# Patient Record
Sex: Female | Born: 1955 | Race: Black or African American | Hispanic: No | Marital: Single | Smoking: Former smoker
Health system: Southern US, Community
[De-identification: ages and names within clinical notes are randomized; demographics above are authoritative.]

## PROBLEM LIST (undated history)

## (undated) DIAGNOSIS — E119 Type 2 diabetes mellitus without complications: Secondary | ICD-10-CM

## (undated) DIAGNOSIS — I639 Cerebral infarction, unspecified: Secondary | ICD-10-CM

## (undated) DIAGNOSIS — D649 Anemia, unspecified: Secondary | ICD-10-CM

## (undated) DIAGNOSIS — I10 Essential (primary) hypertension: Secondary | ICD-10-CM

## (undated) HISTORY — PX: KNEE SURGERY: SHX244

## (undated) HISTORY — PX: OTHER SURGICAL HISTORY: SHX169

---

## 2001-09-30 ENCOUNTER — Emergency Department (HOSPITAL_COMMUNITY): Admission: EM | Admit: 2001-09-30 | Discharge: 2001-09-30 | Payer: Self-pay | Admitting: Emergency Medicine

## 2002-03-18 ENCOUNTER — Ambulatory Visit (HOSPITAL_COMMUNITY): Admission: RE | Admit: 2002-03-18 | Discharge: 2002-03-18 | Payer: Self-pay | Admitting: Family Medicine

## 2002-03-18 ENCOUNTER — Encounter: Payer: Self-pay | Admitting: Family Medicine

## 2004-01-31 ENCOUNTER — Emergency Department (HOSPITAL_COMMUNITY): Admission: EM | Admit: 2004-01-31 | Discharge: 2004-01-31 | Payer: Self-pay | Admitting: Emergency Medicine

## 2004-03-17 ENCOUNTER — Emergency Department (HOSPITAL_COMMUNITY): Admission: EM | Admit: 2004-03-17 | Discharge: 2004-03-17 | Payer: Self-pay | Admitting: Emergency Medicine

## 2004-08-01 ENCOUNTER — Emergency Department (HOSPITAL_COMMUNITY): Admission: EM | Admit: 2004-08-01 | Discharge: 2004-08-01 | Payer: Self-pay | Admitting: Emergency Medicine

## 2004-12-26 ENCOUNTER — Emergency Department (HOSPITAL_COMMUNITY): Admission: EM | Admit: 2004-12-26 | Discharge: 2004-12-26 | Payer: Self-pay | Admitting: *Deleted

## 2005-05-08 ENCOUNTER — Inpatient Hospital Stay (HOSPITAL_COMMUNITY): Admission: EM | Admit: 2005-05-08 | Discharge: 2005-05-10 | Payer: Self-pay | Admitting: Emergency Medicine

## 2006-12-09 ENCOUNTER — Emergency Department (HOSPITAL_COMMUNITY): Admission: EM | Admit: 2006-12-09 | Discharge: 2006-12-09 | Payer: Self-pay | Admitting: Emergency Medicine

## 2010-11-05 ENCOUNTER — Encounter: Payer: Self-pay | Admitting: Internal Medicine

## 2011-07-27 ENCOUNTER — Encounter: Payer: Self-pay | Admitting: *Deleted

## 2011-07-27 ENCOUNTER — Emergency Department (HOSPITAL_COMMUNITY): Payer: Self-pay

## 2011-07-27 ENCOUNTER — Emergency Department (HOSPITAL_COMMUNITY)
Admission: EM | Admit: 2011-07-27 | Discharge: 2011-07-27 | Disposition: A | Discharge status: Home / Self Care | Payer: Self-pay | Attending: Emergency Medicine | Admitting: Emergency Medicine

## 2011-07-27 DIAGNOSIS — I10 Essential (primary) hypertension: Secondary | ICD-10-CM | POA: Insufficient documentation

## 2011-07-27 DIAGNOSIS — F17200 Nicotine dependence, unspecified, uncomplicated: Secondary | ICD-10-CM | POA: Insufficient documentation

## 2011-07-27 DIAGNOSIS — M25569 Pain in unspecified knee: Secondary | ICD-10-CM | POA: Insufficient documentation

## 2011-07-27 DIAGNOSIS — M1990 Unspecified osteoarthritis, unspecified site: Secondary | ICD-10-CM

## 2011-07-27 DIAGNOSIS — M1710 Unilateral primary osteoarthritis, unspecified knee: Secondary | ICD-10-CM | POA: Insufficient documentation

## 2011-07-27 DIAGNOSIS — M25469 Effusion, unspecified knee: Secondary | ICD-10-CM | POA: Insufficient documentation

## 2011-07-27 HISTORY — DX: Anemia, unspecified: D64.9

## 2011-07-27 HISTORY — DX: Essential (primary) hypertension: I10

## 2011-07-27 MED ORDER — OXYCODONE-ACETAMINOPHEN 5-325 MG PO TABS: 1.0000 | ORAL_TABLET | ORAL | Status: AC | PRN

## 2011-07-27 MED ORDER — PREDNISONE (PAK) 10 MG PO TABS: ORAL_TABLET | ORAL | Status: AC

## 2011-07-27 NOTE — ED Provider Notes (Signed)
History   This chart was scribed for Flint Melter, MD by Clarita Crane. The patient was seen in room APA17/APA17 and the patient's care was started at 2:46PM.   CSN: [id number] Arrival date & time: 07/27/2011 12:57 PM  Chief Complaint  Patient presents with  . Knee Pain    right    (Consider location/radiation/quality/duration/timing/severity/associated sxs/prior treatment) HPI Brandi [name] is a 55 y.o. female who presents to the Emergency Department complaining of constant right knee pain to lateral aspect of knee onset 4 days ago and persistent since. Patient also notes she has episodes in which her "knee cap comes out of place" and her knee will suddenly "catch" with immediate onset of pain. Patient reports h/o right knee surgery performed by Dr. Hilda Lias several years ago. Patient also with h/o anemia and hypertension.    Past Medical History  Diagnosis Date  . Anemia   . Hypertension     Past Surgical History  Procedure Date  . Knee surgery right knee  . Cyst removed from right breast     History reviewed. No pertinent family history.  History  Substance Use Topics  . Smoking status: Current Everyday Smoker  . Smokeless tobacco: Not on file  . Alcohol Use: Yes    OB History    Grav Para Term Preterm Abortions TAB SAB Ect Mult Living                  Review of Systems 10 Systems reviewed and are negative for acute change except as noted in the HPI.  Allergies  Codeine  Home Medications   Current Outpatient Rx  Name Route Sig Dispense Refill  . HYDROGEN PEROXIDE 3 % EX SOLN Both Ears Place 1 application into both ears as needed. For itching     . OXYCODONE-ACETAMINOPHEN 5-325 MG PO TABS Oral Take 1 tablet by mouth every 4 (four) hours as needed for pain. 20 tablet 0  . PREDNISONE (PAK) 10 MG PO TABS  Take in decreasing daily dose 6, 5, 4, 3, 2, 1 21 tablet 0    BP 197/110  Pulse 87  Temp(Src) 98.6 F (37 C) (Oral)  Resp 20  Ht 5' 6.5" (1.689  m)  Wt 267 lb (121.11 kg)  BMI 42.45 kg/m2  SpO2 100%  Physical Exam  Nursing note and vitals reviewed. Constitutional: She is oriented to person, place, and time. She appears well-developed and well-nourished. No distress.  HENT:  Head: Normocephalic and atraumatic.  Eyes: EOM are normal. Pupils are equal, round, and reactive to light.  Neck: Neck supple. No tracheal deviation present.  Cardiovascular: Normal rate.   Pulmonary/Chest: Effort normal. No respiratory distress.  Abdominal: She exhibits no distension.  Musculoskeletal: Normal range of motion. She exhibits tenderness. She exhibits no edema.       Small effusion of right knee with mild diffuse swelling. Tenderness to palpation over lateral aspect of right knee. Right knee stable.   Neurological: She is alert and oriented to person, place, and time. No sensory deficit.  Skin: Skin is warm and dry.  Psychiatric: She has a normal mood and affect. Her behavior is normal.    ED Course  Procedures (including critical care time)  DIAGNOSTIC STUDIES: Oxygen Saturation is 100% on room air, normal by my interpretation.    COORDINATION OF CARE: 2:50PM- Patient informed of imaging results and probable cause of right knee pain. Intent to d/c home. Patient agrees with plan set forth at this time.  Labs Reviewed - No data to display Dg Knee Complete 4 Views Right  07/27/2011  *RADIOLOGY REPORT*  Clinical Data: Knee pain  RIGHT KNEE - COMPLETE 4+ VIEW  Comparison: None.  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 Laterally there is loss of joint space, sclerosis, and considerable spur formation.  There does appear to be a small knee joint effusion present.  No fracture is seen.  No definite loose body is noted.  IMPRESSION: Tricompartmental degenerative joint disease.  Probable small effusion.  No acute fracture.  Original Report Authenticated By: Juline Patch, M.D.        MDM  Nonspecific knee pain with arthritis. Stable for d/c.      I personally performed the services described in this documentation, which was scribed in my presence. The recorded information has been reviewed and considered.     Flint Melter, MD 07/27/11 1723

## 2011-07-27 NOTE — ED Notes (Signed)
Pt c/o right knee pain x 5 days; pt states her "knee keeps slipping"

## 2011-07-27 NOTE — ED Notes (Signed)
Knee Immobilizer placed on right knee. Pt a/ox4. Resp even and unlabored. NAD at this time. Pt escorted to lobby via w/c. Pt waiting on friend for transport home.

## 2011-10-16 DIAGNOSIS — I639 Cerebral infarction, unspecified: Secondary | ICD-10-CM

## 2011-10-16 HISTORY — DX: Cerebral infarction, unspecified: I63.9

## 2012-02-04 ENCOUNTER — Emergency Department (HOSPITAL_COMMUNITY)
Admission: EM | Admit: 2012-02-04 | Discharge: 2012-02-04 | Disposition: A | Discharge status: Home / Self Care | Payer: Medicaid Other | Attending: Emergency Medicine | Admitting: Emergency Medicine

## 2012-02-04 ENCOUNTER — Encounter (HOSPITAL_COMMUNITY): Payer: Self-pay | Admitting: *Deleted

## 2012-02-04 DIAGNOSIS — R062 Wheezing: Secondary | ICD-10-CM

## 2012-02-04 DIAGNOSIS — J45909 Unspecified asthma, uncomplicated: Secondary | ICD-10-CM | POA: Insufficient documentation

## 2012-02-04 DIAGNOSIS — F17200 Nicotine dependence, unspecified, uncomplicated: Secondary | ICD-10-CM | POA: Insufficient documentation

## 2012-02-04 DIAGNOSIS — I10 Essential (primary) hypertension: Secondary | ICD-10-CM

## 2012-02-04 DIAGNOSIS — M25569 Pain in unspecified knee: Secondary | ICD-10-CM

## 2012-02-04 DIAGNOSIS — R0602 Shortness of breath: Secondary | ICD-10-CM | POA: Insufficient documentation

## 2012-02-04 DIAGNOSIS — R059 Cough, unspecified: Secondary | ICD-10-CM | POA: Insufficient documentation

## 2012-02-04 DIAGNOSIS — R05 Cough: Secondary | ICD-10-CM | POA: Insufficient documentation

## 2012-02-04 MED ORDER — METOPROLOL TARTRATE 100 MG PO TABS: 100.0000 mg | ORAL_TABLET | Freq: Two times a day (BID) | ORAL | Status: DC

## 2012-02-04 MED ORDER — ALBUTEROL SULFATE HFA 108 (90 BASE) MCG/ACT IN AERS: 1.0000 | INHALATION_SPRAY | Freq: Four times a day (QID) | RESPIRATORY_TRACT | Status: DC | PRN

## 2012-02-04 MED ORDER — SODIUM CHLORIDE 0.9 % IN NEBU
INHALATION_SOLUTION | RESPIRATORY_TRACT | Status: AC
  Administered 2012-02-04: 3 mL
  Filled 2012-02-04: qty 3

## 2012-02-04 MED ORDER — ALBUTEROL SULFATE (5 MG/ML) 0.5% IN NEBU
5.0000 mg | INHALATION_SOLUTION | Freq: Once | RESPIRATORY_TRACT | Status: AC
  Administered 2012-02-04: 5 mg via RESPIRATORY_TRACT
  Filled 2012-02-04: qty 1

## 2012-02-04 MED ORDER — HYDROCHLOROTHIAZIDE 25 MG PO TABS: 25.0000 mg | ORAL_TABLET | Freq: Every day | ORAL | Status: DC

## 2012-02-04 MED ORDER — PREDNISONE 10 MG PO TABS: 20.0000 mg | ORAL_TABLET | Freq: Every day | ORAL | Status: DC

## 2012-02-04 MED ORDER — PREDNISONE 20 MG PO TABS
60.0000 mg | ORAL_TABLET | Freq: Once | ORAL | Status: AC
  Administered 2012-02-04: 60 mg via ORAL
  Filled 2012-02-04: qty 3

## 2012-02-04 MED ORDER — HYDROCODONE-ACETAMINOPHEN 5-325 MG PO TABS: 1.0000 | ORAL_TABLET | ORAL | Status: AC | PRN

## 2012-02-04 NOTE — ED Notes (Signed)
Pt. Discharged to home via w/c, NAD noted, resp. Even and regular

## 2012-02-04 NOTE — ED Provider Notes (Signed)
History   This chart was scribed for [employer]. Colon Branch, MD by Brooks Sailors. The patient was seen in room APA18/APA18. Patient's care was started at 0721.   CSN: [id number]  Arrival date & time 02/04/12  4696   First MD Initiated Contact with Patient 02/04/12 [phone]      Chief Complaint  Patient presents with  . Cough  . Joint Swelling    right knee  . Wheezing    (Consider location/radiation/quality/duration/timing/severity/associated sxs/prior treatment) HPI  Brandi [name] is a 56 y.o. female who presents to the Emergency Department complaining of constant moderate to severe SOB with onset one week ago with associated wheezing, congestion, and non-productive cough. Patient notes symptoms were resolving until severe episode of SOB and wheezing yesterday due to pollen. Patient with history of bronchitis and current everyday smoker. Patient also notes she has chronic high blood pressure and right knee pain. Patient says right knee is loose and cannot ambulate well.    Past Medical History  Diagnosis Date  . Anemia   . Hypertension     Past Surgical History  Procedure Date  . Knee surgery right knee  . Cyst removed from right breast     History reviewed. No pertinent family history.  History  Substance Use Topics  . Smoking status: Current Everyday Smoker  . Smokeless tobacco: Not on file  . Alcohol Use: Yes    OB History    Grav Para Term Preterm Abortions TAB SAB Ect Mult Living                  Review of Systems  All other systems reviewed and are negative.    Allergies  Codeine  Home Medications   Current Outpatient Rx  Name Route Sig Dispense Refill  . HYDROGEN PEROXIDE 3 % EX SOLN Both Ears Place 1 application into both ears as needed. For itching       BP 222/106  Pulse 88  Temp(Src) 98.3 F (36.8 C) (Oral)  Resp 20  Ht 5\' 6"  (1.676 m)  Wt 275 lb (124.739 kg)  BMI 44.39 kg/m2  SpO2 98%  Physical Exam  Nursing note and vitals  reviewed. Constitutional: She is oriented to person, place, and time. She appears well-developed and well-nourished.       obese  HENT:  Head: Normocephalic and atraumatic.  Mouth/Throat: Oropharynx is clear and moist.  Eyes: Conjunctivae and EOM are normal. Pupils are equal, round, and reactive to light.  Neck: Normal range of motion. Neck supple.  Cardiovascular: Normal rate, regular rhythm and normal heart sounds.   Pulmonary/Chest: Effort normal. No respiratory distress. She has wheezes (bilateral, occasional.). She has no rales.  Abdominal: Soft. Bowel sounds are normal.  Musculoskeletal: Normal range of motion.       History consistent right with patellar laxity.   Neurological: She is alert and oriented to person, place, and time.  Skin: Skin is warm and dry.  Psychiatric: She has a normal mood and affect.    ED Course  Procedures (including critical care time) DIAGNOSTIC STUDIES: Oxygen Saturation is 98% on room air, normal by my interpretation.     Date: 02/04/2012  0800  Rate: 80  Rhythm: normal sinus rhythm  QRS Axis: normal  Intervals: normal  ST/T Wave abnormalities: nonspecific ST/T changes  Conduction Disutrbances:none  Narrative Interpretation:   Old EKG Reviewed: none available  COORDINATION OF CARE: 8:10AM Patient being giving prednisone and breathing treatment, also inhaler. Patient given contact  for orthopedist for right knee.  9:00AM Patient notes feeling much better after breathing treatment.     MDM  Patient with history of asthma here with shortness of breath in the last 2 days that coincides with an increase in pollen. She denies fever or chills or productive cough. She also has c/o hypertension and right knee pain. She does not have a primary care doctor. Will provide beginning antihypertensives, inhaler, prednisone and analgesic. Pt feels improved after observation and/or treatment in ED.Pt stable in ED with no significant deterioration in  condition.The patient appears reasonably screened and/or stabilized for discharge and I doubt any other medical condition or other [facility] requiring further screening, evaluation, or treatment in the ED at this time prior to discharge.  I personally performed the services described in this documentation, which was scribed in my presence. The recorded information has been reviewed and considered.   MDM Reviewed: nursing note and vitals Interpretation: ECG           Nicoletta Dress. Colon Branch, MD 02/04/12 [phone]

## 2012-02-04 NOTE — ED Notes (Signed)
Received pt. From waiting room via w/c, pt. Alert and oriented, C/O SOB, Wheezing, Cough and right knee pain

## 2012-02-04 NOTE — Discharge Instructions (Signed)
Find a primary care doctor. Use the pain medicine for your knee. Follow up with an orthopedist if needed. One is listed on the paperwork for you. For the wheezing, use the inhaler as directed. Take all of the prednisone. Take the blood pressure medicine as directed.

## 2012-02-04 NOTE — ED Notes (Signed)
Resp. Therapist at the bedside, Neb Tx. given

## 2012-06-23 ENCOUNTER — Inpatient Hospital Stay (HOSPITAL_COMMUNITY)
Admission: EM | Admit: 2012-06-23 | Discharge: 2012-06-25 | DRG: 065 | Disposition: A | Discharge status: Home / Self Care | Payer: Self-pay | Attending: Internal Medicine | Admitting: Internal Medicine

## 2012-06-23 ENCOUNTER — Encounter (HOSPITAL_COMMUNITY): Payer: Self-pay | Admitting: *Deleted

## 2012-06-23 ENCOUNTER — Emergency Department (HOSPITAL_COMMUNITY): Payer: Self-pay

## 2012-06-23 ENCOUNTER — Inpatient Hospital Stay (HOSPITAL_COMMUNITY): Payer: Self-pay

## 2012-06-23 DIAGNOSIS — E669 Obesity, unspecified: Secondary | ICD-10-CM | POA: Diagnosis present

## 2012-06-23 DIAGNOSIS — Z23 Encounter for immunization: Secondary | ICD-10-CM

## 2012-06-23 DIAGNOSIS — I639 Cerebral infarction, unspecified: Secondary | ICD-10-CM | POA: Diagnosis present

## 2012-06-23 DIAGNOSIS — D649 Anemia, unspecified: Secondary | ICD-10-CM | POA: Diagnosis present

## 2012-06-23 DIAGNOSIS — Z6841 Body Mass Index (BMI) 40.0 and over, adult: Secondary | ICD-10-CM

## 2012-06-23 DIAGNOSIS — I6350 Cerebral infarction due to unspecified occlusion or stenosis of unspecified cerebral artery: Principal | ICD-10-CM

## 2012-06-23 DIAGNOSIS — R4701 Aphasia: Secondary | ICD-10-CM | POA: Diagnosis present

## 2012-06-23 DIAGNOSIS — F17200 Nicotine dependence, unspecified, uncomplicated: Secondary | ICD-10-CM | POA: Diagnosis present

## 2012-06-23 DIAGNOSIS — Z720 Tobacco use: Secondary | ICD-10-CM | POA: Diagnosis present

## 2012-06-23 DIAGNOSIS — I10 Essential (primary) hypertension: Secondary | ICD-10-CM | POA: Diagnosis present

## 2012-06-23 DIAGNOSIS — Z79899 Other long term (current) drug therapy: Secondary | ICD-10-CM

## 2012-06-23 LAB — CBC WITH DIFFERENTIAL/PLATELET
Basophils Absolute: 0 10*3/uL (ref 0.0–0.1)
Eosinophils Absolute: 0 10*3/uL (ref 0.0–0.7)
Eosinophils Relative: 1 % (ref 0–5)
MCH: 28.8 pg (ref 26.0–34.0)
MCV: 84.7 fL (ref 78.0–100.0)
Platelets: 301 10*3/uL (ref 150–400)
RDW: 15.7 % — ABNORMAL HIGH (ref 11.5–15.5)
WBC: 6.2 10*3/uL (ref 4.0–10.5)

## 2012-06-23 LAB — BASIC METABOLIC PANEL
Calcium: 9.9 mg/dL (ref 8.4–10.5)
GFR calc Af Amer: 70 mL/min — ABNORMAL LOW (ref >90)
GFR calc non Af Amer: 60 mL/min — ABNORMAL LOW (ref >90)
Sodium: 136 mEq/L (ref 135–145)

## 2012-06-23 MED ORDER — ASPIRIN 325 MG PO TABS
325.0000 mg | ORAL_TABLET | Freq: Every day | ORAL | Status: DC
  Administered 2012-06-24 – 2012-06-25 (×2): 325 mg via ORAL
  Filled 2012-06-23 (×2): qty 1

## 2012-06-23 MED ORDER — ASPIRIN 81 MG PO CHEW
324.0000 mg | CHEWABLE_TABLET | Freq: Once | ORAL | Status: AC
  Administered 2012-06-23: 324 mg via ORAL
  Filled 2012-06-23: qty 4

## 2012-06-23 MED ORDER — ACETAMINOPHEN 325 MG PO TABS: 650.0000 mg | ORAL_TABLET | ORAL | Status: DC | PRN

## 2012-06-23 MED ORDER — PNEUMOCOCCAL VAC POLYVALENT 25 MCG/0.5ML IJ INJ
0.5000 mL | INJECTION | INTRAMUSCULAR | Status: AC
  Administered 2012-06-24: 0.5 mL via INTRAMUSCULAR
  Filled 2012-06-23: qty 0.5

## 2012-06-23 MED ORDER — ONDANSETRON HCL 4 MG/2ML IJ SOLN: 4.0000 mg | Freq: Four times a day (QID) | INTRAMUSCULAR | Status: DC | PRN

## 2012-06-23 MED ORDER — ENOXAPARIN SODIUM 40 MG/0.4ML ~~LOC~~ SOLN: 40.0000 mg | SUBCUTANEOUS | Status: DC

## 2012-06-23 MED ORDER — SODIUM CHLORIDE 0.9 % IJ SOLN
INTRAMUSCULAR | Status: AC
  Administered 2012-06-23: 10 mL
  Filled 2012-06-23: qty 3

## 2012-06-23 MED ORDER — SODIUM CHLORIDE 0.9 % IV SOLN
INTRAVENOUS | Status: DC
  Administered 2012-06-23 – 2012-06-24 (×2): via INTRAVENOUS

## 2012-06-23 MED ORDER — METOPROLOL TARTRATE 50 MG PO TABS
100.0000 mg | ORAL_TABLET | Freq: Two times a day (BID) | ORAL | Status: DC
  Administered 2012-06-23 – 2012-06-25 (×4): 100 mg via ORAL
  Filled 2012-06-23 (×4): qty 2

## 2012-06-23 MED ORDER — HYDROCHLOROTHIAZIDE 25 MG PO TABS
25.0000 mg | ORAL_TABLET | Freq: Every day | ORAL | Status: DC
  Administered 2012-06-24 – 2012-06-25 (×2): 25 mg via ORAL
  Filled 2012-06-23 (×2): qty 1

## 2012-06-23 MED ORDER — ASPIRIN 300 MG RE SUPP
300.0000 mg | Freq: Every day | RECTAL | Status: DC
  Filled 2012-06-23 (×5): qty 1

## 2012-06-23 MED ORDER — SENNOSIDES-DOCUSATE SODIUM 8.6-50 MG PO TABS: 1.0000 | ORAL_TABLET | Freq: Every evening | ORAL | Status: DC | PRN

## 2012-06-23 MED ORDER — ALBUTEROL SULFATE HFA 108 (90 BASE) MCG/ACT IN AERS: 1.0000 | INHALATION_SPRAY | Freq: Four times a day (QID) | RESPIRATORY_TRACT | Status: DC | PRN

## 2012-06-23 MED ORDER — ACETAMINOPHEN 650 MG RE SUPP: 650.0000 mg | RECTAL | Status: DC | PRN

## 2012-06-23 MED ORDER — ENOXAPARIN SODIUM 40 MG/0.4ML ~~LOC~~ SOLN
40.0000 mg | SUBCUTANEOUS | Status: DC
  Administered 2012-06-23 – 2012-06-24 (×2): 40 mg via SUBCUTANEOUS
  Filled 2012-06-23 (×2): qty 0.4

## 2012-06-23 NOTE — H&P (Signed)
[facility] History and Physical  Brandi [name] WUJ:[id number] DOB: Apr 12, 1956 DOA: 06/23/2012  Referring physician: Dr. Bebe Shaggy PCP: No primary provider on file.   Chief Complaint: difficulty forming sentences  HPI: Brandi [name] is a 56 y.o. female  History of hypertension, obesity, tobacco abuse who was brought to the emergency room by her family with difficulty speech. According to her family they had noticed approximately 4 days ago the patient was having difficulty forming her sentences. He also had noticed some slurring of her speech. Over the past 4 days her symptoms had started to improve. Today a family member/friend had commented that patient may be having a mild facial droop. This prompted the patient's ER visit. She denies any unilateral weakness or numbness. No changes in vision. No chest pain or shortness of breath. No fever or cough. Her main complaint is expressive aphasic. She is an active smoker. She does not take any antiplatelet agents. She does not have a regular primary care physician. In the ER she was evaluated MRI of the brain showed acute CVA. She's been referred for admission  Review of Systems: The patient denies anorexia, fever, weight loss,, vision loss, decreased hearing, hoarseness, chest pain, syncope, dyspnea on exertion, peripheral edema, balance deficits, hemoptysis, abdominal pain, melena, hematochezia, severe indigestion/heartburn, hematuria, incontinence, genital sores, muscle weakness, suspicious skin lesions, transient blindness, difficulty walking, depression, unusual weight change, abnormal bleeding, enlarged lymph nodes, angioedema, and breast masses.    Past Medical History  Diagnosis Date  . Anemia   . Hypertension    Past Surgical History  Procedure Date  . Knee surgery right knee  . Cyst removed from right breast    Social History:  reports that she has been smoking.  She does not have any smokeless tobacco history on file. She  reports that she drinks alcohol. She reports that she uses illicit drugs (Marijuana). Patient lives at home with family and is independent  Allergies  Allergen Reactions  . Codeine     Unknown     Family history: Mother had congestive heart failure in her 81s. Father had MI in his 54s. No history of premature strokes   Prior to Admission medications   Medication Sig Start Date End Date Taking? Authorizing Provider  albuterol (PROVENTIL HFA;VENTOLIN HFA) 108 (90 BASE) MCG/ACT inhaler Inhale 1-2 puffs into the lungs every 6 (six) hours as needed for wheezing. 02/04/12 02/03/13 Yes Nicoletta Dress. Colon Branch, MD  hydrochlorothiazide (HYDRODIURIL) 25 MG tablet Take 1 tablet (25 mg total) by mouth daily. 02/04/12 02/03/13 Yes Terry S. Colon Branch, MD  metoprolol (LOPRESSOR) 100 MG tablet Take 1 tablet (100 mg total) by mouth 2 (two) times daily. 02/04/12 02/03/13 Yes Terry S. Colon Branch, MD   Physical Exam: Filed Vitals:   06/23/12 1522 06/23/12 1654 06/23/12 1738 06/23/12 1755  BP: 134/85 177/102  164/84  Pulse: 67 71    Temp:   98.4 F (36.9 C) 98.4 F (36.9 C)  TempSrc:    Oral  Resp: 22 18  20   Height:      Weight:      SpO2: 96% 100%  99%     General:  No acute distress  Eyes: Pupils are equal round react to light and accommodation  ENT: No pharyngeal erythema, mucous members are moist  Neck: Supple  Cardiovascular: S1, S2, regular rate and rhythm  Respiratory: Clear to auscultation bilaterally  Abdomen: Soft, nontender, nondistended, bowel sounds are active  Skin: No visible rashes  Musculoskeletal: Deferred  Psychiatric: Normal affect, cooperative with exam  Neurologic: Cranial nerves II through XII are grossly intact, strength is 5 out of 5 in the upper and lower extremities. No visible facial asymmetry. Speech appears to be improving  Labs on Admission:  Basic Metabolic Panel:  Lab 06/23/12 1610  NA 136  K 3.4*  CL 95*  CO2 28  GLUCOSE 148*  BUN 15  CREATININE 1.02    CALCIUM 9.9  MG --  PHOS --   Liver Function Tests: No results found for this basename: AST:5,ALT:5,ALKPHOS:5,BILITOT:5,PROT:5,ALBUMIN:5 in the last 168 hours No results found for this basename: LIPASE:5,AMYLASE:5 in the last 168 hours No results found for this basename: AMMONIA:5 in the last 168 hours CBC:  Lab 06/23/12 1325  WBC 6.2  NEUTROABS 3.5  HGB 17.3*  HCT 50.9*  MCV 84.7  PLT 301   Cardiac Enzymes: No results found for this basename: CKTOTAL:5,CKMB:5,CKMBINDEX:5,TROPONINI:5 in the last 168 hours  BNP (last 3 results) No results found for this basename: PROBNP:3 in the last 8760 hours CBG: No results found for this basename: GLUCAP:5 in the last 168 hours  Radiological Exams on Admission: Ct Head Wo Contrast  06/23/2012  *RADIOLOGY REPORT*  Clinical Data: Slurred speech.  Facial droop.  Left leg tingling.  CT HEAD WITHOUT CONTRAST  Technique:  Contiguous axial images were obtained from the base of the skull through the vertex without contrast.  Comparison: None.  Findings: The brain does not show generalized atrophy.  The brainstem and cerebellum are unremarkable.  The cerebral hemispheres are normal except for focal low density in the left frontoparietal cortical and subcortical brain most consistent with an old infarction.  No identifiably acute infarction, mass lesion, hemorrhage, hydrocephalus or extra-axial collection.  No calvarial abnormality.  Sinuses, middle ears and mastoids are clear.  IMPRESSION: No identifiably acute infarction.  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   Original Report Authenticated By: Thomasenia Sales, M.D.    Mr Brain Wo Contrast  06/23/2012  *RADIOLOGY REPORT*  Clinical Data: Slurred speech.  Confusion.  Left lower extremity numbness.  Hypertension.  MRI HEAD WITHOUT CONTRAST  Technique:  Multiplanar, multiecho pulse sequences of the brain and surrounding structures were obtained according to standard  protocol without intravenous contrast.  Comparison: 06/23/2012 head CT.  No comparison MR.  Findings: The patient is claustrophobic.  Fast technique imaging had to be utilized.  T1-weighted axial sequence was not obtained.  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  This involves portions of the left frontal lobe, left parietal lobe, left opercular and sub insular region and left temporal lobe.  Remote infarct at the left frontal - parietal junction.  Small vessel disease type changes.  No intracranial hemorrhage.  Major intracranial vascular structures are patent.  Limited evaluation of left middle cerebral artery branches on the present technique. The patient may eventually benefit from MR angiogram circle Willis.  Mild exophthalmos.  No intracranial mass lesion detected on this unenhanced exam.  No hydrocephalus.  Right frontal calvarial 1.2 cm lesion may represent a hemangioma. Stability can be confirmed on follow-up.  IMPRESSION:  The patient is claustrophobic.  Fast technique imaging had to be utilized.  T1-weighted axial sequence was not obtained.  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  This involves portions of the left frontal lobe, left parietal lobe, left opercular and sub insular region and left temporal lobe.  Remote infarct at the left frontal - parietal junction.  Small vessel disease type changes.  No intracranial hemorrhage.  Major intracranial vascular structures are patent.  Limited evaluation of left middle cerebral artery branches on the present technique. The patient may eventually benefit from MR angiogram circle Willis.  Right frontal calvarial 1.2 cm lesion may represent a hemangioma. Stability can be confirmed on follow-up.  Critical Value/emergent results were called by telephone at the time of interpretation on 06/23/2012 at 5:00 p.m. to Dr. Bebe Shaggy, who verbally acknowledged these results.   Original Report Authenticated  By: Fuller Canada, M.D.     EKG: Independently reviewed. No acute ST or T changes  Assessment/Plan Principal Problem:  *CVA (cerebral vascular accident) Active Problems:  HTN (hypertension)  Obesity  Expressive aphasia  Tobacco abuse   1. Acute CVA. Patient will be admitted to a telemetry bed to complete her workup. She'll undergo MRA of the brain, carotid Dopplers and 2-D echocardiogram. We'll also check hemoglobin A1c and lipid panel. We'll request a neurology consultation. We'll start her on aspirin since she was not taking any antiplatelet agent prior to the stroke. She'll have physical therapy evaluations. She has been counseled regarding lifestyle changes to modify risk factors. 2. Hypertension. Continue metoprolol. Can add second agent if she's remains uncontrolled 3. Tobacco abuse. Counseled on the importance of cessation. She appears to understand. 4. Obesity. Counseled on lifestyle changes  Code Status: Full code Family Communication: Discussed with patient and multiple family members at the bedside Disposition Plan: Discharge home once workup is complete  Time spent: 55 minutes  MEMON,JEHANZEB [facility] Pager [phone]  If 7PM-7AM, please contact night-coverage [email or web address] Password [facility] 06/23/2012, 6:40 PM

## 2012-06-23 NOTE — ED Notes (Signed)
Report called to

## 2012-06-23 NOTE — ED Notes (Signed)
Returned to cardiac monitor; placed on O2 2L/min per [place].

## 2012-06-23 NOTE — ED Notes (Signed)
Pt given mouth swab for dry mouth, pt advised about NPO status due to possible stroke.

## 2012-06-23 NOTE — ED Notes (Signed)
Rt facial droop, lt leg 'tingles".  Onset on Thursday.  Alert,   "fumbling my words"

## 2012-06-23 NOTE — ED Provider Notes (Signed)
History      CSN: [id number]  Arrival date & time 06/23/12  1131   First MD Initiated Contact with Patient 06/23/12 1241      No chief complaint on file.   (Consider location/radiation/quality/duration/timing/severity/associated sxs/prior treatment) The history is provided by the patient. No language interpreter was used.  chief complaint slurred speech........ History of present illness: Slurred speech and fumbling for words since Thursday. Also some numbness and tingling in the left lower extremity. Review of systems positive for diaphoresis. No chest pain, headache, neck pain. Nothing makes symptoms better or worse. Severity is moderate.past medical history positive for hypertension  Past Medical History  Diagnosis Date  . Anemia   . Hypertension     Past Surgical History  Procedure Date  . Knee surgery right knee  . Cyst removed from right breast     History reviewed. No pertinent family history.  History  Substance Use Topics  . Smoking status: Current Everyday Smoker  . Smokeless tobacco: Not on file  . Alcohol Use: Yes    OB History    Grav Para Term Preterm Abortions TAB SAB Ect Mult Living                  Review of Systems  All other systems reviewed and are negative.    Allergies  Codeine  Home Medications   Current Outpatient Rx  Name Route Sig Dispense Refill  . ALBUTEROL SULFATE HFA 108 (90 BASE) MCG/ACT IN AERS Inhalation Inhale 1-2 puffs into the lungs every 6 (six) hours as needed for wheezing. 1 Inhaler 0  . HYDROCHLOROTHIAZIDE 25 MG PO TABS Oral Take 1 tablet (25 mg total) by mouth daily. 30 tablet 6  . METOPROLOL TARTRATE 100 MG PO TABS Oral Take 1 tablet (100 mg total) by mouth 2 (two) times daily. 60 tablet 6    BP 175/73  Pulse 70  Temp 98.2 F (36.8 C) (Oral)  Resp 18  Ht 5\' 6"  (1.676 m)  Wt 280 lb (127.007 kg)  BMI 45.19 kg/m2  SpO2 98%  Physical Exam  Nursing note and vitals reviewed. Constitutional: She is oriented to  person, place, and time. She appears well-developed and well-nourished.       Obese,  Slight delay in answering questions. Does not appear confused  HENT:  Head: Normocephalic and atraumatic.  Eyes: Conjunctivae and EOM are normal. Pupils are equal, round, and reactive to light.  Neck: Normal range of motion. Neck supple.  Cardiovascular: Normal rate, regular rhythm and normal heart sounds.   Pulmonary/Chest: Effort normal and breath sounds normal.  Abdominal: Soft. Bowel sounds are normal.  Musculoskeletal: Normal range of motion.  Neurological: She is alert and oriented to person, place, and time.       Moving all 4 extremities  Skin: Skin is warm and dry.  Psychiatric: She has a normal mood and affect.    ED Course  Procedures (including critical care time) DIAGNOSTIC STUDIES:  COORDINATION OF CARE:    Labs Reviewed - No data to display No results found.   No diagnosis found. Results for orders placed during the hospital encounter of 06/23/12  CBC WITH DIFFERENTIAL      Component Value Range   WBC 6.2  4.0 - 10.5 K/uL   RBC 6.01 (*) 3.87 - 5.11 MIL/uL   Hemoglobin 17.3 (*) 12.0 - 15.0 g/dL   HCT 78.2 (*) 95.6 - 21.3 %   MCV 84.7  78.0 - 100.0 fL  MCH 28.8  26.0 - 34.0 pg   MCHC 34.0  30.0 - 36.0 g/dL   RDW 16.1 (*) 09.6 - 04.5 %   Platelets 301  150 - 400 K/uL   Neutrophils Relative 56  43 - 77 %   Neutro Abs 3.5  1.7 - 7.7 K/uL   Lymphocytes Relative 34  12 - 46 %   Lymphs Abs 2.1  0.7 - 4.0 K/uL   Monocytes Relative 10  3 - 12 %   Monocytes Absolute 0.6  0.1 - 1.0 K/uL   Eosinophils Relative 1  0 - 5 %   Eosinophils Absolute 0.0  0.0 - 0.7 K/uL   Basophils Relative 0  0 - 1 %   Basophils Absolute 0.0  0.0 - 0.1 K/uL  BASIC METABOLIC PANEL      Component Value Range   Sodium 136  135 - 145 mEq/L   Potassium 3.4 (*) 3.5 - 5.1 mEq/L   Chloride 95 (*) 96 - 112 mEq/L   CO2 28  19 - 32 mEq/L   Glucose, Bld 148 (*) 70 - 99 mg/dL   BUN 15  6 - 23 mg/dL    Creatinine, Ser 4.09  0.50 - 1.10 mg/dL   Calcium 9.9  8.4 - 81.1 mg/dL   GFR calc non Af Amer 60 (*) >90 mL/min   GFR calc Af Amer 70 (*) >90 mL/min  Ct Head Wo Contrast  06/23/2012  *RADIOLOGY REPORT*  Clinical Data: Slurred speech.  Facial droop.  Left leg tingling.  CT HEAD WITHOUT CONTRAST  Technique:  Contiguous axial images were obtained from the base of the skull through the vertex without contrast.  Comparison: None.  Findings: The brain does not show generalized atrophy.  The brainstem and cerebellum are unremarkable.  The cerebral hemispheres are normal except for focal low density in the left frontoparietal cortical and subcortical brain most consistent with an old infarction.  No identifiably acute infarction, mass lesion, hemorrhage, hydrocephalus or extra-axial collection.  No calvarial abnormality.  Sinuses, middle ears and mastoids are clear.  IMPRESSION: No identifiably acute infarction.  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   Original Report Authenticated By: Thomasenia Sales, M.D.      MDM  CT scan shows old infarction in the left frontal parietal cortical and subcortical brain.  Will obtain MRI for further differentiation.  Discussed with Dr. Army Fossa, MD 06/23/12 [phone]

## 2012-06-23 NOTE — ED Notes (Addendum)
Reports left-sided facial drooping and slurred speech x 4 days.  C/o "sweating a lot".  Reports diarrhea x 3 episodes yesterday.  Hx of hypertension, but states, "I don't take my medicine like I'm supposed to.". Pt is alert, answers questions appropriately.  No slurred speech or facial drooping noted; denies pain.

## 2012-06-23 NOTE — ED Provider Notes (Addendum)
4:37 PM  i received patient in signout to f/u mri Currently in MRI Labs/ekg reviewed BP 134/85  Pulse 67  Temp 98.2 F (36.8 C) (Oral)  Resp 22  Ht 5\' 6"  (1.676 m)  Wt 280 lb (127.007 kg)  BMI 45.19 kg/m2  SpO2 96%  5:19 PM Mri results noted Pt tells me she has had difficulty speaking for several days Swallow screen ordered She has mild expressive aphasia Will admit [facility] ordered (after swallow screen)  tPA in stroke considered but not given due to:  Onset over 3-4.5hours     Joya Gaskins, MD 06/23/12 1720   Date: 06/23/2012  Rate: 60  Rhythm: normal sinus rhythm  QRS Axis: left  Intervals: normal  ST/T Wave abnormalities: inverted t waves  Conduction Disutrbances:none  Narrative Interpretation:   Old EKG Reviewed: unchanged    Joya Gaskins, MD 06/23/12 1758

## 2012-06-23 NOTE — Plan of Care (Signed)
Problem: Phase I Progression Outcomes Goal: Strict NPO til swallow screen done Outcome: Completed/Met Date Met:  06/23/12 passed

## 2012-06-24 ENCOUNTER — Inpatient Hospital Stay (HOSPITAL_COMMUNITY): Payer: Self-pay

## 2012-06-24 LAB — CBC
HCT: 49.5 % — ABNORMAL HIGH (ref 36.0–46.0)
Hemoglobin: 16.9 g/dL — ABNORMAL HIGH (ref 12.0–15.0)
MCH: 28.8 pg (ref 26.0–34.0)
MCV: 84.3 fL (ref 78.0–100.0)
RBC: 5.87 MIL/uL — ABNORMAL HIGH (ref 3.87–5.11)

## 2012-06-24 LAB — HEMOGLOBIN A1C
Hgb A1c MFr Bld: 5.7 % — ABNORMAL HIGH (ref <5.7)
Mean Plasma Glucose: 117 mg/dL — ABNORMAL HIGH (ref <117)

## 2012-06-24 LAB — BASIC METABOLIC PANEL
CO2: 26 mEq/L (ref 19–32)
Glucose, Bld: 111 mg/dL — ABNORMAL HIGH (ref 70–99)
Potassium: 3.1 mEq/L — ABNORMAL LOW (ref 3.5–5.1)
Sodium: 136 mEq/L (ref 135–145)

## 2012-06-24 LAB — LIPID PANEL: Cholesterol: 195 mg/dL (ref 0–200)

## 2012-06-24 LAB — TSH: TSH: 0.664 u[IU]/mL (ref 0.350–4.500)

## 2012-06-24 MED ORDER — ASPIRIN 325 MG PO TABS: 325.0000 mg | ORAL_TABLET | Freq: Every day | ORAL | Status: AC

## 2012-06-24 MED ORDER — CLOPIDOGREL BISULFATE 75 MG PO TABS
75.0000 mg | ORAL_TABLET | Freq: Every day | ORAL | Status: DC
  Administered 2012-06-24 – 2012-06-25 (×2): 75 mg via ORAL
  Filled 2012-06-24 (×2): qty 1

## 2012-06-24 MED ORDER — SIMVASTATIN 20 MG PO TABS
20.0000 mg | ORAL_TABLET | Freq: Every day | ORAL | Status: DC
  Administered 2012-06-24: 20 mg via ORAL
  Filled 2012-06-24: qty 1

## 2012-06-24 MED ORDER — POTASSIUM CHLORIDE CRYS ER 20 MEQ PO TBCR
40.0000 meq | EXTENDED_RELEASE_TABLET | Freq: Once | ORAL | Status: AC
  Administered 2012-06-24: 40 meq via ORAL
  Filled 2012-06-24: qty 2

## 2012-06-24 MED ORDER — METOPROLOL TARTRATE 100 MG PO TABS: 100.0000 mg | ORAL_TABLET | Freq: Two times a day (BID) | ORAL | Status: DC

## 2012-06-24 MED ORDER — SIMVASTATIN 20 MG PO TABS: 20.0000 mg | ORAL_TABLET | Freq: Every day | ORAL | Status: DC

## 2012-06-24 MED ORDER — LORAZEPAM 2 MG/ML IJ SOLN: 1.0000 mg | Freq: Once | INTRAMUSCULAR | Status: DC

## 2012-06-24 NOTE — Plan of Care (Signed)
Problem: Phase II Progression Outcomes Goal: Neuro status stablized/improved Outcome: Completed/Met Date Met:  06/24/12 Pt's speech now clear.

## 2012-06-24 NOTE — Progress Notes (Addendum)
[facility] PROGRESS NOTE  Brandi [name] ZOX:[id number] DOB: 07-05-1956 DOA: 06/23/2012 PCP: No primary provider on file.  Assessment/Plan: Principal Problem:  *CVA (cerebral vascular accident) Active Problems:  HTN (hypertension)  Obesity  Expressive aphasia  Tobacco abuse  1. Acute CVA. Patient is on aspirin therapy. Neurology input appreciated. Workup is currently in progress Will add statin for hyperlipidemia. Blood pressure better controlled. Patient was counseled extensively lifestyle changes. 2. Hypertension. Improved with outpatient regimen. 3. Hyperlipidemia. Patient has an elevated LDL and will be started on statin. 4. Disposition. Once workup is complete, patient can be discharged home.  Code Status: Full code Family Communication: Discussed with patient at bedside Disposition Plan: Discharge home once workup is complete   Brief narrative: This lady presents to the emergency room with expressive aphasia. She was having symptoms for about 3-4 days prior to admission. MRI of the brain in the emergency room revealed a thrombotic infarct. Patient was admitted for further treatments   Consultants:  Neurology  Procedures:  None  Antibiotics:  None  HPI/Subjective: Feeling better today, still feel she has a mild expressive bases. No other complaints  Objective: Filed Vitals:   06/23/12 2331 06/24/12 0122 06/24/12 0352 06/24/12 0501  BP: 153/82 116/64 130/82 149/68  Pulse: 55 64 62 59  Temp: 98 F (36.7 C) 97.7 F (36.5 C) 98 F (36.7 C) 97.4 F (36.3 C)  TempSrc: Oral Oral Oral Oral  Resp: 20 20 20 20   Height:      Weight:      SpO2: 96% 97% 96% 100%    Intake/Output Summary (Last 24 hours) at 06/24/12 1025 Last data filed at 06/24/12 0844  Gross per 24 hour  Intake 1082.5 ml  Output      0 ml  Net 1082.5 ml   Filed Weights   06/23/12 1136 06/23/12 2016  Weight: 127.007 kg (280 lb) 147.97 kg (326 lb 3.4 oz)    Exam:   General:  No  acute distress  Cardiovascular: S1, S2, regular rate and rhythm  Respiratory: Clear to auscultation bilaterally  Abdomen: Soft, nontender, nondistended, bowel sounds are active  Data Reviewed: Basic Metabolic Panel:  Lab 06/24/12 8119 06/23/12 1325  NA 136 136  K 3.1* 3.4*  CL 97 95*  CO2 26 28  GLUCOSE 111* 148*  BUN 15 15  CREATININE 1.00 1.02  CALCIUM 9.5 9.9  MG -- --  PHOS -- --   Liver Function Tests: No results found for this basename: AST:5,ALT:5,ALKPHOS:5,BILITOT:5,PROT:5,ALBUMIN:5 in the last 168 hours No results found for this basename: LIPASE:5,AMYLASE:5 in the last 168 hours No results found for this basename: AMMONIA:5 in the last 168 hours CBC:  Lab 06/24/12 0446 06/23/12 1325  WBC 6.3 6.2  NEUTROABS -- 3.5  HGB 16.9* 17.3*  HCT 49.5* 50.9*  MCV 84.3 84.7  PLT 284 301   Cardiac Enzymes: No results found for this basename: CKTOTAL:5,CKMB:5,CKMBINDEX:5,TROPONINI:5 in the last 168 hours BNP (last 3 results) No results found for this basename: PROBNP:3 in the last 8760 hours CBG: No results found for this basename: GLUCAP:5 in the last 168 hours  No results found for this or any previous visit (from the past 240 hour(s)).   Studies: Dg Chest 2 View  06/23/2012  *RADIOLOGY REPORT*  Clinical Data: Slurred speech, stroke, history of smoking  CHEST - 2 VIEW  Comparison: None.  Findings: The lungs are clear but hyperaerated.  Mediastinal contours appear normal.  The heart is within upper limits normal. There  are degenerative changes throughout the thoracic spine.  IMPRESSION: No active lung disease.  Hyperaeration.   Original Report Authenticated By: Juline Patch, M.D.    Ct Head Wo Contrast  06/23/2012  *RADIOLOGY REPORT*  Clinical Data: Slurred speech.  Facial droop.  Left leg tingling.  CT HEAD WITHOUT CONTRAST  Technique:  Contiguous axial images were obtained from the base of the skull through the vertex without contrast.  Comparison: None.  Findings: The  brain does not show generalized atrophy.  The brainstem and cerebellum are unremarkable.  The cerebral hemispheres are normal except for focal low density in the left frontoparietal cortical and subcortical brain most consistent with an old infarction.  No identifiably acute infarction, mass lesion, hemorrhage, hydrocephalus or extra-axial collection.  No calvarial abnormality.  Sinuses, middle ears and mastoids are clear.  IMPRESSION: No identifiably acute infarction.  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   Original Report Authenticated By: Thomasenia Sales, M.D.    Mr Brain Wo Contrast  06/23/2012  *RADIOLOGY REPORT*  Clinical Data: Slurred speech.  Confusion.  Left lower extremity numbness.  Hypertension.  MRI HEAD WITHOUT CONTRAST  Technique:  Multiplanar, multiecho pulse sequences of the brain and surrounding structures were obtained according to standard protocol without intravenous contrast.  Comparison: 06/23/2012 head CT.  No comparison MR.  Findings: The patient is claustrophobic.  Fast technique imaging had to be utilized.  T1-weighted axial sequence was not obtained.  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  This involves portions of the left frontal lobe, left parietal lobe, left opercular and sub insular region and left temporal lobe.  Remote infarct at the left frontal - parietal junction.  Small vessel disease type changes.  No intracranial hemorrhage.  Major intracranial vascular structures are patent.  Limited evaluation of left middle cerebral artery branches on the present technique. The patient may eventually benefit from MR angiogram circle Willis.  Mild exophthalmos.  No intracranial mass lesion detected on this unenhanced exam.  No hydrocephalus.  Right frontal calvarial 1.2 cm lesion may represent a hemangioma. Stability can be confirmed on follow-up.  IMPRESSION:  The patient is claustrophobic.  Fast technique  imaging had to be utilized.  T1-weighted axial sequence was not obtained.  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  This involves portions of the left frontal lobe, left parietal lobe, left opercular and sub insular region and left temporal lobe.  Remote infarct at the left frontal - parietal junction.  Small vessel disease type changes.  No intracranial hemorrhage.  Major intracranial vascular structures are patent.  Limited evaluation of left middle cerebral artery branches on the present technique. The patient may eventually benefit from MR angiogram circle Willis.  Right frontal calvarial 1.2 cm lesion may represent a hemangioma. Stability can be confirmed on follow-up.  Critical Value/emergent results were called by telephone at the time of interpretation on 06/23/2012 at 5:00 p.m. to Dr. Bebe Shaggy, who verbally acknowledged these results.   Original Report Authenticated By: Fuller Canada, M.D.     Scheduled Meds:   . aspirin  324 mg Oral Once  . aspirin  300 mg Rectal Daily   Or  . aspirin  325 mg Oral Daily  . enoxaparin  40 mg Subcutaneous Q24H  . hydrochlorothiazide  25 mg Oral Daily  . metoprolol  100 mg Oral BID  . pneumococcal 23 valent vaccine  0.5 mL Intramuscular Tomorrow-1000  . sodium chloride      .  DISCONTD: enoxaparin  40 mg Subcutaneous Q24H   Continuous Infusions:   . sodium chloride 75 mL/hr at 06/23/12 2155    Principal Problem:  *CVA (cerebral vascular accident) Active Problems:  HTN (hypertension)  Obesity  Expressive aphasia  Tobacco abuse    Time spent: 25 minutes    MEMON,JEHANZEB  [facility] Pager [phone]. If 7PM-7AM, please contact night-coverage at [email or web address], password [facility] 06/24/2012, 10:25 AM  LOS: 1 day       Addendum:  Discussed MRA results with Dr. Gerilyn Pilgrim.  Recommendations were to continue with aggressive medical management with dual antiplatelet therapy for 3 months followed by single  agent, statin and blood pressure control.  Plans were made for discharge tonight, but patient will not have a ride home until morning.  Will plan on discharge home in the morning.

## 2012-06-24 NOTE — Evaluation (Signed)
Occupational Therapy Evaluation Patient Details Name: Brandi [name] MRN: [id number] DOB: 1956/05/01 Today's Date: 06/24/2012 Time: 4098-1191 OT Time Calculation (min): 10 min  OT Assessment / Plan / Recommendation Clinical Impression  Patient is a 56 y/o female s/p CVA presenting to acute OT with all education complete. Patient is functioning at baseline and does not express any difficulties functionally. patient only reports difficulty with her speech. OT will sign off.    OT Assessment  Patient does not need any further OT services    Follow Up Recommendations  No OT follow up       Equipment Recommendations  None recommended by OT          Precautions / Restrictions Precautions Precautions: None   Pertinent Vitals/Pain No reports of pain.    ADL  Lower Body Dressing: Supervision/safety Where Assessed - Lower Body Dressing: Unsupported sitting      Visit Information  Last OT Received On: 06/24/12 Assistance Needed: +1    Subjective Data  Subjective: "My speech is mainly affected." Patient Stated Goal: To go home.   Prior Functioning  Vision/Perception  Home Living Lives With: Spouse;Family Available Help at Discharge: Family;Available 24 hours/day Type of Home: House Home Access: Stairs to enter [employer] of Steps: 5 Entrance Stairs-Rails: Right Home Layout: One level Bathroom Shower/Tub: Teacher, adult education: None Prior Function Level of Independence: Independent Able to Take Stairs?: Yes Driving: Yes Vocation: On disability Communication Communication: Expressive difficulties Dominant Hand: Right   Vision - Assessment Eye Alignment: Within Functional Limits Vision Assessment: Vision tested Ocular Range of Motion: Within Functional Limits Saccades: Within functional limits Perception Perception: Within Functional Limits  Cognition  Overall Cognitive Status: Appears within functional limits for tasks  assessed/performed Arousal/Alertness: Awake/alert Orientation Level: Appears intact for tasks assessed Behavior During Session: [facility] for tasks performed    Extremity/Trunk Assessment Right Upper Extremity Assessment RUE ROM/Strength/Tone: Within functional levels (MMT: 4/5) Left Upper Extremity Assessment LUE ROM/Strength/Tone: Within functional levels (MMT: 4/5)   Mobility    Transfers Transfers: Sit to Stand;Stand to Sit Sit to Stand: 7: Independent;With upper extremity assist Stand to Sit: 7: Independent;With upper extremity assist             End of Session OT - End of Session Activity Tolerance: Patient tolerated treatment well Patient left: in bed;with call bell/phone within reach;with family/visitor present   Limmie Patricia, OTR/L 06/24/2012, 3:21 PM

## 2012-06-24 NOTE — Clinical Social Work Note (Signed)
CSW received consult for advanced directives. Pt alert and oriented and had family member present in room with permission. She had an advanced directive packet in her room. CSW explained what this document is used for and she states it is not something she is interested in doing right now. She had a misunderstanding regarding what it is. No other needs reported at this time. CSW will sign off.  Derenda Fennel, [place] [phone]

## 2012-06-24 NOTE — Evaluation (Signed)
Speech Language Pathology Evaluation Patient Details Name: Brandi [name] MRN: [id number] DOB: 05/10/1956 Today's Date: 06/24/2012 Time: 6295-2841 SLP Time Calculation (min): 19 min  Problem List:  Patient Active Problem List  Diagnosis  . CVA (cerebral vascular accident)  . HTN (hypertension)  . Obesity  . Expressive aphasia  . Tobacco abuse   Past Medical History:  Past Medical History  Diagnosis Date  . Anemia   . Hypertension    Past Surgical History:  Past Surgical History  Procedure Date  . Knee surgery right knee  . Cyst removed from right breast    HPI:  Ms. Brandi [name] is a 56 yo woman who was admitted with 3 day history of expressive aphasia. MRI showed acute left MCA non hemorrhagic stroke involving the left frontal, parietal, and temporal lobe. She passed the RN swallow screen and has been tolerating a heart healthy diet.    Assessment / Plan / Recommendation Clinical Impression  Mild/moderate expressive aphasia characterized by word finding difficulties and halting speech with circumlocution. Pt will need out patient speech therapy with more comprehensive evaluation of language.    SLP Assessment  All further Speech Lanaguage Pathology  needs can be addressed in the next venue of care (Out patient)    Follow Up Recommendations  Outpatient SLP    Frequency and Duration  N/A            SLP Evaluation Prior Functioning  Type of Home: House Lives With: Spouse;Family Available Help at Discharge: Family;Available 24 hours/day Vocation: On disability   Cognition  Overall Cognitive Status: Appears within functional limits for tasks assessed Arousal/Alertness: Awake/alert Orientation Level: Oriented X4    Comprehension  Auditory Comprehension Overall Auditory Comprehension: Appears within functional limits for tasks assessed Yes/No Questions: Within Functional Limits Commands: Within Functional Limits Conversation: Simple Visual  Recognition/Discrimination Discrimination: Not tested Reading Comprehension Reading Status: Not tested    Expression Expression Primary Mode of Expression: Verbal Verbal Expression Overall Verbal Expression: Impaired Initiation: No impairment Automatic Speech: Name;Social Response Level of Generative/Spontaneous Verbalization: Conversation (fairly broken conversation) Naming: Impairment Responsive: 76-100% accurate Divergent: 75-100% accurate Pragmatics: No impairment Effective Techniques: Open ended questions;Articulatory cues Non-Verbal Means of Communication: Not applicable Other Verbal Expression Comments: word finding deficits with circumlocution and incomplete sentences Written Expression Dominant Hand: Right Written Expression: Not tested   Oral / Motor Oral Motor/Sensory Function Overall Oral Motor/Sensory Function: Appears within functional limits for tasks assessed Motor Speech Overall Motor Speech: Appears within functional limits for tasks assessed Respiration: Within functional limits Phonation: Normal Resonance: Within functional limits Articulation: Within functional limitis Intelligibility: Intelligible Motor Planning: Impaired Level of Impairment: Sentence Motor Speech Errors: Groping for words Effective Techniques: Slow rate       Thank you,  Havery Moros, CCC-SLP [phone]  PORTER,DABNEY 06/24/2012, 6:40 PM

## 2012-06-24 NOTE — Consult Note (Signed)
[facility] Ladawna Walgren A. Gerilyn Pilgrim, MD     [email or web address]        SUBJECTIVE:  The patient is a 56 year old black female who reports the acute onset of word finding problems a few days ago. She has been quite frustrated with her difficulties speaking. She seems to understand what she wants to say but has difficulties finding the words. She was noted to hav facial asymmetry was resulted in the patient being asked him to the emergency room by family members. She denies headaches or focal weakness. The patient also denies numbness. She reports that her speaking impairment has improved somewhat. She has a long history of hypertension. She previously has not been on antiplatelet agents.   OBJECTIVE: GENERAL: This very pleasant obese lady who is in no acute distress.  HEENT: Neck is supple. Head is normocephalic atraumatic. Poor dentition.  ABDOMEN: soft  EXTREMITIES: No edema   BACK: Unremarkable.  SKIN: Normal by inspection.    MENTAL STATUS: Alert and lucid. She has slightly reduced fluency and the some word finding difficulties. She actually good on naming When tested directly. She got 5 out of 5 objects right. Repetition is good.   CRANIAL NERVES: Pupils are equal, round and reactive to light and accomodation; extra ocular movements are full, there is no significant nystagmus; visual fields are full; upper and lower facial muscles are normal in strength and symmetric, there is no flattening of the nasolabial folds; tongue is midline; uvula is midline; shoulder elevation is normal.  MOTOR: Normal tone, bulk and strength; no pronator drift.  COORDINATION: Left finger to nose is normal, right finger to nose is normal, No rest tremor; no intention tremor; no postural tremor; no bradykinesia.  REFLEXES: Deep tendon reflexes are symmetrical and normal. Babinski reflexes are flexor bilaterally.   SENSATION: Normal to light touch, temperature, and pinprick.  GAIT: Normal.   The  patient's brain MRI is reviewed in person. There are multiple small hyperintensities seen on diffusion imaging involving the left frontal temporal and parietal area. The distribution approximates the MCA. There is corresponding decrease intensity seen on the ADC imaging. She has mild chronic white matter changes.  Telemetry shows normal sinus rhythm.  ASSESSMENT/PLAN:  1. Acute trouble infarct likely due to a thromboembolic phenomenon. However, given the embolic nature cardio- embolic phenomena she will also be considered. Echo should be obtained. The patient is on aspirin therapy. Lipid profile also is obtained and shows elevated LDL. A statin is therefore recommended. Carotid duplex Doppler is pending. Homocystine and thyroid function test is also suggested.  Anahla Bevis A. Gerilyn Pilgrim, M.D. Diplomate, Education officer, community and Neurology ( Neurology).

## 2012-06-24 NOTE — Evaluation (Signed)
Physical Therapy Evaluation Patient Details Name: Brandi [name] MRN: [id number] DOB: 10-07-1956 Today's Date: 06/24/2012 Time: 4742-5956 PT Time Calculation (min): 12 min  PT Assessment / Plan / Recommendation Clinical Impression  Pt is seen for eval and found to have no functional problems post stroke.  She does have an expressive aphasia, but is able to communicate.  No PT needs.    PT Assessment  Patent does not need any further PT services    Follow Up Recommendations  No PT follow up    Barriers to Discharge        Equipment Recommendations  None recommended by PT    Recommendations for Other Services Speech consult;OT consult   Frequency      Precautions / Restrictions Precautions Precautions: None Restrictions Weight Bearing Restrictions: No   Pertinent Vitals/Pain       Mobility  Bed Mobility Bed Mobility: Supine to Sit;Sit to Supine Supine to Sit: 7: Independent Sit to Supine: 7: Independent Transfers Transfers: Sit to Stand;Stand to Sit Sit to Stand: 7: Independent Stand to Sit: 7: Independent Ambulation/Gait Ambulation/Gait Assistance: 7: Independent Ambulation Distance (Feet): 200 Feet Assistive device: None Gait Pattern: Within Functional Limits Stairs: No Wheelchair Mobility Wheelchair Mobility: No    Exercises     PT Diagnosis:    PT Problem List:   PT Treatment Interventions:     PT Goals    Visit Information  Last PT Received On: 06/24/12    Subjective Data  Subjective: has trouble with word finding Patient Stated Goal: none stated   Prior Functioning  Home Living Lives With: Spouse Available Help at Discharge: Family;Available 24 hours/day Type of Home: House Home Access: Stairs to enter [employer] of Steps: 5 Entrance Stairs-Rails: Right Home Layout: One level Home Adaptive Equipment: None Prior Function Level of Independence: Independent Able to Take Stairs?: Yes Driving: Yes Vocation: On  disability Communication Communication: Expressive difficulties    Cognition  Overall Cognitive Status: Appears within functional limits for tasks assessed/performed Arousal/Alertness: Awake/alert Orientation Level: Appears intact for tasks assessed Behavior During Session: [facility] for tasks performed    Extremity/Trunk Assessment Right Lower Extremity Assessment RLE ROM/Strength/Tone: Within functional levels (genu valgus deformity) RLE Sensation: WFL - Light Touch;WFL - Proprioception RLE Coordination: WFL - gross motor Left Lower Extremity Assessment LLE ROM/Strength/Tone: Within functional levels (genu valgus deformity) LLE Sensation: WFL - Light Touch LLE Coordination: WFL - gross motor   Balance Balance Balance Assessed: Yes High Level Balance High Level Balance Activites: Backward walking;Direction changes;Turns;Sudden stops;Head turns High Level Balance Comments: no LOB with above  End of Session PT - End of Session Equipment Utilized During Treatment: Gait belt Activity Tolerance: Patient tolerated treatment well Patient left: in bed  GP     Myrlene Broker L 06/24/2012, 9:17 AM

## 2012-06-24 NOTE — Progress Notes (Signed)
*   Echocardiogram 2D Echocardiogram has been performed.  Antony Salmon, RCS 06/24/2012, 12:59 PM

## 2012-06-24 NOTE — Care Management Note (Signed)
    Page 1 of 1   06/25/2012     3:16:18 PM   CARE MANAGEMENT NOTE 06/25/2012  Patient:  Brandi [name]   Account Number:  [id number]  Date Initiated:  06/24/2012  Documentation initiated by:  Rosemary Holms  Subjective/Objective Assessment:   pt admitted from home. Presented with speech issues and DX w/ CVA. Meet with pt and family.     Action/Plan:   No HH needs identified at this meeting.   Anticipated DC Date:  06/25/2012   Anticipated DC Plan:  HOME/SELF CARE      DC Planning Services  CM consult      Choice offered to / List presented to:             Status of service:  Completed, signed off Medicare Important Message given?   (If response is "NO", the following Medicare IM given date fields will be blank) Date Medicare IM given:   Date Additional Medicare IM given:    Discharge Disposition:  HOME/SELF CARE  Per UR Regulation:    If discussed at Long Length of Stay Meetings, dates discussed:    Comments:  06/25/12 1100 Segundo Makela RN BSN CM Pt assisted with Plavix with the [employer] approved by Marlyn Corporal, Industrial/product designer. Spoke to pt about her options for PCP. Encouraged her to get established with the [facility] and alto told her and her SO about [facility] who accepts pt's w/out ins on [name] sliding scale. 06/24/12 1430 Amiah Frohlich Leward Quan BSN CM

## 2012-06-24 NOTE — Consult Note (Signed)
Reason for Consult: Referring Physician:   [name] is an 56 y.o. female.  HPI:   Past Medical History  Diagnosis Date  . Anemia   . Hypertension     Past Surgical History  Procedure Date  . Knee surgery right knee  . Cyst removed from right breast     History reviewed. No pertinent family history.  Social History:  reports that she has been smoking Cigarettes.  She has been smoking about .25 packs per day. She does not have any smokeless tobacco history on file. She reports that she drinks about .6 ounces of alcohol per week. She reports that she uses illicit drugs (Marijuana).  Allergies:  Allergies  Allergen Reactions  . Codeine     Unknown     Medications:  Prior to Admission medications   Medication Sig Start Date End Date Taking? Authorizing Provider  albuterol (PROVENTIL HFA;VENTOLIN HFA) 108 (90 BASE) MCG/ACT inhaler Inhale 1-2 puffs into the lungs every 6 (six) hours as needed for wheezing. 02/04/12 02/03/13 Yes Brandi [name]  hydrochlorothiazide (HYDRODIURIL) 25 MG tablet Take 1 tablet (25 mg total) by mouth daily. 02/04/12 02/03/13 Yes Brandi [name]  metoprolol (LOPRESSOR) 100 MG tablet Take 1 tablet (100 mg total) by mouth 2 (two) times daily. 02/04/12 02/03/13 Yes Brandi [name]    Scheduled Meds:   . aspirin  324 mg Oral Once  . aspirin  300 mg Rectal Daily   Or  . aspirin  325 mg Oral Daily  . enoxaparin  40 mg Subcutaneous Q24H  . hydrochlorothiazide  25 mg Oral Daily  . metoprolol  100 mg Oral BID  . pneumococcal 23 valent vaccine  0.5 mL Intramuscular Tomorrow-1000  . sodium chloride      . DISCONTD: enoxaparin  40 mg Subcutaneous Q24H   Continuous Infusions:   . sodium chloride 75 mL/hr at 06/23/12 2155   PRN Meds:.acetaminophen, acetaminophen, albuterol, ondansetron (ZOFRAN) IV, senna-docusate   Results for orders placed during the hospital encounter of 06/23/12 (from the past 48 hour(s))  CBC WITH DIFFERENTIAL      Status: Abnormal   Collection Time   06/23/12  1:25 PM      Component Value Range Comment   WBC 6.2  4.0 - 10.5 K/uL    RBC 6.01 (*) 3.87 - 5.11 MIL/uL    Hemoglobin 17.3 (*) 12.0 - 15.0 g/dL    HCT 40.9 (*) 81.1 - 46.0 %    MCV 84.7  78.0 - 100.0 fL    MCH 28.8  26.0 - 34.0 pg    MCHC 34.0  30.0 - 36.0 g/dL    RDW 91.4 (*) 78.2 - 15.5 %    Platelets 301  150 - 400 K/uL    Neutrophils Relative 56  43 - 77 %    Neutro Abs 3.5  1.7 - 7.7 K/uL    Lymphocytes Relative 34  12 - 46 %    Lymphs Abs 2.1  0.7 - 4.0 K/uL    Monocytes Relative 10  3 - 12 %    Monocytes Absolute 0.6  0.1 - 1.0 K/uL    Eosinophils Relative 1  0 - 5 %    Eosinophils Absolute 0.0  0.0 - 0.7 K/uL    Basophils Relative 0  0 - 1 %    Basophils Absolute 0.0  0.0 - 0.1 K/uL   BASIC METABOLIC PANEL     Status: Abnormal   Collection Time  06/23/12  1:25 PM      Component Value Range Comment   Sodium 136  135 - 145 mEq/L    Potassium 3.4 (*) 3.5 - 5.1 mEq/L    Chloride 95 (*) 96 - 112 mEq/L    CO2 28  19 - 32 mEq/L    Glucose, Bld 148 (*) 70 - 99 mg/dL    BUN 15  6 - 23 mg/dL    Creatinine, Ser 3.87  0.50 - 1.10 mg/dL    Calcium 9.9  8.4 - 56.4 mg/dL    GFR calc non Af Amer 60 (*) >90 mL/min    GFR calc Af Amer 70 (*) >90 mL/min   LIPID PANEL     Status: Abnormal   Collection Time   06/24/12  4:46 AM      Component Value Range Comment   Cholesterol 195  0 - 200 mg/dL    Triglycerides 332 (*) <150 mg/dL    HDL 41  >95 mg/dL    Total CHOL/HDL Ratio 4.8      VLDL 36  0 - 40 mg/dL    LDL Cholesterol 188 (*) 0 - 99 mg/dL   BASIC METABOLIC PANEL     Status: Abnormal   Collection Time   06/24/12  4:46 AM      Component Value Range Comment   Sodium 136  135 - 145 mEq/L    Potassium 3.1 (*) 3.5 - 5.1 mEq/L    Chloride 97  96 - 112 mEq/L    CO2 26  19 - 32 mEq/L    Glucose, Bld 111 (*) 70 - 99 mg/dL    BUN 15  6 - 23 mg/dL    Creatinine, Ser 4.16  0.50 - 1.10 mg/dL    Calcium 9.5  8.4 - 60.6 mg/dL    GFR calc  non Af Amer 62 (*) >90 mL/min    GFR calc Af Amer 72 (*) >90 mL/min   CBC     Status: Abnormal   Collection Time   06/24/12  4:46 AM      Component Value Range Comment   WBC 6.3  4.0 - 10.5 K/uL    RBC 5.87 (*) 3.87 - 5.11 MIL/uL    Hemoglobin 16.9 (*) 12.0 - 15.0 g/dL    HCT 30.1 (*) 60.1 - 46.0 %    MCV 84.3  78.0 - 100.0 fL    MCH 28.8  26.0 - 34.0 pg    MCHC 34.1  30.0 - 36.0 g/dL    RDW 09.3  23.5 - 57.3 %    Platelets 284  150 - 400 K/uL     Dg Chest 2 View  06/23/2012  *RADIOLOGY REPORT*  Clinical Data: Slurred speech, stroke, history of smoking  CHEST - 2 VIEW  Comparison: None.  Findings: The lungs are clear but hyperaerated.  Mediastinal contours appear normal.  The heart is within upper limits normal. There are degenerative changes throughout the thoracic spine.  IMPRESSION: No active lung disease.  Hyperaeration.   Original Report Authenticated By: Brandi Patch, M.D.    Ct Head Wo Contrast  06/23/2012  *RADIOLOGY REPORT*  Clinical Data: Slurred speech.  Facial droop.  Left leg tingling.  CT HEAD WITHOUT CONTRAST  Technique:  Contiguous axial images were obtained from the base of the skull through the vertex without contrast.  Comparison: None.  Findings: The brain does not show generalized atrophy.  The brainstem and cerebellum are unremarkable.  The cerebral  hemispheres are normal except for focal low density in the left frontoparietal cortical and subcortical brain most consistent with an old infarction.  No identifiably acute infarction, mass lesion, hemorrhage, hydrocephalus or extra-axial collection.  No calvarial abnormality.  Sinuses, middle ears and mastoids are clear.  IMPRESSION: No identifiably acute infarction.  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   Original Report Authenticated By: Brandi Sales, M.D.    Mr Brain Wo Contrast  06/23/2012  *RADIOLOGY REPORT*  Clinical Data: Slurred speech.  Confusion.  Left lower extremity  numbness.  Hypertension.  MRI HEAD WITHOUT CONTRAST  Technique:  Multiplanar, multiecho pulse sequences of the brain and surrounding structures were obtained according to standard protocol without intravenous contrast.  Comparison: 06/23/2012 head CT.  No comparison MR.  Findings: The patient is claustrophobic.  Fast technique imaging had to be utilized.  T1-weighted axial sequence was not obtained.  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  This involves portions of the left frontal lobe, left parietal lobe, left opercular and sub insular region and left temporal lobe.  Remote infarct at the left frontal - parietal junction.  Small vessel disease type changes.  No intracranial hemorrhage.  Major intracranial vascular structures are patent.  Limited evaluation of left middle cerebral artery branches on the present technique. The patient may eventually benefit from MR angiogram circle Willis.  Mild exophthalmos.  No intracranial mass lesion detected on this unenhanced exam.  No hydrocephalus.  Right frontal calvarial 1.2 cm lesion may represent a hemangioma. Stability can be confirmed on follow-up.  IMPRESSION:  The patient is claustrophobic.  Fast technique imaging had to be utilized.  T1-weighted axial sequence was not obtained.  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  This involves portions of the left frontal lobe, left parietal lobe, left opercular and sub insular region and left temporal lobe.  Remote infarct at the left frontal - parietal junction.  Small vessel disease type changes.  No intracranial hemorrhage.  Major intracranial vascular structures are patent.  Limited evaluation of left middle cerebral artery branches on the present technique. The patient may eventually benefit from MR angiogram circle Willis.  Right frontal calvarial 1.2 cm lesion may represent a hemangioma. Stability can be confirmed on follow-up.  Critical Value/emergent  results were called by telephone at the time of interpretation on 06/23/2012 at 5:00 p.m. to Dr. Bebe Shaggy, who verbally acknowledged these results.   Original Report Authenticated By: Fuller Canada, M.D.     Review of Systems  Constitutional: Negative.   HENT: Negative.   Respiratory: Negative.   Cardiovascular: Negative.   Gastrointestinal: Negative.   Genitourinary: Negative.   Musculoskeletal: Negative.   Skin: Negative.   Endo/Heme/Allergies: Negative.   Psychiatric/Behavioral: Negative.    Blood pressure 149/68, pulse 59, temperature 97.4 F (36.3 C), temperature source Oral, resp. rate 20, height 5\' 6"  (1.676 m), weight 147.97 kg (326 lb 3.4 oz), SpO2 100.00%. Physical Exam  Assessment/Plan: Also see dictation and orders.    Bird Tailor 06/24/2012, 8:39 AM

## 2012-06-25 MED ORDER — ACETAMINOPHEN 325 MG PO TABS
ORAL_TABLET | ORAL | Status: AC
  Filled 2012-06-25: qty 2

## 2012-06-25 MED ORDER — CLOPIDOGREL BISULFATE 75 MG PO TABS: 75.0000 mg | ORAL_TABLET | Freq: Every day | ORAL | Status: AC

## 2012-06-25 NOTE — Progress Notes (Signed)
[facility] Jalil Lorusso A. Gerilyn Pilgrim, MD     [email or web address]        SUBJECTIVE: She reports that she has had some moderate improvement in her speech.  OBJECTIVE:  GENERAL: This very pleasant obese lady who is in no acute distress.  HEENT: Neck is supple. Head is normocephalic atraumatic. Poor dentition.  ABDOMEN: soft  EXTREMITIES: No edema   BACK: Unremarkable.  SKIN: Normal by inspection.    MENTAL STATUS: Alert and lucid. She has slightly reduced fluency and the some word finding difficulties. She actually good on naming When tested directly. She got 5 out of 5 objects right. Repetition is good.   CRANIAL NERVES: Pupils are equal, round and reactive to light and accomodation; extra ocular movements are full, there is no significant nystagmus; visual fields are full; upper and lower facial muscles are normal in strength and symmetric, there is no flattening of the nasolabial folds; tongue is midline; uvula is midline; shoulder elevation is normal.  MOTOR: Normal tone, bulk and strength; no pronator drift.  COORDINATION: Left finger to nose is normal, right finger to nose is normal, No rest tremor; no intention tremor; no postural tremor; no bradykinesia.   Brain MRA shows high-grade 6/critical stenosis of the left MCA at the bifurcation. There is also some disease involving the contralateral MCA.  Echocardiography shows 60% ejection fraction with grade 1 diastolic dysfunction.  Carotid duplex Doppler shows no hemodynamic significant stenosis.  ASSESSMENT/PLAN:  1. 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 The patient will be placed on a combination of aspirin and Plavix for 3 months. Subsequent to this, she was placed on a single antiplatelet agent likely aspirin. She also should be placed on a statin. The patient can follow up in the office in about 4 weeks.  Aariz Maish A. Gerilyn Pilgrim, M.D. Diplomate, Education officer, community and Neurology (  Neurology).

## 2012-06-25 NOTE — Progress Notes (Signed)
Subjective: Interval History:   Objective: Vital signs in last 24 hours: Temp:  [97.7 F (36.5 C)-98.2 F (36.8 C)] 98.2 F (36.8 C) (09/11 0605) Pulse Rate:  [49-62] 52  (09/11 0605) Resp:  [18-20] 20  (09/11 0605) BP: (136-175)/(70-96) 149/70 mmHg (09/11 0605) SpO2:  [98 %-100 %] 99 % (09/11 0605) Weight:  [125.4 kg (276 lb 7.3 oz)] 125.4 kg (276 lb 7.3 oz) (09/11 0500)  Intake/Output from previous day: 09/10 0701 - 09/11 0700 In: 2352.5 [P.O.:700; I.V.:1652.5] Out: -  Intake/Output this shift:   Nutritional status: Cardiac    Lab Results:  Basename 06/24/12 0446 06/23/12 1325  WBC 6.3 6.2  HGB 16.9* 17.3*  HCT 49.5* 50.9*  PLT 284 301  NA 136 136  K 3.1* 3.4*  CL 97 95*  CO2 26 28  GLUCOSE 111* 148*  BUN 15 15  CREATININE 1.00 1.02  CALCIUM 9.5 9.9  LABA1C -- --   Lipid Panel  Basename 06/24/12 0446  CHOL 195  TRIG 180*  HDL 41  CHOLHDL 4.8  VLDL 36  LDLCALC 161*    Studies/Results: Dg Chest 2 View  06/23/2012  *RADIOLOGY REPORT*  Clinical Data: Slurred speech, stroke, history of smoking  CHEST - 2 VIEW  Comparison: None.  Findings: The lungs are clear but hyperaerated.  Mediastinal contours appear normal.  The heart is within upper limits normal. There are degenerative changes throughout the thoracic spine.  IMPRESSION: No active lung disease.  Hyperaeration.   Original Report Authenticated By: Juline Patch, M.D.    Ct Head Wo Contrast  06/23/2012  *RADIOLOGY REPORT*  Clinical Data: Slurred speech.  Facial droop.  Left leg tingling.  CT HEAD WITHOUT CONTRAST  Technique:  Contiguous axial images were obtained from the base of the skull through the vertex without contrast.  Comparison: None.  Findings: The brain does not show generalized atrophy.  The brainstem and cerebellum are unremarkable.  The cerebral hemispheres are normal except for focal low density in the left frontoparietal cortical and subcortical brain most consistent with an old infarction.  No  identifiably acute infarction, mass lesion, hemorrhage, hydrocephalus or extra-axial collection.  No calvarial abnormality.  Sinuses, middle ears and mastoids are clear.  IMPRESSION: No identifiably acute infarction.  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   Original Report Authenticated By: Thomasenia Sales, M.D.    Mr Maxine Glenn Head Wo Contrast  06/24/2012  *RADIOLOGY REPORT*  Clinical Data: Left MCA territory infarct.  Abnormal MRI yesterday.  MRA HEAD WITHOUT CONTRAST  Technique: Angiographic images of the Circle of Willis were obtained using MRA technique without intravenous contrast.  Comparison: MRI brain 06/23/2012.  Findings: The internal carotid arteries demonstrate mild irregularity within the cavernous segments bilaterally.  No significant stenosis is present.  There is signal loss in the distal A1 segments bilaterally, worse on the right.  The anterior communicating artery is patent.  There is moderate narrowing of the distal right M1 segment with significant signal loss in the proximal MCA branch vessels.  The more distal branch vessels are visualized with more mild segmental narrowing.  There is complete signal loss at the left MCA bifurcation with signal evident only in the most anterior left MCA branch vessel.  The vertebral arteries are codominant.  There is signal loss at the origin of the PICA vessels bilaterally.  The basilar artery is within normal limits.  The right posterior cerebral artery originates from basilar tip.  The left posterior  cerebral artery is of fetal type.  Moderate attenuation is present in the distal PCA branch vessels bilaterally.  IMPRESSION:  1.  Signal loss at the left MCA bifurcation compatible with high- grade stenosis or occlusion and compatible with the patient's posterior left MCA territory infarcts. 2.  Significant signal loss in the distal right M1 segment and proximal MCA branch vessels.  There is likely a significant  stenosis although may be exaggerated by artifact. 3.  Signal loss suggesting at least moderate stenosis in the distal right A1 segment. 4.  Moderate small vessel disease. 5.  Signal loss at the proximal PICA vessels bilaterally compatible with moderate stenosis.   Original Report Authenticated By: Jamesetta Orleans. MATTERN, M.D.    Mr Brain Wo Contrast  06/23/2012  *RADIOLOGY REPORT*  Clinical Data: Slurred speech.  Confusion.  Left lower extremity numbness.  Hypertension.  MRI HEAD WITHOUT CONTRAST  Technique:  Multiplanar, multiecho pulse sequences of the brain and surrounding structures were obtained according to standard protocol without intravenous contrast.  Comparison: 06/23/2012 head CT.  No comparison MR.  Findings: The patient is claustrophobic.  Fast technique imaging had to be utilized.  T1-weighted axial sequence was not obtained.  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  This involves portions of the left frontal lobe, left parietal lobe, left opercular and sub insular region and left temporal lobe.  Remote infarct at the left frontal - parietal junction.  Small vessel disease type changes.  No intracranial hemorrhage.  Major intracranial vascular structures are patent.  Limited evaluation of left middle cerebral artery branches on the present technique. The patient may eventually benefit from MR angiogram circle Willis.  Mild exophthalmos.  No intracranial mass lesion detected on this unenhanced exam.  No hydrocephalus.  Right frontal calvarial 1.2 cm lesion may represent a hemangioma. Stability can be confirmed on follow-up.  IMPRESSION:  The patient is claustrophobic.  Fast technique imaging had to be utilized.  T1-weighted axial sequence was not obtained.  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  This involves portions of the left frontal lobe, left parietal lobe, left opercular and sub insular region and left temporal lobe.  Remote  infarct at the left frontal - parietal junction.  Small vessel disease type changes.  No intracranial hemorrhage.  Major intracranial vascular structures are patent.  Limited evaluation of left middle cerebral artery branches on the present technique. The patient may eventually benefit from MR angiogram circle Willis.  Right frontal calvarial 1.2 cm lesion may represent a hemangioma. Stability can be confirmed on follow-up.  Critical Value/emergent results were called by telephone at the time of interpretation on 06/23/2012 at 5:00 p.m. to Dr. Bebe Shaggy, who verbally acknowledged these results.   Original Report Authenticated By: Fuller Canada, M.D.    US Carotid Duplex Bilateral  06/24/2012  *RADIOLOGY REPORT*  Clinical Data: Slurred speech, facial droop, left leg tingling  BILATERAL CAROTID DUPLEX ULTRASOUND  Technique: Gray scale imaging, color Doppler and duplex ultrasound was performed of bilateral carotid and vertebral arteries in the neck.  Comparison:  Head CT and brain MRI 06/23/2012  Criteria:  Quantification of carotid stenosis is based on velocity parameters that correlate the residual internal carotid diameter with NASCET-based stenosis levels, using the diameter of the distal internal carotid lumen as the denominator for stenosis measurement.  The following velocity measurements were obtained:                   PEAK SYSTOLIC/END  DIASTOLIC RIGHT ICA:                        57/22cm/sec CCA:                        68/12cm/sec SYSTOLIC ICA/CCA RATIO:     0.8 DIASTOLIC ICA/CCA RATIO:    1.8 ECA:                        91cm/sec  LEFT ICA:                        54/19cm/sec CCA:                        87/15cm/sec SYSTOLIC ICA/CCA RATIO:     0.7 DIASTOLIC ICA/CCA RATIO:    1.2 ECA:                        57cm/sec  Findings:  RIGHT CAROTID ARTERY: Echogenic shadowing plaque at the carotid bulb extending into the proximal ICA produces approximately 20% stenosis by gray scale criteria.  RIGHT VERTEBRAL  ARTERY:  Antegrade  LEFT CAROTID ARTERY: Echogenic shadowing plaque at the carotid bulb and proximal ICA produces less than 10% stenosis by gray scale criteria.  LEFT VERTEBRAL ARTERY:  Antegrade  Waveform morphologies are within normal limits bilaterally.  IMPRESSION: No sonographic evidence for hemodynamically significant stenosis in either carotid arterial system.   Original Report Authenticated By: Harrel Lemon, M.D.     Medications:  Scheduled Meds:   . aspirin  300 mg Rectal Daily   Or  . aspirin  325 mg Oral Daily  . clopidogrel  75 mg Oral Daily  . enoxaparin  40 mg Subcutaneous Q24H  . hydrochlorothiazide  25 mg Oral Daily  . metoprolol  100 mg Oral BID  . pneumococcal 23 valent vaccine  0.5 mL Intramuscular Tomorrow-1000  . potassium chloride  40 mEq Oral Once  . simvastatin  20 mg Oral q1800  . DISCONTD: LORazepam  1 mg Intravenous Once   Continuous Infusions:   . sodium chloride 75 mL/hr at 06/24/12 1205   PRN Meds:.acetaminophen, acetaminophen, albuterol, ondansetron (ZOFRAN) IV, senna-docusate   Assessment/Plan: Also see dictation.   LOS: 2 days   Sharyn Brilliant

## 2012-06-25 NOTE — Discharge Summary (Signed)
Physician Discharge Summary  Brandi [name] ZOX:[id number] DOB: 1955-11-25 DOA: 06/23/2012  PCP: No primary provider on file.  Admit date: 06/23/2012 Discharge date: 06/25/2012  Recommendations for Outpatient Follow-up:  1. Patient will follow up with Neurology in 2-3 weeks  Discharge Diagnoses:  Principal Problem:  *CVA (cerebral vascular accident) Active Problems:  HTN (hypertension)  Obesity  Expressive aphasia  Tobacco abuse   Discharge Condition: improved  Diet recommendation: low salt  Filed Weights   06/23/12 1136 06/23/12 2016 06/25/12 0500  Weight: 127.007 kg (280 lb) 147.97 kg (326 lb 3.4 oz) 125.4 kg (276 lb 7.3 oz)    History of present illness:  Brandi [name] is a 56 y.o. female with a history of hypertension, obesity, tobacco abuse who was brought to the emergency room by her family with difficulty speech. According to her family they had noticed approximately 4 days ago the patient was having difficulty forming her sentences. He also had noticed some slurring of her speech. Over the past 4 days her symptoms had started to improve. Today a family member/friend had commented that patient may be having a mild facial droop. This prompted the patient's ER visit. She denies any unilateral weakness or numbness. No changes in vision. No chest pain or shortness of breath. No fever or cough. Her main complaint is expressive aphasic. She is an active smoker. She does not take any antiplatelet agents. She does not have a regular primary care physician. In the ER she was evaluated MRI of the brain showed acute CVA. She's been referred for admission   Hospital Course:  This lady was admitted for further work up of stroke. Echo and carotid dopplers were found to be unremarkable.  MRI confirmed a left MCA distribution infarct.  MRA indicated a high grade stenosis at MCA bifurcation. LDL was found to elevated at 118, so she was started on statin.  She was continued on her outpatient  antihypertensive regimen. She was seen by neurology who recommended 3 months of dual antiplatelet therapy with aspirin and plavix, followed by single agent therapy.  The patient does not have any residual neurologic deficits and is requesting to discharge home.  She will follow up with neurology in the outpatient setting.   Procedures:  none  Consultations:  Neurology, Dr. Gerilyn Pilgrim  Discharge Exam: Filed Vitals:   06/25/12 0201 06/25/12 0500 06/25/12 0605 06/25/12 0952  BP: 137/96  149/70 150/102  Pulse: 49  52 56  Temp: 97.8 F (36.6 C)  98.2 F (36.8 C)   TempSrc: Oral  Oral   Resp: 18  20   Height:      Weight:  125.4 kg (276 lb 7.3 oz)    SpO2: 100%  99%     General: NAD Cardiovascular: s1, s2, rrr Respiratory: cta b  Discharge Instructions  Discharge Orders    Future Orders Please Complete By Expires   Diet - low sodium heart healthy      Diet - low sodium heart healthy      Increase activity slowly      Increase activity slowly          Medication List     As of 06/25/2012 10:47 AM    TAKE these medications         albuterol 108 (90 BASE) MCG/ACT inhaler   Commonly known as: PROVENTIL HFA;VENTOLIN HFA   Inhale 1-2 puffs into the lungs every 6 (six) hours as needed for wheezing.      aspirin  325 MG tablet   Take 1 tablet (325 mg total) by mouth daily.      clopidogrel 75 MG tablet   Commonly known as: PLAVIX   Take 1 tablet (75 mg total) by mouth daily.      hydrochlorothiazide 25 MG tablet   Commonly known as: HYDRODIURIL   Take 1 tablet (25 mg total) by mouth daily.      metoprolol 100 MG tablet   Commonly known as: LOPRESSOR   Take 1 tablet (100 mg total) by mouth 2 (two) times daily.      simvastatin 20 MG tablet   Commonly known as: ZOCOR   Take 1 tablet (20 mg total) by mouth daily at 6 PM.           Follow-up Information    Follow up with [facility], KOFI, MD. Schedule an appointment as soon as possible for a visit in 2 weeks.    Contact information:   [address] [place] [address] [phone]           The results of significant diagnostics from this hospitalization (including imaging, microbiology, ancillary and laboratory) are listed below for reference.    Significant Diagnostic Studies: Dg Chest 2 View  06/23/2012  *RADIOLOGY REPORT*  Clinical Data: Slurred speech, stroke, history of smoking  CHEST - 2 VIEW  Comparison: None.  Findings: The lungs are clear but hyperaerated.  Mediastinal contours appear normal.  The heart is within upper limits normal. There are degenerative changes throughout the thoracic spine.  IMPRESSION: No active lung disease.  Hyperaeration.   Original Report Authenticated By: Juline Patch, M.D.    Ct Head Wo Contrast  06/23/2012  *RADIOLOGY REPORT*  Clinical Data: Slurred speech.  Facial droop.  Left leg tingling.  CT HEAD WITHOUT CONTRAST  Technique:  Contiguous axial images were obtained from the base of the skull through the vertex without contrast.  Comparison: None.  Findings: The brain does not show generalized atrophy.  The brainstem and cerebellum are unremarkable.  The cerebral hemispheres are normal except for focal low density in the left frontoparietal cortical and subcortical brain most consistent with an old infarction.  No identifiably acute infarction, mass lesion, hemorrhage, hydrocephalus or extra-axial collection.  No calvarial abnormality.  Sinuses, middle ears and mastoids are clear.  IMPRESSION: No identifiably acute infarction.  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   Original Report Authenticated By: Thomasenia Sales, M.D.    Mr Maxine Glenn Head Wo Contrast  06/24/2012  *RADIOLOGY REPORT*  Clinical Data: Left MCA territory infarct.  Abnormal MRI yesterday.  MRA HEAD WITHOUT CONTRAST  Technique: Angiographic images of the Circle of Willis were obtained using MRA technique without intravenous contrast.   Comparison: MRI brain 06/23/2012.  Findings: The internal carotid arteries demonstrate mild irregularity within the cavernous segments bilaterally.  No significant stenosis is present.  There is signal loss in the distal A1 segments bilaterally, worse on the right.  The anterior communicating artery is patent.  There is moderate narrowing of the distal right M1 segment with significant signal loss in the proximal MCA branch vessels.  The more distal branch vessels are visualized with more mild segmental narrowing.  There is complete signal loss at the left MCA bifurcation with signal evident only in the most anterior left MCA branch vessel.  The vertebral arteries are codominant.  There is signal loss at the origin of the PICA vessels bilaterally.  The basilar artery  is within normal limits.  The right posterior cerebral artery originates from basilar tip.  The left posterior cerebral artery is of fetal type.  Moderate attenuation is present in the distal PCA branch vessels bilaterally.  IMPRESSION:  1.  Signal loss at the left MCA bifurcation compatible with high- grade stenosis or occlusion and compatible with the patient's posterior left MCA territory infarcts. 2.  Significant signal loss in the distal right M1 segment and proximal MCA branch vessels.  There is likely a significant stenosis although may be exaggerated by artifact. 3.  Signal loss suggesting at least moderate stenosis in the distal right A1 segment. 4.  Moderate small vessel disease. 5.  Signal loss at the proximal PICA vessels bilaterally compatible with moderate stenosis.   Original Report Authenticated By: Jamesetta Orleans. MATTERN, M.D.    Mr Brain Wo Contrast  06/23/2012  *RADIOLOGY REPORT*  Clinical Data: Slurred speech.  Confusion.  Left lower extremity numbness.  Hypertension.  MRI HEAD WITHOUT CONTRAST  Technique:  Multiplanar, multiecho pulse sequences of the brain and surrounding structures were obtained according to standard protocol  without intravenous contrast.  Comparison: 06/23/2012 head CT.  No comparison MR.  Findings: The patient is claustrophobic.  Fast technique imaging had to be utilized.  T1-weighted axial sequence was not obtained.  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  This involves portions of the left frontal lobe, left parietal lobe, left opercular and sub insular region and left temporal lobe.  Remote infarct at the left frontal - parietal junction.  Small vessel disease type changes.  No intracranial hemorrhage.  Major intracranial vascular structures are patent.  Limited evaluation of left middle cerebral artery branches on the present technique. The patient may eventually benefit from MR angiogram circle Willis.  Mild exophthalmos.  No intracranial mass lesion detected on this unenhanced exam.  No hydrocephalus.  Right frontal calvarial 1.2 cm lesion may represent a hemangioma. Stability can be confirmed on follow-up.  IMPRESSION:  The patient is claustrophobic.  Fast technique imaging had to be utilized.  T1-weighted axial sequence was not obtained.  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  This involves portions of the left frontal lobe, left parietal lobe, left opercular and sub insular region and left temporal lobe.  Remote infarct at the left frontal - parietal junction.  Small vessel disease type changes.  No intracranial hemorrhage.  Major intracranial vascular structures are patent.  Limited evaluation of left middle cerebral artery branches on the present technique. The patient may eventually benefit from MR angiogram circle Willis.  Right frontal calvarial 1.2 cm lesion may represent a hemangioma. Stability can be confirmed on follow-up.  Critical Value/emergent results were called by telephone at the time of interpretation on 06/23/2012 at 5:00 p.m. to Dr. Bebe Shaggy, who verbally acknowledged these results.   Original Report Authenticated By: Fuller Canada, M.D.    US Carotid Duplex Bilateral  06/24/2012  *RADIOLOGY REPORT*  Clinical Data: Slurred speech, facial droop, left leg tingling  BILATERAL CAROTID DUPLEX ULTRASOUND  Technique: Gray scale imaging, color Doppler and duplex ultrasound was performed of bilateral carotid and vertebral arteries in the neck.  Comparison:  Head CT and brain MRI 06/23/2012  Criteria:  Quantification of carotid stenosis is based on velocity parameters that correlate the residual internal carotid diameter with NASCET-based stenosis levels, using the diameter of the distal internal carotid lumen as the denominator for stenosis measurement.  The following velocity measurements were obtained:  PEAK SYSTOLIC/END DIASTOLIC RIGHT ICA:                        57/22cm/sec CCA:                        68/12cm/sec SYSTOLIC ICA/CCA RATIO:     0.8 DIASTOLIC ICA/CCA RATIO:    1.8 ECA:                        91cm/sec  LEFT ICA:                        54/19cm/sec CCA:                        87/15cm/sec SYSTOLIC ICA/CCA RATIO:     0.7 DIASTOLIC ICA/CCA RATIO:    1.2 ECA:                        57cm/sec  Findings:  RIGHT CAROTID ARTERY: Echogenic shadowing plaque at the carotid bulb extending into the proximal ICA produces approximately 20% stenosis by gray scale criteria.  RIGHT VERTEBRAL ARTERY:  Antegrade  LEFT CAROTID ARTERY: Echogenic shadowing plaque at the carotid bulb and proximal ICA produces less than 10% stenosis by gray scale criteria.  LEFT VERTEBRAL ARTERY:  Antegrade  Waveform morphologies are within normal limits bilaterally.  IMPRESSION: No sonographic evidence for hemodynamically significant stenosis in either carotid arterial system.   Original Report Authenticated By: Harrel Lemon, M.D.     Microbiology: No results found for this or any previous visit (from the past 240 hour(s)).   Labs: Basic Metabolic Panel:  Lab 06/24/12 0981 06/23/12 1325  NA 136 136  K 3.1* 3.4*  CL 97 95*  CO2 26 28    GLUCOSE 111* 148*  BUN 15 15  CREATININE 1.00 1.02  CALCIUM 9.5 9.9  MG -- --  PHOS -- --   Liver Function Tests: No results found for this basename: AST:5,ALT:5,ALKPHOS:5,BILITOT:5,PROT:5,ALBUMIN:5 in the last 168 hours No results found for this basename: LIPASE:5,AMYLASE:5 in the last 168 hours No results found for this basename: AMMONIA:5 in the last 168 hours CBC:  Lab 06/24/12 0446 06/23/12 1325  WBC 6.3 6.2  NEUTROABS -- 3.5  HGB 16.9* 17.3*  HCT 49.5* 50.9*  MCV 84.3 84.7  PLT 284 301   Cardiac Enzymes: No results found for this basename: CKTOTAL:5,CKMB:5,CKMBINDEX:5,TROPONINI:5 in the last 168 hours BNP: BNP (last 3 results) No results found for this basename: PROBNP:3 in the last 8760 hours CBG: No results found for this basename: GLUCAP:5 in the last 168 hours  Time coordinating discharge: greater than 30 minutes  Signed:  MEMON,JEHANZEB  [facility] 06/25/2012, 10:47 AM

## 2012-06-25 NOTE — Progress Notes (Signed)
Alert and oriented. Vital signs stable. Saline lock removed. Telemetry D/C'ed. Discharge instructions given. Prescriptions given. Patient verbalized understanding of instructions. Left floor via wheelchair with nursing staff and family member.  Discharged home.  Myrth Dahan M 06/25/2012 2:55 PM 

## 2012-08-27 NOTE — Progress Notes (Signed)
UR chart review completed.  

## 2013-03-18 DIAGNOSIS — G8929 Other chronic pain: Secondary | ICD-10-CM | POA: Diagnosis not present

## 2013-03-18 DIAGNOSIS — M1990 Unspecified osteoarthritis, unspecified site: Secondary | ICD-10-CM | POA: Diagnosis not present

## 2013-03-18 DIAGNOSIS — R498 Other voice and resonance disorders: Secondary | ICD-10-CM | POA: Diagnosis not present

## 2013-03-18 DIAGNOSIS — I10 Essential (primary) hypertension: Secondary | ICD-10-CM | POA: Diagnosis not present

## 2013-05-15 DIAGNOSIS — I10 Essential (primary) hypertension: Secondary | ICD-10-CM | POA: Diagnosis not present

## 2013-05-15 DIAGNOSIS — G8929 Other chronic pain: Secondary | ICD-10-CM | POA: Diagnosis not present

## 2013-05-15 DIAGNOSIS — R498 Other voice and resonance disorders: Secondary | ICD-10-CM | POA: Diagnosis not present

## 2013-05-15 DIAGNOSIS — M1990 Unspecified osteoarthritis, unspecified site: Secondary | ICD-10-CM | POA: Diagnosis not present

## 2013-05-25 DIAGNOSIS — E782 Mixed hyperlipidemia: Secondary | ICD-10-CM | POA: Diagnosis not present

## 2013-05-25 DIAGNOSIS — M818 Other osteoporosis without current pathological fracture: Secondary | ICD-10-CM | POA: Diagnosis not present

## 2013-05-25 DIAGNOSIS — I10 Essential (primary) hypertension: Secondary | ICD-10-CM | POA: Diagnosis not present

## 2013-05-25 DIAGNOSIS — G609 Hereditary and idiopathic neuropathy, unspecified: Secondary | ICD-10-CM | POA: Diagnosis not present

## 2013-07-20 DIAGNOSIS — G8929 Other chronic pain: Secondary | ICD-10-CM | POA: Diagnosis not present

## 2013-07-20 DIAGNOSIS — M1990 Unspecified osteoarthritis, unspecified site: Secondary | ICD-10-CM | POA: Diagnosis not present

## 2013-07-20 DIAGNOSIS — I10 Essential (primary) hypertension: Secondary | ICD-10-CM | POA: Diagnosis not present

## 2013-07-20 DIAGNOSIS — Z79899 Other long term (current) drug therapy: Secondary | ICD-10-CM | POA: Diagnosis not present

## 2013-09-17 DIAGNOSIS — I10 Essential (primary) hypertension: Secondary | ICD-10-CM | POA: Diagnosis not present

## 2013-09-17 DIAGNOSIS — G8929 Other chronic pain: Secondary | ICD-10-CM | POA: Diagnosis not present

## 2013-09-17 DIAGNOSIS — Z79899 Other long term (current) drug therapy: Secondary | ICD-10-CM | POA: Diagnosis not present

## 2013-09-17 DIAGNOSIS — M1990 Unspecified osteoarthritis, unspecified site: Secondary | ICD-10-CM | POA: Diagnosis not present

## 2013-11-17 DIAGNOSIS — Z79899 Other long term (current) drug therapy: Secondary | ICD-10-CM | POA: Diagnosis not present

## 2013-11-17 DIAGNOSIS — I10 Essential (primary) hypertension: Secondary | ICD-10-CM | POA: Diagnosis not present

## 2013-11-17 DIAGNOSIS — M25569 Pain in unspecified knee: Secondary | ICD-10-CM | POA: Diagnosis not present

## 2013-11-17 DIAGNOSIS — G8929 Other chronic pain: Secondary | ICD-10-CM | POA: Diagnosis not present

## 2013-11-27 DIAGNOSIS — M175 Other unilateral secondary osteoarthritis of knee: Secondary | ICD-10-CM | POA: Diagnosis not present

## 2013-11-27 DIAGNOSIS — M25569 Pain in unspecified knee: Secondary | ICD-10-CM | POA: Diagnosis not present

## 2014-01-07 DIAGNOSIS — J309 Allergic rhinitis, unspecified: Secondary | ICD-10-CM | POA: Diagnosis not present

## 2014-01-07 DIAGNOSIS — G8929 Other chronic pain: Secondary | ICD-10-CM | POA: Diagnosis not present

## 2014-01-07 DIAGNOSIS — I10 Essential (primary) hypertension: Secondary | ICD-10-CM | POA: Diagnosis not present

## 2014-01-07 DIAGNOSIS — M25569 Pain in unspecified knee: Secondary | ICD-10-CM | POA: Diagnosis not present

## 2014-03-09 DIAGNOSIS — G8929 Other chronic pain: Secondary | ICD-10-CM | POA: Diagnosis not present

## 2014-03-09 DIAGNOSIS — Z79899 Other long term (current) drug therapy: Secondary | ICD-10-CM | POA: Diagnosis not present

## 2014-03-09 DIAGNOSIS — I10 Essential (primary) hypertension: Secondary | ICD-10-CM | POA: Diagnosis not present

## 2014-03-09 DIAGNOSIS — M25569 Pain in unspecified knee: Secondary | ICD-10-CM | POA: Diagnosis not present

## 2014-06-08 DIAGNOSIS — I10 Essential (primary) hypertension: Secondary | ICD-10-CM | POA: Diagnosis not present

## 2014-06-08 DIAGNOSIS — Z79899 Other long term (current) drug therapy: Secondary | ICD-10-CM | POA: Diagnosis not present

## 2014-06-08 DIAGNOSIS — M25569 Pain in unspecified knee: Secondary | ICD-10-CM | POA: Diagnosis not present

## 2014-06-08 DIAGNOSIS — G8929 Other chronic pain: Secondary | ICD-10-CM | POA: Diagnosis not present

## 2014-10-13 ENCOUNTER — Encounter (HOSPITAL_COMMUNITY): Payer: Self-pay

## 2014-10-13 ENCOUNTER — Emergency Department (HOSPITAL_COMMUNITY)
Admission: EM | Admit: 2014-10-13 | Discharge: 2014-10-13 | Disposition: A | Discharge status: Home / Self Care | Payer: Medicare PPO | Attending: Emergency Medicine | Admitting: Emergency Medicine

## 2014-10-13 DIAGNOSIS — R531 Weakness: Secondary | ICD-10-CM | POA: Insufficient documentation

## 2014-10-13 DIAGNOSIS — Z79899 Other long term (current) drug therapy: Secondary | ICD-10-CM | POA: Insufficient documentation

## 2014-10-13 DIAGNOSIS — Z720 Tobacco use: Secondary | ICD-10-CM | POA: Diagnosis not present

## 2014-10-13 DIAGNOSIS — R1110 Vomiting, unspecified: Secondary | ICD-10-CM | POA: Diagnosis present

## 2014-10-13 DIAGNOSIS — E669 Obesity, unspecified: Secondary | ICD-10-CM | POA: Insufficient documentation

## 2014-10-13 DIAGNOSIS — Z862 Personal history of diseases of the blood and blood-forming organs and certain disorders involving the immune mechanism: Secondary | ICD-10-CM | POA: Insufficient documentation

## 2014-10-13 DIAGNOSIS — I10 Essential (primary) hypertension: Secondary | ICD-10-CM | POA: Diagnosis not present

## 2014-10-13 DIAGNOSIS — K529 Noninfective gastroenteritis and colitis, unspecified: Secondary | ICD-10-CM | POA: Diagnosis not present

## 2014-10-13 LAB — COMPREHENSIVE METABOLIC PANEL
ALBUMIN: 4.4 g/dL (ref 3.5–5.2)
ALT: 25 U/L (ref 0–35)
AST: 50 U/L — AB (ref 0–37)
Alkaline Phosphatase: 80 U/L (ref 39–117)
Anion gap: 9 (ref 5–15)
BILIRUBIN TOTAL: 0.8 mg/dL (ref 0.3–1.2)
BUN: 14 mg/dL (ref 6–23)
CALCIUM: 9.5 mg/dL (ref 8.4–10.5)
CHLORIDE: 98 meq/L (ref 96–112)
CO2: 29 mmol/L (ref 19–32)
CREATININE: 1.24 mg/dL — AB (ref 0.50–1.10)
GFR calc Af Amer: 54 mL/min — ABNORMAL LOW (ref >90)
GFR, EST NON AFRICAN AMERICAN: 47 mL/min — AB (ref >90)
Glucose, Bld: 134 mg/dL — ABNORMAL HIGH (ref 70–99)
Potassium: 3.7 mmol/L (ref 3.5–5.1)
SODIUM: 136 mmol/L (ref 135–145)
Total Protein: 8.7 g/dL — ABNORMAL HIGH (ref 6.0–8.3)

## 2014-10-13 LAB — CBC WITH DIFFERENTIAL/PLATELET
Basophils Absolute: 0 10*3/uL (ref 0.0–0.1)
Basophils Relative: 0 % (ref 0–1)
EOS ABS: 0 10*3/uL (ref 0.0–0.7)
EOS PCT: 0 % (ref 0–5)
HEMATOCRIT: 45.8 % (ref 36.0–46.0)
Hemoglobin: 15.3 g/dL — ABNORMAL HIGH (ref 12.0–15.0)
LYMPHS ABS: 2.9 10*3/uL (ref 0.7–4.0)
LYMPHS PCT: 28 % (ref 12–46)
MCH: 28.3 pg (ref 26.0–34.0)
MCHC: 33.4 g/dL (ref 30.0–36.0)
MCV: 84.8 fL (ref 78.0–100.0)
MONO ABS: 0.7 10*3/uL (ref 0.1–1.0)
Monocytes Relative: 7 % (ref 3–12)
Neutro Abs: 6.7 10*3/uL (ref 1.7–7.7)
Neutrophils Relative %: 65 % (ref 43–77)
Platelets: 270 10*3/uL (ref 150–400)
RBC: 5.4 MIL/uL — AB (ref 3.87–5.11)
RDW: 13.6 % (ref 11.5–15.5)
WBC: 10.3 10*3/uL (ref 4.0–10.5)

## 2014-10-13 LAB — LIPASE, BLOOD: Lipase: 26 U/L (ref 11–59)

## 2014-10-13 MED ORDER — PROMETHAZINE HCL 25 MG PO TABS: 25.0000 mg | ORAL_TABLET | Freq: Four times a day (QID) | ORAL | Status: DC | PRN

## 2014-10-13 MED ORDER — MORPHINE SULFATE 4 MG/ML IJ SOLN
4.0000 mg | Freq: Once | INTRAMUSCULAR | Status: AC
  Administered 2014-10-13: 4 mg via INTRAVENOUS
  Filled 2014-10-13: qty 1

## 2014-10-13 MED ORDER — SODIUM CHLORIDE 0.9 % IV BOLUS (SEPSIS)
1000.0000 mL | Freq: Once | INTRAVENOUS | Status: AC
  Administered 2014-10-13: 1000 mL via INTRAVENOUS

## 2014-10-13 MED ORDER — FAMOTIDINE 20 MG PO TABS: 20.0000 mg | ORAL_TABLET | Freq: Two times a day (BID) | ORAL | Status: DC

## 2014-10-13 MED ORDER — ONDANSETRON HCL 4 MG/2ML IJ SOLN
4.0000 mg | Freq: Once | INTRAMUSCULAR | Status: AC
  Administered 2014-10-13: 4 mg via INTRAVENOUS
  Filled 2014-10-13: qty 2

## 2014-10-13 NOTE — Discharge Instructions (Signed)
Medication for nausea and stomach irritation. Try to drink clear liquids tonight. Avoid rich greasy foods for 24 hours.

## 2014-10-13 NOTE — ED Notes (Signed)
Nausea, vomiting, diarrhea, and abdominal pain.

## 2014-10-13 NOTE — ED Provider Notes (Signed)
CSN: [id number]     Arrival date & time 10/13/14  1930 History  This chart was scribed for Nat Christen, MD by [facility], ED Scribe. The patient was seen in APA07/APA07 and the patient's care was started at 8:00 PM.  Chief Complaint  Patient presents with  . Emesis   Patient is a 58 y.o. female presenting with vomiting. The history is provided by the patient. No language interpreter was used.  Emesis Associated symptoms: abdominal pain and diarrhea    HPI Comments: Brandi [name] is a 58 y.o. female who presents to the Emergency Department complaining of epigastric abdominal pain, onset 2 days ago. Pt has vomiting, diarrhea and generalized weakness as associated symptoms. Pt hast thrown up 5x and diarrhea 6x today. Pt has had sick contacts.  Son had similar symptoms. Her PCP is Dr. Merlene Laughter.  Severity is moderate. Nothing makes symptoms better or worse.  Past Medical History  Diagnosis Date  . Anemia   . Hypertension    Past Surgical History  Procedure Laterality Date  . Knee surgery  right knee  . Cyst removed from right breast     No family history on file. History  Substance Use Topics  . Smoking status: Current Every Day Smoker -- 0.25 packs/day    Types: Cigarettes  . Smokeless tobacco: Not on file  . Alcohol Use: 0.6 oz/week    1 Glasses of wine per week   OB History    No data available     Review of Systems  Gastrointestinal: Positive for nausea, vomiting, abdominal pain and diarrhea.  Neurological: Positive for weakness.  All other systems reviewed and are negative.  Allergies  Codeine  Home Medications   Prior to Admission medications   Medication Sig Start Date End Date Taking? Authorizing Provider  ALPRAZolam Duanne Moron) 0.5 MG tablet Take 0.5 mg by mouth daily as needed for anxiety.   Yes Historical Provider, MD  lisinopril-hydrochlorothiazide (PRINZIDE,ZESTORETIC) 20-12.5 MG per tablet Take 1 tablet by mouth daily.   Yes Historical Provider, MD   metoprolol (LOPRESSOR) 100 MG tablet Take 100 mg by mouth 2 (two) times daily.   Yes Historical Provider, MD  simvastatin (ZOCOR) 20 MG tablet Take 20 mg by mouth daily.   Yes Historical Provider, MD  traMADol (ULTRAM) 50 MG tablet Take 50-100 mg by mouth every 6 (six) hours as needed (pain).   Yes Historical Provider, MD  albuterol (PROVENTIL HFA;VENTOLIN HFA) 108 (90 BASE) MCG/ACT inhaler Inhale 1-2 puffs into the lungs every 6 (six) hours as needed for wheezing. Patient not taking: Reported on 10/13/2014 02/04/12 02/03/13  Prentiss Bells, MD  famotidine (PEPCID) 20 MG tablet Take 1 tablet (20 mg total) by mouth 2 (two) times daily. 10/13/14   Nat Christen, MD  hydrochlorothiazide (HYDRODIURIL) 25 MG tablet Take 1 tablet (25 mg total) by mouth daily. Patient not taking: Reported on 10/13/2014 02/04/12 02/03/13  Prentiss Bells, MD  metoprolol (LOPRESSOR) 100 MG tablet Take 1 tablet (100 mg total) by mouth 2 (two) times daily. Patient not taking: Reported on 10/13/2014 06/24/12 06/24/13  Kathie Dike, MD  promethazine (PHENERGAN) 25 MG tablet Take 1 tablet (25 mg total) by mouth every 6 (six) hours as needed. 10/13/14   Nat Christen, MD  simvastatin (ZOCOR) 20 MG tablet Take 1 tablet (20 mg total) by mouth daily at 6 PM. Patient not taking: Reported on 10/13/2014 06/24/12 06/24/13  Kathie Dike, MD   BP 130/66 mmHg  Pulse 55  Temp(Src) 97.5 F (36.4 C) (Oral)  Resp 20  Ht 5\' 6"  (1.676 m)  Wt 280 lb (127.007 kg)  BMI 45.21 kg/m2  SpO2 100% Physical Exam  Constitutional: She is oriented to person, place, and time. She appears well-developed and well-nourished.  Obese.  HENT:  Head: Normocephalic and atraumatic.  Eyes: Conjunctivae and EOM are normal. Pupils are equal, round, and reactive to light.  Neck: Normal range of motion. Neck supple.  Cardiovascular: Normal rate and regular rhythm.   Pulmonary/Chest: Effort normal and breath sounds normal.  Abdominal: Soft. Bowel sounds are normal.   Minimum epigastrium tenderness.  Musculoskeletal: Normal range of motion.  Neurological: She is alert and oriented to person, place, and time.  Skin: Skin is warm and dry.  Psychiatric: She has a normal mood and affect. Her behavior is normal.  Nursing note and vitals reviewed.   ED Course  Procedures  DIAGNOSTIC STUDIES: Oxygen Saturation is 98% on room air, normal by my interpretation.    COORDINATION OF CARE:  8:08 PM Will give pt IV, antiemetics and check labs. Pt agreed to plan.  Labs Review Labs Reviewed  COMPREHENSIVE METABOLIC PANEL - Abnormal; Notable for the following:    Glucose, Bld 134 (*)    Creatinine, Ser 1.24 (*)    Total Protein 8.7 (*)    AST 50 (*)    GFR calc non Af Amer 47 (*)    GFR calc Af Amer 54 (*)    All other components within normal limits  CBC WITH DIFFERENTIAL - Abnormal; Notable for the following:    RBC 5.40 (*)    Hemoglobin 15.3 (*)    All other components within normal limits  LIPASE, BLOOD    Imaging Review No results found.   EKG Interpretation None      MDM   Final diagnoses:  Gastroenteritis   Patient feels much better after IV fluids, antibiotics, pain management. White count normal. Lipase normal. Discharge medications Pepcid 20 mg and Phenergan 25 mg   I personally performed the services described in this documentation, which was scribed in my presence. The recorded information has been reviewed and is accurate.       Nat Christen, MD 10/13/14 [phone]

## 2014-10-22 ENCOUNTER — Ambulatory Visit (HOSPITAL_COMMUNITY)
Admission: RE | Admit: 2014-10-22 | Discharge: 2014-10-22 | Disposition: A | Discharge status: Home / Self Care | Payer: Medicare PPO | Source: Ambulatory Visit | Attending: Family Medicine | Admitting: Family Medicine

## 2014-10-22 ENCOUNTER — Other Ambulatory Visit (HOSPITAL_COMMUNITY): Payer: Self-pay | Admitting: Family Medicine

## 2014-10-22 DIAGNOSIS — M1711 Unilateral primary osteoarthritis, right knee: Secondary | ICD-10-CM | POA: Insufficient documentation

## 2014-10-22 DIAGNOSIS — M25561 Pain in right knee: Secondary | ICD-10-CM | POA: Diagnosis present

## 2016-03-29 ENCOUNTER — Ambulatory Visit: Payer: Medicare PPO | Admitting: Orthopaedic Surgery

## 2016-07-13 ENCOUNTER — Other Ambulatory Visit (HOSPITAL_COMMUNITY): Payer: Self-pay | Admitting: Family Medicine

## 2016-07-13 ENCOUNTER — Ambulatory Visit (HOSPITAL_COMMUNITY)
Admission: RE | Admit: 2016-07-13 | Discharge: 2016-07-13 | Disposition: A | Discharge status: Home / Self Care | Payer: Medicare PPO | Source: Ambulatory Visit | Attending: Family Medicine | Admitting: Family Medicine

## 2016-07-13 DIAGNOSIS — M150 Primary generalized (osteo)arthritis: Principal | ICD-10-CM

## 2016-07-13 DIAGNOSIS — M1711 Unilateral primary osteoarthritis, right knee: Secondary | ICD-10-CM | POA: Diagnosis not present

## 2016-07-13 DIAGNOSIS — M159 Polyosteoarthritis, unspecified: Secondary | ICD-10-CM

## 2016-07-13 DIAGNOSIS — M5135 Other intervertebral disc degeneration, thoracolumbar region: Secondary | ICD-10-CM | POA: Insufficient documentation

## 2016-07-13 DIAGNOSIS — M5136 Other intervertebral disc degeneration, lumbar region: Secondary | ICD-10-CM | POA: Insufficient documentation

## 2016-08-17 ENCOUNTER — Encounter: Payer: Self-pay | Admitting: Orthopaedic Surgery

## 2016-09-18 ENCOUNTER — Ambulatory Visit: Payer: Medicare PPO | Admitting: Orthopaedic Surgery

## 2016-09-20 ENCOUNTER — Encounter: Payer: Self-pay | Admitting: Orthopaedic Surgery

## 2017-01-25 ENCOUNTER — Inpatient Hospital Stay (HOSPITAL_COMMUNITY)
Admission: EM | Admit: 2017-01-25 | Discharge: 2017-02-04 | DRG: 330 | Disposition: A | Discharge status: Home / Self Care | Payer: Medicare HMO | Attending: Family Medicine | Admitting: Family Medicine

## 2017-01-25 ENCOUNTER — Encounter (HOSPITAL_COMMUNITY): Payer: Self-pay | Admitting: Emergency Medicine

## 2017-01-25 ENCOUNTER — Emergency Department (HOSPITAL_COMMUNITY): Payer: Medicare HMO

## 2017-01-25 DIAGNOSIS — E1100 Type 2 diabetes mellitus with hyperosmolarity without nonketotic hyperglycemic-hyperosmolar coma (NKHHC): Secondary | ICD-10-CM

## 2017-01-25 DIAGNOSIS — K6389 Other specified diseases of intestine: Secondary | ICD-10-CM

## 2017-01-25 DIAGNOSIS — D649 Anemia, unspecified: Secondary | ICD-10-CM | POA: Diagnosis not present

## 2017-01-25 DIAGNOSIS — Z8673 Personal history of transient ischemic attack (TIA), and cerebral infarction without residual deficits: Secondary | ICD-10-CM

## 2017-01-25 DIAGNOSIS — D500 Iron deficiency anemia secondary to blood loss (chronic): Secondary | ICD-10-CM | POA: Diagnosis present

## 2017-01-25 DIAGNOSIS — Z720 Tobacco use: Secondary | ICD-10-CM

## 2017-01-25 DIAGNOSIS — Z23 Encounter for immunization: Secondary | ICD-10-CM

## 2017-01-25 DIAGNOSIS — E669 Obesity, unspecified: Secondary | ICD-10-CM | POA: Diagnosis present

## 2017-01-25 DIAGNOSIS — R0602 Shortness of breath: Secondary | ICD-10-CM | POA: Diagnosis not present

## 2017-01-25 DIAGNOSIS — Z7982 Long term (current) use of aspirin: Secondary | ICD-10-CM

## 2017-01-25 DIAGNOSIS — Z7984 Long term (current) use of oral hypoglycemic drugs: Secondary | ICD-10-CM

## 2017-01-25 DIAGNOSIS — R4701 Aphasia: Secondary | ICD-10-CM

## 2017-01-25 DIAGNOSIS — D1330 Benign neoplasm of unspecified part of small intestine: Secondary | ICD-10-CM | POA: Diagnosis not present

## 2017-01-25 DIAGNOSIS — K640 First degree hemorrhoids: Secondary | ICD-10-CM | POA: Diagnosis present

## 2017-01-25 DIAGNOSIS — Z6836 Body mass index (BMI) 36.0-36.9, adult: Secondary | ICD-10-CM

## 2017-01-25 DIAGNOSIS — Z87891 Personal history of nicotine dependence: Secondary | ICD-10-CM

## 2017-01-25 DIAGNOSIS — I10 Essential (primary) hypertension: Secondary | ICD-10-CM

## 2017-01-25 DIAGNOSIS — D509 Iron deficiency anemia, unspecified: Secondary | ICD-10-CM

## 2017-01-25 DIAGNOSIS — E119 Type 2 diabetes mellitus without complications: Secondary | ICD-10-CM | POA: Diagnosis not present

## 2017-01-25 DIAGNOSIS — Z79899 Other long term (current) drug therapy: Secondary | ICD-10-CM

## 2017-01-25 DIAGNOSIS — I63011 Cerebral infarction due to thrombosis of right vertebral artery: Secondary | ICD-10-CM

## 2017-01-25 DIAGNOSIS — Z6841 Body Mass Index (BMI) 40.0 and over, adult: Secondary | ICD-10-CM

## 2017-01-25 DIAGNOSIS — K921 Melena: Secondary | ICD-10-CM | POA: Diagnosis present

## 2017-01-25 HISTORY — DX: Type 2 diabetes mellitus without complications: E11.9

## 2017-01-25 HISTORY — DX: Cerebral infarction, unspecified: I63.9

## 2017-01-25 LAB — CBC
HCT: 14.4 % — ABNORMAL LOW (ref 36.0–46.0)
HCT: 14.1 % — ABNORMAL LOW (ref 36.0–46.0)
HEMATOCRIT: 21.9 % — AB (ref 36.0–46.0)
Hemoglobin: 3.6 g/dL — CL (ref 12.0–15.0)
Hemoglobin: 3.5 g/dL — CL (ref 12.0–15.0)
Hemoglobin: 5.9 g/dL — CL (ref 12.0–15.0)
MCH: 14.8 pg — ABNORMAL LOW (ref 26.0–34.0)
MCH: 14.6 pg — AB (ref 26.0–34.0)
MCH: 17.9 pg — ABNORMAL LOW (ref 26.0–34.0)
MCHC: 25 g/dL — ABNORMAL LOW (ref 30.0–36.0)
MCHC: 24.8 g/dL — ABNORMAL LOW (ref 30.0–36.0)
MCHC: 26.9 g/dL — ABNORMAL LOW (ref 30.0–36.0)
MCV: 59.3 fL — AB (ref 78.0–100.0)
MCV: 59 fL — AB (ref 78.0–100.0)
MCV: 66.6 fL — ABNORMAL LOW (ref 78.0–100.0)
PLATELETS: 357 10*3/uL (ref 150–400)
PLATELETS: 351 10*3/uL (ref 150–400)
Platelets: 368 10*3/uL (ref 150–400)
RBC: 2.43 MIL/uL — ABNORMAL LOW (ref 3.87–5.11)
RBC: 2.39 MIL/uL — ABNORMAL LOW (ref 3.87–5.11)
RBC: 3.29 MIL/uL — ABNORMAL LOW (ref 3.87–5.11)
RDW: 22.8 % — AB (ref 11.5–15.5)
RDW: 22.7 % — AB (ref 11.5–15.5)
RDW: 27.4 % — ABNORMAL HIGH (ref 11.5–15.5)
WBC: 6.4 10*3/uL (ref 4.0–10.5)
WBC: 6.2 10*3/uL (ref 4.0–10.5)
WBC: 5.9 10*3/uL (ref 4.0–10.5)

## 2017-01-25 LAB — IRON AND TIBC
Iron: 8 ug/dL — ABNORMAL LOW (ref 28–170)
SATURATION RATIOS: 2 % — AB (ref 10.4–31.8)
TIBC: 454 ug/dL — ABNORMAL HIGH (ref 250–450)
UIBC: 446 ug/dL

## 2017-01-25 LAB — MRSA PCR SCREENING: MRSA BY PCR: NEGATIVE

## 2017-01-25 LAB — COMPREHENSIVE METABOLIC PANEL
ALK PHOS: 61 U/L (ref 38–126)
ALT: 16 U/L (ref 14–54)
AST: 27 U/L (ref 15–41)
Albumin: 3.2 g/dL — ABNORMAL LOW (ref 3.5–5.0)
Anion gap: 11 (ref 5–15)
BUN: 17 mg/dL (ref 6–20)
CALCIUM: 8.5 mg/dL — AB (ref 8.9–10.3)
CO2: 20 mmol/L — AB (ref 22–32)
CREATININE: 1.29 mg/dL — AB (ref 0.44–1.00)
Chloride: 107 mmol/L (ref 101–111)
GFR calc non Af Amer: 44 mL/min — ABNORMAL LOW (ref >60)
GFR, EST AFRICAN AMERICAN: 51 mL/min — AB (ref >60)
Glucose, Bld: 126 mg/dL — ABNORMAL HIGH (ref 65–99)
Potassium: 3.6 mmol/L (ref 3.5–5.1)
Sodium: 138 mmol/L (ref 135–145)
Total Bilirubin: <0.1 mg/dL — ABNORMAL LOW (ref 0.3–1.2)
Total Protein: 6.7 g/dL (ref 6.5–8.1)

## 2017-01-25 LAB — POC OCCULT BLOOD, ED: Fecal Occult Bld: POSITIVE — AB

## 2017-01-25 LAB — PREPARE RBC (CROSSMATCH)

## 2017-01-25 LAB — RETICULOCYTES
RBC.: 2.41 MIL/uL — AB (ref 3.87–5.11)
Retic Count, Absolute: 38.6 10*3/uL (ref 19.0–186.0)
Retic Ct Pct: 1.6 % (ref 0.4–3.1)

## 2017-01-25 LAB — BRAIN NATRIURETIC PEPTIDE: B Natriuretic Peptide: 958 pg/mL — ABNORMAL HIGH (ref 0.0–100.0)

## 2017-01-25 LAB — FERRITIN: FERRITIN: 2 ng/mL — AB (ref 11–307)

## 2017-01-25 LAB — GLUCOSE, CAPILLARY: GLUCOSE-CAPILLARY: 121 mg/dL — AB (ref 65–99)

## 2017-01-25 LAB — CBG MONITORING, ED: Glucose-Capillary: 102 mg/dL — ABNORMAL HIGH (ref 65–99)

## 2017-01-25 LAB — VITAMIN B12: VITAMIN B 12: 897 pg/mL (ref 180–914)

## 2017-01-25 LAB — MAGNESIUM: Magnesium: 1.7 mg/dL (ref 1.7–2.4)

## 2017-01-25 LAB — ABO/RH: ABO/RH(D): A POS

## 2017-01-25 LAB — TROPONIN I

## 2017-01-25 MED ORDER — ALBUTEROL SULFATE (2.5 MG/3ML) 0.083% IN NEBU
5.0000 mg | INHALATION_SOLUTION | Freq: Once | RESPIRATORY_TRACT | Status: AC
  Administered 2017-01-25: 5 mg via RESPIRATORY_TRACT
  Filled 2017-01-25: qty 6

## 2017-01-25 MED ORDER — ASPIRIN 81 MG PO CHEW
324.0000 mg | CHEWABLE_TABLET | Freq: Once | ORAL | Status: AC
  Administered 2017-01-25: 324 mg via ORAL
  Filled 2017-01-25: qty 4

## 2017-01-25 MED ORDER — OXYCODONE HCL 5 MG PO TABS
10.0000 mg | ORAL_TABLET | Freq: Two times a day (BID) | ORAL | Status: DC | PRN
  Administered 2017-01-25 – 2017-01-29 (×3): 10 mg via ORAL
  Filled 2017-01-25 (×3): qty 2

## 2017-01-25 MED ORDER — PEG 3350-KCL-NA BICARB-NACL 420 G PO SOLR
2000.0000 mL | Freq: Once | ORAL | Status: AC
  Administered 2017-01-26: 2000 mL via ORAL
  Filled 2017-01-25: qty 4000

## 2017-01-25 MED ORDER — SODIUM CHLORIDE 0.9 % IV SOLN
Freq: Once | INTRAVENOUS | Status: AC
Start: 2017-01-25 — End: 2017-01-25
  Administered 2017-01-25: 21:00:00 via INTRAVENOUS

## 2017-01-25 MED ORDER — SODIUM CHLORIDE 0.9% FLUSH
3.0000 mL | Freq: Two times a day (BID) | INTRAVENOUS | Status: DC
  Administered 2017-01-25 – 2017-02-01 (×14): 3 mL via INTRAVENOUS

## 2017-01-25 MED ORDER — PEG 3350-KCL-NA BICARB-NACL 420 G PO SOLR
2000.0000 mL | Freq: Once | ORAL | Status: AC
  Administered 2017-01-25: 2000 mL via ORAL
  Filled 2017-01-25: qty 4000

## 2017-01-25 MED ORDER — SODIUM CHLORIDE 0.9 % IV SOLN: Freq: Once | INTRAVENOUS | Status: DC

## 2017-01-25 MED ORDER — ALPRAZOLAM 0.5 MG PO TABS
0.5000 mg | ORAL_TABLET | Freq: Every day | ORAL | Status: DC | PRN
  Administered 2017-01-25 – 2017-01-29 (×2): 0.5 mg via ORAL
  Filled 2017-01-25 (×2): qty 1

## 2017-01-25 MED ORDER — ACETAMINOPHEN 325 MG PO TABS
650.0000 mg | ORAL_TABLET | Freq: Four times a day (QID) | ORAL | Status: DC | PRN
  Administered 2017-01-27: 650 mg via ORAL
  Filled 2017-01-25: qty 2

## 2017-01-25 MED ORDER — PNEUMOCOCCAL VAC POLYVALENT 25 MCG/0.5ML IJ INJ
0.5000 mL | INJECTION | INTRAMUSCULAR | Status: AC
  Administered 2017-01-26: 0.5 mL via INTRAMUSCULAR
  Filled 2017-01-25: qty 0.5

## 2017-01-25 MED ORDER — ACETAMINOPHEN 650 MG RE SUPP: 650.0000 mg | Freq: Four times a day (QID) | RECTAL | Status: DC | PRN

## 2017-01-25 MED ORDER — ASPIRIN 81 MG PO CHEW
CHEWABLE_TABLET | ORAL | Status: AC
  Administered 2017-01-25: 81 mg
  Filled 2017-01-25: qty 1

## 2017-01-25 MED ORDER — SODIUM CHLORIDE 0.9 % IV SOLN
Freq: Once | INTRAVENOUS | Status: AC
  Administered 2017-01-25: 13:00:00 via INTRAVENOUS

## 2017-01-25 NOTE — ED Triage Notes (Signed)
Pt reports with fluid retention in ankles and feet x 1 week, also reports wheezing, non-productive cough, and SOB since she had the flu last month, no acute distress noted at this time, pt was scheduled to see DonDiego today for check-up and RX refills-appt cancelled by dr office due to death in family

## 2017-01-25 NOTE — ED Notes (Signed)
CRITICAL VALUE ALERT  Critical value received:  hgb 3.6  Date of notification:  01/25/17  Time of notification:  8833  Critical value read back: yes   Nurse who received alert:  r Kaiya Boatman RN  MD notified (1st page):  Dr Vanita Panda  Time of first page:  1005

## 2017-01-25 NOTE — Consult Note (Signed)
Referring Provider: [facility] Primary Care Physician:  Maricela Curet, MD Primary Gastroenterologist:  Dr. Oneida Alar (previously unassigned)  Date of Admission: 01/25/17 Date of Consultation: 01/25/17  Reason for Consultation:  Symptomatic anemia  HPI:  Brandi [name] is a 61 y.o. female with a past medical history of diabetes, hypertension, strok. ER provider notes and hospitalist notes reviewed.  She presented to the emergency department with a multi-day history of lower extremity edema, shortness of breath, fatigue. States she's been feeling bad for about a month. 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 Has noted some blood in her stool but very low volume. Denies previous colonoscopy and endoscopy. Found to have profound anemia in the emergency department with a hemoglobin of 3.6 which was confirmed. BNP mildly elevated at 958. Anemia panel pending. She was admitted to the ICU for further care.  Today she states she started just feeling not well over a month ago. Thought she had the flu. Has ahd decreased energy. Has had 2 episodes of small amount of rectal bleeding. Has needed transfusions in the past. Last colonoscopy 5 years ago by Dr. Arnoldo Morale (per the patient) but no report found in the chart. Denies melena, abdominal pain, N/V. Denies complications of sedation in the past. No other upper or lower GI complaints at this time.  Past Medical History:  Diagnosis Date  . Anemia   . Diabetes mellitus without complication ([place])   . Hypertension   . Stroke [facility]) 2013    Past Surgical History:  Procedure Laterality Date  . cyst removed from right breast    . KNEE SURGERY  right knee    Prior to Admission medications   Medication Sig Start Date End Date Taking? Authorizing Provider  ALPRAZolam Duanne Moron) 0.5 MG tablet Take 0.5 mg by mouth daily as needed for anxiety.   Yes Historical Provider, MD  aspirin 325 MG tablet Take 325 mg by mouth daily.    Yes Historical Provider, MD  lisinopril-hydrochlorothiazide (PRINZIDE,ZESTORETIC) 20-12.5 MG per tablet Take 1 tablet by mouth daily.   Yes Historical Provider, MD  metFORMIN (GLUCOPHAGE) 500 MG tablet Take 1 tablet by mouth 2 (two) times daily. 11/16/16  Yes Historical Provider, MD  metoprolol (LOPRESSOR) 100 MG tablet Take 100 mg by mouth 2 (two) times daily.   Yes Historical Provider, MD  Oxycodone HCl 10 MG TABS Take 1 tablet by mouth 2 (two) times daily as needed for pain. 12/26/16  Yes Historical Provider, MD  simvastatin (ZOCOR) 20 MG tablet Take 20 mg by mouth daily.   Yes Historical Provider, MD    Current Facility-Administered Medications  Medication Dose Route Frequency Provider Last Rate Last Dose  . acetaminophen (TYLENOL) tablet 650 mg  650 mg Oral Q6H PRN Samuella Cota, MD       Or  . acetaminophen (TYLENOL) suppository 650 mg  650 mg Rectal Q6H PRN Samuella Cota, MD      . ALPRAZolam Duanne Moron) tablet 0.5 mg  0.5 mg Oral Daily PRN Samuella Cota, MD      . oxyCODONE (Oxy IR/ROXICODONE) immediate release tablet 10 mg  10 mg Oral BID PRN Samuella Cota, MD      . Derrill Memo ON 01/26/2017] pneumococcal 23 valent vaccine (PNU-IMMUNE) injection 0.5 mL  0.5 mL Intramuscular Tomorrow-1000 Samuella Cota, MD      . sodium chloride flush (NS) 0.9 % injection 3 mL  3 mL Intravenous Q12H Samuella Cota, MD  3 mL at 01/25/17 1245    Allergies as of 01/25/2017 - Review Complete 01/25/2017  Allergen Reaction Noted  . Codeine  07/27/2011    Family History  Problem Relation Age of Onset  . Heart attack Father   . Ulcers Mother     stomach    Social History   Social History  . Marital status: Single    Spouse name: N/A  . Number of children: N/A  . Years of education: N/A   Occupational History  . Not on file.   Social History Main Topics  . Smoking status: Former Smoker    Packs/day: 1.00    Years: 25.00    Types: Cigarettes    Quit date: 2013  . Smokeless  tobacco: Never Used  . Alcohol use No  . Drug use: No     Comment: last used 20 yr  . Sexual activity: Yes   Other Topics Concern  . Not on file   Social History Narrative  . No narrative on file    Review of Systems: General: Negative for anorexia, weight loss, fever, chills. ENT: Negative for hoarseness, difficulty swallowing. CV: Negative for chest pain, angina, palpitations, peripheral edema.  Respiratory: Negative for dyspnea at rest, cough, sputum, wheezing.  GI: See history of present illness. Endo: Negative for unusual weight change.  Heme: Negative for bruising or bleeding. Allergy: Negative for rash or hives.  Physical Exam: Vital signs in last 24 hours: Temp:  [97.7 F (36.5 C)-98.5 F (36.9 C)] 98.4 F (36.9 C) (04/13 1515) Pulse Rate:  [53-86] 54 (04/13 1515) Resp:  [12-37] 14 (04/13 1500) BP: (99-156)/(24-85) 135/60 (04/13 1515) SpO2:  [73 %-100 %] 97 % (04/13 1515) FiO2 (%):  [100 %] 100 % (04/13 1152) Weight:  [274 lb (124.3 kg)-274 lb 7.6 oz (124.5 kg)] 274 lb 7.6 oz (124.5 kg) (04/13 1152) Last BM Date: 01/24/17 General:   Morbidly obese female. Alert,  Well-developed, well-nourished, pleasant and cooperative in NAD Head:  Normocephalic and atraumatic. Eyes:  Sclera clear, no icterus. Conjunctiva pink. Ears:  Normal auditory acuity. Neck:  Supple; no masses or thyromegaly. Lungs:  Clear throughout to auscultation. No wheezes, crackles, or rhonchi. No acute distress. Heart:  Regular rate and rhythm; no murmurs, clicks, rubs,  or gallops. Abdomen:  Obese but soft, nontender and nondistended. No masses, hepatosplenomegaly or hernias noted. Normal bowel sounds, without guarding, and without rebound.   Rectal:  Deferred until time of colonoscopy.   Msk:  Symmetrical without gross deformities. Pulses:  Normal bilateral DP pulses noted. Extremities:  Without clubbing or edema. Neurologic:  Alert and  oriented x4;  grossly normal neurologically. Skin:   Intact without significant lesions or rashes. Psych:  Alert and cooperative. Normal mood and affect.  Intake/Output from previous day: No intake/output data recorded. Intake/Output this shift: Total I/O In: 760 [P.O.:240; I.V.:250; Blood:270] Out: -   Lab Results:  Recent Labs  01/25/17 0918 01/25/17 1057  WBC 6.4 6.2  HGB 3.6* 3.5*  HCT 14.4* 14.1*  PLT 368 357   BMET  Recent Labs  01/25/17 0918  NA 138  K 3.6  CL 107  CO2 20*  GLUCOSE 126*  BUN 17  CREATININE 1.29*  CALCIUM 8.5*   LFT  Recent Labs  01/25/17 0918  PROT 6.7  ALBUMIN 3.2*  AST 27  ALT 16  ALKPHOS 61  BILITOT <0.1*   PT/INR No results for input(s): LABPROT, INR in the last 72 hours. Hepatitis Panel No results  for input(s): HEPBSAG, HCVAB, HEPAIGM, HEPBIGM in the last 72 hours. C-Diff No results for input(s): CDIFFTOX in the last 72 hours.  Studies/Results: Dg Chest 2 View  Result Date: 01/25/2017 CLINICAL DATA:  Chest pain, ankle and feet swelling for 1 week, wheezing EXAM: CHEST  2 VIEW COMPARISON:  06/23/2012 FINDINGS: Cardiomegaly is noted. No infiltrate or pulmonary edema. Suboptimal exam due to patient's large body habitus. Degenerative changes mid and lower thoracic spine. IMPRESSION: Cardiomegaly. No infiltrate or pulmonary edema. Degenerative changes mid and lower thoracic spine. Electronically Signed   By: Lahoma Crocker M.D.   On: 01/25/2017 10:25    Impression: 61 year old female presents complaining of 1 month of progressively "not feeling well" including weakness and fatigue. States she has had a previous colonoscopy with Dr. Arnoldo Morale in surgery but no report was located in our system. No obvious large amount of bleeding. Did have 2 episodes of minimal rectal bleeding. No hematemesis or melena. Has had transfusions in the past. Her vitals are currently stable. Hgb on admission 3.6 confirmed with a repeat of 3.5, normal platelets, hct 14.8 and 14.6. BUN normal.  Given presentation  and labs likely lower GI bleed versus upper GI bleed. Differentials include diverticular bleed, colon ulcers, polyps, less likely CRC, IBD.  Plan: 1. Plan TCS and EGD tomorrow morning, likely around 9 am 2. Will prep for TCS, split dose prep tonight and tomorrow early morning. 3. Clear liquids today and NPO after midnight 4. Monitor hgb and transfuse as necessary 5. Supportive measures    Proceed with TCS and EGD with Dr. Gala Romney in near future: the risks, benefits, and alternatives have been discussed with the patient in detail. The patient states understanding and desires to proceed.  Patient is on oxycodone and xanax at home. Morbidly obese. Due to polypharmacy, procedure will be planned on propofol/MAC.   Thank you for allowing [place] to participate in the care of Brandi [name]  Walden Field, DNP, AGNP-C Adult & Gerontological Nurse Practitioner [facility]    LOS: 0 days     01/25/2017, 3:34 PM

## 2017-01-25 NOTE — ED Notes (Signed)
Assumed care of patient from [place], [place]. Pt reports progressive worsening of shortness of breath, and generalized weakness. Pt talking with family, no distress, denies pain, VSS. Call bell within reach. Cardiac monitor in place. Awaiting lab results.

## 2017-01-25 NOTE — ED Notes (Signed)
CRITICAL VALUE ALERT  Critical value received:  HGB 3.5  Date of notification:  01/25/17  Time of notification:  6834  Critical value read back:YES  Nurse who received alert:  Noa Galvao RN  MD notified (1st page):  LOCKWOOD  Time of first page:  1120  MD notified (2nd page):  Time of second page:  Responding MD:  Vanita Panda  Time MD responded:  1962

## 2017-01-25 NOTE — H&P (Signed)
History and Physical  Brandi [name] QVZ:[id number] DOB: [birth year] DOA: 01/25/2017  PCP: Maricela Curet, MD  Patient coming from: home  Chief Complaint: SOB  HPI:  60 year old woman presented with several day history of swelling, shortness of breath, decreased energy level. Initial evaluation revealed profound symptomatic anemia and the patient was referred for further evaluation and transfusion.  Patient reports a history of requiring transfusion in the past, distantly. No further details available. She reports increasing shortness of breath over the last several days, fatigue, severe in intensity. No specific aggravating or alleviating factors. Associated with craving for ice. She also notes some blood in her stool but described as being very low volume. She denies previous colonoscopy or EGD. No chest pain or other complaints.  ED Course: At febrile, vital signs stable  Pertinent labs: Hemoglobin 3.6, confirmed. Creatinine 1.29. Remainder BMP unremarkable. Hepatic function panel unremarkable. BNP modestly elevated 958. Troponin negative. Imaging: CXR independently reviewed: No acute disease. I concur with radiologist interpretation.  Review of Systems:  Negative for fever, visual changes, sore throat, rash, new muscle aches, chest pain, dysuria, n/v/abdominal pain.  Past Medical History:  Diagnosis Date  . Anemia   . Diabetes mellitus without complication ([place])   . Hypertension   . Stroke [facility]) 2013    Past Surgical History:  Procedure Laterality Date  . cyst removed from right breast    . KNEE SURGERY  right knee     reports that she quit smoking about 5 years ago. Her smoking use included Cigarettes. She has a 25.00 pack-year smoking history. She has never used smokeless tobacco. She reports that she does not drink alcohol or use drugs. Mobility: Cane  Allergies  Allergen Reactions  . Codeine     Unknown     Family History  Problem Relation Age of Onset  .  Heart attack Father   . Ulcers Mother     stomach     Prior to Admission medications   Medication Sig Start Date End Date Taking? Authorizing Provider  ALPRAZolam Duanne Moron) 0.5 MG tablet Take 0.5 mg by mouth daily as needed for anxiety.   Yes Historical Provider, MD  aspirin 325 MG tablet Take 325 mg by mouth daily.   Yes Historical Provider, MD  lisinopril-hydrochlorothiazide (PRINZIDE,ZESTORETIC) 20-12.5 MG per tablet Take 1 tablet by mouth daily.   Yes Historical Provider, MD  metFORMIN (GLUCOPHAGE) 500 MG tablet Take 1 tablet by mouth 2 (two) times daily. 11/16/16  Yes Historical Provider, MD  metoprolol (LOPRESSOR) 100 MG tablet Take 100 mg by mouth 2 (two) times daily.   Yes Historical Provider, MD  Oxycodone HCl 10 MG TABS Take 1 tablet by mouth 2 (two) times daily as needed for pain. 12/26/16  Yes Historical Provider, MD  simvastatin (ZOCOR) 20 MG tablet Take 20 mg by mouth daily.   Yes Historical Provider, MD  albuterol (PROVENTIL HFA;VENTOLIN HFA) 108 (90 BASE) MCG/ACT inhaler Inhale 1-2 puffs into the lungs every 6 (six) hours as needed for wheezing. Patient not taking: Reported on 10/13/2014 02/04/12 02/03/13  Prentiss Bells, MD  hydrochlorothiazide (HYDRODIURIL) 25 MG tablet Take 1 tablet (25 mg total) by mouth daily. Patient not taking: Reported on 10/13/2014 02/04/12 02/03/13  Prentiss Bells, MD  metoprolol (LOPRESSOR) 100 MG tablet Take 1 tablet (100 mg total) by mouth 2 (two) times daily. Patient not taking: Reported on 10/13/2014 06/24/12 06/24/13  Kathie Dike, MD  simvastatin (ZOCOR) 20 MG tablet Take 1 tablet (20 mg total) by  mouth daily at 6 PM. Patient not taking: Reported on 10/13/2014 06/24/12 06/24/13  Kathie Dike, MD    Physical Exam: Vitals:   01/25/17 1200 01/25/17 1215 01/25/17 1230 01/25/17 1245  BP: 122/84 129/66 (!) 128/57 (!) 124/55  Pulse: (!) 53 (!) 53 (!) 54 86  Resp:      Temp:   98.1 F (36.7 C) 98.1 F (36.7 C)  TempSrc:   Oral   SpO2: 100% 100% 100%  (!) 89%  Weight:      Height:        Constitutional:  . Appears calm and comfortable Eyes:  . Pupils, irises, lids appear unremarkable. Conjunctiva are pale.  . ENT: Grossly normal hearing. Lips and tongue appear unremarkable Neck:  . No lymphadenopathy or masses  . no thyromegaly Respiratory:  . CTA bilaterally, no w/r/r.  . Respiratory effort normal.  Cardiovascular:  . RRR, no m/r/g . No LE extremity edema   Abdomen:  . Obese, soft, nontender . No hernias . No hepatomegaly  Musculoskeletal:  . Digits/nails hands: no clubbing, cyanosis, petechiae, infection . RUE, LUE, RLE, LLE   o strength and tone normal, no atrophy, no abnormal movements o No tenderness, masses Skin:  . No rashes, lesions, ulcers . palpation of skin: no induration or nodules Psychiatric:  . judgement and insight appear normal . Mental status o Mood, affect appropriate  Wt Readings from Last 3 Encounters:  01/25/17 124.5 kg (274 lb 7.6 oz)  10/13/14 127 kg (280 lb)  06/25/12 125.4 kg (276 lb 7.3 oz)    I have personally reviewed following labs and imaging studies  Labs on Admission:  CBC:  Recent Labs Lab 01/25/17 0918 01/25/17 1057  WBC 6.4 6.2  HGB 3.6* 3.5*  HCT 14.4* 14.1*  MCV 59.3* 59.0*  PLT 368 332   Basic Metabolic Panel:  Recent Labs Lab 01/25/17 0918  NA 138  K 3.6  CL 107  CO2 20*  GLUCOSE 126*  BUN 17  CREATININE 1.29*  CALCIUM 8.5*  MG 1.7   Liver Function Tests:  Recent Labs Lab 01/25/17 0918  AST 27  ALT 16  ALKPHOS 61  BILITOT <0.1*  PROT 6.7  ALBUMIN 3.2*     Recent Labs Lab 01/25/17 0855  GLUCAP 102*    Radiological Exams on Admission: Dg Chest 2 View  Result Date: 01/25/2017 CLINICAL DATA:  Chest pain, ankle and feet swelling for 1 week, wheezing EXAM: CHEST  2 VIEW COMPARISON:  06/23/2012 FINDINGS: Cardiomegaly is noted. No infiltrate or pulmonary edema. Suboptimal exam due to patient's large body habitus. Degenerative changes mid  and lower thoracic spine. IMPRESSION: Cardiomegaly. No infiltrate or pulmonary edema. Degenerative changes mid and lower thoracic spine. Electronically Signed   By: Lahoma Crocker M.D.   On: 01/25/2017 10:25    EKG: Independently reviewed: sinus bradycardia, nonspecific ST changes  Principal Problem:   Symptomatic anemia Active Problems:   Obesity   Microcytic anemia   DM type 2 (diabetes mellitus, type 2) (HCC)   Assessment/Plan 1. Profound microcytic symptomatic anemia with reported low-volume bleeding. Mild associated shortness of breath. Hemodynamics are stable. No chest pain. Does report a history previously of transfusion and history of pica. No previous GI workup now.  Plan transfuse 2 units PRBC, recheck CBC. Likely will need an additional 2 units which will be prepared.  GI consultation  Follow-up anemia panel  2. Diabetes mellitus type 2  Sliding scale insulin. Hold metformin.  3. Suspected chronic kidney disease  stage III 4. Obesity    It is my clinical opinion that referral for OBSERVATION is reasonable and necessary in this patient  The aforementioned taken together are felt to place the patient at high risk for further clinical deterioration. However it is anticipated that the patient may be medically stable for discharge from the hospital within 24 to 48 hours.   DVT prophylaxis:SCDs Code Status: full Family Communication: none Consults called: GI    Time spent: 50 minutes  Murray Hodgkins, MD  [facility] Direct contact: [phone] --[place] [place]  --[email or web address]; password [email or web address]  7PM-7AM contact night coverage as above  01/25/2017, 1:23 PM

## 2017-01-25 NOTE — ED Provider Notes (Signed)
[facility] DEPT Provider Note   CSN: [id number] Arrival date & time: 01/25/17  0818  By signing my name below, I, Higinio Plan, attest that this documentation has been prepared under the direction and in the presence of Carmin Muskrat, MD . Electronically Signed: Higinio Plan, Scribe. 01/25/2017. 8:50 AM.  History   Chief Complaint Chief Complaint  Patient presents with  . Cough    non-productive cough x 1 week  . Shortness of Breath    x 3 weeks   The history is provided by the patient. No language interpreter was used.   HPI Comments: Brandi [name] is a 61 y.o. female with PMHx of DM, HTN, and stroke, who presents to the Emergency Department complaining of gradual onset, fluid retention in her bilateral ankles and feet that began this morning. Pt reports she has "not felt well" for ~1 month with associated symptoms of cough, shortness of breath, wheezing, and fatigue. She states she was scheduled for a follow-up appointment with her PCP, Dr. Cindie Laroche, this morning but this was rescheduled due to a death in his family. She denies any fever or other complaints.   Past Medical History:  Diagnosis Date  . Anemia   . Diabetes mellitus without complication ([place])   . Hypertension   . Stroke [facility]) 2013    Patient Active Problem List   Diagnosis Date Noted  . CVA (cerebral vascular accident) ([place]) 06/23/2012  . HTN (hypertension) 06/23/2012  . Obesity 06/23/2012  . Expressive aphasia 06/23/2012  . Tobacco abuse 06/23/2012    Past Surgical History:  Procedure Laterality Date  . cyst removed from right breast    . KNEE SURGERY  right knee    OB History    No data available     Home Medications    Prior to Admission medications   Medication Sig Start Date End Date Taking? Authorizing Provider  albuterol (PROVENTIL HFA;VENTOLIN HFA) 108 (90 BASE) MCG/ACT inhaler Inhale 1-2 puffs into the lungs every 6 (six) hours as needed for wheezing. Patient not taking: Reported on  10/13/2014 02/04/12 02/03/13  Prentiss Bells, MD  ALPRAZolam Duanne Moron) 0.5 MG tablet Take 0.5 mg by mouth daily as needed for anxiety.    Historical Provider, MD  famotidine (PEPCID) 20 MG tablet Take 1 tablet (20 mg total) by mouth 2 (two) times daily. 10/13/14   Nat Christen, MD  hydrochlorothiazide (HYDRODIURIL) 25 MG tablet Take 1 tablet (25 mg total) by mouth daily. Patient not taking: Reported on 10/13/2014 02/04/12 02/03/13  Prentiss Bells, MD  lisinopril-hydrochlorothiazide (PRINZIDE,ZESTORETIC) 20-12.5 MG per tablet Take 1 tablet by mouth daily.    Historical Provider, MD  metoprolol (LOPRESSOR) 100 MG tablet Take 1 tablet (100 mg total) by mouth 2 (two) times daily. Patient not taking: Reported on 10/13/2014 06/24/12 06/24/13  Kathie Dike, MD  metoprolol (LOPRESSOR) 100 MG tablet Take 100 mg by mouth 2 (two) times daily.    Historical Provider, MD  promethazine (PHENERGAN) 25 MG tablet Take 1 tablet (25 mg total) by mouth every 6 (six) hours as needed. 10/13/14   Nat Christen, MD  simvastatin (ZOCOR) 20 MG tablet Take 1 tablet (20 mg total) by mouth daily at 6 PM. Patient not taking: Reported on 10/13/2014 06/24/12 06/24/13  Kathie Dike, MD  simvastatin (ZOCOR) 20 MG tablet Take 20 mg by mouth daily.    Historical Provider, MD  traMADol (ULTRAM) 50 MG tablet Take 50-100 mg by mouth every 6 (six) hours as needed (pain).  Historical Provider, MD    Family History History reviewed. No pertinent family history.  Social History Social History  Substance Use Topics  . Smoking status: Former Smoker    Packs/day: 1.00    Years: 25.00    Types: Cigarettes    Quit date: 2013  . Smokeless tobacco: Never Used  . Alcohol use 0.6 oz/week    1 Glasses of wine per week   Allergies   Codeine  Review of Systems Review of Systems  Constitutional: Positive for fatigue. Negative for fever.  Respiratory: Positive for cough, shortness of breath and wheezing.   Cardiovascular: Positive for leg  swelling.  All other systems reviewed and are negative.  On secondary conversation patient acknowledges seeing blood in her stool. She also acknowledges prior blood transfusion.   Physical Exam Updated Vital Signs BP (!) 145/47 (BP Location: Left Arm)   Pulse (!) 56   Temp 97.7 F (36.5 C) (Oral)   Resp 20   Ht 5\' 4"  (1.626 m)   Wt 274 lb (124.3 kg)   SpO2 100%   BMI 47.03 kg/m   Physical Exam  Constitutional: She is oriented to person, place, and time. She appears well-developed and well-nourished. No distress.  HENT:  Head: Normocephalic and atraumatic.  Eyes: Conjunctivae and EOM are normal.  Cardiovascular: Normal rate and regular rhythm.   Pulmonary/Chest: Effort normal and breath sounds normal. No stridor. No respiratory distress.  Abdominal: She exhibits no distension.  Genitourinary: Rectal exam shows guaiac positive stool. Rectal exam shows no mass and anal tone normal.  Musculoskeletal: She exhibits no edema.  Neurological: She is alert and oriented to person, place, and time. No cranial nerve deficit.  Skin: Skin is warm and dry.  Psychiatric: She has a normal mood and affect.  Nursing note and vitals reviewed.   ED Treatments / Results  DIAGNOSTIC STUDIES:  Oxygen Saturation is 100% on RA, normal by my interpretation.    COORDINATION OF CARE:  8:50 AM Discussed treatment plan with pt at bedside and pt agreed to plan.  Labs (all labs ordered are listed, but only abnormal results are displayed) Labs Reviewed  COMPREHENSIVE METABOLIC PANEL - Abnormal; Notable for the following:       Result Value   CO2 20 (*)    Glucose, Bld 126 (*)    Creatinine, Ser 1.29 (*)    Calcium 8.5 (*)    Albumin 3.2 (*)    Total Bilirubin <0.1 (*)    GFR calc non Af Amer 44 (*)    GFR calc Af Amer 51 (*)    All other components within normal limits  CBC - Abnormal; Notable for the following:    RBC 2.43 (*)    Hemoglobin 3.6 (*)    HCT 14.4 (*)    MCV 59.3 (*)    MCH  14.8 (*)    MCHC 25.0 (*)    RDW 22.8 (*)    All other components within normal limits  BRAIN NATRIURETIC PEPTIDE - Abnormal; Notable for the following:    B Natriuretic Peptide 958.0 (*)    All other components within normal limits  CBG MONITORING, ED - Abnormal; Notable for the following:    Glucose-Capillary 102 (*)    All other components within normal limits  POC OCCULT BLOOD, ED - Abnormal; Notable for the following:    Fecal Occult Bld POSITIVE (*)    All other components within normal limits  MAGNESIUM  TROPONIN I  CBG MONITORING,  ED  TYPE AND SCREEN  PREPARE RBC (CROSSMATCH)   Initial labs notable for hemoglobin critically abnormal, 3.9. With Hemoccult-positive status, prior transfusion, patient has had type and screen, will begin to receive blood transfusion here in the emergency department.  EKG  EKG Interpretation  Date/Time:  Friday January 25 2017 08:39:05 EDT Ventricular Rate:  56 PR Interval:    QRS Duration: 103 QT Interval:  468 QTC Calculation: 452 R Axis:   5 Text Interpretation:  Sinus rhythm Low voltage, precordial leads T wave abnormality Artifact Abnormal ekg Confirmed by Carmin Muskrat  MD [phone]) on 01/25/2017 9:21:21 AM       Radiology Dg Chest 2 View  Result Date: 01/25/2017 CLINICAL DATA:  Chest pain, ankle and feet swelling for 1 week, wheezing EXAM: CHEST  2 VIEW COMPARISON:  06/23/2012 FINDINGS: Cardiomegaly is noted. No infiltrate or pulmonary edema. Suboptimal exam due to patient's large body habitus. Degenerative changes mid and lower thoracic spine. IMPRESSION: Cardiomegaly. No infiltrate or pulmonary edema. Degenerative changes mid and lower thoracic spine. Electronically Signed   By: Lahoma Crocker M.D.   On: 01/25/2017 10:25    Procedures Procedures (including critical care time)  Medications Ordered in ED Medications  0.9 %  sodium chloride infusion (not administered)  aspirin chewable tablet 324 mg (324 mg Oral Given 01/25/17 0945)    albuterol (PROVENTIL) (2.5 MG/3ML) 0.083% nebulizer solution 5 mg (5 mg Nebulization Given 01/25/17 0921)  aspirin 81 MG chewable tablet (81 mg  Given 01/25/17 0949)     Initial Impression / Assessment and Plan / ED Course  I have reviewed the triage vital signs and the nursing notes.  Pertinent labs & imaging results that were available during my care of the patient were reviewed by me and considered in my medical decision making (see chart for details).     I personally performed the services described in this documentation, which was scribed in my presence. The recorded information has been reviewed and is accurate.    This obese elderly female presents with multiple complaints, but seems to be mostly concern of new weakness, over the past month. Here the patient is awake and alert, but is found to have critically abnormal hemoglobin value, as well as spot Hemoccult-positive stool.  Given the patient's cardiomegaly, concern for CHF, patient will require close monitoring during blood transfusion. Patient was admitted for further evaluation and management.    Final Clinical Impressions(s) / ED Diagnoses  Weakness Symptomatic anemia GI bleed Elevated BNP   CRITICAL CARE Performed by: Carmin Muskrat Total critical care time: 35 minutes Critical care time was exclusive of separately billable procedures and treating other patients. Critical care was necessary to treat or prevent imminent or life-threatening deterioration. Critical care was time spent personally by me on the following activities: development of treatment plan with patient and/or surrogate as well as nursing, discussions with consultants, evaluation of patient's response to treatment, examination of patient, obtaining history from patient or surrogate, ordering and performing treatments and interventions, ordering and review of laboratory studies, ordering and review of radiographic studies, pulse oximetry and  re-evaluation of patient's condition.    Carmin Muskrat, MD 01/25/17 1045

## 2017-01-25 NOTE — ED Notes (Signed)
Dropped 1 aspirin on the floor. Overrided system to get 1 more 81mg  aspirin to give patient

## 2017-01-25 NOTE — ED Notes (Signed)
Pt. Being taken to xray.

## 2017-01-26 ENCOUNTER — Encounter (HOSPITAL_COMMUNITY): Payer: Self-pay | Admitting: *Deleted

## 2017-01-26 ENCOUNTER — Observation Stay (HOSPITAL_COMMUNITY): Payer: Medicare HMO | Admitting: Anesthesiology

## 2017-01-26 ENCOUNTER — Encounter (HOSPITAL_COMMUNITY)
Admission: EM | Disposition: A | Discharge status: Home / Self Care | Payer: Self-pay | Source: Home / Self Care | Attending: Family Medicine

## 2017-01-26 DIAGNOSIS — Z23 Encounter for immunization: Secondary | ICD-10-CM | POA: Diagnosis present

## 2017-01-26 DIAGNOSIS — I10 Essential (primary) hypertension: Secondary | ICD-10-CM | POA: Diagnosis present

## 2017-01-26 DIAGNOSIS — D128 Benign neoplasm of rectum: Secondary | ICD-10-CM

## 2017-01-26 DIAGNOSIS — Z7982 Long term (current) use of aspirin: Secondary | ICD-10-CM | POA: Diagnosis not present

## 2017-01-26 DIAGNOSIS — K6389 Other specified diseases of intestine: Secondary | ICD-10-CM | POA: Diagnosis present

## 2017-01-26 DIAGNOSIS — Z87891 Personal history of nicotine dependence: Secondary | ICD-10-CM | POA: Diagnosis not present

## 2017-01-26 DIAGNOSIS — D649 Anemia, unspecified: Secondary | ICD-10-CM | POA: Diagnosis not present

## 2017-01-26 DIAGNOSIS — K921 Melena: Secondary | ICD-10-CM | POA: Diagnosis present

## 2017-01-26 DIAGNOSIS — Z7984 Long term (current) use of oral hypoglycemic drugs: Secondary | ICD-10-CM | POA: Diagnosis not present

## 2017-01-26 DIAGNOSIS — R0602 Shortness of breath: Secondary | ICD-10-CM | POA: Diagnosis present

## 2017-01-26 DIAGNOSIS — D1330 Benign neoplasm of unspecified part of small intestine: Secondary | ICD-10-CM | POA: Diagnosis present

## 2017-01-26 DIAGNOSIS — Z6841 Body Mass Index (BMI) 40.0 and over, adult: Secondary | ICD-10-CM | POA: Diagnosis not present

## 2017-01-26 DIAGNOSIS — Z79899 Other long term (current) drug therapy: Secondary | ICD-10-CM | POA: Diagnosis not present

## 2017-01-26 DIAGNOSIS — K640 First degree hemorrhoids: Secondary | ICD-10-CM | POA: Diagnosis present

## 2017-01-26 DIAGNOSIS — Z8673 Personal history of transient ischemic attack (TIA), and cerebral infarction without residual deficits: Secondary | ICD-10-CM | POA: Diagnosis not present

## 2017-01-26 DIAGNOSIS — D500 Iron deficiency anemia secondary to blood loss (chronic): Secondary | ICD-10-CM | POA: Diagnosis present

## 2017-01-26 DIAGNOSIS — E119 Type 2 diabetes mellitus without complications: Secondary | ICD-10-CM | POA: Diagnosis present

## 2017-01-26 HISTORY — PX: ESOPHAGOGASTRODUODENOSCOPY (EGD) WITH PROPOFOL: SHX5813

## 2017-01-26 HISTORY — PX: POLYPECTOMY: SHX5525

## 2017-01-26 HISTORY — PX: COLONOSCOPY WITH PROPOFOL: SHX5780

## 2017-01-26 LAB — CBC
HCT: 27.8 % — ABNORMAL LOW (ref 36.0–46.0)
Hemoglobin: 8.2 g/dL — ABNORMAL LOW (ref 12.0–15.0)
MCH: 20.6 pg — AB (ref 26.0–34.0)
MCHC: 29.5 g/dL — AB (ref 30.0–36.0)
MCV: 69.8 fL — ABNORMAL LOW (ref 78.0–100.0)
PLATELETS: 350 10*3/uL (ref 150–400)
RBC: 3.98 MIL/uL (ref 3.87–5.11)
RDW: 27.3 % — AB (ref 11.5–15.5)
WBC: 7.5 10*3/uL (ref 4.0–10.5)

## 2017-01-26 LAB — GLUCOSE, CAPILLARY
GLUCOSE-CAPILLARY: 71 mg/dL (ref 65–99)
GLUCOSE-CAPILLARY: 89 mg/dL (ref 65–99)
GLUCOSE-CAPILLARY: 95 mg/dL (ref 65–99)
GLUCOSE-CAPILLARY: 72 mg/dL (ref 65–99)
GLUCOSE-CAPILLARY: 86 mg/dL (ref 65–99)
Glucose-Capillary: 123 mg/dL — ABNORMAL HIGH (ref 65–99)
Glucose-Capillary: 127 mg/dL — ABNORMAL HIGH (ref 65–99)
Glucose-Capillary: 73 mg/dL (ref 65–99)
Glucose-Capillary: 68 mg/dL (ref 65–99)

## 2017-01-26 LAB — FOLATE: FOLATE: 9.6 ng/mL (ref >5.9)

## 2017-01-26 SURGERY — COLONOSCOPY WITH PROPOFOL
Anesthesia: Monitor Anesthesia Care

## 2017-01-26 MED ORDER — PROPOFOL 500 MG/50ML IV EMUL
INTRAVENOUS | Status: DC | PRN
  Administered 2017-01-26: 20 ug/kg/min via INTRAVENOUS
  Administered 2017-01-26: 75 ug/kg/min via INTRAVENOUS

## 2017-01-26 MED ORDER — LACTATED RINGERS IV SOLN
INTRAVENOUS | Status: DC | PRN
  Administered 2017-01-26: 11:00:00 via INTRAVENOUS

## 2017-01-26 MED ORDER — MIDAZOLAM HCL 5 MG/5ML IJ SOLN
INTRAMUSCULAR | Status: DC | PRN
  Administered 2017-01-26: 1 mg via INTRAVENOUS

## 2017-01-26 MED ORDER — METOCLOPRAMIDE HCL 5 MG/ML IJ SOLN
5.0000 mg | Freq: Once | INTRAMUSCULAR | Status: AC
  Administered 2017-01-27: 5 mg via INTRAVENOUS
  Filled 2017-01-26: qty 2
  Filled 2017-01-26: qty 1

## 2017-01-26 MED ORDER — PROPOFOL 10 MG/ML IV BOLUS
INTRAVENOUS | Status: DC | PRN
  Administered 2017-01-26 (×2): 10 mg via INTRAVENOUS
  Administered 2017-01-26: 20 mg via INTRAVENOUS

## 2017-01-26 MED ORDER — DEXTROSE 50 % IV SOLN
INTRAVENOUS | Status: DC | PRN
  Administered 2017-01-26: 12.5 g via INTRAVENOUS

## 2017-01-26 MED ORDER — DEXTROSE 50 % IV SOLN
INTRAVENOUS | Status: AC
  Filled 2017-01-26: qty 50

## 2017-01-26 MED ORDER — MIDAZOLAM HCL 2 MG/2ML IJ SOLN
INTRAMUSCULAR | Status: AC
  Filled 2017-01-26: qty 2

## 2017-01-26 MED ORDER — LIDOCAINE VISCOUS 2 % MT SOLN
OROMUCOSAL | Status: AC
  Filled 2017-01-26: qty 15

## 2017-01-26 NOTE — H&P (Signed)
Patient seen and examined in short stay. Patient admitted with very impressive iron deficiency anemia. Some blood per rectum as described.  Agree with need for diagnostic EGD and colonoscopy. The patient denies dysphagia GERD, EPIGASTRIC pain.  I have offered the patient a diagnostic EGD and colonoscopy today under deep sedation with propofol. The risks, benefits, limitations, imponderables and alternatives regarding both EGD and colonoscopy have been reviewed with the patient. Questions have been answered. All parties agreeable.

## 2017-01-26 NOTE — Progress Notes (Signed)
Brandi [name] VXB:[id number] DOB: 1956/06/21 DOA: 01/25/2017 PCP: Barney Drain, MD   Subjective: This 61 year old lady was admitted with a approximately 3 month history of PICA and was found to have severe anemia with a hemoglobin of 3.5. This was microcytic and iron studies are consistent with iron deficiency. She has now had a total of 4 units blood transfusion and has been seen by gastroenterology and is awaiting endoscopy this morning. She feels somewhat better. She describes intermittent rectal bleeding but there may also be occult  bleeding that she is not aware of.           Physical Exam: Blood pressure 126/61, pulse (!) 50, temperature 97.6 F (36.4 C), temperature source Axillary, resp. rate 14, height 5\' 4"  (1.626 m), weight 129.2 kg (284 lb 13.4 oz), SpO2 100 %. She is hemodynamically stable. She looks pale still. Heart sounds are present without murmurs. Lung fields are clear. Abdomen is soft and nontender. I cannot appreciate any specific masses. There is no hepatosplenomegaly. She is alert and orientated and there are no obvious focal neurological signs.   Investigations:  Recent Results (from the past 240 hour(s))  MRSA PCR Screening     Status: None   Collection Time: 01/25/17 11:30 AM  Result Value Ref Range Status   MRSA by PCR NEGATIVE NEGATIVE Final    Comment:        The GeneXpert MRSA Assay (FDA approved for NASAL specimens only), is one component of a comprehensive MRSA colonization surveillance program. It is not intended to diagnose MRSA infection nor to guide or monitor treatment for MRSA infections.      Basic Metabolic Panel:  Recent Labs  01/25/17 0918  NA 138  K 3.6  CL 107  CO2 20*  GLUCOSE 126*  BUN 17  CREATININE 1.29*  CALCIUM 8.5*  MG 1.7   Liver Function Tests:  Recent Labs  01/25/17 0918  AST 27  ALT 16  ALKPHOS 61  BILITOT <0.1*  PROT 6.7  ALBUMIN 3.2*     CBC:  Recent Labs  01/25/17 1057  01/25/17 1913  WBC 6.2 5.9  HGB 3.5* 5.9*  HCT 14.1* 21.9*  MCV 59.0* 66.6*  PLT 357 351    Dg Chest 2 View  Result Date: 01/25/2017 CLINICAL DATA:  Chest pain, ankle and feet swelling for 1 week, wheezing EXAM: CHEST  2 VIEW COMPARISON:  06/23/2012 FINDINGS: Cardiomegaly is noted. No infiltrate or pulmonary edema. Suboptimal exam due to patient's large body habitus. Degenerative changes mid and lower thoracic spine. IMPRESSION: Cardiomegaly. No infiltrate or pulmonary edema. Degenerative changes mid and lower thoracic spine. Electronically Signed   By: Lahoma Crocker M.D.   On: 01/25/2017 10:25      Medications: I have reviewed the patient's current medications.  Impression: 1. Microcytic symptomatic anemia, status post 4 units blood transfusion. 2. Probable slow GI bleed.     Plan: 1. Continue to monitor closely with CBC and hemodynamics. There is no evidence of acute GI bleed at the present time. 2. Endoscopy today consisting of EGD and colonoscopy.  Consultants:  Gastroneurology.   Procedures:  None.   Antibiotics:  None.                   Code Status: Full code.  Family Communication: I discussed the plan with the patient at the bedside.   Disposition Plan: Home when medically stable.  Time spent: 15 minutes.   LOS: 0 days   [place] C  01/26/2017, 9:11 AM

## 2017-01-26 NOTE — Anesthesia Postprocedure Evaluation (Signed)
Anesthesia Post Note  Patient: Brandi [name]  Procedure(s) Performed: Procedure(s) (LRB): COLONOSCOPY WITH PROPOFOL (N/A) ESOPHAGOGASTRODUODENOSCOPY (EGD) WITH PROPOFOL (N/A) POLYPECTOMY  Patient location during evaluation: ICU Anesthesia Type: MAC Level of consciousness: awake and alert Pain management: satisfactory to patient Vital Signs Assessment: post-procedure vital signs reviewed and stable Respiratory status: spontaneous breathing Cardiovascular status: stable Anesthetic complications: no     Last Vitals:  Vitals:   01/26/17 1145 01/26/17 1200  BP: (!) 142/76 133/61  Pulse: (!) 50 (!) 49  Resp: 12 19  Temp: 36.6 C     Last Pain:  Vitals:   01/26/17 1050  TempSrc: Oral  PainSc:                  [name]

## 2017-01-26 NOTE — Transfer of Care (Signed)
Immediate Anesthesia Transfer of Care Note  Patient: Brandi [name]  Procedure(s) Performed: Procedure(s): COLONOSCOPY WITH PROPOFOL (N/A) ESOPHAGOGASTRODUODENOSCOPY (EGD) WITH PROPOFOL (N/A)  Patient Location: PACU  Anesthesia Type:MAC  Level of Consciousness: awake and alert   Airway & Oxygen Therapy: Patient Spontanous Breathing and Patient connected to nasal cannula oxygen  Post-op Assessment: Report given to RN and Post -op Vital signs reviewed and stable  Post vital signs: Reviewed and stable  Last Vitals:  Vitals:   01/26/17 1044 01/26/17 1050  BP:  92/61  Pulse:  (!) 54  Resp:  (!) 22  Temp: 36.9 C     Last Pain:  Vitals:   01/26/17 1050  TempSrc: Oral  PainSc:       Patients Stated Pain Goal: 7 (02/33/43 5686)  Complications: No apparent anesthesia complications

## 2017-01-26 NOTE — Op Note (Signed)
[facility] Patient Name: Brandi [name] Procedure Date: 01/26/2017 10:18 AM MRN: [id number] Date of Birth: 12-19-1955 Attending MD: Norvel Richards , MD CSN: [id number] Age: 61 Admit Type: Inpatient Procedure:                Colonoscopy with snare polypectomy Indications:              Hematochezia, Iron deficiency anemia Providers:                Norvel Richards, MD, Lurline Del, RN, Aram Candela Referring MD:              Medicines:                Propofol per Anesthesia Complications:            No immediate complications. Estimated Blood Loss:     Estimated blood loss was minimal. Procedure:                Pre-Anesthesia Assessment:                           - Prior to the procedure, a History and Physical                            was performed, and patient medications and                            allergies were reviewed. The patient's tolerance of                            previous anesthesia was also reviewed. The risks                            and benefits of the procedure and the sedation                            options and risks were discussed with the patient.                            All questions were answered, and informed consent                            was obtained. Prior Anticoagulants: The patient has                            taken no previous anticoagulant or antiplatelet                            agents. ASA Grade Assessment: III - A patient with                            severe systemic disease. After reviewing the risks  and benefits, the patient was deemed in                            satisfactory condition to undergo the procedure.                           After obtaining informed consent, the colonoscope                            was passed under direct vision. Throughout the                            procedure, the patient's blood pressure, pulse, and           oxygen saturations were monitored continuously. The                            EC-[id number] ([id number]) scope was introduced through                            the anus and advanced to the the cecum, identified                            by appendiceal orifice and ileocecal valve. The                            ileocecal valve, appendiceal orifice, and rectum                            were photographed. The entire colon was well                            visualized. The terminal ileum, ileocecal valve,                            appendiceal orifice, and rectum were photographed.                            The colonoscopy was performed without difficulty.                            The patient tolerated the procedure well. The                            quality of the bowel preparation was adequate. Scope In: 11:05:58 AM Scope Out: 11:09:52 AM Total Procedure Duration: 0 hours 3 minutes 54 seconds  Findings:      The perianal and digital rectal examinations were normal.      Two semi-pedunculated polyps were found in the rectum and sigmoid colon.       The polyps were 3 to 4 mm in size. These polyps were removed with a cold       snare. Resection and retrieval were complete. Estimated blood loss was       minimal.      Internal hemorrhoids were found during retroflexion. The hemorrhoids  were Grade I (internal hemorrhoids that do not prolapse). Mild melanosis       coli. NO TUMOR SEEN. Impression:               - Two 3 to 4 mm polyps in the rectum and in the                            sigmoid colon, removed with a cold snare. Resected                            and retrieved.                           - Internal hemorrhoids. Mild melanosis coli;                            Nothing found in upper or lower GI tract to explain                            iron deficiency anemia Moderate Sedation:      Moderate (conscious) sedation was personally administered by an       anesthesia  professional. The following parameters were monitored: oxygen       saturation, heart rate, blood pressure, respiratory rate, EKG, adequacy       of pulmonary ventilation, and response to care. Total physician       intraservice time was 39 minutes. Recommendation:           - Return patient to ICU for ongoing care.                           - Clear liquid diet. See EGD report. Plan for video                            study small intestine tomorrow.                           - Continue present medications.                           - Repeat colonoscopy date to be determined after                            pending pathology results are reviewed for                            surveillance based on pathology results. Procedure Code(s):        --- Professional ---                           [phone], Colonoscopy, flexible; with removal of                            tumor(s), polyp(s), or other lesion(s) by snare  technique Diagnosis Code(s):        --- Professional ---                           K62.1, Rectal polyp                           D12.5, Benign neoplasm of sigmoid colon                           K64.0, First degree hemorrhoids                           K92.1, Melena (includes Hematochezia)                           D50.9, Iron deficiency anemia, unspecified CPT copyright 2016 American Medical Association. All rights reserved. The codes documented in this report are preliminary and upon coder review may  be revised to meet current compliance requirements. [name]. [name], MD Norvel Richards, MD 01/26/2017 12:05:45 PM This report has been signed electronically. Number of Addenda: 0

## 2017-01-26 NOTE — Anesthesia Preprocedure Evaluation (Signed)
Anesthesia Evaluation  Patient identified by MRN, date of birth, ID band Patient awake    Reviewed: Allergy & Precautions, NPO status , Patient's Chart, lab work & pertinent test results  Airway Mallampati: II  TM Distance: >3 FB Neck ROM: Full    Dental  (+) Poor Dentition, Dental Advisory Given,    Pulmonary former smoker,           Cardiovascular Exercise Tolerance: Poor hypertension, Pt. on medications      Neuro/Psych Right side weakness CVA, Residual Symptoms    GI/Hepatic   Endo/Other  diabetes, Poorly Controlled, Type 2, Insulin Dependent  Renal/GU      Musculoskeletal   Abdominal   Peds  Hematology   Anesthesia Other Findings   Reproductive/Obstetrics                             Anesthesia Physical Anesthesia Plan  ASA: III and emergent  Anesthesia Plan: MAC   Post-op Pain Management:    Induction: Intravenous  Airway Management Planned: Simple Face Mask  Additional Equipment:   Intra-op Plan:   Post-operative Plan:   Informed Consent: I have reviewed the patients History and Physical, chart, labs and discussed the procedure including the risks, benefits and alternatives for the proposed anesthesia with the patient or authorized representative who has indicated his/her understanding and acceptance.   Dental advisory given  Plan Discussed with: Surgeon  Anesthesia Plan Comments:         Anesthesia Quick Evaluation

## 2017-01-26 NOTE — Op Note (Signed)
[place] Patient Name: Brandi [name] Procedure Date: 01/26/2017 11:20 AM MRN: [id number] Date of Birth: 04/27/1956 Attending MD: Norvel Richards , MD CSN: [id number] Age: 61 Admit Type: Inpatient Procedure:                Upper GI endoscopy - diagnostic Indications:              Profund Iron deficiency anemia, Hematochezia Providers:                Norvel Richards, MD, Lurline Del, RN, Aram Candela Referring MD:              Medicines:                Propofol per Anesthesia Complications:            No immediate complications. Estimated Blood Loss:     Estimated blood loss: none. Procedure:                Pre-Anesthesia Assessment:                           - Prior to the procedure, a History and Physical                            was performed, and patient medications and                            allergies were reviewed. The patient's tolerance of                            previous anesthesia was also reviewed. The risks                            and benefits of the procedure and the sedation                            options and risks were discussed with the patient.                            All questions were answered, and informed consent                            was obtained. Prior Anticoagulants: The patient has                            taken no previous anticoagulant or antiplatelet                            agents. ASA Grade Assessment: III - A patient with                            severe systemic disease. After reviewing the risks  and benefits, the patient was deemed in                            satisfactory condition to undergo the procedure.                           After obtaining informed consent, the endoscope was                            passed under direct vision. Throughout the                            procedure, the patient's blood pressure, pulse, and                             oxygen saturations were monitored continuously. The                            [id number] ([id number]) was introduced through the and                            advanced to the second part of duodenum. The upper                            GI endoscopy was accomplished without difficulty.                            The patient tolerated the procedure well. Scope In: 11:05:00 AM Scope Out: 11:09:09 AM Total Procedure Duration: 0 hours 4 minutes 9 seconds  Findings:      The examined esophagus was normal.      The entire examined stomach was normal.      The duodenal bulb and second portion of the duodenum were normal. Impression:               - Normal esophagus.                           - Normal stomach.                           - Normal duodenal bulb and second portion of the                            duodenum.                           - No specimens collected. Moderate Sedation:      Moderate (conscious) sedation was personally administered by an       anesthesia professional. The following parameters were monitored: oxygen       saturation, heart rate, blood pressure, respiratory rate, EKG, adequacy       of pulmonary ventilation, and response to care. Total physician       intraservice time was 9 minutes. Recommendation:           - Patient has a contact number available for  emergencies. The signs and symptoms of potential                            delayed complications were discussed with the                            patient. Return to normal activities tomorrow.                            Written discharge instructions were provided to the                            patient.                           - Clear liquid diet. See colonoscpy report.                           - Return patient to ICU for ongoing care. Video                            capsule study of ll bowel schedulr tomorrow.                           - Continue present  medications. Procedure Code(s):        --- Professional ---                           [phone], Esophagogastroduodenoscopy, flexible,                            transoral; diagnostic, including collection of                            specimen(s) by brushing or washing, when performed                            (separate procedure) Diagnosis Code(s):        --- Professional ---                           D50.9, Iron deficiency anemia, unspecified                           K92.1, Melena (includes Hematochezia) CPT copyright 2016 [employer]. All rights reserved. The codes documented in this report are preliminary and upon coder review may  be revised to meet current compliance requirements. [name]. Marvetta Vohs, MD Norvel Richards, MD 01/26/2017 11:55:43 AM This report has been signed electronically. Number of Addenda: 0

## 2017-01-26 NOTE — Progress Notes (Signed)
Pt received 2 units of blood on 1st shift 01/25/18. Hgb came up from 3.5 to 5.9 Paged the med-level. Orders to transfuse an additional 2 units of blood. Just finished last bag of RBCs at 0400, CBC to be drawn at 0500.  Pt has not any changes from baseline and vitals are stable. She has been sinus brady but she is not symptomatic. Will continue to monitor.  Ericka Pontiff, RN 4:11 AM 01/26/17

## 2017-01-26 NOTE — Progress Notes (Signed)
Dr. Anastasio Champion made aware pt's heart rate remains high 40's (current 49 BPM), sinus rhythm. No new orders received at this time.

## 2017-01-26 NOTE — Anesthesia Procedure Notes (Signed)
Procedure Name: MAC Date/Time: 01/26/2017 10:52 AM Performed by: Vista Deck Pre-anesthesia Checklist: Patient identified, Emergency Drugs available, Suction available, Timeout performed and Patient being monitored Patient Re-evaluated:Patient Re-evaluated prior to inductionOxygen Delivery Method: Non-rebreather mask

## 2017-01-27 ENCOUNTER — Encounter (HOSPITAL_COMMUNITY)
Admission: EM | Disposition: A | Discharge status: Home / Self Care | Payer: Self-pay | Source: Home / Self Care | Attending: Family Medicine

## 2017-01-27 HISTORY — PX: GIVENS CAPSULE STUDY: SHX5432

## 2017-01-27 LAB — CBC
HEMATOCRIT: 24.1 % — AB (ref 36.0–46.0)
HEMOGLOBIN: 7 g/dL — AB (ref 12.0–15.0)
MCH: 20.2 pg — ABNORMAL LOW (ref 26.0–34.0)
MCHC: 29 g/dL — ABNORMAL LOW (ref 30.0–36.0)
MCV: 69.5 fL — ABNORMAL LOW (ref 78.0–100.0)
Platelets: 289 10*3/uL (ref 150–400)
RBC: 3.47 MIL/uL — AB (ref 3.87–5.11)
RDW: 28 % — ABNORMAL HIGH (ref 11.5–15.5)
WBC: 6.1 10*3/uL (ref 4.0–10.5)

## 2017-01-27 LAB — COMPREHENSIVE METABOLIC PANEL
ALBUMIN: 3 g/dL — AB (ref 3.5–5.0)
ALT: 14 U/L (ref 14–54)
ANION GAP: 6 (ref 5–15)
AST: 22 U/L (ref 15–41)
Alkaline Phosphatase: 55 U/L (ref 38–126)
BILIRUBIN TOTAL: 2 mg/dL — AB (ref 0.3–1.2)
BUN: 8 mg/dL (ref 6–20)
CALCIUM: 8.6 mg/dL — AB (ref 8.9–10.3)
CO2: 24 mmol/L (ref 22–32)
Chloride: 109 mmol/L (ref 101–111)
Creatinine, Ser: 0.84 mg/dL (ref 0.44–1.00)
GFR calc Af Amer: >60 mL/min (ref >60)
GFR calc non Af Amer: >60 mL/min (ref >60)
GLUCOSE: 79 mg/dL (ref 65–99)
POTASSIUM: 3.2 mmol/L — AB (ref 3.5–5.1)
SODIUM: 139 mmol/L (ref 135–145)
TOTAL PROTEIN: 6.2 g/dL — AB (ref 6.5–8.1)

## 2017-01-27 LAB — PREPARE RBC (CROSSMATCH)

## 2017-01-27 LAB — GLUCOSE, CAPILLARY: Glucose-Capillary: 78 mg/dL (ref 65–99)

## 2017-01-27 LAB — HIV ANTIBODY (ROUTINE TESTING W REFLEX): HIV SCREEN 4TH GENERATION: NONREACTIVE

## 2017-01-27 SURGERY — IMAGING PROCEDURE, GI TRACT, INTRALUMINAL, VIA CAPSULE

## 2017-01-27 MED ORDER — SODIUM CHLORIDE 0.9 % IV SOLN
Freq: Once | INTRAVENOUS | Status: AC
  Administered 2017-01-27: 09:00:00 via INTRAVENOUS

## 2017-01-27 MED ORDER — POTASSIUM CHLORIDE CRYS ER 20 MEQ PO TBCR
40.0000 meq | EXTENDED_RELEASE_TABLET | Freq: Once | ORAL | Status: AC
  Administered 2017-01-27: 40 meq via ORAL
  Filled 2017-01-27: qty 2

## 2017-01-27 NOTE — Progress Notes (Signed)
Called [facility] to get estimated time of arrival and nurse is to arrive at 1900 today.

## 2017-01-27 NOTE — Progress Notes (Signed)
Brandi [name] HWE:[id number] DOB: [birth year] DOA: 01/25/2017 PCP: Barney Drain, MD   Subjective: This 61 year old lady was admitted with a approximately 3 month history of PICA and was found to have severe anemia with a hemoglobin of 3.5. This was microcytic and iron studies are consistent with iron deficiency. She has now had a total of 4 units blood transfusion and has been seen by gastroenterology and had EGD and colonoscopy yesterday. Unfortunately this source of the GI bleed is unclear still. EGD was unremarkable and colonoscopy showed a few polyps but no evidence of malignancy. She is now in the process of having a video capsule study. She says she feels much improved. I've been called by the nurse yesterday for a sinus bradycardia. We will continue to observe this as she is hemodynamic stable and asymptomatic from the bradycardia.           Physical Exam: Blood pressure 135/62, pulse (!) 37, temperature 98.5 F (36.9 C), temperature source Oral, resp. rate 16, height 5\' 4"  (1.626 m), weight 127.6 kg (281 lb 4.9 oz), SpO2 99 %. She is hemodynamically stable. She looks pale still. Heart sounds are present without murmurs. Lung fields are clear. Abdomen is soft and nontender. I cannot appreciate any specific masses. There is no hepatosplenomegaly. She is alert and orientated and there are no obvious focal neurological signs.   Investigations:  Recent Results (from the past 240 hour(s))  MRSA PCR Screening     Status: None   Collection Time: 01/25/17 11:30 AM  Result Value Ref Range Status   MRSA by PCR NEGATIVE NEGATIVE Final    Comment:        The GeneXpert MRSA Assay (FDA approved for NASAL specimens only), is one component of a comprehensive MRSA colonization surveillance program. It is not intended to diagnose MRSA infection nor to guide or monitor treatment for MRSA infections.      Basic Metabolic Panel:  Recent Labs  01/25/17 0918 01/27/17 0435  NA 138  139  K 3.6 3.2*  CL 107 109  CO2 20* 24  GLUCOSE 126* 79  BUN 17 8  CREATININE 1.29* 0.84  CALCIUM 8.5* 8.6*  MG 1.7  --    Liver Function Tests:  Recent Labs  01/25/17 0918 01/27/17 0435  AST 27 22  ALT 16 14  ALKPHOS 61 55  BILITOT <0.1* 2.0*  PROT 6.7 6.2*  ALBUMIN 3.2* 3.0*     CBC:  Recent Labs  01/26/17 0809 01/27/17 0435  WBC 7.5 6.1  HGB 8.2* 7.0*  HCT 27.8* 24.1*  MCV 69.8* 69.5*  PLT 350 289    Dg Chest 2 View  Result Date: 01/25/2017 CLINICAL DATA:  Chest pain, ankle and feet swelling for 1 week, wheezing EXAM: CHEST  2 VIEW COMPARISON:  06/23/2012 FINDINGS: Cardiomegaly is noted. No infiltrate or pulmonary edema. Suboptimal exam due to patient's large body habitus. Degenerative changes mid and lower thoracic spine. IMPRESSION: Cardiomegaly. No infiltrate or pulmonary edema. Degenerative changes mid and lower thoracic spine. Electronically Signed   By: Lahoma Crocker M.D.   On: 01/25/2017 10:25      Medications: I have reviewed the patient's current medications.  Impression: 1. Microcytic symptomatic anemia, status post 4 units blood transfusion. 2. Probable slow GI bleed.     Plan: 1. Continue to monitor closely with CBC and hemodynamics. There is no evidence of acute GI bleed at the present time. I will give her further 2 units of blood transfusion. 2. Await  further results.   Consultants:  Gastroenterology.   Procedures:  None.   Antibiotics:  None.                   Code Status: Full code.  Family Communication: I discussed the plan with the patient at the bedside.   Disposition Plan: Home when medically stable.  Time spent: 15 minutes.   LOS: 1 day   [place] C   01/27/2017, 8:51 AM

## 2017-01-27 NOTE — Progress Notes (Signed)
Video capsule swallow this morning without difficulty. Patient voices no complaints. Downward drift in hemoglobin is 7.0 this morning. No overt bleeding. Bradycardic.  Essentially negative EGD and colonoscopy as far as cause of profound iron deficiency anemia concern.  Vital signs in last 24 hours: Temp:  [97.8 F (36.6 C)-98.5 F (36.9 C)] 98.5 F (36.9 C) (04/15 0736) Pulse Rate:  [37-97] 37 (04/15 0800) Resp:  [12-22] 16 (04/15 0800) BP: (92-142)/(61-76) 135/62 (04/15 0800) SpO2:  [98 %-100 %] 99 % (04/15 0736) Weight:  [281 lb 4.9 oz (127.6 kg)] 281 lb 4.9 oz (127.6 kg) (04/15 0500) Last BM Date: 01/26/17 General:   Alert,  Well-developed, well-nourished, pleasant and cooperative in NAD Intake/Output from previous day: 04/14 0701 - 04/15 0700 In: 443 [P.O.:240; I.V.:203] Out: -  Intake/Output this shift: No intake/output data recorded.  Lab Results:  Recent Labs  01/25/17 1913 01/26/17 0809 01/27/17 0435  WBC 5.9 7.5 6.1  HGB 5.9* 8.2* 7.0*  HCT 21.9* 27.8* 24.1*  PLT 351 350 289   BMET  Recent Labs  01/25/17 0918 01/27/17 0435  NA 138 139  K 3.6 3.2*  CL 107 109  CO2 20* 24  GLUCOSE 126* 79  BUN 17 8  CREATININE 1.29* 0.84  CALCIUM 8.5* 8.6*   LFT  Recent Labs  01/27/17 0435  PROT 6.2*  ALBUMIN 3.0*  AST 22  ALT 14  ALKPHOS 55  BILITOT 2.0*    Impression:   Profound microcytic anemia. Low volume hematochezia. Negative EGD and colonoscopy.  Clinically, no overt bleeding since admission.  Imaging of small bowel pending.  Recommendations:  Continue close observation. Small bowel images to be interpreted tomorrow when they become available.Marland Kitchen

## 2017-01-27 NOTE — Progress Notes (Signed)
Patients IV site is leaking at completion of 1st unit of PRBC. Nursing staff attempted IV with no success. Patient has limited venous access and still needs another unit of blood to be administered.  Paged Dr. Anastasio Champion who then ordered PICC line.

## 2017-01-28 ENCOUNTER — Inpatient Hospital Stay (HOSPITAL_COMMUNITY): Payer: Medicare HMO

## 2017-01-28 DIAGNOSIS — D649 Anemia, unspecified: Secondary | ICD-10-CM

## 2017-01-28 DIAGNOSIS — K6389 Other specified diseases of intestine: Secondary | ICD-10-CM

## 2017-01-28 LAB — TYPE AND SCREEN
ABO/RH(D): A POS
ANTIBODY SCREEN: NEGATIVE
UNIT DIVISION: 0
UNIT DIVISION: 0
Unit division: 0
Unit division: 0
Unit division: 0
Unit division: 0

## 2017-01-28 LAB — BPAM RBC
BLOOD PRODUCT EXPIRATION DATE: 201804292359
Blood Product Expiration Date: 201804262359
Blood Product Expiration Date: 201805022359
Blood Product Expiration Date: 201805022359
Blood Product Expiration Date: 201805022359
Blood Product Expiration Date: 201805092359
ISSUE DATE / TIME: 201804131218
ISSUE DATE / TIME: 201804131452
ISSUE DATE / TIME: 201804132149
ISSUE DATE / TIME: 201804140128
ISSUE DATE / TIME: 201804150924
ISSUE DATE / TIME: 201804152057
UNIT TYPE AND RH: 600
UNIT TYPE AND RH: 6200
Unit Type and Rh: 6200
Unit Type and Rh: 6200
Unit Type and Rh: 6200
Unit Type and Rh: 6200

## 2017-01-28 LAB — BASIC METABOLIC PANEL
ANION GAP: 8 (ref 5–15)
BUN: 7 mg/dL (ref 6–20)
CO2: 25 mmol/L (ref 22–32)
Calcium: 8.6 mg/dL — ABNORMAL LOW (ref 8.9–10.3)
Chloride: 107 mmol/L (ref 101–111)
Creatinine, Ser: 0.95 mg/dL (ref 0.44–1.00)
GFR calc Af Amer: >60 mL/min (ref >60)
GFR calc non Af Amer: >60 mL/min (ref >60)
Glucose, Bld: 77 mg/dL (ref 65–99)
POTASSIUM: 3.3 mmol/L — AB (ref 3.5–5.1)
SODIUM: 140 mmol/L (ref 135–145)

## 2017-01-28 LAB — CBC
HEMATOCRIT: 30.1 % — AB (ref 36.0–46.0)
HEMOGLOBIN: 9.3 g/dL — AB (ref 12.0–15.0)
MCH: 22.6 pg — ABNORMAL LOW (ref 26.0–34.0)
MCHC: 30.9 g/dL (ref 30.0–36.0)
MCV: 73.2 fL — ABNORMAL LOW (ref 78.0–100.0)
Platelets: 266 10*3/uL (ref 150–400)
RBC: 4.11 MIL/uL (ref 3.87–5.11)
RDW: 27.7 % — AB (ref 11.5–15.5)
WBC: 5.5 10*3/uL (ref 4.0–10.5)

## 2017-01-28 LAB — RETICULOCYTES
RBC.: 4.13 MIL/uL (ref 3.87–5.11)
RETIC COUNT ABSOLUTE: 107.4 10*3/uL (ref 19.0–186.0)
Retic Ct Pct: 2.6 % (ref 0.4–3.1)

## 2017-01-28 MED ORDER — BARIUM SULFATE 0.1 % PO SUSP
ORAL | Status: AC
  Filled 2017-01-28: qty 3

## 2017-01-28 MED ORDER — IOPAMIDOL (ISOVUE-300) INJECTION 61%
100.0000 mL | Freq: Once | INTRAVENOUS | Status: AC | PRN
  Administered 2017-01-28: 100 mL via INTRAVENOUS

## 2017-01-28 MED ORDER — POTASSIUM CHLORIDE CRYS ER 20 MEQ PO TBCR
20.0000 meq | EXTENDED_RELEASE_TABLET | Freq: Every day | ORAL | Status: AC
  Administered 2017-01-28 – 2017-01-29 (×2): 20 meq via ORAL
  Filled 2017-01-28 (×2): qty 1

## 2017-01-28 NOTE — Progress Notes (Signed)
Briefly spoke with patient. No overt GI bleeding. No abdominal pain. Informed patient that capsule study would be reviewed this morning. She had no concerns. Hgb 9.3, up from 7 after 2 units PRBCs. She has received a total of 6 this admission.   Annitta Needs, ANP-BC [facility]

## 2017-01-28 NOTE — Progress Notes (Signed)
No overt source of bleeding found EGD and colonoscopy performed today with polypectomy status post 4 units packed red blood cells transfused will order B-12 and folate levels for completeness sake as well as reticulocytes patient profoundly iron deficient will wait capsule study to supplement with iron Brandi [name] PFY:[id number] DOB: [birth year] DOA: 01/25/2017 PCP: Brandi Drain, MD   Physical Exam: Blood pressure (!) 119/52, pulse 68, temperature 98.2 F (36.8 C), temperature source Oral, resp. rate 17, height 5\' 4"  (1.626 m), weight 127.6 kg (281 lb 4.9 oz), SpO2 99 %. Lungs clear to A&P no rales wheeze rhonchi heart regular rhythm no murmurs gallops heaves thrills rubs abdomen soft nontender bowel sounds normoactive   Investigations:  Recent Results (from the past 240 hour(s))  MRSA PCR Screening     Status: None   Collection Time: 01/25/17 11:30 AM  Result Value Ref Range Status   MRSA by PCR NEGATIVE NEGATIVE Final    Comment:        The GeneXpert MRSA Assay (FDA approved for NASAL specimens only), is one component of a comprehensive MRSA colonization surveillance program. It is not intended to diagnose MRSA infection nor to guide or monitor treatment for MRSA infections.      Basic Metabolic Panel:  Recent Labs  01/27/17 0435 01/28/17 0515  NA 139 140  Brandi 3.2* 3.3*  CL 109 107  CO2 24 25  GLUCOSE 79 77  BUN 8 7  CREATININE 0.84 0.95  CALCIUM 8.6* 8.6*   Liver Function Tests:  Recent Labs  01/27/17 0435  AST 22  ALT 14  ALKPHOS 55  BILITOT 2.0*  PROT 6.2*  ALBUMIN 3.0*     CBC:  Recent Labs  01/27/17 0435 01/28/17 0515  WBC 6.1 5.5  HGB 7.0* 9.3*  HCT 24.1* 30.1*  MCV 69.5* 73.2*  PLT 289 266    No results found.    Medications:   Impression: Principal Problem:   Symptomatic anemia Active Problems:   Obesity   Microcytic anemia   DM type 2 (diabetes mellitus, type 2) (HCC)     Plan:Brandi [name] 20 mEq by mouth daily 2 days.  B-12 and folate acid level ordered reticulocytes ordered. We will await iron supplementation until studies complete   Consultants: Gastroenterology   Procedures   Antibiotics:          Time spent: 30 minutes   LOS: 2 days   Brandi [name] M   01/28/2017, 12:26 PM

## 2017-01-28 NOTE — Care Management Note (Signed)
Case Management Note  Patient Details  Name: Brandi [name] MRN: [id number] Date of Birth: 06/11/1956  Subjective/Objective: Adm with symptomatic anemia. From home with son, who is present at bedside and very attentive. Patient walks with cane, no HH PTA. She has a PCP, (Dr. Cindie Laroche), has transportation to appointments (friends or [facility]) and reports no issues affording medications.                    Action/Plan: Anticipate DC home with self care. Will follow.    Expected Discharge Date:  01/27/17               Expected Discharge Plan:  Home/Self Care (vs home health)  In-House Referral:     Discharge planning Services  CM Consult  Post Acute Care Choice:    Choice offered to:     DME Arranged:    DME Agency:     HH Arranged:    HH Agency:     Status of Service:  In process, will continue to follow  If discussed at Long Length of Stay Meetings, dates discussed:    Additional Comments:  Javyn Havlin, Chauncey Reading, RN 01/28/2017, 2:25 PM

## 2017-01-28 NOTE — Anesthesia Postprocedure Evaluation (Signed)
Anesthesia Post Note  Patient: Brandi [name]  Procedure(s) Performed: Procedure(s) (LRB): COLONOSCOPY WITH PROPOFOL (N/A) ESOPHAGOGASTRODUODENOSCOPY (EGD) WITH PROPOFOL (N/A) POLYPECTOMY  Patient location during evaluation: ICU Anesthesia Type: MAC Level of consciousness: awake and alert Pain management: satisfactory to patient Vital Signs Assessment: post-procedure vital signs reviewed and stable Respiratory status: spontaneous breathing Cardiovascular status: stable Anesthetic complications: no     Last Vitals:  Vitals:   01/28/17 0900 01/28/17 1159  BP: (!) 119/52   Pulse: 68   Resp: 17   Temp:  36.8 C    Last Pain:  Vitals:   01/28/17 1159  TempSrc: Oral  PainSc:                  [name]

## 2017-01-28 NOTE — Procedures (Addendum)
Small Bowel [place] Capsule Study Procedure date:  01/27/17  Referring Provider: Dr. Gala Romney   Indication for procedure:  61 year old female with profound anemia on admission with colonoscopy and upper endoscopy unrevealing, now with capsule study to assess for occult GI bleeding.     Findings:   Capsule study was complete to the cecum. A polypoid mass was noted at 02:04:14, which is located approximately three-fourths of the way through the small bowel. Distal to this, wispy fresh blood noted likely emanating from lesion.   First Gastric image:  00:00:33 First Duodenal image: 00:23:12 First Cecal image: 04:09:21 Gastric Passage time: 0h 55m Small Bowel Passage time:  3h 51m  Summary & Recommendations: 61 year old female with impressive IDA and transfusion dependent anemia, with colonoscopy/EGD unrevealing and small bowel capsule study noting bleeding polypoid lesion. Reviewed with attending. CTE ordered for today for localization. Will need surgical consult. Further recommendations to follow.  Brandi [name], ANP-BC [facility]   Pertinent images reviewed. Polypoid lesion with blood trail distally well downstream of the pyloric channel (probably proximal ileum or distal jejunum). Agree with plans to proceed with a CTE

## 2017-01-28 NOTE — Addendum Note (Signed)
Addendum  created 01/28/17 1240 by Vista Deck, CRNA   Sign clinical note

## 2017-01-29 ENCOUNTER — Encounter (HOSPITAL_COMMUNITY): Payer: Self-pay | Admitting: Internal Medicine

## 2017-01-29 LAB — FOLATE RBC
FOLATE, HEMOLYSATE: 394.3 ng/mL
FOLATE, RBC: 1328 ng/mL (ref >498)
Hematocrit: 29.7 % — ABNORMAL LOW (ref 34.0–46.6)

## 2017-01-29 MED ORDER — FERROUS SULFATE 325 (65 FE) MG PO TABS: 325.0000 mg | ORAL_TABLET | Freq: Two times a day (BID) | ORAL | Status: DC

## 2017-01-29 NOTE — Progress Notes (Signed)
Subjective: No abdominal pain. No overt GI bleeding. Tolerating diet.   Objective: Vital signs in last 24 hours: Temp:  [98 F (36.7 C)-99.1 F (37.3 C)] 98.1 F (36.7 C) (04/17 0753) Pulse Rate:  [63-85] 65 (04/17 0600) Resp:  [14-25] 25 (04/17 0600) BP: (119-171)/(52-131) 123/55 (04/17 0600) SpO2:  [71 %-100 %] 96 % (04/17 0600) Weight:  [282 lb 6.6 oz (128.1 kg)] 282 lb 6.6 oz (128.1 kg) (04/17 0500)  BMI: 47.2 Last BM Date: 01/28/17  General:   Alert and oriented, pleasant Head:  Normocephalic and atraumatic. Eyes:  No icterus, sclera clear. Conjuctiva pink.  Mouth:  Without lesions, mucosa pink and moist.  Abdomen:  Bowel sounds present, soft, non-tender, non-distended. Obese Neurologic:  Alert and  oriented x4 Psych:  Alert and cooperative. Normal mood and affect.  Intake/Output from previous day: 04/16 0701 - 04/17 0700 In: 603 [P.O.:600; I.V.:3] Out: -  Intake/Output this shift: No intake/output data recorded.  Lab Results:  Recent Labs  01/26/17 0809 01/27/17 0435 01/28/17 0515  WBC 7.5 6.1 5.5  HGB 8.2* 7.0* 9.3*  HCT 27.8* 24.1* 30.1*  PLT 350 289 266   BMET  Recent Labs  01/27/17 0435 01/28/17 0515  NA 139 140  K 3.2* 3.3*  CL 109 107  CO2 24 25  GLUCOSE 79 77  BUN 8 7  CREATININE 0.84 0.95  CALCIUM 8.6* 8.6*   LFT  Recent Labs  01/27/17 0435  PROT 6.2*  ALBUMIN 3.0*  AST 22  ALT 14  ALKPHOS 55  BILITOT 2.0*     Studies/Results: Ct Entero Abd/pelvis W Contast  Result Date: 01/28/2017 CLINICAL DATA:  Abnormal capsule endoscopy. Polypoid lesion noted 3/4 of the way through small bowel. EXAM: CT ABDOMEN AND PELVIS WITH CONTRAST (ENTEROGRAPHY) TECHNIQUE: Multidetector CT of the abdomen and pelvis during bolus administration of intravenous contrast. Negative oral contrast was given. CONTRAST:  125mL ISOVUE-300 IOPAMIDOL (ISOVUE-300) INJECTION 61% COMPARISON:  None. FINDINGS: Diminished exam detail secondary to patient's body  habitus. Lower chest: There is a small right pleural effusion. Overlying atelectasis noted in the right base. Hepatobiliary: There is an indeterminate and incompletely characterized low-attenuation structure within the right lobe of liver measuring approximately 3.6 cm, image 17 of series 2. Pancreas: No biliary dilatation. Spleen: The spleen is unremarkable. Adrenals/Urinary Tract: Normal appearance of the adrenal glands. The kidneys are unremarkable. Urinary bladder is unremarkable. Stomach/Bowel: The stomach appears normal. The small bowel loops have at normal course and caliber. No obstruction. No abnormal small bowel wall thickening or inflammation. Metallic filling defect within the rectum is presumed to represent endoscopy capsule. Vascular/Lymphatic: Aortic atherosclerosis. No aneurysm. No abdominal or pelvic adenopathy. No inguinal adenopathy. Reproductive: Enlarged partially calcified fibroid uterus noted. Other: Along the right pericolic gutter there is an indeterminate low-attenuation structure measuring 3.3 x 2.1 cm, image 52 of series 2. Musculoskeletal: Degenerative disc disease noted within the thoracic or lumbar spine. No aggressive lytic or sclerotic bone lesion. IMPRESSION: 1. Diminished exam detail secondary to patient's body habitus. 2. No focal small bowel abnormality identified. 3. There is an indeterminate low-attenuation structure within right lobe of liver. Further investigation with liver protocol MRI or CT is advised. 4. Within the right pericolic gutter there is a low-attenuation structure this may reflect a chronic abnormality. Without previous imaging for comparison stability of this structure cannot be confirmed. Suggest followup imaging in 3-6 months to a assess for any temporal change in this abnormality. Electronically Signed   By: Lovena Le  Clovis Riley M.D.   On: 01/28/2017 17:12    Assessment: 61 year old female admitted with impressive IDA (Hgb in 3 range on admission), with  colonoscopy/EGD unrevealing to explain profound IDA (colonoscopy with two 3-4 mm polyps s/p polypectomy). Capsule study reviewed on 4/16 noting polypoid lesion well downstream and possible in proximal ileum or distal jejunum, with bloody trail distal to lesion. CTE yesterday completed with diminished exam secondary to body habitus, no focal small bowel abnormality noted, and an indeterminate low-attenuation structure within the right lobe of the liver. Recommending liver protocol MRI or CT. Also noted a low-attenuation structure in right pericolic gutter that could represent a chronic abnormality, with follow-up recommended.   Clinically, she is without overt bleeding and asymptomatic. Interesting situation, and will need to discuss further with Dr. Oneida Alar. May need liver protocol CT while inpatient. I am unsure if body habitus would allow an MRI here, due to central pattern obesity.   Plan: Will likely need additional imaging To discuss further with attending Continue carb modified diet Recheck CBC, HFP in am   Brandi [name], ANP-BC [facility]    LOS: 3 days    01/29/2017, 8:08 AM

## 2017-01-29 NOTE — Progress Notes (Signed)
Appreciate GI note and need for hepatic MRI protocol will begin oral iron supplementation twice a day today we will monitor be met in a.m. Brandi [name] [id number] DOB: [birth year] DOA: 01/25/2017 PCP: Barney Drain, MD   Physical Exam: Blood pressure (!) 135/54, pulse 64, temperature 98.8 F (37.1 C), temperature source Oral, resp. rate (!) 7, height '5\' 4"'$  (1.626 m), weight 128.1 kg (282 lb 6.6 oz), SpO2 98 %. Lungs clear to A&P no rales wheeze rhonchi heart regular rhythm no murmurs goes rubs abdomen soft nontender bowel sounds normoactive   Investigations:  Recent Results (from the past 240 hour(s))  MRSA PCR Screening     Status: None   Collection Time: 01/25/17 11:30 AM  Result Value Ref Range Status   MRSA by PCR NEGATIVE NEGATIVE Final    Comment:        The GeneXpert MRSA Assay (FDA approved for NASAL specimens only), is one component of a comprehensive MRSA colonization surveillance program. It is not intended to diagnose MRSA infection nor to guide or monitor treatment for MRSA infections.      Basic Metabolic Panel:  Recent Labs  01/27/17 0435 01/28/17 0515  NA 139 140  K 3.2* 3.3*  CL 109 107  CO2 24 25  GLUCOSE 79 77  BUN 8 7  CREATININE 0.84 0.95  CALCIUM 8.6* 8.6*   Liver Function Tests:  Recent Labs  01/27/17 0435  AST 22  ALT 14  ALKPHOS 55  BILITOT 2.0*  PROT 6.2*  ALBUMIN 3.0*     CBC:  Recent Labs  01/27/17 0435 01/28/17 0515  WBC 6.1 5.5  HGB 7.0* 9.3*  HCT 24.1* 30.1*  MCV 69.5* 73.2*  PLT 289 266    Ct Entero Abd/pelvis W Contast  Result Date: 01/28/2017 CLINICAL DATA:  Abnormal capsule endoscopy. Polypoid lesion noted 3/4 of the way through small bowel. EXAM: CT ABDOMEN AND PELVIS WITH CONTRAST (ENTEROGRAPHY) TECHNIQUE: Multidetector CT of the abdomen and pelvis during bolus administration of intravenous contrast. Negative oral contrast was given. CONTRAST:  148m ISOVUE-300 IOPAMIDOL (ISOVUE-300) INJECTION 61%  COMPARISON:  None. FINDINGS: Diminished exam detail secondary to patient's body habitus. Lower chest: There is a small right pleural effusion. Overlying atelectasis noted in the right base. Hepatobiliary: There is an indeterminate and incompletely characterized low-attenuation structure within the right lobe of liver measuring approximately 3.6 cm, image 17 of series 2. Pancreas: No biliary dilatation. Spleen: The spleen is unremarkable. Adrenals/Urinary Tract: Normal appearance of the adrenal glands. The kidneys are unremarkable. Urinary bladder is unremarkable. Stomach/Bowel: The stomach appears normal. The small bowel loops have at normal course and caliber. No obstruction. No abnormal small bowel wall thickening or inflammation. Metallic filling defect within the rectum is presumed to represent endoscopy capsule. Vascular/Lymphatic: Aortic atherosclerosis. No aneurysm. No abdominal or pelvic adenopathy. No inguinal adenopathy. Reproductive: Enlarged partially calcified fibroid uterus noted. Other: Along the right pericolic gutter there is an indeterminate low-attenuation structure measuring 3.3 x 2.1 cm, image 52 of series 2. Musculoskeletal: Degenerative disc disease noted within the thoracic or lumbar spine. No aggressive lytic or sclerotic bone lesion. IMPRESSION: 1. Diminished exam detail secondary to patient's body habitus. 2. No focal small bowel abnormality identified. 3. There is an indeterminate low-attenuation structure within right lobe of liver. Further investigation with liver protocol MRI or CT is advised. 4. Within the right pericolic gutter there is a low-attenuation structure this may reflect a chronic abnormality. Without previous imaging for comparison stability of this structure cannot  be confirmed. Suggest followup imaging in 3-6 months to a assess for any temporal change in this abnormality. Electronically Signed   By: Kerby Moors M.D.   On: 01/28/2017 17:12      Medications:    Impression:  Principal Problem:   Symptomatic anemia Active Problems:   Obesity   Microcytic anemia   DM type 2 (diabetes mellitus, type 2) (HCC)   Small bowel mass     Plan: Be met in a.m. hepatic protocol MRI to be performed. 4 begin oral iron supplementation twice a day today  Consultants: Gastroenterology   Procedures   Antibiotics:           Time spent: 30 minutes   LOS: 3 days   Kyaira Trantham M   01/29/2017, 2:00 PM

## 2017-01-30 LAB — HEPATIC FUNCTION PANEL
ALK PHOS: 52 U/L (ref 38–126)
ALT: 13 U/L — AB (ref 14–54)
AST: 18 U/L (ref 15–41)
Albumin: 2.7 g/dL — ABNORMAL LOW (ref 3.5–5.0)
BILIRUBIN DIRECT: 0.5 mg/dL (ref 0.1–0.5)
BILIRUBIN INDIRECT: 1.3 mg/dL — AB (ref 0.3–0.9)
BILIRUBIN TOTAL: 1.8 mg/dL — AB (ref 0.3–1.2)
Total Protein: 5.9 g/dL — ABNORMAL LOW (ref 6.5–8.1)

## 2017-01-30 LAB — CBC
HEMATOCRIT: 30.8 % — AB (ref 36.0–46.0)
HEMOGLOBIN: 9.3 g/dL — AB (ref 12.0–15.0)
MCH: 22.7 pg — AB (ref 26.0–34.0)
MCHC: 30.2 g/dL (ref 30.0–36.0)
MCV: 75.3 fL — AB (ref 78.0–100.0)
Platelets: 273 10*3/uL (ref 150–400)
RBC: 4.09 MIL/uL (ref 3.87–5.11)
RDW: 30.5 % — ABNORMAL HIGH (ref 11.5–15.5)
WBC: 6.5 10*3/uL (ref 4.0–10.5)

## 2017-01-30 LAB — BASIC METABOLIC PANEL
Anion gap: 6 (ref 5–15)
BUN: 7 mg/dL (ref 6–20)
CALCIUM: 8.4 mg/dL — AB (ref 8.9–10.3)
CO2: 26 mmol/L (ref 22–32)
CREATININE: 0.83 mg/dL (ref 0.44–1.00)
Chloride: 108 mmol/L (ref 101–111)
GFR calc Af Amer: >60 mL/min (ref >60)
GLUCOSE: 70 mg/dL (ref 65–99)
Potassium: 3.3 mmol/L — ABNORMAL LOW (ref 3.5–5.1)
SODIUM: 140 mmol/L (ref 135–145)

## 2017-01-30 MED ORDER — SODIUM CHLORIDE 0.9 % IV SOLN
Freq: Once | INTRAVENOUS | Status: AC
  Administered 2017-02-01: 11:00:00 via INTRAVENOUS

## 2017-01-30 MED ORDER — POTASSIUM CHLORIDE CRYS ER 20 MEQ PO TBCR
20.0000 meq | EXTENDED_RELEASE_TABLET | Freq: Every day | ORAL | Status: DC
  Administered 2017-01-30: 20 meq via ORAL
  Filled 2017-01-30: qty 1

## 2017-01-30 MED ORDER — POTASSIUM CHLORIDE CRYS ER 20 MEQ PO TBCR
20.0000 meq | EXTENDED_RELEASE_TABLET | Freq: Two times a day (BID) | ORAL | Status: DC
Start: 2017-01-30 — End: 2017-02-01
  Administered 2017-01-30 – 2017-01-31 (×3): 20 meq via ORAL
  Filled 2017-01-30 (×3): qty 1

## 2017-01-30 NOTE — Consult Note (Signed)
Reason for Consult: Small bowel neoplasm Referring Physician: Dr. Lorriane Shire  Brandi [name] is an 61 y.o. female.  HPI: Patient is a 61 year old black female who originally presented to the hospital with severe anemia secondary to a GI bleed. She has a history of diabetes, hypertension, and stroke. She was evaluated by the GI service and underwent both an EGD and colonoscopy. She subsequently underwent a small bowel capsule study which revealed a mass approximate 75% down the small bowel. It is suspected that the patient has had a chronic bleed from the small bowel mass. It could not be accessed via endoscopy. Surgical intervention has been requested. The patient has had received multiple blood transfusions and is currently stable. She denies any abdominal pain.  Past Medical History:  Diagnosis Date  . Anemia   . Diabetes mellitus without complication ([place])   . Hypertension   . Stroke [facility]) 2013    Past Surgical History:  Procedure Laterality Date  . COLONOSCOPY WITH PROPOFOL N/A 01/26/2017   Procedure: COLONOSCOPY WITH PROPOFOL;  Surgeon: Daneil Dolin, MD;  Location: [facility];  Service: Endoscopy;  Laterality: N/A;  . cyst removed from right breast    . ESOPHAGOGASTRODUODENOSCOPY (EGD) WITH PROPOFOL N/A 01/26/2017   Procedure: ESOPHAGOGASTRODUODENOSCOPY (EGD) WITH PROPOFOL;  Surgeon: Daneil Dolin, MD;  Location: [facility];  Service: Endoscopy;  Laterality: N/A;  . KNEE SURGERY  right knee  . POLYPECTOMY  01/26/2017   Procedure: POLYPECTOMY;  Surgeon: Daneil Dolin, MD;  Location: [facility];  Service: Endoscopy;;  sigmoid and rectal    Family History  Problem Relation Age of Onset  . Heart attack Father   . Ulcers Mother     stomach    Social History:  reports that she quit smoking about 5 years ago. Her smoking use included Cigarettes. She has a 25.00 pack-year smoking history. She has never used smokeless tobacco. She reports that she does not drink alcohol or  use drugs.  Allergies:  Allergies  Allergen Reactions  . Codeine     Unknown     Medications:  Prior to Admission:  Prescriptions Prior to Admission  Medication Sig Dispense Refill Last Dose  . ALPRAZolam (XANAX) 0.5 MG tablet Take 0.5 mg by mouth daily as needed for anxiety.   01/24/2017 at Unknown time  . aspirin 325 MG tablet Take 325 mg by mouth daily.   01/24/2017 at Unknown time  . lisinopril-hydrochlorothiazide (PRINZIDE,ZESTORETIC) 20-12.5 MG per tablet Take 1 tablet by mouth daily.   01/24/2017 at Unknown time  . metFORMIN (GLUCOPHAGE) 500 MG tablet Take 1 tablet by mouth 2 (two) times daily.   01/24/2017 at Unknown time  . metoprolol (LOPRESSOR) 100 MG tablet Take 100 mg by mouth 2 (two) times daily.   01/24/2017 at Unknown time  . Oxycodone HCl 10 MG TABS Take 1 tablet by mouth 2 (two) times daily as needed for pain.   Past Week at Unknown time  . simvastatin (ZOCOR) 20 MG tablet Take 20 mg by mouth daily.   01/24/2017 at Unknown time  . [DISCONTINUED] simvastatin (ZOCOR) 20 MG tablet Take 1 tablet (20 mg total) by mouth daily at 6 PM. 30 tablet 1    Scheduled: . sodium chloride flush  3 mL Intravenous Q12H    Results for orders placed or performed during the hospital encounter of 01/25/17 (from the past 48 hour(s))  Folate RBC     Status: Abnormal   Collection Time: 01/28/17  2:05 PM  Result Value Ref Range   Folate, Hemolysate 394.3 Not Estab. ng/mL   Hematocrit 29.7 (L) 34.0 - 46.6 %   Folate, RBC 1,328 >498 ng/mL    Comment: (NOTE) Performed At: [facility] [place], [place] [id number] Lindon Romp MD QP:[id number]   Reticulocytes     Status: None   Collection Time: 01/28/17  2:05 PM  Result Value Ref Range   Retic Ct Pct 2.6 0.4 - 3.1 %   RBC. 4.13 3.87 - 5.11 MIL/uL   Retic Count, Manual 107.4 19.0 - 186.0 K/uL  CBC     Status: Abnormal   Collection Time: 01/30/17  4:09 AM  Result Value Ref Range   WBC 6.5 4.0 - 10.5 K/uL   RBC  4.09 3.87 - 5.11 MIL/uL   Hemoglobin 9.3 (L) 12.0 - 15.0 g/dL   HCT 30.8 (L) 36.0 - 46.0 %   MCV 75.3 (L) 78.0 - 100.0 fL   MCH 22.7 (L) 26.0 - 34.0 pg   MCHC 30.2 30.0 - 36.0 g/dL   RDW 30.5 (H) 11.5 - 15.5 %   Platelets 273 150 - 400 K/uL    Comment: SPECIMEN CHECKED FOR CLOTS LARGE PLATELETS PRESENT PLATELET COUNT CONFIRMED BY SMEAR   Hepatic function panel     Status: Abnormal   Collection Time: 01/30/17  4:09 AM  Result Value Ref Range   Total Protein 5.9 (L) 6.5 - 8.1 g/dL   Albumin 2.7 (L) 3.5 - 5.0 g/dL   AST 18 15 - 41 U/L   ALT 13 (L) 14 - 54 U/L   Alkaline Phosphatase 52 38 - 126 U/L   Total Bilirubin 1.8 (H) 0.3 - 1.2 mg/dL   Bilirubin, Direct 0.5 0.1 - 0.5 mg/dL   Indirect Bilirubin 1.3 (H) 0.3 - 0.9 mg/dL  Basic metabolic panel     Status: Abnormal   Collection Time: 01/30/17  4:09 AM  Result Value Ref Range   Sodium 140 135 - 145 mmol/L   Potassium 3.3 (L) 3.5 - 5.1 mmol/L   Chloride 108 101 - 111 mmol/L   CO2 26 22 - 32 mmol/L   Glucose, Bld 70 65 - 99 mg/dL   BUN 7 6 - 20 mg/dL   Creatinine, Ser 0.83 0.44 - 1.00 mg/dL   Calcium 8.4 (L) 8.9 - 10.3 mg/dL   GFR calc non Af Amer >60 >60 mL/min   GFR calc Af Amer >60 >60 mL/min    Comment: (NOTE) The eGFR has been calculated using the CKD EPI equation. This calculation has not been validated in all clinical situations. eGFR's persistently <60 mL/min signify possible Chronic Kidney Disease.    Anion gap 6 5 - 15    Ct Entero Abd/pelvis W Contast  Result Date: 01/28/2017 CLINICAL DATA:  Abnormal capsule endoscopy. Polypoid lesion noted 3/4 of the way through small bowel. EXAM: CT ABDOMEN AND PELVIS WITH CONTRAST (ENTEROGRAPHY) TECHNIQUE: Multidetector CT of the abdomen and pelvis during bolus administration of intravenous contrast. Negative oral contrast was given. CONTRAST:  178m ISOVUE-300 IOPAMIDOL (ISOVUE-300) INJECTION 61% COMPARISON:  None. FINDINGS: Diminished exam detail secondary to patient's body  habitus. Lower chest: There is a small right pleural effusion. Overlying atelectasis noted in the right base. Hepatobiliary: There is an indeterminate and incompletely characterized low-attenuation structure within the right lobe of liver measuring approximately 3.6 cm, image 17 of series 2. Pancreas: No biliary dilatation. Spleen: The spleen is unremarkable. Adrenals/Urinary Tract: Normal appearance of the  adrenal glands. The kidneys are unremarkable. Urinary bladder is unremarkable. Stomach/Bowel: The stomach appears normal. The small bowel loops have at normal course and caliber. No obstruction. No abnormal small bowel wall thickening or inflammation. Metallic filling defect within the rectum is presumed to represent endoscopy capsule. Vascular/Lymphatic: Aortic atherosclerosis. No aneurysm. No abdominal or pelvic adenopathy. No inguinal adenopathy. Reproductive: Enlarged partially calcified fibroid uterus noted. Other: Along the right pericolic gutter there is an indeterminate low-attenuation structure measuring 3.3 x 2.1 cm, image 52 of series 2. Musculoskeletal: Degenerative disc disease noted within the thoracic or lumbar spine. No aggressive lytic or sclerotic bone lesion. IMPRESSION: 1. Diminished exam detail secondary to patient's body habitus. 2. No focal small bowel abnormality identified. 3. There is an indeterminate low-attenuation structure within right lobe of liver. Further investigation with liver protocol MRI or CT is advised. 4. Within the right pericolic gutter there is a low-attenuation structure this may reflect a chronic abnormality. Without previous imaging for comparison stability of this structure cannot be confirmed. Suggest followup imaging in 3-6 months to a assess for any temporal change in this abnormality. Electronically Signed   By: Kerby Moors M.D.   On: 01/28/2017 17:12    ROS:  Pertinent items noted in HPI and remainder of comprehensive ROS otherwise negative.  Blood  pressure (!) 134/100, pulse 86, temperature 97.7 F (36.5 C), temperature source Oral, resp. rate 20, height _0  (1.626 m), weight 282 lb 6.6 oz (128.1 kg), SpO2 98 %. Physical Exam: Pleasant black female in no acute distress. Head is normocephalic, atraumatic. Neck is supple without lymphadenopathy. Lungs clear auscultation with equal breath sounds bilaterally. No wheezing or rales noted. Heart examination reveals a regular rate and rhythm without S3, S4, murmurs. The abdomen is rotund, soft, nontender, nondistended. Normal bowel sounds appreciated. I could not appreciate hepatosplenomegaly due to body habitus.  CT scan report reviewed. GI notes reviewed.  Assessment/Plan: Impression: Small bowel neoplasm of undetermined etiology, acute on chronic anemia, morbid obesity, diabetes mellitus Plan: Agree with need for exploration. Patient has been scheduled for exploratory laparotomy, partial small bowel resection on 02/01/2017. I will also evaluate the abnormality in the liver at that time. The risks and benefits of the procedure including bleeding, infection, cardiopulmonary difficulties were fully explained to the patient, who gave informed consent.  Aviva Signs 01/30/2017, 1:29 PM

## 2017-01-30 NOTE — Progress Notes (Signed)
Capsule study revealed polypoid lesion with bloody Trail distal jejunum early ileum of small bowel. His elective surgery to see her to consider excision. Patient appears hemodynamically stable hemoglobin 9.3 potassium 3.3 will continue K Dur 20 mEq by mouth daily Brandi [name] OVF:[id number] DOB: 07-30-1956 DOA: 01/25/2017 PCP: Brandi Drain, MD   Physical Exam: Blood pressure (!) 134/100, pulse 86, temperature 97.7 F (36.5 C), temperature source Oral, resp. rate 20, height _0  (1.626 m), weight 128.1 kg (282 lb 6.6 oz), SpO2 98 %. Lungs clear to A&P no rales wheeze rhonchi heart regular rhythm no S3 or S4 no heaves thrills rubs abdomen obese soft no detectable organomegaly no guarding or rebound no masses no megaly   Investigations:  Recent Results (from the past 240 hour(s))  MRSA PCR Screening     Status: None   Collection Time: 01/25/17 11:30 AM  Result Value Ref Range Status   MRSA by PCR NEGATIVE NEGATIVE Final    Comment:        The GeneXpert MRSA Assay (FDA approved for NASAL specimens only), is one component of a comprehensive MRSA colonization surveillance program. It is not intended to diagnose MRSA infection nor to guide or monitor treatment for MRSA infections.      Basic Metabolic Panel:  Recent Labs  01/28/17 0515 01/30/17 0409  NA 140 140  K 3.3* 3.3*  CL 107 108  CO2 25 26  GLUCOSE 77 70  BUN 7 7  CREATININE 0.95 0.83  CALCIUM 8.6* 8.4*   Liver Function Tests:  Recent Labs  01/30/17 0409  AST 18  ALT 13*  ALKPHOS 52  BILITOT 1.8*  PROT 5.9*  ALBUMIN 2.7*     CBC:  Recent Labs  01/28/17 0515 01/28/17 1405 01/30/17 0409  WBC 5.5  --  6.5  HGB 9.3*  --  9.3*  HCT 30.1* 29.7* 30.8*  MCV 73.2*  --  75.3*  PLT 266  --  273    Ct Entero Abd/pelvis W Contast  Result Date: 01/28/2017 CLINICAL DATA:  Abnormal capsule endoscopy. Polypoid lesion noted 3/4 of the way through small bowel. EXAM: CT ABDOMEN AND PELVIS WITH CONTRAST  (ENTEROGRAPHY) TECHNIQUE: Multidetector CT of the abdomen and pelvis during bolus administration of intravenous contrast. Negative oral contrast was given. CONTRAST:  181m ISOVUE-300 IOPAMIDOL (ISOVUE-300) INJECTION 61% COMPARISON:  None. FINDINGS: Diminished exam detail secondary to patient's body habitus. Lower chest: There is a small right pleural effusion. Overlying atelectasis noted in the right base. Hepatobiliary: There is an indeterminate and incompletely characterized low-attenuation structure within the right lobe of liver measuring approximately 3.6 cm, image 17 of series 2. Pancreas: No biliary dilatation. Spleen: The spleen is unremarkable. Adrenals/Urinary Tract: Normal appearance of the adrenal glands. The kidneys are unremarkable. Urinary bladder is unremarkable. Stomach/Bowel: The stomach appears normal. The small bowel loops have at normal course and caliber. No obstruction. No abnormal small bowel wall thickening or inflammation. Metallic filling defect within the rectum is presumed to represent endoscopy capsule. Vascular/Lymphatic: Aortic atherosclerosis. No aneurysm. No abdominal or pelvic adenopathy. No inguinal adenopathy. Reproductive: Enlarged partially calcified fibroid uterus noted. Other: Along the right pericolic gutter there is an indeterminate low-attenuation structure measuring 3.3 x 2.1 cm, image 52 of series 2. Musculoskeletal: Degenerative disc disease noted within the thoracic or lumbar spine. No aggressive lytic or sclerotic bone lesion. IMPRESSION: 1. Diminished exam detail secondary to patient's body habitus. 2. No focal small bowel abnormality identified. 3. There is an indeterminate low-attenuation structure  within right lobe of liver. Further investigation with liver protocol MRI or CT is advised. 4. Within the right pericolic gutter there is a low-attenuation structure this may reflect a chronic abnormality. Without previous imaging for comparison stability of this  structure cannot be confirmed. Suggest followup imaging in 3-6 months to a assess for any temporal change in this abnormality. Electronically Signed   By: Kerby Moors M.D.   On: 01/28/2017 17:12      Medications:   Impression: Principal Problem:   Symptomatic anemia Active Problems:   Obesity   Microcytic anemia   DM type 2 (diabetes mellitus, type 2) (HCC)   Small bowel mass     Plan: K Dur 20 mg by mouth daily and 3 runs of IV KCl 10 mEq each today check be met and CBC in a.m. await surgical consultation  Consultants: Gastroenterology and surgery   Procedures capsule study   Antibiotics:            Time spent: 30 minutes   LOS: 4 days   [name]   01/30/2017, 1:29 PM

## 2017-01-30 NOTE — Progress Notes (Signed)
Subjective:  Patient without complaints. No overt GI bleeding.   Objective: Vital signs in last 24 hours: Temp:  [97.6 F (36.4 C)-99.4 F (37.4 C)] 98.9 F (37.2 C) (04/18 0400) Pulse Rate:  [64-77] 67 (04/18 0700) Resp:  [5-35] 22 (04/18 0700) BP: (96-173)/(40-147) 149/66 (04/18 0700) SpO2:  [91 %-100 %] 96 % (04/18 0700) Weight:  [282 lb 6.6 oz (128.1 kg)] 282 lb 6.6 oz (128.1 kg) (04/18 0500) Last BM Date: 01/28/17 General:   Alert,  Well-developed, well-nourished, pleasant and cooperative in NAD Head:  Normocephalic and atraumatic. Eyes:  Sclera clear, no icterus.  Abdomen:  Soft, nontender and nondistended.  Normal bowel sounds, without guarding, and without rebound.   Extremities:  Without clubbing, deformity or edema. Neurologic:  Alert and  oriented x4;  grossly normal neurologically. Skin:  Intact without significant lesions or rashes. Psych:  Alert and cooperative. Normal mood and affect.  Intake/Output from previous day: 04/17 0701 - 04/18 0700 In: 1440 [P.O.:1440] Out: 300 [Urine:300] Intake/Output this shift: No intake/output data recorded.  Lab Results: CBC  Recent Labs  01/28/17 0515 01/28/17 1405 01/30/17 0409  WBC 5.5  --  6.5  HGB 9.3*  --  9.3*  HCT 30.1* 29.7* 30.8*  MCV 73.2*  --  75.3*  PLT 266  --  273   BMET  Recent Labs  01/28/17 0515 01/30/17 0409  NA 140 140  K 3.3* 3.3*  CL 107 108  CO2 25 26  GLUCOSE 77 70  BUN 7 7  CREATININE 0.95 0.83  CALCIUM 8.6* 8.4*   LFTs  Recent Labs  01/30/17 0409  BILITOT 1.8*  BILIDIR 0.5  IBILI 1.3*  ALKPHOS 52  AST 18  ALT 13*  PROT 5.9*  ALBUMIN 2.7*   No results for input(s): LIPASE in the last 72 hours. PT/INR No results for input(s): LABPROT, INR in the last 72 hours.    Imaging Studies: Dg Chest 2 View  Result Date: 01/25/2017 CLINICAL DATA:  Chest pain, ankle and feet swelling for 1 week, wheezing EXAM: CHEST  2 VIEW COMPARISON:  06/23/2012 FINDINGS: Cardiomegaly is  noted. No infiltrate or pulmonary edema. Suboptimal exam due to patient's large body habitus. Degenerative changes mid and lower thoracic spine. IMPRESSION: Cardiomegaly. No infiltrate or pulmonary edema. Degenerative changes mid and lower thoracic spine. Electronically Signed   By: Lahoma Crocker M.D.   On: 01/25/2017 10:25   Ct Entero Abd/pelvis W Contast  Result Date: 01/28/2017 CLINICAL DATA:  Abnormal capsule endoscopy. Polypoid lesion noted 3/4 of the way through small bowel. EXAM: CT ABDOMEN AND PELVIS WITH CONTRAST (ENTEROGRAPHY) TECHNIQUE: Multidetector CT of the abdomen and pelvis during bolus administration of intravenous contrast. Negative oral contrast was given. CONTRAST:  138mL ISOVUE-300 IOPAMIDOL (ISOVUE-300) INJECTION 61% COMPARISON:  None. FINDINGS: Diminished exam detail secondary to patient's body habitus. Lower chest: There is a small right pleural effusion. Overlying atelectasis noted in the right base. Hepatobiliary: There is an indeterminate and incompletely characterized low-attenuation structure within the right lobe of liver measuring approximately 3.6 cm, image 17 of series 2. Pancreas: No biliary dilatation. Spleen: The spleen is unremarkable. Adrenals/Urinary Tract: Normal appearance of the adrenal glands. The kidneys are unremarkable. Urinary bladder is unremarkable. Stomach/Bowel: The stomach appears normal. The small bowel loops have at normal course and caliber. No obstruction. No abnormal small bowel wall thickening or inflammation. Metallic filling defect within the rectum is presumed to represent endoscopy capsule. Vascular/Lymphatic: Aortic atherosclerosis. No aneurysm. No abdominal or pelvic adenopathy.  No inguinal adenopathy. Reproductive: Enlarged partially calcified fibroid uterus noted. Other: Along the right pericolic gutter there is an indeterminate low-attenuation structure measuring 3.3 x 2.1 cm, image 52 of series 2. Musculoskeletal: Degenerative disc disease noted  within the thoracic or lumbar spine. No aggressive lytic or sclerotic bone lesion. IMPRESSION: 1. Diminished exam detail secondary to patient's body habitus. 2. No focal small bowel abnormality identified. 3. There is an indeterminate low-attenuation structure within right lobe of liver. Further investigation with liver protocol MRI or CT is advised. 4. Within the right pericolic gutter there is a low-attenuation structure this may reflect a chronic abnormality. Without previous imaging for comparison stability of this structure cannot be confirmed. Suggest followup imaging in 3-6 months to a assess for any temporal change in this abnormality. Electronically Signed   By: Kerby Moors M.D.   On: 01/28/2017 17:12  [2 weeks]   Assessment:  61 year old female admitted with impressive IDA (Hgb in 3 range on admission), with colonoscopy/EGD unrevealing to explain profound IDA (colonoscopy with two 3-4 mm polyps s/p polypectomy). Capsule study reviewed on 4/16 noting polypoid lesion well downstream and possible in proximal ileum or distal jejunum, with bloody trail distal to lesion. CTE yesterday completed with diminished exam secondary to body habitus, no focal small bowel abnormality noted, and an indeterminate low-attenuation structure within the right lobe of the liver. Recommending liver protocol MRI or CT. Also noted a low-attenuation structure in right pericolic gutter that could represent a chronic abnormality, with follow-up recommended.   Clinically, she is without overt bleeding and asymptomatic.  H/H stable for 48 hours. May need liver protocol CT while inpatient. I am unsure if body habitus would allow an MRI here, due to central pattern obesity.   Awaiting surgical consultation regarding small bowel mass.  Plan: 1. Await surgery recommendations.   Laureen Ochs. Bernarda Caffey [facility] [phone] 4/18/20189:29 AM     LOS: 4 days

## 2017-01-31 ENCOUNTER — Encounter (HOSPITAL_COMMUNITY): Payer: Self-pay | Admitting: Internal Medicine

## 2017-01-31 LAB — CBC
HCT: 31.5 % — ABNORMAL LOW (ref 36.0–46.0)
Hemoglobin: 9.3 g/dL — ABNORMAL LOW (ref 12.0–15.0)
MCH: 22.4 pg — AB (ref 26.0–34.0)
MCHC: 29.5 g/dL — AB (ref 30.0–36.0)
MCV: 75.7 fL — AB (ref 78.0–100.0)
PLATELETS: 253 10*3/uL (ref 150–400)
RBC: 4.16 MIL/uL (ref 3.87–5.11)
WBC: 6.1 10*3/uL (ref 4.0–10.5)

## 2017-01-31 LAB — MAGNESIUM: MAGNESIUM: 1.7 mg/dL (ref 1.7–2.4)

## 2017-01-31 LAB — BASIC METABOLIC PANEL
Anion gap: 7 (ref 5–15)
BUN: 9 mg/dL (ref 6–20)
CALCIUM: 8.5 mg/dL — AB (ref 8.9–10.3)
CO2: 27 mmol/L (ref 22–32)
CREATININE: 0.86 mg/dL (ref 0.44–1.00)
Chloride: 106 mmol/L (ref 101–111)
GFR calc non Af Amer: >60 mL/min (ref >60)
GLUCOSE: 83 mg/dL (ref 65–99)
Potassium: 3.5 mmol/L (ref 3.5–5.1)
Sodium: 140 mmol/L (ref 135–145)

## 2017-01-31 LAB — PHOSPHORUS: Phosphorus: 3.3 mg/dL (ref 2.5–4.6)

## 2017-01-31 LAB — PREPARE RBC (CROSSMATCH)

## 2017-01-31 MED ORDER — CHLORHEXIDINE GLUCONATE CLOTH 2 % EX PADS
6.0000 | MEDICATED_PAD | Freq: Once | CUTANEOUS | Status: AC
  Administered 2017-01-31: 6 via TOPICAL

## 2017-01-31 MED ORDER — CHLORHEXIDINE GLUCONATE CLOTH 2 % EX PADS
6.0000 | MEDICATED_PAD | Freq: Once | CUTANEOUS | Status: AC
  Administered 2017-02-01: 6 via TOPICAL

## 2017-01-31 MED ORDER — ENOXAPARIN SODIUM 40 MG/0.4ML ~~LOC~~ SOLN
40.0000 mg | Freq: Once | SUBCUTANEOUS | Status: AC
  Administered 2017-02-01: 40 mg via SUBCUTANEOUS
  Filled 2017-01-31: qty 0.4

## 2017-01-31 MED ORDER — POTASSIUM CHLORIDE CRYS ER 20 MEQ PO TBCR: 20.0000 meq | EXTENDED_RELEASE_TABLET | Freq: Two times a day (BID) | ORAL | Status: DC

## 2017-01-31 NOTE — Progress Notes (Signed)
4 Days Post-Op  Subjective: Patient comfortable. No abdominal pain noted.  Objective: Vital signs in last 24 hours: Temp:  [97.7 F (36.5 C)-98.5 F (36.9 C)] 98.3 F (36.8 C) (04/19 0532) Pulse Rate:  [79-86] 79 (04/19 0532) Resp:  [11-27] 18 (04/19 0532) BP: (117-174)/(39-101) 145/71 (04/19 0532) SpO2:  [96 %-99 %] 98 % (04/19 0532) Last BM Date: 01/28/17  Intake/Output from previous day: 04/18 0701 - 04/19 0700 In: 1040 [P.O.:1040] Out: 1200 [Urine:1200] Intake/Output this shift: No intake/output data recorded.  General appearance: alert, cooperative and no distress GI: soft, non-tender; bowel sounds normal; no masses,  no organomegaly  Lab Results:   Recent Labs  01/30/17 0409 01/31/17 0604  WBC 6.5 6.1  HGB 9.3* 9.3*  HCT 30.8* 31.5*  PLT 273 253   BMET  Recent Labs  01/30/17 0409 01/31/17 0604  NA 140 140  K 3.3* 3.5  CL 108 106  CO2 26 27  GLUCOSE 70 83  BUN 7 9  CREATININE 0.83 0.86  CALCIUM 8.4* 8.5*   PT/INR No results for input(s): LABPROT, INR in the last 72 hours.  Studies/Results: No results found.  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 Plan: Patient is scheduled for exploratory laparotomy, possible partial small bowel resection tomorrow. The risks and benefits of the procedure including bleeding, infection, cardiopulmonary difficulties, anastomotic leak, and the possibility of a blood transfusion were fully explained to the patient, who gave informed consent.  LOS: 5 days    Aviva Signs 01/31/2017

## 2017-01-31 NOTE — Care Management Note (Signed)
Case Management Note  Patient Details  Name: Brandi [name] MRN: [id number] Date of Birth: 1956-02-15    If discussed at Long Length of Stay Meetings, dates discussed:  01/31/2017  Additional Comments:   Alyah Boehning, Chauncey Reading, RN 01/31/2017, 10:27 AM

## 2017-01-31 NOTE — Progress Notes (Signed)
Bowel resection planned for tomorrow morning potassium 3.5 hemoglobin 9.3 hemodynamically stable clear liquid diet Brandi [name] WYB:[id number] DOB: 1955/11/03 DOA: 01/25/2017 PCP: Brandi Drain, MD   Physical Exam: Blood pressure (!) 145/71, pulse 79, temperature 98.3 F (36.8 C), temperature source Oral, resp. rate 18, height '5\' 4"'$  (1.626 m), weight 128.1 kg (282 lb 6.6 oz), SpO2 98 %. Lungs clear to A&P no rales wheeze rhonchi heart regular rhythm no S3-S4 no heaves or rubs abdomen no detectable organomegaly guarding or rebound   Investigations:  Recent Results (from the past 240 hour(s))  MRSA PCR Screening     Status: None   Collection Time: 01/25/17 11:30 AM  Result Value Ref Range Status   MRSA by PCR NEGATIVE NEGATIVE Final    Comment:        The GeneXpert MRSA Assay (FDA approved for NASAL specimens only), is one component of a comprehensive MRSA colonization surveillance program. It is not intended to diagnose MRSA infection nor to guide or monitor treatment for MRSA infections.      Basic Metabolic Panel:  Recent Labs  01/30/17 0409 01/31/17 0604  NA 140 140  K 3.3* 3.5  CL 108 106  CO2 26 27  GLUCOSE 70 83  BUN 7 9  CREATININE 0.83 0.86  CALCIUM 8.4* 8.5*  MG  --  1.7  PHOS  --  3.3   Liver Function Tests:  Recent Labs  01/30/17 0409  AST 18  ALT 13*  ALKPHOS 52  BILITOT 1.8*  PROT 5.9*  ALBUMIN 2.7*     CBC:  Recent Labs  01/30/17 0409 01/31/17 0604  WBC 6.5 6.1  HGB 9.3* 9.3*  HCT 30.8* 31.5*  MCV 75.3* 75.7*  PLT 273 253    No results found.    Medications:   Impression:  Principal Problem:   Symptomatic anemia Active Problems:   Obesity   Microcytic anemia   DM type 2 (diabetes mellitus, type 2) (HCC)   Small bowel mass     Plan: Clear liquid diet be met and CBC in a.m.  Consultants: Gastroenterology and surgery   Procedures   Antibiotics:            Time spent: 30 minutes   LOS: 5 days    Brandi [name] M   01/31/2017, 11:37 AM

## 2017-02-01 ENCOUNTER — Encounter (HOSPITAL_COMMUNITY)
Admission: EM | Disposition: A | Discharge status: Home / Self Care | Payer: Self-pay | Source: Home / Self Care | Attending: Family Medicine

## 2017-02-01 ENCOUNTER — Inpatient Hospital Stay (HOSPITAL_COMMUNITY): Payer: Medicare HMO | Admitting: Anesthesiology

## 2017-02-01 ENCOUNTER — Encounter (HOSPITAL_COMMUNITY): Payer: Self-pay | Admitting: *Deleted

## 2017-02-01 HISTORY — PX: LAPAROTOMY: SHX154

## 2017-02-01 HISTORY — PX: BOWEL RESECTION: SHX1257

## 2017-02-01 LAB — BASIC METABOLIC PANEL
ANION GAP: 7 (ref 5–15)
BUN: 6 mg/dL (ref 6–20)
CALCIUM: 8.6 mg/dL — AB (ref 8.9–10.3)
CHLORIDE: 107 mmol/L (ref 101–111)
CO2: 27 mmol/L (ref 22–32)
CREATININE: 0.79 mg/dL (ref 0.44–1.00)
GFR calc non Af Amer: >60 mL/min (ref >60)
Glucose, Bld: 75 mg/dL (ref 65–99)
Potassium: 3.8 mmol/L (ref 3.5–5.1)
SODIUM: 141 mmol/L (ref 135–145)

## 2017-02-01 LAB — CBC WITH DIFFERENTIAL/PLATELET
BASOS ABS: 0 10*3/uL (ref 0.0–0.1)
BASOS PCT: 0 %
Eosinophils Absolute: 0.2 10*3/uL (ref 0.0–0.7)
Eosinophils Relative: 3 %
HEMATOCRIT: 31.8 % — AB (ref 36.0–46.0)
HEMOGLOBIN: 9.3 g/dL — AB (ref 12.0–15.0)
LYMPHS PCT: 24 %
Lymphs Abs: 1.3 10*3/uL (ref 0.7–4.0)
MCH: 22.2 pg — ABNORMAL LOW (ref 26.0–34.0)
MCHC: 29.2 g/dL — AB (ref 30.0–36.0)
MCV: 75.9 fL — ABNORMAL LOW (ref 78.0–100.0)
MONOS PCT: 13 %
Monocytes Absolute: 0.7 10*3/uL (ref 0.1–1.0)
Neutro Abs: 3.4 10*3/uL (ref 1.7–7.7)
Neutrophils Relative %: 60 %
Platelets: 242 10*3/uL (ref 150–400)
RBC: 4.19 MIL/uL (ref 3.87–5.11)
WBC: 5.6 10*3/uL (ref 4.0–10.5)

## 2017-02-01 LAB — GLUCOSE, CAPILLARY
GLUCOSE-CAPILLARY: 86 mg/dL (ref 65–99)
GLUCOSE-CAPILLARY: 103 mg/dL — AB (ref 65–99)
Glucose-Capillary: 74 mg/dL (ref 65–99)

## 2017-02-01 LAB — SURGICAL PCR SCREEN
MRSA, PCR: NEGATIVE
STAPHYLOCOCCUS AUREUS: NEGATIVE

## 2017-02-01 SURGERY — LAPAROTOMY, EXPLORATORY
Anesthesia: General | Site: Abdomen

## 2017-02-01 MED ORDER — ONDANSETRON HCL 4 MG/2ML IJ SOLN
4.0000 mg | Freq: Once | INTRAMUSCULAR | Status: AC
  Administered 2017-02-01: 4 mg via INTRAVENOUS

## 2017-02-01 MED ORDER — MIDAZOLAM HCL 2 MG/2ML IJ SOLN
INTRAMUSCULAR | Status: AC
  Filled 2017-02-01: qty 2

## 2017-02-01 MED ORDER — DEXTROSE 50 % IV SOLN
INTRAVENOUS | Status: DC | PRN
  Administered 2017-02-01: 25 g via INTRAVENOUS

## 2017-02-01 MED ORDER — HYDROMORPHONE HCL 1 MG/ML IJ SOLN
1.0000 mg | INTRAMUSCULAR | Status: DC | PRN
  Administered 2017-02-01 – 2017-02-03 (×6): 1 mg via INTRAVENOUS
  Filled 2017-02-01 (×6): qty 1

## 2017-02-01 MED ORDER — FENTANYL CITRATE (PF) 100 MCG/2ML IJ SOLN
INTRAMUSCULAR | Status: DC | PRN
  Administered 2017-02-01: 50 ug via INTRAVENOUS
  Administered 2017-02-01: 25 ug via INTRAVENOUS
  Administered 2017-02-01 (×2): 50 ug via INTRAVENOUS

## 2017-02-01 MED ORDER — PROPOFOL 10 MG/ML IV BOLUS
INTRAVENOUS | Status: AC
  Filled 2017-02-01: qty 40

## 2017-02-01 MED ORDER — ONDANSETRON HCL 4 MG/2ML IJ SOLN
4.0000 mg | Freq: Four times a day (QID) | INTRAMUSCULAR | Status: DC | PRN
  Administered 2017-02-03: 4 mg via INTRAVENOUS
  Filled 2017-02-01: qty 2

## 2017-02-01 MED ORDER — LORAZEPAM 2 MG/ML IJ SOLN
1.0000 mg | INTRAMUSCULAR | Status: DC | PRN
Start: 2017-02-01 — End: 2017-02-04

## 2017-02-01 MED ORDER — ACETAMINOPHEN 325 MG PO TABS: 650.0000 mg | ORAL_TABLET | Freq: Four times a day (QID) | ORAL | Status: DC | PRN

## 2017-02-01 MED ORDER — FENTANYL CITRATE (PF) 250 MCG/5ML IJ SOLN
INTRAMUSCULAR | Status: AC
  Filled 2017-02-01: qty 5

## 2017-02-01 MED ORDER — LIDOCAINE HCL (PF) 1 % IJ SOLN
INTRAMUSCULAR | Status: AC
  Filled 2017-02-01: qty 5

## 2017-02-01 MED ORDER — SIMETHICONE 80 MG PO CHEW: 40.0000 mg | CHEWABLE_TABLET | Freq: Four times a day (QID) | ORAL | Status: DC | PRN

## 2017-02-01 MED ORDER — ROCURONIUM BROMIDE 100 MG/10ML IV SOLN
INTRAVENOUS | Status: DC | PRN
  Administered 2017-02-01: 25 mg via INTRAVENOUS

## 2017-02-01 MED ORDER — DEXTROSE 50 % IV SOLN
25.0000 mL | Freq: Once | INTRAVENOUS | Status: AC
  Administered 2017-02-01: 25 mL via INTRAVENOUS

## 2017-02-01 MED ORDER — LACTATED RINGERS IV SOLN: INTRAVENOUS | Status: DC

## 2017-02-01 MED ORDER — GLYCOPYRROLATE 0.2 MG/ML IJ SOLN
INTRAMUSCULAR | Status: DC | PRN
  Administered 2017-02-01: 0.6 mg via INTRAVENOUS

## 2017-02-01 MED ORDER — ONDANSETRON HCL 4 MG/2ML IJ SOLN
INTRAMUSCULAR | Status: AC
  Filled 2017-02-01: qty 2

## 2017-02-01 MED ORDER — NEOSTIGMINE METHYLSULFATE 10 MG/10ML IV SOLN
INTRAVENOUS | Status: DC | PRN
  Administered 2017-02-01: 4 mg via INTRAVENOUS

## 2017-02-01 MED ORDER — OXYCODONE HCL 5 MG PO TABS
5.0000 mg | ORAL_TABLET | ORAL | Status: DC | PRN
  Administered 2017-02-02 – 2017-02-04 (×5): 10 mg via ORAL
  Filled 2017-02-01 (×5): qty 2

## 2017-02-01 MED ORDER — NEOSTIGMINE METHYLSULFATE 10 MG/10ML IV SOLN
INTRAVENOUS | Status: AC
  Filled 2017-02-01: qty 1

## 2017-02-01 MED ORDER — POVIDONE-IODINE 10 % EX OINT
TOPICAL_OINTMENT | CUTANEOUS | Status: AC
  Filled 2017-02-01: qty 1

## 2017-02-01 MED ORDER — HYDROMORPHONE HCL 1 MG/ML IJ SOLN: 0.2500 mg | INTRAMUSCULAR | Status: DC | PRN

## 2017-02-01 MED ORDER — LACTATED RINGERS IV SOLN
INTRAVENOUS | Status: DC
  Administered 2017-02-01 – 2017-02-03 (×4): via INTRAVENOUS

## 2017-02-01 MED ORDER — PROPOFOL 10 MG/ML IV BOLUS
INTRAVENOUS | Status: AC
  Filled 2017-02-01: qty 20

## 2017-02-01 MED ORDER — DEXTROSE 50 % IV SOLN
INTRAVENOUS | Status: AC
  Filled 2017-02-01: qty 50

## 2017-02-01 MED ORDER — ONDANSETRON 4 MG PO TBDP: 4.0000 mg | ORAL_TABLET | Freq: Four times a day (QID) | ORAL | Status: DC | PRN

## 2017-02-01 MED ORDER — KETOROLAC TROMETHAMINE 30 MG/ML IJ SOLN
30.0000 mg | Freq: Once | INTRAMUSCULAR | Status: AC
  Administered 2017-02-01: 30 mg via INTRAVENOUS
  Filled 2017-02-01: qty 1

## 2017-02-01 MED ORDER — BUPIVACAINE LIPOSOME 1.3 % IJ SUSP
INTRAMUSCULAR | Status: AC
  Filled 2017-02-01: qty 20

## 2017-02-01 MED ORDER — LIDOCAINE HCL (CARDIAC) 20 MG/ML IV SOLN
INTRAVENOUS | Status: DC | PRN
  Administered 2017-02-01: 30 mg via INTRAVENOUS

## 2017-02-01 MED ORDER — MIDAZOLAM HCL 2 MG/2ML IJ SOLN
1.0000 mg | INTRAMUSCULAR | Status: DC
  Administered 2017-02-01: 2 mg via INTRAVENOUS

## 2017-02-01 MED ORDER — ACETAMINOPHEN 650 MG RE SUPP: 650.0000 mg | Freq: Four times a day (QID) | RECTAL | Status: DC | PRN

## 2017-02-01 MED ORDER — DEXTROSE 5 % IV SOLN
INTRAVENOUS | Status: DC | PRN
  Administered 2017-02-01: 11:00:00 via INTRAVENOUS

## 2017-02-01 MED ORDER — BUPIVACAINE LIPOSOME 1.3 % IJ SUSP
INTRAMUSCULAR | Status: DC | PRN
  Administered 2017-02-01: 20 mL

## 2017-02-01 MED ORDER — SODIUM CHLORIDE 0.9 % IR SOLN
Status: DC | PRN
  Administered 2017-02-01: 1000 mL

## 2017-02-01 MED ORDER — METOPROLOL TARTRATE 50 MG PO TABS
50.0000 mg | ORAL_TABLET | Freq: Two times a day (BID) | ORAL | Status: DC
  Administered 2017-02-01 – 2017-02-04 (×7): 50 mg via ORAL
  Filled 2017-02-01 (×7): qty 1

## 2017-02-01 MED ORDER — ENOXAPARIN SODIUM 60 MG/0.6ML ~~LOC~~ SOLN
60.0000 mg | SUBCUTANEOUS | Status: DC
  Administered 2017-02-02 – 2017-02-04 (×3): 60 mg via SUBCUTANEOUS
  Filled 2017-02-01 (×3): qty 0.6

## 2017-02-01 MED ORDER — SUCCINYLCHOLINE CHLORIDE 20 MG/ML IJ SOLN
INTRAMUSCULAR | Status: DC | PRN
  Administered 2017-02-01: 120 mg via INTRAVENOUS

## 2017-02-01 MED ORDER — PROPOFOL 10 MG/ML IV BOLUS
INTRAVENOUS | Status: DC | PRN
  Administered 2017-02-01: 150 mg via INTRAVENOUS

## 2017-02-01 MED ORDER — ROCURONIUM BROMIDE 50 MG/5ML IV SOLN
INTRAVENOUS | Status: AC
  Filled 2017-02-01: qty 1

## 2017-02-01 MED ORDER — CIPROFLOXACIN IN D5W 400 MG/200ML IV SOLN
400.0000 mg | INTRAVENOUS | Status: AC
  Administered 2017-02-01: 400 mg via INTRAVENOUS
  Filled 2017-02-01: qty 200

## 2017-02-01 MED ORDER — POVIDONE-IODINE 10 % EX OINT
TOPICAL_OINTMENT | CUTANEOUS | Status: DC | PRN
  Administered 2017-02-01: 1 via TOPICAL

## 2017-02-01 MED ORDER — GLYCOPYRROLATE 0.2 MG/ML IJ SOLN
INTRAMUSCULAR | Status: AC
  Filled 2017-02-01: qty 4

## 2017-02-01 MED ORDER — SODIUM CHLORIDE 0.9 % IV SOLN
INTRAVENOUS | Status: DC
  Administered 2017-02-01: 11:00:00 via INTRAVENOUS

## 2017-02-01 SURGICAL SUPPLY — 67 items
APPLIER CLIP 11 MED OPEN (CLIP)
APPLIER CLIP 13 LRG OPEN (CLIP)
APR CLP LRG 13 20 CLIP (CLIP)
APR CLP MED 11 20 MLT OPN (CLIP)
BAG HAMPER (MISCELLANEOUS) ×3 IMPLANT
BARRIER SKIN 2 3/4 (OSTOMY) IMPLANT
BARRIER SKIN 2 3/4 INCH (OSTOMY)
BARRIER SKIN OD2.25 2 3/4 FLNG (OSTOMY) IMPLANT
BRR SKN FLT 2.75X2.25 2 PC (OSTOMY)
CELLS DAT CNTRL 66122 CELL SVR (MISCELLANEOUS) ×1 IMPLANT
CHLORAPREP W/TINT 26ML (MISCELLANEOUS) ×3 IMPLANT
CLAMP POUCH DRAINAGE QUIET (OSTOMY) IMPLANT
CLIP APPLIE 11 MED OPEN (CLIP) IMPLANT
CLIP APPLIE 13 LRG OPEN (CLIP) IMPLANT
CLOTH BEACON ORANGE TIMEOUT ST (SAFETY) ×3 IMPLANT
COVER LIGHT HANDLE STERIS (MISCELLANEOUS) ×6 IMPLANT
DRAPE WARM FLUID 44X44 (DRAPE) ×3 IMPLANT
DRSG OPSITE POSTOP 4X10 (GAUZE/BANDAGES/DRESSINGS) ×3 IMPLANT
DRSG OPSITE POSTOP 4X8 (GAUZE/BANDAGES/DRESSINGS) ×2 IMPLANT
ELECT BLADE 6 FLAT ULTRCLN (ELECTRODE) IMPLANT
ELECT REM PT RETURN 9FT ADLT (ELECTROSURGICAL) ×3
ELECTRODE REM PT RTRN 9FT ADLT (ELECTROSURGICAL) ×1 IMPLANT
GAUZE SPONGE 4X4 12PLY STRL (GAUZE/BANDAGES/DRESSINGS) ×3 IMPLANT
GLOVE BIOGEL PI IND STRL 7.0 (GLOVE) ×2 IMPLANT
GLOVE BIOGEL PI INDICATOR 7.0 (GLOVE) ×4
GLOVE SURG SS PI 7.5 STRL IVOR (GLOVE) ×3 IMPLANT
GOWN STRL REUS W/TWL LRG LVL3 (GOWN DISPOSABLE) ×9 IMPLANT
HANDLE SUCTION POOLE (INSTRUMENTS) ×1 IMPLANT
INST SET MAJOR GENERAL (KITS) ×3 IMPLANT
KIT REMOVER STAPLE SKIN (MISCELLANEOUS) IMPLANT
KIT ROOM TURNOVER APOR (KITS) ×3 IMPLANT
LIGASURE IMPACT 36 18CM CVD LR (INSTRUMENTS) ×2 IMPLANT
MANIFOLD NEPTUNE II (INSTRUMENTS) ×3 IMPLANT
NDL HYPO 18GX1.5 BLUNT FILL (NEEDLE) ×1 IMPLANT
NEEDLE HYPO 18GX1.5 BLUNT FILL (NEEDLE) ×3 IMPLANT
NS IRRIG 1000ML POUR BTL (IV SOLUTION) ×6 IMPLANT
PACK ABDOMINAL MAJOR (CUSTOM PROCEDURE TRAY) ×3 IMPLANT
PAD ARMBOARD 7.5X6 YLW CONV (MISCELLANEOUS) ×3 IMPLANT
POUCH OSTOMY 2 3/4  H 3804 (WOUND CARE)
POUCH OSTOMY 2 3/4 H 3804 (WOUND CARE)
POUCH OSTOMY 2 PC DRNBL 2.75 (WOUND CARE) IMPLANT
RELOAD LINEAR CUT PROX 55 BLUE (ENDOMECHANICALS) ×6 IMPLANT
RELOAD PROXIMATE 75MM BLUE (ENDOMECHANICALS) IMPLANT
RELOAD STAPLE 55 3.8 BLU REG (ENDOMECHANICALS) IMPLANT
RELOAD STAPLE 75 3.8 BLU REG (ENDOMECHANICALS) IMPLANT
RETRACTOR WND ALEXIS 18 MED (MISCELLANEOUS) ×1 IMPLANT
RETRACTOR WND ALEXIS 25 LRG (MISCELLANEOUS) IMPLANT
RTRCTR WOUND ALEXIS 18CM MED (MISCELLANEOUS) ×3
RTRCTR WOUND ALEXIS 25CM LRG (MISCELLANEOUS)
SET BASIN LINEN APH (SET/KITS/TRAYS/PACK) ×3 IMPLANT
SPONGE LAP 18X18 X RAY DECT (DISPOSABLE) ×3 IMPLANT
STAPLER GUN LINEAR PROX 60 (STAPLE) ×2 IMPLANT
STAPLER PROXIMATE 55 BLUE (STAPLE) ×2 IMPLANT
STAPLER PROXIMATE 75MM BLUE (STAPLE) IMPLANT
STAPLER VISISTAT (STAPLE) ×3 IMPLANT
SUCTION POOLE HANDLE (INSTRUMENTS)
SUT CHROMIC 0 SH (SUTURE) IMPLANT
SUT CHROMIC 2 0 SH (SUTURE) ×2 IMPLANT
SUT CHROMIC 3 0 SH 27 (SUTURE) ×1 IMPLANT
SUT NOVA NAB GS-26 0 60 (SUTURE) ×6 IMPLANT
SUT PDS AB 0 CTX 60 (SUTURE) IMPLANT
SUT PROLENE 2 0 SH 30 (SUTURE) ×1 IMPLANT
SUT SILK 2 0 (SUTURE)
SUT SILK 2-0 18XBRD TIE 12 (SUTURE) ×1 IMPLANT
SUT SILK 3 0 SH CR/8 (SUTURE) ×2 IMPLANT
SYR 20CC LL (SYRINGE) ×3 IMPLANT
TRAY FOLEY CATH SILVER 16FR (SET/KITS/TRAYS/PACK) ×1 IMPLANT

## 2017-02-01 NOTE — Interval H&P Note (Signed)
History and Physical Interval Note:  02/01/2017 10:56 AM  Brandi [name]  has presented today for surgery, with the diagnosis of small bowel neoplasm  The various methods of treatment have been discussed with the patient and family. After consideration of risks, benefits and other options for treatment, the patient has consented to  Procedure(s): EXPLORATORY LAPAROTOMY (N/A) as a surgical intervention .  The patient's history has been reviewed, patient examined, no change in status, stable for surgery.  I have reviewed the patient's chart and labs.  Questions were answered to the patient's satisfaction.     Aviva Signs

## 2017-02-01 NOTE — Progress Notes (Signed)
Pt returned from OR. Vitals WNL, BP slightly elevated. LR started through PICC line and pt rates pain a 2/10. Will continue to monitor.

## 2017-02-01 NOTE — Progress Notes (Signed)
Pt being transported down to OR by staff.

## 2017-02-01 NOTE — H&P (View-Only) (Signed)
4 Days Post-Op  Subjective: Patient comfortable. No abdominal pain noted.  Objective: Vital signs in last 24 hours: Temp:  [97.7 F (36.5 C)-98.5 F (36.9 C)] 98.3 F (36.8 C) (04/19 0532) Pulse Rate:  [79-86] 79 (04/19 0532) Resp:  [11-27] 18 (04/19 0532) BP: (117-174)/(39-101) 145/71 (04/19 0532) SpO2:  [96 %-99 %] 98 % (04/19 0532) Last BM Date: 01/28/17  Intake/Output from previous day: 04/18 0701 - 04/19 0700 In: 1040 [P.O.:1040] Out: 1200 [Urine:1200] Intake/Output this shift: No intake/output data recorded.  General appearance: alert, cooperative and no distress GI: soft, non-tender; bowel sounds normal; no masses,  no organomegaly  Lab Results:   Recent Labs  01/30/17 0409 01/31/17 0604  WBC 6.5 6.1  HGB 9.3* 9.3*  HCT 30.8* 31.5*  PLT 273 253   BMET  Recent Labs  01/30/17 0409 01/31/17 0604  NA 140 140  K 3.3* 3.5  CL 108 106  CO2 26 27  GLUCOSE 70 83  BUN 7 9  CREATININE 0.83 0.86  CALCIUM 8.4* 8.5*   PT/INR No results for input(s): LABPROT, INR in the last 72 hours.  Studies/Results: No results found.  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 Plan: Patient is scheduled for exploratory laparotomy, possible partial small bowel resection tomorrow. The risks and benefits of the procedure including bleeding, infection, cardiopulmonary difficulties, anastomotic leak, and the possibility of a blood transfusion were fully explained to the patient, who gave informed consent.  LOS: 5 days    Aviva Signs 01/31/2017

## 2017-02-01 NOTE — Anesthesia Postprocedure Evaluation (Signed)
Anesthesia Post Note  Patient: Brandi [name]  Procedure(s) Performed: Procedure(s) (LRB): EXPLORATORY LAPAROTOMY (N/A) PARTIAL SMALL BOWEL RESECTION (N/A)  Patient location during evaluation: PACU Anesthesia Type: General Level of consciousness: awake and alert and oriented Pain management: pain level controlled Respiratory status: spontaneous breathing, nonlabored ventilation and patient connected to nasal cannula oxygen Cardiovascular status: blood pressure returned to baseline and stable Postop Assessment: no signs of nausea or vomiting Anesthetic complications: no     Last Vitals:  Vitals:   02/01/17 1125 02/01/17 1249  BP: 114/60   Pulse:    Resp: (!) 33   Temp:  36.9 C    Last Pain:  Vitals:   02/01/17 1300  TempSrc:   PainSc: 6                  Gill Delrossi

## 2017-02-01 NOTE — Op Note (Signed)
Patient:  Brandi [name]  DOB:  April 14, 1956  MRN:  [id number]   Preop Diagnosis:  Small bowel neoplasm  Postop Diagnosis:  Same  Procedure:  Exploratory laparotomy, partial small bowel resection  Surgeon:  Aviva Signs, M.D.  Anes:  Gen. endotracheal  Indications:  Patient is a 61 year old black female who initially presented [facility] with a hemoglobin of 3.9. She had a GI workup including a Givens capsule study which revealed a small bowel neoplasm in the midportion of the small bowel. No other source of her bleeding could be found. The patient now comes to the operating room for an exploratory laparotomy, possible small bowel resection. The risks and benefits of the procedure including bleeding, infection, cardiopulmonary difficulties, and the possibility of a blood transfusion were fully explained to the patient, who gave informed consent.  Procedure note:  The patient was placed in the supine position. After induction of general endotracheal anesthesia, the abdomen was prepped and draped using the usual sterile technique with DuraPrep. Surgical site confirmation was performed.  An upper midline incision was made down to the fascia. The peritoneal cavity was entered into without difficulty. The small bowel was then exteriorized. The small bowel was then inspected from the ligament of Treitz to the terminal ileum. In the midportion of the small bowel, a polypoid lesion was palpable. The small bowel was then inspected a second time and this was the only lesion that could be palpable. It was elected to proceed with resection. A GIA 55 stapler was placed proximally and distally to the lesion. The mesentery was divided using the LigaSure. A side-to-side enteroenterostomy was then performed using a GIA 55 stapler. The enterotomy was closed using a TA 60 stapler. The staple line was bolstered using 3-0 silk sutures. The specimen was opened in the operating room and a hemorrhagic polypoid  lesion was noted. It was sent to pathology for further examination. The mesenteric defect was closed using a 2-0 chromic gut running suture. The bowel was then returned into the abdominal cavity and orderly fashion. The fascia was reapproximated using an looped 0 Novafil running suture. Exparel was instilled into the surrounding wound. The skin was closed using staples. Betadine ointment and dressed with dressings were applied.  All tape and needle counts were correct at the end of the procedure. Patient was extubated in the operating room and transferred to PACU in stable condition.  Complications:  None  EBL:  50 mL  Specimen:  Small bowel

## 2017-02-01 NOTE — Anesthesia Procedure Notes (Signed)
Procedure Name: Intubation Date/Time: 02/01/2017 11:40 AM Performed by: Andree Elk, AMY A Pre-anesthesia Checklist: Patient identified, Timeout performed, Emergency Drugs available, Suction available and Patient being monitored Patient Re-evaluated:Patient Re-evaluated prior to inductionOxygen Delivery Method: Circle System Utilized Preoxygenation: Pre-oxygenation with 100% oxygen Intubation Type: IV induction, Rapid sequence and Cricoid Pressure applied Laryngoscope Size: Miller and 3 Grade View: Grade I Tube type: Oral Tube size: 7.0 mm Number of attempts: 1 Airway Equipment and Method: Stylet Placement Confirmation: ETT inserted through vocal cords under direct vision,  positive ETCO2 and breath sounds checked- equal and bilateral Secured at: 21 cm Tube secured with: Tape Dental Injury: Teeth and Oropharynx as per pre-operative assessment

## 2017-02-01 NOTE — Anesthesia Preprocedure Evaluation (Signed)
Anesthesia Evaluation  Patient identified by MRN, date of birth, ID band Patient awake    Reviewed: Allergy & Precautions, NPO status , Patient's Chart, lab work & pertinent test results  Airway Mallampati: II  TM Distance: >3 FB Neck ROM: Full    Dental  (+) Poor Dentition, Dental Advisory Given,    Pulmonary former smoker,    breath sounds clear to auscultation       Cardiovascular Exercise Tolerance: Poor hypertension, Pt. on medications  Rhythm:Regular Rate:Normal     Neuro/Psych Right side weakness CVA, Residual Symptoms    GI/Hepatic   Endo/Other  diabetes, Poorly Controlled, Type 2, Insulin DependentMorbid obesity  Renal/GU      Musculoskeletal   Abdominal   Peds  Hematology  (+) anemia ,   Anesthesia Other Findings   Reproductive/Obstetrics                             Anesthesia Physical Anesthesia Plan  ASA: III  Anesthesia Plan: General   Post-op Pain Management:    Induction: Intravenous, Rapid sequence and Cricoid pressure planned  Airway Management Planned: Oral ETT  Additional Equipment:   Intra-op Plan:   Post-operative Plan: Extubation in OR  Informed Consent: I have reviewed the patients History and Physical, chart, labs and discussed the procedure including the risks, benefits and alternatives for the proposed anesthesia with the patient or authorized representative who has indicated his/her understanding and acceptance.     Plan Discussed with:   Anesthesia Plan Comments:         Anesthesia Quick Evaluation

## 2017-02-01 NOTE — Transfer of Care (Signed)
Immediate Anesthesia Transfer of Care Note  Patient: Brandi [name]  Procedure(s) Performed: Procedure(s): EXPLORATORY LAPAROTOMY (N/A) PARTIAL SMALL BOWEL RESECTION (N/A)  Patient Location: PACU  Anesthesia Type:General  Level of Consciousness: awake, oriented and patient cooperative  Airway & Oxygen Therapy: Patient Spontanous Breathing and Patient connected to face mask oxygen  Post-op Assessment: Report given to RN and Post -op Vital signs reviewed and stable  Post vital signs: Reviewed and stable  Last Vitals:  Vitals:   02/01/17 1120 02/01/17 1125  BP:  114/60  Pulse:    Resp: (!) 53 (!) 33  Temp:      Last Pain:  Vitals:   02/01/17 1004  TempSrc: Oral  PainSc:       Patients Stated Pain Goal: 0 (01/41/03 0131)  Complications: No apparent anesthesia complications

## 2017-02-02 ENCOUNTER — Encounter: Payer: Self-pay | Admitting: Internal Medicine

## 2017-02-02 LAB — BASIC METABOLIC PANEL
Anion gap: 5 (ref 5–15)
BUN: 8 mg/dL (ref 6–20)
CALCIUM: 8.5 mg/dL — AB (ref 8.9–10.3)
CHLORIDE: 107 mmol/L (ref 101–111)
CO2: 28 mmol/L (ref 22–32)
CREATININE: 0.9 mg/dL (ref 0.44–1.00)
GFR calc Af Amer: >60 mL/min (ref >60)
GFR calc non Af Amer: >60 mL/min (ref >60)
GLUCOSE: 82 mg/dL (ref 65–99)
Potassium: 3.9 mmol/L (ref 3.5–5.1)
Sodium: 140 mmol/L (ref 135–145)

## 2017-02-02 LAB — PHOSPHORUS: Phosphorus: 3.9 mg/dL (ref 2.5–4.6)

## 2017-02-02 LAB — CBC
HEMATOCRIT: 31.8 % — AB (ref 36.0–46.0)
HEMOGLOBIN: 9.3 g/dL — AB (ref 12.0–15.0)
MCH: 22.4 pg — ABNORMAL LOW (ref 26.0–34.0)
MCHC: 29.2 g/dL — AB (ref 30.0–36.0)
MCV: 76.6 fL — AB (ref 78.0–100.0)
PLATELETS: 242 10*3/uL (ref 150–400)
RBC: 4.15 MIL/uL (ref 3.87–5.11)
WBC: 9.8 10*3/uL (ref 4.0–10.5)

## 2017-02-02 LAB — MAGNESIUM: Magnesium: 1.5 mg/dL — ABNORMAL LOW (ref 1.7–2.4)

## 2017-02-02 NOTE — Progress Notes (Signed)
PICC line dressing and caps changed.

## 2017-02-02 NOTE — Anesthesia Postprocedure Evaluation (Signed)
Anesthesia Post Note  Patient: Brandi [name]  Procedure(s) Performed: Procedure(s) (LRB): EXPLORATORY LAPAROTOMY (N/A) PARTIAL SMALL BOWEL RESECTION (N/A)  Patient location during evaluation: Nursing Unit Anesthesia Type: General Level of consciousness: awake and alert and oriented Pain management: pain level controlled Vital Signs Assessment: post-procedure vital signs reviewed and stable Respiratory status: spontaneous breathing Cardiovascular status: blood pressure returned to baseline and stable Postop Assessment: adequate PO intake Anesthetic complications: no     Last Vitals:  Vitals:   02/02/17 0529 02/02/17 1337  BP: 127/70 (!) 120/52  Pulse: 69 70  Resp: 20 20  Temp: 37.1 C 37.8 C    Last Pain:  Vitals:   02/02/17 1958  TempSrc:   PainSc: 0-No pain                 Robynne Roat

## 2017-02-02 NOTE — Progress Notes (Signed)
1 Day Post-Op  Subjective: Mild incisional pain. Alert and oriented.  Objective: Vital signs in last 24 hours: Temp:  [97.7 F (36.5 C)-98.7 F (37.1 C)] 98.7 F (37.1 C) (04/21 0529) Pulse Rate:  [64-77] 69 (04/21 0529) Resp:  [11-53] 20 (04/21 0529) BP: (112-169)/(55-87) 127/70 (04/21 0529) SpO2:  [92 %-100 %] 92 % (04/21 0529) Weight:  [282 lb (127.9 kg)] 282 lb (127.9 kg) (04/20 1004) Last BM Date: 01/30/17  Intake/Output from previous day: 04/20 0701 - 04/21 0700 In: 3475 [P.O.:1200; I.V.:2275] Out: 50 [Blood:50] Intake/Output this shift: No intake/output data recorded.  General appearance: alert, cooperative and no distress Resp: clear to auscultation bilaterally Cardio: regular rate and rhythm, S1, S2 normal, no murmur, click, rub or gallop GI: Soft, dressings dry and intact. Occasional bowel sounds appreciated.  Lab Results:   Recent Labs  01/31/17 0604 02/01/17 0437  WBC 6.1 5.6  HGB 9.3* 9.3*  HCT 31.5* 31.8*  PLT 253 242   BMET  Recent Labs  01/31/17 0604 02/01/17 0437  NA 140 141  K 3.5 3.8  CL 106 107  CO2 27 27  GLUCOSE 83 75  BUN 9 6  CREATININE 0.86 0.79  CALCIUM 8.5* 8.6*   PT/INR No results for input(s): LABPROT, INR in the last 72 hours.  Studies/Results: No results found.  Anti-infectives: Anti-infectives    Start     Dose/Rate Route Frequency Ordered Stop   02/01/17 1030  ciprofloxacin (CIPRO) IVPB 400 mg     400 mg 200 mL/hr over 60 Minutes Intravenous On call to O.R. 02/01/17 1004 02/01/17 1228      Assessment/Plan: s/p Procedure(s): EXPLORATORY LAPAROTOMY PARTIAL SMALL BOWEL RESECTION Impression: Stable on postoperative day 1. Labs pending. Will advance to full liquid diet. We'll get patient up to chair.  LOS: 7 days    Aviva Signs 02/02/2017

## 2017-02-02 NOTE — Addendum Note (Signed)
Addendum  created 02/02/17 2101 by Ollen Bowl, CRNA   Sign clinical note

## 2017-02-02 NOTE — Progress Notes (Signed)
Status post small bowel resection yesterday hemodynamically stable for dyspnea hemoglobin 9.3 would check electrolytes and hemoglobin in a.m. [name] [id number]:[id number] DOB: Aug 31, 1956 DOA: 01/25/2017 PCP: Barney Drain, MD   Physical Exam: Blood pressure 127/70, pulse 69, temperature 98.7 F (37.1 C), temperature source Oral, resp. rate 20, height 5\' 4"  (1.626 m), weight 127.9 kg (282 lb), SpO2 92 %. Lungs clear. Diminished breath sounds in bases heart regular no murmurs gallops or rubs. Obese some incisional discomfort no rebound tenderness   Investigations:  Recent Results (from the past 240 hour(s))  MRSA PCR Screening     Status: None   Collection Time: 01/25/17 11:30 AM  Result Value Ref Range Status   MRSA by PCR NEGATIVE NEGATIVE Final    Comment:        The GeneXpert MRSA Assay (FDA approved for NASAL specimens only), is one component of a comprehensive MRSA colonization surveillance program. It is not intended to diagnose MRSA infection nor to guide or monitor treatment for MRSA infections.   Surgical pcr screen     Status: None   Collection Time: 02/01/17  1:01 AM  Result Value Ref Range Status   MRSA, PCR NEGATIVE NEGATIVE Final   Staphylococcus aureus NEGATIVE NEGATIVE Final    Comment:        The Xpert SA Assay (FDA approved for NASAL specimens in patients over 71 years of age), is one component of a comprehensive surveillance program.  Test performance has been validated by [facility] for patients greater than or equal to 68 year old. It is not intended to diagnose infection nor to guide or monitor treatment.      Basic Metabolic Panel:  Recent Labs  01/31/17 0604 02/01/17 0437 02/02/17 0620  NA 140 141 140  K 3.5 3.8 3.9  CL 106 107 107  CO2 27 27 28   GLUCOSE 83 75 82  BUN 9 6 8   CREATININE 0.86 0.79 0.90  CALCIUM 8.5* 8.6* 8.5*  MG 1.7  --  1.5*  PHOS 3.3  --  3.9   Liver Function Tests: No results for input(s): AST, ALT, ALKPHOS,  BILITOT, PROT, ALBUMIN in the last 72 hours.   CBC:  Recent Labs  02/01/17 0437 02/02/17 0620  WBC 5.6 9.8  NEUTROABS 3.4  --   HGB 9.3* 9.3*  HCT 31.8* 31.8*  MCV 75.9* 76.6*  PLT 242 242    No results found.    Medications:   Impression: Principal Problem:   Symptomatic anemia Active Problems:   Obesity   Microcytic anemia   DM type 2 (diabetes mellitus, type 2) (HCC)   Small bowel mass     Plan: CBC and BMP in a.m.  Consultants: Chest radiology and surgery   Procedures small bowel resection yesterday   Antibiotics:          Time spent: 30 minutes   LOS: 7 days   Brandi [name] M   02/02/2017, 11:14 AM

## 2017-02-03 LAB — CBC
HCT: 30.8 % — ABNORMAL LOW (ref 36.0–46.0)
Hemoglobin: 8.8 g/dL — ABNORMAL LOW (ref 12.0–15.0)
MCH: 21.9 pg — ABNORMAL LOW (ref 26.0–34.0)
MCHC: 28.6 g/dL — ABNORMAL LOW (ref 30.0–36.0)
MCV: 76.6 fL — ABNORMAL LOW (ref 78.0–100.0)
Platelets: 233 10*3/uL (ref 150–400)
RBC: 4.02 MIL/uL (ref 3.87–5.11)
WBC: 11 10*3/uL — ABNORMAL HIGH (ref 4.0–10.5)

## 2017-02-03 LAB — BASIC METABOLIC PANEL
Anion gap: 5 (ref 5–15)
BUN: 8 mg/dL (ref 6–20)
CHLORIDE: 104 mmol/L (ref 101–111)
CO2: 29 mmol/L (ref 22–32)
CREATININE: 0.84 mg/dL (ref 0.44–1.00)
Calcium: 8.5 mg/dL — ABNORMAL LOW (ref 8.9–10.3)
GFR calc Af Amer: >60 mL/min (ref >60)
GFR calc non Af Amer: >60 mL/min (ref >60)
GLUCOSE: 71 mg/dL (ref 65–99)
POTASSIUM: 3.7 mmol/L (ref 3.5–5.1)
Sodium: 138 mmol/L (ref 135–145)

## 2017-02-03 MED ORDER — MAGNESIUM HYDROXIDE 400 MG/5ML PO SUSP
30.0000 mL | Freq: Two times a day (BID) | ORAL | Status: DC
  Administered 2017-02-03 (×2): 30 mL via ORAL
  Filled 2017-02-03 (×2): qty 30

## 2017-02-03 MED ORDER — MAGNESIUM SULFATE 2 GM/50ML IV SOLN
2.0000 g | Freq: Once | INTRAVENOUS | Status: AC
Start: 2017-02-03 — End: 2017-02-03
  Administered 2017-02-03: 2 g via INTRAVENOUS
  Filled 2017-02-03: qty 50

## 2017-02-03 MED ORDER — KCL IN DEXTROSE-NACL 20-5-0.45 MEQ/L-%-% IV SOLN
INTRAVENOUS | Status: DC
  Administered 2017-02-03 – 2017-02-04 (×2): via INTRAVENOUS

## 2017-02-03 NOTE — Progress Notes (Signed)
2 Days Post-Op  Subjective: Patient denies any pain. No bowel movement or flatus yet.  Objective: Vital signs in last 24 hours: Temp:  [99.3 F (37.4 C)-101.4 F (38.6 C)] 100.6 F (38.1 C) (04/22 0609) Pulse Rate:  [66-70] 68 (04/22 0609) Resp:  [20] 20 (04/22 0609) BP: (120-147)/(51-59) 147/51 (04/22 0609) SpO2:  [91 %-96 %] 96 % (04/22 0609) Last BM Date: 01/30/17  Intake/Output from previous day: 04/21 0701 - 04/22 0700 In: 2540 [P.O.:480; I.V.:2060] Out: 500 [Urine:500] Intake/Output this shift: No intake/output data recorded.  General appearance: alert, cooperative and no distress Resp: clear to auscultation bilaterally Cardio: regular rate and rhythm, S1, S2 normal, no murmur, click, rub or gallop GI: Soft, incision healing well. Minimal bowel sounds appreciated.  Lab Results:   Recent Labs  02/02/17 0620 02/03/17 0622  WBC 9.8 11.0*  HGB 9.3* 8.8*  HCT 31.8* 30.8*  PLT 242 233   BMET  Recent Labs  02/02/17 0620 02/03/17 0622  NA 140 138  K 3.9 3.7  CL 107 104  CO2 28 29  GLUCOSE 82 71  BUN 8 8  CREATININE 0.90 0.84  CALCIUM 8.5* 8.5*   PT/INR No results for input(s): LABPROT, INR in the last 72 hours.  Studies/Results: No results found.  Anti-infectives: Anti-infectives    Start     Dose/Rate Route Frequency Ordered Stop   02/01/17 1030  ciprofloxacin (CIPRO) IVPB 400 mg     400 mg 200 mL/hr over 60 Minutes Intravenous On call to O.R. 02/01/17 1004 02/01/17 1228      Assessment/Plan: s/p Procedure(s): EXPLORATORY LAPAROTOMY PARTIAL SMALL BOWEL RESECTION Impression: Stable on postoperative day 2. We will ambulate patient. Adjust IV fluids. Will supplement magnesium. Awaiting return of bowel function.  LOS: 8 days    Aviva Signs 02/03/2017

## 2017-02-03 NOTE — Progress Notes (Signed)
Patient postop day 1 with small bowel resection hemoglobin stable at 8.8 no further evidence of bleeding some incisional discomfort as anticipated patient alert and oriented tolerating orals well RAEVIN [name] [id number]:[id number] DOB: 1956/05/13 DOA: 01/25/2017 PCP: Brandi Drain, MD   Physical Exam: Blood pressure (!) 147/51, pulse 68, temperature (!) 100.6 F (38.1 C), temperature source Oral, resp. rate 20, height 5\' 4"  (1.626 m), weight 127.9 kg (282 lb), SpO2 96 %. Lungs diminished breath sounds to bases no rales no wheezes no rhonchi appreciable heart regular rhythm no S3-S4 no heaves thrills rubs abdomen soft nontender bowel sounds normoactive some incisional discomfort noted   Investigations:  Recent Results (from the past 240 hour(s))  MRSA PCR Screening     Status: None   Collection Time: 01/25/17 11:30 AM  Result Value Ref Range Status   MRSA by PCR NEGATIVE NEGATIVE Final    Comment:        The GeneXpert MRSA Assay (FDA approved for NASAL specimens only), is one component of a comprehensive MRSA colonization surveillance program. It is not intended to diagnose MRSA infection nor to guide or monitor treatment for MRSA infections.   Surgical pcr screen     Status: None   Collection Time: 02/01/17  1:01 AM  Result Value Ref Range Status   MRSA, PCR NEGATIVE NEGATIVE Final   Staphylococcus aureus NEGATIVE NEGATIVE Final    Comment:        The Xpert SA Assay (FDA approved for NASAL specimens in patients over 87 years of age), is one component of a comprehensive surveillance program.  Test performance has been validated by [facility] for patients greater than or equal to 101 year old. It is not intended to diagnose infection nor to guide or monitor treatment.      Basic Metabolic Panel:  Recent Labs  02/02/17 0620 02/03/17 0622  NA 140 138  K 3.9 3.7  CL 107 104  CO2 28 29  GLUCOSE 82 71  BUN 8 8  CREATININE 0.90 0.84  CALCIUM 8.5* 8.5*  MG 1.5*  --    PHOS 3.9  --    Liver Function Tests: No results for input(s): AST, ALT, ALKPHOS, BILITOT, PROT, ALBUMIN in the last 72 hours.   CBC:  Recent Labs  02/01/17 0437 02/02/17 0620 02/03/17 0622  WBC 5.6 9.8 11.0*  NEUTROABS 3.4  --   --   HGB 9.3* 9.3* 8.8*  HCT 31.8* 31.8* 30.8*  MCV 75.9* 76.6* 76.6*  PLT 242 242 233    No results found.    Medications:   Impression:  Principal Problem:   Symptomatic anemia Active Problems:   Obesity   Microcytic anemia   DM type 2 (diabetes mellitus, type 2) (HCC)   Small bowel mass     Plan: Continue advancing diet and analgesia as per surgery  Consultants: Gastroenterology and surgery   Procedures   Antibiotics:          Time spent: 30 minutes   LOS: 8 days   Brandi [name] M   02/03/2017, 11:09 AM

## 2017-02-04 ENCOUNTER — Encounter (HOSPITAL_COMMUNITY): Payer: Self-pay | Admitting: General Surgery

## 2017-02-04 LAB — TYPE AND SCREEN
ABO/RH(D): A POS
ANTIBODY SCREEN: NEGATIVE
Unit division: 0
Unit division: 0

## 2017-02-04 LAB — CBC
HCT: 34 % — ABNORMAL LOW (ref 36.0–46.0)
HEMOGLOBIN: 10.1 g/dL — AB (ref 12.0–15.0)
MCH: 22.4 pg — ABNORMAL LOW (ref 26.0–34.0)
MCHC: 29.7 g/dL — AB (ref 30.0–36.0)
MCV: 75.6 fL — ABNORMAL LOW (ref 78.0–100.0)
Platelets: 297 10*3/uL (ref 150–400)
RBC: 4.5 MIL/uL (ref 3.87–5.11)
WBC: 12.5 10*3/uL — ABNORMAL HIGH (ref 4.0–10.5)

## 2017-02-04 LAB — BPAM RBC
BLOOD PRODUCT EXPIRATION DATE: 201805112359
Blood Product Expiration Date: 201805112359
Unit Type and Rh: 6200
Unit Type and Rh: 6200

## 2017-02-04 MED ORDER — HYDROCODONE-ACETAMINOPHEN 5-325 MG PO TABS
1.0000 | ORAL_TABLET | Freq: Four times a day (QID) | ORAL | Status: DC | PRN
  Administered 2017-02-04: 1 via ORAL
  Filled 2017-02-04: qty 1

## 2017-02-04 MED ORDER — METOPROLOL TARTRATE 100 MG PO TABS: 50.0000 mg | ORAL_TABLET | Freq: Two times a day (BID) | ORAL | 0 refills | Status: DC

## 2017-02-04 MED ORDER — HYDROCODONE-ACETAMINOPHEN 5-325 MG PO TABS: 1.0000 | ORAL_TABLET | Freq: Four times a day (QID) | ORAL | 0 refills | Status: DC | PRN

## 2017-02-04 NOTE — Care Management Important Message (Signed)
Important Message  Patient Details  Name: Brandi [name] MRN: [id number] Date of Birth: [birth year]   Medicare Important Message Given:  Yes    Ruari Duggan, Chauncey Reading, RN 02/04/2017, 2:05 PM

## 2017-02-04 NOTE — Care Management Note (Signed)
Case Management Note  Patient Details  Name: [name] MRN: [id number] Date of Birth: [birth year]    Expected Discharge Date:  02/04/17               Expected Discharge Plan:  Home/Self Care (vs home health)  In-House Referral:     Discharge planning Services  CM Consult  Post Acute Care Choice:    Choice offered to:     DME Arranged:    DME Agency:     HH Arranged:    [place] Agency:     Status of Service:  Completed, signed off  If discussed at [employer] of Stay Meetings, dates discussed:    Additional Comments: Discharging home today .  No CM needs.  Adoria Kawamoto, Chauncey Reading, RN 02/04/2017, 1:43 PM

## 2017-02-04 NOTE — Progress Notes (Signed)
Paient discharged home. With PICC line removed and site intact. Patient discharged with personal belongings and prescriptions.

## 2017-02-04 NOTE — Progress Notes (Signed)
3 Days Post-Op  Subjective: Patient has no complaints. Tolerating regular diet well. Has had bowel movements.  Objective: Vital signs in last 24 hours: Temp:  [99.7 F (37.6 C)-100 F (37.8 C)] 99.8 F (37.7 C) (04/23 0554) Pulse Rate:  [64-70] 64 (04/23 0554) Resp:  [20] 20 (04/23 0554) BP: (143-178)/(51-87) 154/62 (04/23 0554) SpO2:  [92 %-96 %] 92 % (04/23 0554) Last BM Date: 02/04/17  Intake/Output from previous day: 04/22 0701 - 04/23 0700 In: 1261.7 [P.O.:420; I.V.:841.7] Out: 600 [Urine:600] Intake/Output this shift: No intake/output data recorded.  General appearance: alert, cooperative and no distress Resp: clear to auscultation bilaterally Cardio: regular rate and rhythm, S1, S2 normal, no murmur, click, rub or gallop GI: Soft, incision healing well. Bowel sounds active.  Lab Results:   Recent Labs  02/03/17 0622 02/04/17 0528  WBC 11.0* 12.5*  HGB 8.8* 10.1*  HCT 30.8* 34.0*  PLT 233 297   BMET  Recent Labs  02/02/17 0620 02/03/17 0622  NA 140 138  K 3.9 3.7  CL 107 104  CO2 28 29  GLUCOSE 82 71  BUN 8 8  CREATININE 0.90 0.84  CALCIUM 8.5* 8.5*   PT/INR No results for input(s): LABPROT, INR in the last 72 hours.  Studies/Results: No results found.  Anti-infectives: Anti-infectives    Start     Dose/Rate Route Frequency Ordered Stop   02/01/17 1030  ciprofloxacin (CIPRO) IVPB 400 mg     400 mg 200 mL/hr over 60 Minutes Intravenous On call to O.R. 02/01/17 1004 02/01/17 1228      Assessment/Plan: s/p Procedure(s): EXPLORATORY LAPAROTOMY PARTIAL SMALL BOWEL RESECTION Impression: Stable postoperatively. Bowel function has returned. Final pathology is pending. Okay for discharge from my standpoint. Will see patient in 2 weeks in my office.  LOS: 9 days    Aviva Signs 02/04/2017

## 2017-02-04 NOTE — Discharge Instructions (Signed)
Open Small Bowel Resection, Care After This sheet gives you information about how to care for yourself after your procedure. Your health care provider may also give you more specific instructions. If you have problems or questions, contact your health care provider. What can I expect after the procedure? After the procedure, it is common to have:  Pain in your abdomen, especially along your incision. You will be given pain medicines to control this.  Tiredness. This is a normal part of the recovery process. Your energy level will return to normal over the next several weeks.  Constipation. You may be given a stool softener to help prevent this. Follow these instructions at home: Medicines   Take over-the-counter and prescription medicines only as told by your health care provider.  Do not drive or use heavy machinery while taking prescription pain medicine.  If you were prescribed an antibiotic medicine, use it as told by your health care provider. Do not stop using the antibiotic even if you start to feel better. Activity   Return to your normal activities as told by your health care provider. Ask your health care provider what activities are safe for you.  Do not lift anything that is heavier than 10 lb (4.5 kg) until your health care provider says that it is safe.  Take frequent rest breaks during the day as needed.  Try to take short walks every day for the amount of time that your health care provider suggests.  Avoid activities that require a lot of effort (are strenuous) for as long as told by your health care provider. Incision care   Follow instructions from your health care provider about how to take care of your incision. Make sure you:  Wash your hands with soap and water before you change your bandage (dressing). If soap and water are not available, use hand sanitizer.  Change your dressing as told by your health care provider.  Leave stitches (sutures), skin glue,  or adhesive strips in place. These skin closures may need to stay in place for 2 weeks or longer. If adhesive strip edges start to loosen and curl up, you may trim the loose edges. Do not remove adhesive strips completely unless your health care provider tells you to do that.  Check your incision area every day for signs of infection. Check for:  More redness, swelling, or pain.  More fluid or blood.  Warmth.  Pus or a bad smell.  Do not take baths, swim, or use a hot tub until your health care provider approves. Ask your health care provider if you can take showers. You may only be allowed to take sponge baths for bathing. General instructions   Continue to practice deep breathing and coughing. If it hurts to cough, try holding a pillow against your abdomen as you cough.  To prevent or treat constipation while you are taking prescription pain medicine, your health care provider may recommend that you:  Drink enough fluid to keep your urine clear or pale yellow.  Take over-the-counter or prescription medicines.  Eat foods that are high in fiber, such as fresh fruits and vegetables, whole grains, and beans.  Limit foods that are high in fat and processed sugars, such as fried and sweet foods.  Do not use any products that contain nicotine or tobacco as told by your health care provider. These include cigarettes and e-cigarettes. If you need help quitting, ask your health care provider.  Keep all follow-up visits as told by  your health care provider. This is important. Contact a health care provider if:  You have pain that is not relieved with medicine.  You do not feel like eating.  You feel nauseous or you vomit.  You have constipation that is not relieved with prescribed stool softeners.  You have more redness, swelling, or pain around your incision.  You have more fluid or blood coming from your incision.  Your incision feels warm to the touch.  You have pus or a bad  smell coming from your incision.  You have a fever. Get help right away if:  Your pain gets worse, even after you take pain medicine.  Your legs or arms hurt or become red or swollen.  You have chest pain.  You have trouble breathing. This information is not intended to replace advice given to you by your health care provider. Make sure you discuss any questions you have with your health care provider. Document Released: 03/05/2011 Document Revised: 07/10/2016 Document Reviewed: 07/02/2016 Elsevier Interactive Patient Education  2017 [employer].

## 2017-02-04 NOTE — ED Triage Notes (Signed)
Physician Discharge Summary  Brandi [name] ZDG:[id number] DOB: 03/23/1956 DOA: 01/25/2017  PCP: Maricela Curet, MD  Admit date: 01/25/2017 Discharge date: 02/04/2017   Recommendations for Outpatient Follow-up:  Patient is advised to take Lopressor only 50 mg twice a day orally with all her other previous hospital medicines as well as to take Norco 5-3 25 mg one tablet by mouth 4 times a day and follow-up in the office within 2 weeks time to assess bowel function and hemodynamics Discharge Diagnoses:  Principal Problem:   Symptomatic anemia Active Problems:   Obesity   Microcytic anemia   DM type 2 (diabetes mellitus, type 2) ([place])   Small bowel mass   Discharge Condition: Good  Filed Weights   01/29/17 0500 01/30/17 0500 02/01/17 1004  Weight: 128.1 kg (282 lb 6.6 oz) 128.1 kg (282 lb 6.6 oz) 127.9 kg (282 lb)    History of present illness:  Brandi [name] 60 year old black female who presented with a hemoglobin 3.6 who is significantly symptomatic with dizziness and malaise and fatigue stools were heme positive upper GI endoscopy as well as colonoscopy revealed nothing significant in the terms of bleeding and no active bleeding she hasn't had a capsule study small bowel revealed a significant polyp in the terminal ileum no terminal jejunum which had a trial of blood distally this was resected by general surgery and an anastomosis accomplished her bowel function was fine postoperatively day 12 and 3 she subsequently discharged the only change in her medicine is reduction in Lopressor to 50 twice a day by mouth as well as reduction in Norco to 5-3 25 by mouth 4 times a day when necessary. She was also transfused 4 units of packed cells prior to surgery  Hospital Course:    Procedures:    Consultations:  Gastroenterology and general surgery  Discharge Instructions  Discharge Instructions    Discharge instructions    Complete by:  As directed    Discharge patient     Complete by:  As directed    Discharge disposition:  01-Home or Self Care   Discharge patient date:  02/04/2017     Allergies as of 02/04/2017      Reactions   Codeine    Unknown       Medication List    STOP taking these medications   ALPRAZolam 0.5 MG tablet Commonly known as:  XANAX   Oxycodone HCl 10 MG Tabs     TAKE these medications   aspirin 325 MG tablet Take 325 mg by mouth daily.   HYDROcodone-acetaminophen 5-325 MG tablet Commonly known as:  NORCO/VICODIN Take 1 tablet by mouth every 6 (six) hours as needed for moderate pain.   lisinopril-hydrochlorothiazide 20-12.5 MG tablet Commonly known as:  PRINZIDE,ZESTORETIC Take 1 tablet by mouth daily.   metFORMIN 500 MG tablet Commonly known as:  GLUCOPHAGE Take 1 tablet by mouth 2 (two) times daily.   metoprolol 100 MG tablet Commonly known as:  LOPRESSOR Take 0.5 tablets (50 mg total) by mouth 2 (two) times daily. What changed:  how much to take   simvastatin 20 MG tablet Commonly known as:  ZOCOR Take 20 mg by mouth daily.      Allergies  Allergen Reactions  . Codeine     Unknown    Follow-up Information    Barney Drain, MD .   Specialty:  Gastroenterology Contact information: [address] [place] [address] [phone]        Aviva Signs, MD.  Schedule an appointment as soon as possible for a visit on 02/14/2017.   Specialty:  General Surgery Contact information: [address] [place] [address] [phone]            The results of significant diagnostics from this hospitalization (including imaging, microbiology, ancillary and laboratory) are listed below for reference.    Significant Diagnostic Studies: Dg Chest 2 View  Result Date: 01/25/2017 CLINICAL DATA:  Chest pain, ankle and feet swelling for 1 week, wheezing EXAM: CHEST  2 VIEW COMPARISON:  06/23/2012 FINDINGS: Cardiomegaly is noted. No infiltrate or pulmonary edema. Suboptimal exam due to patient's  large body habitus. Degenerative changes mid and lower thoracic spine. IMPRESSION: Cardiomegaly. No infiltrate or pulmonary edema. Degenerative changes mid and lower thoracic spine. Electronically Signed   By: Lahoma Crocker M.D.   On: 01/25/2017 10:25   Ct Entero Abd/pelvis W Contast  Result Date: 01/28/2017 CLINICAL DATA:  Abnormal capsule endoscopy. Polypoid lesion noted 3/4 of the way through small bowel. EXAM: CT ABDOMEN AND PELVIS WITH CONTRAST (ENTEROGRAPHY) TECHNIQUE: Multidetector CT of the abdomen and pelvis during bolus administration of intravenous contrast. Negative oral contrast was given. CONTRAST:  187mL ISOVUE-300 IOPAMIDOL (ISOVUE-300) INJECTION 61% COMPARISON:  None. FINDINGS: Diminished exam detail secondary to patient's body habitus. Lower chest: There is a small right pleural effusion. Overlying atelectasis noted in the right base. Hepatobiliary: There is an indeterminate and incompletely characterized low-attenuation structure within the right lobe of liver measuring approximately 3.6 cm, image 17 of series 2. Pancreas: No biliary dilatation. Spleen: The spleen is unremarkable. Adrenals/Urinary Tract: Normal appearance of the adrenal glands. The kidneys are unremarkable. Urinary bladder is unremarkable. Stomach/Bowel: The stomach appears normal. The small bowel loops have at normal course and caliber. No obstruction. No abnormal small bowel wall thickening or inflammation. Metallic filling defect within the rectum is presumed to represent endoscopy capsule. Vascular/Lymphatic: Aortic atherosclerosis. No aneurysm. No abdominal or pelvic adenopathy. No inguinal adenopathy. Reproductive: Enlarged partially calcified fibroid uterus noted. Other: Along the right pericolic gutter there is an indeterminate low-attenuation structure measuring 3.3 x 2.1 cm, image 52 of series 2. Musculoskeletal: Degenerative disc disease noted within the thoracic or lumbar spine. No aggressive lytic or sclerotic bone  lesion. IMPRESSION: 1. Diminished exam detail secondary to patient's body habitus. 2. No focal small bowel abnormality identified. 3. There is an indeterminate low-attenuation structure within right lobe of liver. Further investigation with liver protocol MRI or CT is advised. 4. Within the right pericolic gutter there is a low-attenuation structure this may reflect a chronic abnormality. Without previous imaging for comparison stability of this structure cannot be confirmed. Suggest followup imaging in 3-6 months to a assess for any temporal change in this abnormality. Electronically Signed   By: Kerby Moors M.D.   On: 01/28/2017 17:12    Microbiology: Recent Results (from the past 240 hour(s))  Surgical pcr screen     Status: None   Collection Time: 02/01/17  1:01 AM  Result Value Ref Range Status   MRSA, PCR NEGATIVE NEGATIVE Final   Staphylococcus aureus NEGATIVE NEGATIVE Final    Comment:        The Xpert SA Assay (FDA approved for NASAL specimens in patients over 49 years of age), is one component of a comprehensive surveillance program.  Test performance has been validated by [facility] for patients greater than or equal to 14 year old. It is not intended to diagnose infection nor to guide or monitor treatment.  Labs: Basic Metabolic Panel:  Recent Labs Lab 01/30/17 0409 01/31/17 0604 02/01/17 0437 02/02/17 0620 02/03/17 0622  NA 140 140 141 140 138  K 3.3* 3.5 3.8 3.9 3.7  CL 108 106 107 107 104  CO2 26 27 27 28 29   GLUCOSE 70 83 75 82 71  BUN 7 9 6 8 8   CREATININE 0.83 0.86 0.79 0.90 0.84  CALCIUM 8.4* 8.5* 8.6* 8.5* 8.5*  MG  --  1.7  --  1.5*  --   PHOS  --  3.3  --  3.9  --    Liver Function Tests:  Recent Labs Lab 01/30/17 0409  AST 18  ALT 13*  ALKPHOS 52  BILITOT 1.8*  PROT 5.9*  ALBUMIN 2.7*   No results for input(s): LIPASE, AMYLASE in the last 168 hours. No results for input(s): AMMONIA in the last 168 hours. CBC:  Recent  Labs Lab 01/31/17 0604 02/01/17 0437 02/02/17 0620 02/03/17 0622 02/04/17 0528  WBC 6.1 5.6 9.8 11.0* 12.5*  NEUTROABS  --  3.4  --   --   --   HGB 9.3* 9.3* 9.3* 8.8* 10.1*  HCT 31.5* 31.8* 31.8* 30.8* 34.0*  MCV 75.7* 75.9* 76.6* 76.6* 75.6*  PLT 253 242 242 233 297   Cardiac Enzymes: No results for input(s): CKTOTAL, CKMB, CKMBINDEX, TROPONINI in the last 168 hours. BNP: BNP (last 3 results)  Recent Labs  01/25/17 0918  BNP 958.0*    ProBNP (last 3 results) No results for input(s): PROBNP in the last 8760 hours.  CBG:  Recent Labs Lab 02/01/17 1002 02/01/17 1121 02/01/17 1253  GLUCAP 74 86 103*       Signed:  [facility] Pager: [phone] 02/04/2017, 1:49 PM

## 2017-02-04 NOTE — Progress Notes (Signed)
Patient encouraged to ambulate in hall with assistance. Patient has been nauseous with small amount of vomiting/spit. Zofran given IV per PRN order. Patient has visitor now. Patient encouraged to notify staff when willing to ambulate. Patient has ambulated from bed to bathroom couple times this shift. Patient states that she is passing gas and feeling more movement for BM soon. Will continue to encourage patient to ambulate.

## 2017-02-14 ENCOUNTER — Encounter: Payer: Self-pay | Admitting: General Surgery

## 2017-02-14 ENCOUNTER — Ambulatory Visit (INDEPENDENT_AMBULATORY_CARE_PROVIDER_SITE_OTHER): Payer: Self-pay | Admitting: General Surgery

## 2017-02-14 VITALS — BP 205/98 | HR 61 | Temp 97.3°F | Resp 18 | Ht 66.0 in | Wt 255.0 lb

## 2017-02-14 DIAGNOSIS — Z09 Encounter for follow-up examination after completed treatment for conditions other than malignant neoplasm: Secondary | ICD-10-CM

## 2017-02-14 NOTE — Progress Notes (Signed)
Subjective:     Brandi [name]  Status post small bowel resection for bleeding polypoid lesion. Patient doing well. She denies abdominal pain, blood per rectum. Her appetite is good. Objective:    BP (!) 205/98   Pulse 61   Temp 97.3 F (36.3 C)   Resp 18   Ht 5\' 6"  (1.676 m)   Wt 255 lb (115.7 kg)   BMI 41.16 kg/m   General:  alert, cooperative and no distress  Abdomen soft, incision healing well. Staples removed, Steri-Strips applied. Final pathology negative for malignancy.     Assessment:    Doing well postoperatively.    Plan:  Patient states her blood pressure was up as she just took her antihypertensives. She is to follow-up with Dr. Lorriane Shire in the near future. Follow up here as needed.

## 2017-02-22 ENCOUNTER — Encounter (HOSPITAL_COMMUNITY): Payer: Self-pay | Admitting: Emergency Medicine

## 2017-02-22 ENCOUNTER — Emergency Department (HOSPITAL_COMMUNITY)
Admission: EM | Admit: 2017-02-22 | Discharge: 2017-02-22 | Disposition: A | Discharge status: Home / Self Care | Payer: Medicare HMO | Attending: Emergency Medicine | Admitting: Emergency Medicine

## 2017-02-22 DIAGNOSIS — Y828 Other medical devices associated with adverse incidents: Secondary | ICD-10-CM | POA: Insufficient documentation

## 2017-02-22 DIAGNOSIS — E119 Type 2 diabetes mellitus without complications: Secondary | ICD-10-CM | POA: Insufficient documentation

## 2017-02-22 DIAGNOSIS — I10 Essential (primary) hypertension: Secondary | ICD-10-CM | POA: Diagnosis not present

## 2017-02-22 DIAGNOSIS — T8130XA Disruption of wound, unspecified, initial encounter: Secondary | ICD-10-CM

## 2017-02-22 DIAGNOSIS — Z79899 Other long term (current) drug therapy: Secondary | ICD-10-CM | POA: Diagnosis not present

## 2017-02-22 DIAGNOSIS — T8131XA Disruption of external operation (surgical) wound, not elsewhere classified, initial encounter: Secondary | ICD-10-CM | POA: Diagnosis not present

## 2017-02-22 DIAGNOSIS — Z7984 Long term (current) use of oral hypoglycemic drugs: Secondary | ICD-10-CM | POA: Diagnosis not present

## 2017-02-22 DIAGNOSIS — Z87891 Personal history of nicotine dependence: Secondary | ICD-10-CM | POA: Diagnosis not present

## 2017-02-22 NOTE — ED Triage Notes (Addendum)
Surgery 2 weeks ago, pt now complains of drainage from site - was supposed to call or see yesterday by person with pt states she does not know what happened  Dr Arnoldo Morale was surgeon

## 2017-02-22 NOTE — ED Provider Notes (Signed)
[facility] DEPT Provider Note   CSN: [id number] Arrival date & time: 02/22/17  1155     History   Chief Complaint Chief Complaint  Patient presents with  . Drainage from Incision    HPI Brandi [name] is a 61 y.o. female.  HPI 61 year old female who presents with wound dehiscence. She is a history of obesity, diabetes, and hypertension. She underwent a partial small bowel resection for bleeding polypoid lesion by Dr. Arnoldo Morale on 02/01/2017. She has been doing well since the surgery. About one week ago she noticed that part of her wound spontaneously dehisced and opened. Her son did call Dr. Arnoldo Morale office and it was recommended that she keep this clean and dry. She has not had increased redness or swelling around her wound. She denies any fevers, nausea or vomiting, confusion, abdominal pain, diarrhea. Denies any purulent drainage from her wound. She came to ED today as she is unclear of her follow-up time with Dr. Arnoldo Morale.   Past Medical History:  Diagnosis Date  . Anemia   . Diabetes mellitus without complication ([place])   . Hypertension   . Stroke [facility]) 2013    Patient Active Problem List   Diagnosis Date Noted  . Small bowel mass   . Symptomatic anemia 01/25/2017  . Microcytic anemia 01/25/2017  . DM type 2 (diabetes mellitus, type 2) ([place]) 01/25/2017  . CVA (cerebral vascular accident) ([place]) 06/23/2012  . HTN (hypertension) 06/23/2012  . Obesity 06/23/2012  . Expressive aphasia 06/23/2012  . Tobacco abuse 06/23/2012    Past Surgical History:  Procedure Laterality Date  . BOWEL RESECTION N/A 02/01/2017   Procedure: PARTIAL SMALL BOWEL RESECTION;  Surgeon: Aviva Signs, MD;  Location: [facility] ORS;  Service: General;  Laterality: N/A;  . COLONOSCOPY WITH PROPOFOL N/A 01/26/2017   Procedure: COLONOSCOPY WITH PROPOFOL;  Surgeon: Daneil Dolin, MD;  Location: [facility] ENDO SUITE;  Service: Endoscopy;  Laterality: N/A;  . cyst removed from right breast    .  ESOPHAGOGASTRODUODENOSCOPY (EGD) WITH PROPOFOL N/A 01/26/2017   Procedure: ESOPHAGOGASTRODUODENOSCOPY (EGD) WITH PROPOFOL;  Surgeon: Daneil Dolin, MD;  Location: [facility] ENDO SUITE;  Service: Endoscopy;  Laterality: N/A;  . GIVENS CAPSULE STUDY N/A 01/27/2017   Procedure: GIVENS CAPSULE STUDY;  Surgeon: Daneil Dolin, MD;  Location: [facility] END[facility] SUITE;  Service: Endoscopy;  Laterality: N/A;  . KNEE SURGERY  right knee  . LAPAROTOMY N/A 02/01/2017   Procedure: EXPLORATORY LAPAROTOMY;  Surgeon: Aviva Signs, MD;  Location: [facility] ORS;  Service: General;  Laterality: N/A;  . POLYPECTOMY  01/26/2017   Procedure: POLYPECTOMY;  Surgeon: Daneil Dolin, MD;  Location: [facility] END[facility] SUITE;  Service: Endoscopy;;  sigmoid and rectal    OB History    No data available       Home Medications    Prior to Admission medications   Medication Sig Start Date End Date Taking? Authorizing Provider  ALPRAZolam Duanne Moron) 0.5 MG tablet Take 1 tablet by mouth at bedtime. 02/20/17  Yes [provider]  aspirin 325 MG tablet Take 325 mg by mouth daily.   Yes [provider]  lisinopril-hydrochlorothiazide (PRINZIDE,ZESTORETIC) 20-12.5 MG per tablet Take 1 tablet by mouth daily.   Yes [provider]  metFORMIN (GLUCOPHAGE) 500 MG tablet Take 1 tablet by mouth 2 (two) times daily. 11/16/16  Yes [provider]  metoprolol (LOPRESSOR) 100 MG tablet Take 0.5 tablets (50 mg total) by mouth 2 (two) times daily. 02/04/17  Yes Lucia Gaskins, MD  Oxycodone  HCl 10 MG TABS Take 1 tablet by mouth 4 (four) times daily. 02/20/17  Yes [provider]  simvastatin (ZOCOR) 20 MG tablet Take 20 mg by mouth daily.   Yes [provider]  HYDROcodone-acetaminophen (NORCO/VICODIN) 5-325 MG tablet Take 1 tablet by mouth every 6 (six) hours as needed for moderate pain. Patient not taking: Reported on 02/22/2017 02/04/17   Lucia Gaskins, MD    Family History Family History  Problem Relation Age of  Onset  . Heart attack Father   . Ulcers Mother        stomach    Social History Social History  Substance Use Topics  . Smoking status: Former Smoker    Packs/day: 1.00    Years: 25.00    Types: Cigarettes    Quit date: 2013  . Smokeless tobacco: Never Used  . Alcohol use No     Allergies   Codeine   Review of Systems Review of Systems  Constitutional: Negative for fever.  Respiratory: Negative for shortness of breath.   Cardiovascular: Negative for chest pain.  Gastrointestinal: Negative for abdominal pain.  Genitourinary: Negative for difficulty urinating.  Skin: Positive for wound.  Hematological: Does not bruise/bleed easily.  Psychiatric/Behavioral: Negative for confusion.  All other systems reviewed and are negative.    Physical Exam Updated Vital Signs BP (!) 164/80 (BP Location: Right Arm)   Pulse 62   Temp 98 F (36.7 C) (Oral)   Resp 15   Ht 5\' 6"  (1.676 m)   Wt 255 lb (115.7 kg)   SpO2 94%   BMI 41.16 kg/m   Physical Exam   Physical Exam  Nursing note and vitals reviewed. Constitutional: Well developed, well nourished, non-toxic, and in no acute distress Head: Normocephalic and atraumatic.  Mouth/Throat: Oropharynx is clear and moist.  Neck: Normal range of motion. Neck supple.  Cardiovascular: Normal rate and regular rhythm.   Pulmonary/Chest: Effort normal and breath sounds normal.  Abdominal: Soft. There is no tenderness. There is no rebound and no guarding. see picture above for wound. No purulent drainage.  Musculoskeletal: Normal range of motion.  Neurological: Alert, no facial droop, fluent speech, moves all extremities symmetrically Skin: Skin is warm and dry.  Psychiatric: Cooperative  ED Treatments / Results  Labs (all labs ordered are listed, but only abnormal results are displayed) Labs Reviewed - No data to display  EKG  EKG Interpretation None       Radiology No results found.  Procedures Procedures (including  critical care time)  Medications Ordered in ED Medications - No data to display   Initial Impression / Assessment and Plan / ED Course  I have reviewed the triage vital signs and the nursing notes.  Pertinent labs & imaging results that were available during my care of the patient were reviewed by me and considered in my medical decision making (see chart for details).     Wound dehiscence one week ago after partial SBR by Dr. Arnoldo Morale on 4/20. No signs of infection overlying wound. No purulent drainage. No fevers or other systemic signs or symptoms of illness. She has benign abdomen. Spoke with Dr. Arnoldo Morale. He requests that patient call his office on Monday so that he will be able to see her in clinic to fix wound. Did not recommend antibiotics. recommended keeping wound clean and dry. Recommended cleaning BID with Qtip dipped in peroxide. Discussed strict return and follow-up instructions with patient. She expressed understanding of all discharge instructions and felt comfortable  with plan of care.  Final Clinical Impressions(s) / ED Diagnoses   Final diagnoses:  Wound dehiscence    New Prescriptions New Prescriptions   No medications on file     Forde Dandy, MD 02/22/17 1447

## 2017-02-22 NOTE — Discharge Instructions (Signed)
Please call Dr. Adline Mango office on Monday. He will then set up time for you to be seen on Tuesday to fix your wound.  Please keep wound CLEAN and DRY. You can dip a Q-tip in hydrogen peroxide and clean inside the wound twice daily. Keep wound covered with gauze.  Return without fail for worsening symptoms, including fever, severe abdominal pain, increased redness/swelling around wound, or any other symptoms concerning to you.

## 2017-02-22 NOTE — ED Triage Notes (Signed)
Pt. Is coming in with a open wound from an incision and drainage. Had stomach surgery 3 weeks ago and noticed the drainage on Monday.

## 2017-02-26 ENCOUNTER — Ambulatory Visit (INDEPENDENT_AMBULATORY_CARE_PROVIDER_SITE_OTHER): Payer: Self-pay | Admitting: General Surgery

## 2017-02-26 ENCOUNTER — Encounter: Payer: Self-pay | Admitting: General Surgery

## 2017-02-26 VITALS — BP 205/94 | HR 58 | Temp 98.2°F | Resp 18 | Ht 66.0 in | Wt 250.0 lb

## 2017-02-26 DIAGNOSIS — Z09 Encounter for follow-up examination after completed treatment for conditions other than malignant neoplasm: Secondary | ICD-10-CM

## 2017-02-26 NOTE — Patient Instructions (Signed)
Clean wound with q-tip and peroxide twice a day

## 2017-02-26 NOTE — Progress Notes (Signed)
Subjective:     Brandi [name]  Patient recently seen in the emergency room for a small opening that developed along the upper aspect of her incision. She denies any fevers. She was not started on an antibiotic. She has had clear yellow drainage was some blood noted since her ER visit. Objective:    BP (!) 205/94   Pulse (!) 58   Temp 98.2 F (36.8 C)   Resp 18   Ht 5\' 6"  (1.676 m)   Wt 250 lb (113.4 kg)   BMI 40.35 kg/m   General:  alert, cooperative and no distress  Abdomen is soft. A 2 x 3 cm superficial opening is present with granulation tissue present. This was cleaned with peroxide and a Q-tip. No purulent drainage was noted.     Assessment:    Superficial wound dehiscence, not infected.    Plan:   Clean wound with Q-tip and peroxide twice a day. Follow-up in 2 weeks.

## 2017-03-11 NOTE — Discharge Summary (Signed)
Physician Discharge Summary  Brandi [name] STM:[id number] DOB: Mar 30, 1956 DOA: 01/25/2017  PCP: Lucia Gaskins, MD  Admit date: 01/25/2017 Discharge date: 03/11/2017   Recommendations for Outpatient Follow-up:  Discharge with recommendations to take iron supplement Feosol 325 mg by mouth twice a day follow-up with surgery within 3-7 days time as well as follow-up with her primary care physician within the same period of time. Patient was advised to eat a soft diet until that time Discharge Diagnoses:  Principal Problem:   Symptomatic anemia Active Problems:   Obesity   Microcytic anemia   DM type 2 (diabetes mellitus, type 2) (HCC)   Small bowel mass   Discharge Condition: Good  Filed Weights   01/29/17 0500 01/30/17 0500 02/01/17 1004  Weight: 128.1 kg (282 lb 6.6 oz) 128.1 kg (282 lb 6.6 oz) 127.9 kg (282 lb)    History of present illness:  Patient is a 61 year old female who was admitted with a significant iron deficiency anemia hemoglobin 3.6 transfuse 4 units of packed cells continue to have abdominal discomfort and bleeding. Rectal was heme positive had upper GI and lower GI colonoscopy evaluation which was essentially unrevealing CT scan revealed a possible small bowel lesion this was also showing atrial of blood distally she was admitted for small bowel resection which he underwent had significant postoperative pain however was ambulated and her hemoglobin as well as glycemic control was monitored and she was very stable she was discharged on minimal opioid analgesia and told to follow-up with Dr. Arnoldo Morale and Dr. Lorriane Shire within 3-7 days time  Hospital Course:  See history of present illness above  Procedures:  Small bowel resection  Consultations:  Gastroenterology and surgery  Discharge Instructions  Discharge Instructions    Discharge instructions    Complete by:  As directed    Discharge patient    Complete by:  As directed    Discharge  disposition:  01-Home or Self Care   Discharge patient date:  02/04/2017     Allergies as of 02/04/2017      Reactions   Codeine    Unknown       Medication List    STOP taking these medications   ALPRAZolam 0.5 MG tablet Commonly known as:  XANAX   Oxycodone HCl 10 MG Tabs     TAKE these medications   aspirin 325 MG tablet Take 325 mg by mouth daily.   HYDROcodone-acetaminophen 5-325 MG tablet Commonly known as:  NORCO/VICODIN Take 1 tablet by mouth every 6 (six) hours as needed for moderate pain.   lisinopril-hydrochlorothiazide 20-12.5 MG tablet Commonly known as:  PRINZIDE,ZESTORETIC Take 1 tablet by mouth daily.   metFORMIN 500 MG tablet Commonly known as:  GLUCOPHAGE Take 1 tablet by mouth 2 (two) times daily.   metoprolol tartrate 100 MG tablet Commonly known as:  LOPRESSOR Take 0.5 tablets (50 mg total) by mouth 2 (two) times daily. What changed:  how much to take   simvastatin 20 MG tablet Commonly known as:  ZOCOR Take 20 mg by mouth daily.      Allergies  Allergen Reactions  . Codeine     Unknown    Follow-up Information    Danie Binder, MD.   Specialty:  Gastroenterology Contact information: [address] [place] [address] [phone]        Aviva Signs, MD. Schedule an appointment as soon as possible for a visit on 02/14/2017.   Specialty:  General Surgery Why:  10:15  Contact information: [address] [place] [address] [phone]            The results of significant diagnostics from this hospitalization (including imaging, microbiology, ancillary and laboratory) are listed below for reference.    Significant Diagnostic Studies: No results found.  Microbiology: No results found for this or any previous visit (from the past 240 hour(s)).   Labs: Basic Metabolic Panel: No results for input(s): NA, K, CL, CO2, GLUCOSE, BUN, CREATININE, CALCIUM, MG, PHOS in the last 168 hours. Liver Function  Tests: No results for input(s): AST, ALT, ALKPHOS, BILITOT, PROT, ALBUMIN in the last 168 hours. No results for input(s): LIPASE, AMYLASE in the last 168 hours. No results for input(s): AMMONIA in the last 168 hours. CBC: No results for input(s): WBC, NEUTROABS, HGB, HCT, MCV, PLT in the last 168 hours. Cardiac Enzymes: No results for input(s): CKTOTAL, CKMB, CKMBINDEX, TROPONINI in the last 168 hours. BNP: BNP (last 3 results)  Recent Labs  01/25/17 0918  BNP 958.0*    ProBNP (last 3 results) No results for input(s): PROBNP in the last 8760 hours.  CBG: No results for input(s): GLUCAP in the last 168 hours.     Signed:  Kare Dado Jerilynn Mages  [facility] Pager: [phone] 03/11/2017, 12:42 PM

## 2017-03-12 ENCOUNTER — Ambulatory Visit: Payer: Medicare HMO | Admitting: General Surgery

## 2018-06-30 ENCOUNTER — Other Ambulatory Visit: Payer: Self-pay

## 2018-06-30 ENCOUNTER — Encounter (HOSPITAL_COMMUNITY): Payer: Self-pay

## 2018-06-30 ENCOUNTER — Emergency Department (HOSPITAL_COMMUNITY)
Admission: EM | Admit: 2018-06-30 | Discharge: 2018-06-30 | Disposition: A | Discharge status: Home / Self Care | Payer: Medicare HMO | Attending: Emergency Medicine | Admitting: Emergency Medicine

## 2018-06-30 ENCOUNTER — Emergency Department (HOSPITAL_COMMUNITY): Payer: Medicare HMO

## 2018-06-30 DIAGNOSIS — Z79899 Other long term (current) drug therapy: Secondary | ICD-10-CM | POA: Diagnosis not present

## 2018-06-30 DIAGNOSIS — I10 Essential (primary) hypertension: Secondary | ICD-10-CM | POA: Diagnosis not present

## 2018-06-30 DIAGNOSIS — G8929 Other chronic pain: Secondary | ICD-10-CM | POA: Insufficient documentation

## 2018-06-30 DIAGNOSIS — Z8673 Personal history of transient ischemic attack (TIA), and cerebral infarction without residual deficits: Secondary | ICD-10-CM | POA: Insufficient documentation

## 2018-06-30 DIAGNOSIS — M25562 Pain in left knee: Secondary | ICD-10-CM | POA: Diagnosis not present

## 2018-06-30 DIAGNOSIS — E119 Type 2 diabetes mellitus without complications: Secondary | ICD-10-CM | POA: Diagnosis not present

## 2018-06-30 DIAGNOSIS — Z7984 Long term (current) use of oral hypoglycemic drugs: Secondary | ICD-10-CM | POA: Diagnosis not present

## 2018-06-30 DIAGNOSIS — M25561 Pain in right knee: Secondary | ICD-10-CM | POA: Insufficient documentation

## 2018-06-30 MED ORDER — HYDROCODONE-ACETAMINOPHEN 5-325 MG PO TABS: 1.0000 | ORAL_TABLET | Freq: Once | ORAL | Status: DC

## 2018-06-30 MED ORDER — TRAMADOL HCL 50 MG PO TABS
50.0000 mg | ORAL_TABLET | Freq: Once | ORAL | Status: AC
  Administered 2018-06-30: 50 mg via ORAL
  Filled 2018-06-30: qty 1

## 2018-06-30 MED ORDER — TRAMADOL HCL 50 MG PO TABS: 50.0000 mg | ORAL_TABLET | Freq: Four times a day (QID) | ORAL | 0 refills | Status: DC | PRN

## 2018-06-30 NOTE — Discharge Instructions (Addendum)
Call Dr. Ruthe Mannan office to arrange a follow-up appointment regarding your knee pain.  Wear the knee brace as needed for support, but do not wear continuously or at bedtime.

## 2018-06-30 NOTE — ED Triage Notes (Signed)
Pt is having bilateral knee pain. Started 1 month. Has arthritis in knees. Has taken Tylenol 1 hour ago for the pain without any relief. Pt always walks with a walker

## 2018-07-02 NOTE — ED Provider Notes (Signed)
[facility] EMERGENCY DEPARTMENT Provider Note   CSN: [id number] Arrival date & time: 06/30/18  1818     History   Chief Complaint Chief Complaint  Patient presents with  . Leg Pain    HPI Brandi [name] is a 62 y.o. female.  HPI   Brandi [name] is a 62 y.o. female who presents to the Emergency Department complaining of recurrent bilateral knee pain.  She complains of worsening pain for several days.  She has hx of chronic knee pain secondary to arthritis.  She has been using ACE wraps and applying Ben-Gay with minimal relief.  She denies swelling, redness, fever, chills, and recent injury.    Past Medical History:  Diagnosis Date  . Anemia   . Diabetes mellitus without complication ([place])   . Hypertension   . Stroke [facility]) 2013    Patient Active Problem List   Diagnosis Date Noted  . Small bowel mass   . Symptomatic anemia 01/25/2017  . Microcytic anemia 01/25/2017  . DM type 2 (diabetes mellitus, type 2) ([place]) 01/25/2017  . CVA (cerebral vascular accident) ([place]) 06/23/2012  . HTN (hypertension) 06/23/2012  . Obesity 06/23/2012  . Expressive aphasia 06/23/2012  . Tobacco abuse 06/23/2012    Past Surgical History:  Procedure Laterality Date  . BOWEL RESECTION N/A 02/01/2017   Procedure: PARTIAL SMALL BOWEL RESECTION;  Surgeon: Aviva Signs, MD;  Location: [facility] ORS;  Service: General;  Laterality: N/A;  . COLONOSCOPY WITH PROPOFOL N/A 01/26/2017   Procedure: COLONOSCOPY WITH PROPOFOL;  Surgeon: Daneil Dolin, MD;  Location: [facility] ENDO SUITE;  Service: Endoscopy;  Laterality: N/A;  . cyst removed from right breast    . ESOPHAGOGASTRODUODENOSCOPY (EGD) WITH PROPOFOL N/A 01/26/2017   Procedure: ESOPHAGOGASTRODUODENOSCOPY (EGD) WITH PROPOFOL;  Surgeon: Daneil Dolin, MD;  Location: [facility] ENDO SUITE;  Service: Endoscopy;  Laterality: N/A;  . GIVENS CAPSULE STUDY N/A 01/27/2017   Procedure: GIVENS CAPSULE STUDY;  Surgeon: Daneil Dolin, MD;  Location: [facility] ENDO SUITE;   Service: Endoscopy;  Laterality: N/A;  . KNEE SURGERY  right knee  . LAPAROTOMY N/A 02/01/2017   Procedure: EXPLORATORY LAPAROTOMY;  Surgeon: Aviva Signs, MD;  Location: [facility] ORS;  Service: General;  Laterality: N/A;  . POLYPECTOMY  01/26/2017   Procedure: POLYPECTOMY;  Surgeon: Daneil Dolin, MD;  Location: [facility] ENDO SUITE;  Service: Endoscopy;;  sigmoid and rectal     OB History   None      Home Medications    Prior to Admission medications   Medication Sig Start Date End Date Taking? Authorizing Provider  ALPRAZolam Duanne Moron) 0.5 MG tablet Take 1 tablet by mouth at bedtime. 02/20/17   [provider]  aspirin 325 MG tablet Take 325 mg by mouth daily.    [provider]  HYDROcodone-acetaminophen (NORCO/VICODIN) 5-325 MG tablet Take 1 tablet by mouth every 6 (six) hours as needed for moderate pain. Patient not taking: Reported on 02/22/2017 02/04/17   Lucia Gaskins, MD  lisinopril-hydrochlorothiazide (PRINZIDE,ZESTORETIC) 20-12.5 MG per tablet Take 1 tablet by mouth daily.    [provider]  metFORMIN (GLUCOPHAGE) 500 MG tablet Take 1 tablet by mouth 2 (two) times daily. 11/16/16   [provider]  metoprolol (LOPRESSOR) 100 MG tablet Take 0.5 tablets (50 mg total) by mouth 2 (two) times daily. 02/04/17   Lucia Gaskins, MD  Oxycodone HCl 10 MG TABS Take 1 tablet by mouth 4 (four) times daily. 02/20/17   [provider]  simvastatin (ZOCOR)  20 MG tablet Take 20 mg by mouth daily.    [provider]  traMADol (ULTRAM) 50 MG tablet Take 1 tablet (50 mg total) by mouth every 6 (six) hours as needed. 06/30/18   Kem Parkinson, PA-C    Family History Family History  Problem Relation Age of Onset  . Heart attack Father   . Ulcers Mother        stomach    Social History Social History   Tobacco Use  . Smoking status: Former Smoker    Packs/day: 1.00    Years: 25.00    Pack years: 25.00    Types: Cigarettes    Last attempt to  quit: 2013    Years since quitting: 6.7  . Smokeless tobacco: Never Used  Substance Use Topics  . Alcohol use: No  . Drug use: No    Comment: last used 20 yr     Allergies   Codeine   Review of Systems Review of Systems  Constitutional: Negative for chills and fever.  Respiratory: Negative for chest tightness and shortness of breath.   Cardiovascular: Negative for chest pain.  Musculoskeletal: Positive for arthralgias (bilateral knee pain). Negative for back pain and joint swelling.  Skin: Negative for color change and wound.  Neurological: Negative for weakness and numbness.     Physical Exam Updated Vital Signs BP (!) 193/93 (BP Location: Left Arm)   Pulse 60   Temp 98 F (36.7 C) (Oral)   Resp 16   Ht 5\' 6"  (1.676 m)   Wt 110.7 kg   SpO2 98%   BMI 39.38 kg/m   Physical Exam  Constitutional: She appears well-developed and well-nourished. No distress.  Cardiovascular: Normal rate, regular rhythm and intact distal pulses.  Pulmonary/Chest: Effort normal and breath sounds normal.  Musculoskeletal: She exhibits tenderness. She exhibits no edema.  Diffuse ttp of the bilateral knee.  Knees appear arthritic. No erythema, effusion, or step-off deformity.  DP pulse brisk, distal sensation intact. Calf is soft and NT.  Neurological: She is alert. No sensory deficit. She exhibits normal muscle tone.  Skin: Skin is warm and dry. Capillary refill takes less than 2 seconds. No erythema.  Nursing note and vitals reviewed.    ED Treatments / Results  Labs (all labs ordered are listed, but only abnormal results are displayed) Labs Reviewed - No data to display  EKG None  Radiology Dg Knee Complete 4 Views Left  Result Date: 06/30/2018 CLINICAL DATA:  Left knee pain since 1 month ago. EXAM: LEFT KNEE - COMPLETE 4+ VIEW COMPARISON:  None. FINDINGS: Moderate joint space narrowing of the patellofemoral and femorotibial compartments with spurring consistent with  osteoarthritis. No joint effusion, fracture nor intra-articular loose bodies. No soft tissue mass or mineralization. 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 Electronically Signed   By: Ashley Royalty M.D.   On: 06/30/2018 20:39   Dg Knee Complete 4 Views Right  Result Date: 06/30/2018 CLINICAL DATA:  Bilateral knee pain starting 1 month ago. Patient had a stroke. EXAM: RIGHT KNEE - COMPLETE 4+ VIEW COMPARISON:  07/13/2016 FINDINGS: Marked tricompartmental osteoarthritis of the right knee, greatest along the lateral femorotibial compartment with there is near bone-on-bone apposition is identified. This appears to have progressed since prior. No joint effusion or fracture. No suspicious osseous lesions. Small phlebolith is seen along the medial aspect of the knee. Soft tissues are otherwise unremarkable. IMPRESSION: Progression of tricompartmental osteoarthritis of the right knee, more so involving the lateral  femorotibial compartment where there is near bone-on-bone apposition seen. Electronically Signed   By: Ashley Royalty M.D.   On: 06/30/2018 20:36    Procedures Procedures (including critical care time)  Medications Ordered in ED Medications  traMADol (ULTRAM) tablet 50 mg (50 mg Oral Given 06/30/18 2138)     Initial Impression / Assessment and Plan / ED Course  I have reviewed the triage vital signs and the nursing notes.  Pertinent labs & imaging results that were available during my care of the patient were reviewed by me and considered in my medical decision making (see chart for details).     Likely acute on chronic pain.  No concerning sx's for septic joint.  NV intact.   Knee sleeves applied for comfort with wear instructions.  Referral given for local orthopedics.   Final Clinical Impressions(s) / ED Diagnoses   Final diagnoses:  Chronic pain of both knees    ED Discharge Orders         Ordered    traMADol (ULTRAM) 50 MG tablet  Every 6 hours PRN      06/30/18 2128           Kem Parkinson, PA-C 07/02/18 1551    Long, [facility], MD 07/03/18 [phone]

## 2019-05-26 ENCOUNTER — Other Ambulatory Visit: Payer: Self-pay

## 2019-05-26 DIAGNOSIS — Z20822 Contact with and (suspected) exposure to covid-19: Secondary | ICD-10-CM

## 2019-05-27 LAB — NOVEL CORONAVIRUS, NAA: SARS-CoV-2, NAA: NOT DETECTED

## 2021-05-25 DIAGNOSIS — I639 Cerebral infarction, unspecified: Secondary | ICD-10-CM | POA: Insufficient documentation

## 2021-05-25 DIAGNOSIS — I10 Essential (primary) hypertension: Secondary | ICD-10-CM | POA: Insufficient documentation

## 2021-05-25 DIAGNOSIS — M25561 Pain in right knee: Secondary | ICD-10-CM | POA: Insufficient documentation

## 2021-05-25 DIAGNOSIS — E782 Mixed hyperlipidemia: Secondary | ICD-10-CM | POA: Diagnosis present

## 2021-05-25 DIAGNOSIS — J0100 Acute maxillary sinusitis, unspecified: Secondary | ICD-10-CM | POA: Insufficient documentation

## 2021-05-25 DIAGNOSIS — F5104 Psychophysiologic insomnia: Secondary | ICD-10-CM | POA: Insufficient documentation

## 2021-05-25 DIAGNOSIS — J302 Other seasonal allergic rhinitis: Secondary | ICD-10-CM | POA: Insufficient documentation

## 2021-06-20 ENCOUNTER — Ambulatory Visit: Payer: Medicare HMO | Admitting: Orthopedic Surgery

## 2021-07-23 DIAGNOSIS — E878 Other disorders of electrolyte and fluid balance, not elsewhere classified: Secondary | ICD-10-CM | POA: Insufficient documentation

## 2021-07-23 DIAGNOSIS — D509 Iron deficiency anemia, unspecified: Secondary | ICD-10-CM | POA: Insufficient documentation

## 2021-07-23 DIAGNOSIS — N1831 Chronic kidney disease, stage 3a: Secondary | ICD-10-CM | POA: Insufficient documentation

## 2021-07-24 DIAGNOSIS — F419 Anxiety disorder, unspecified: Secondary | ICD-10-CM | POA: Insufficient documentation

## 2021-08-23 ENCOUNTER — Encounter: Payer: Self-pay | Admitting: Internal Medicine

## 2021-10-24 DIAGNOSIS — R062 Wheezing: Secondary | ICD-10-CM | POA: Insufficient documentation

## 2021-11-20 ENCOUNTER — Emergency Department (HOSPITAL_COMMUNITY): Payer: Medicare Other

## 2021-11-20 ENCOUNTER — Other Ambulatory Visit: Payer: Self-pay

## 2021-11-20 ENCOUNTER — Inpatient Hospital Stay (HOSPITAL_COMMUNITY)
Admission: EM | Admit: 2021-11-20 | Discharge: 2021-11-30 | DRG: 552 | Disposition: A | Discharge status: Rehabilitation Facility | Payer: Medicare Other | Attending: Internal Medicine | Admitting: Internal Medicine

## 2021-11-20 ENCOUNTER — Encounter (HOSPITAL_COMMUNITY): Payer: Self-pay

## 2021-11-20 DIAGNOSIS — M4802 Spinal stenosis, cervical region: Secondary | ICD-10-CM | POA: Diagnosis present

## 2021-11-20 DIAGNOSIS — R3915 Urgency of urination: Secondary | ICD-10-CM | POA: Diagnosis present

## 2021-11-20 DIAGNOSIS — F419 Anxiety disorder, unspecified: Secondary | ICD-10-CM | POA: Diagnosis present

## 2021-11-20 DIAGNOSIS — Z9049 Acquired absence of other specified parts of digestive tract: Secondary | ICD-10-CM

## 2021-11-20 DIAGNOSIS — I10 Essential (primary) hypertension: Secondary | ICD-10-CM | POA: Diagnosis not present

## 2021-11-20 DIAGNOSIS — W19XXXA Unspecified fall, initial encounter: Secondary | ICD-10-CM | POA: Diagnosis not present

## 2021-11-20 DIAGNOSIS — Z885 Allergy status to narcotic agent status: Secondary | ICD-10-CM

## 2021-11-20 DIAGNOSIS — D72829 Elevated white blood cell count, unspecified: Secondary | ICD-10-CM

## 2021-11-20 DIAGNOSIS — R339 Retention of urine, unspecified: Secondary | ICD-10-CM | POA: Diagnosis not present

## 2021-11-20 DIAGNOSIS — Z6839 Body mass index (BMI) 39.0-39.9, adult: Secondary | ICD-10-CM

## 2021-11-20 DIAGNOSIS — E441 Mild protein-calorie malnutrition: Secondary | ICD-10-CM | POA: Diagnosis not present

## 2021-11-20 DIAGNOSIS — G8929 Other chronic pain: Secondary | ICD-10-CM | POA: Diagnosis present

## 2021-11-20 DIAGNOSIS — M47812 Spondylosis without myelopathy or radiculopathy, cervical region: Secondary | ICD-10-CM | POA: Diagnosis not present

## 2021-11-20 DIAGNOSIS — E119 Type 2 diabetes mellitus without complications: Secondary | ICD-10-CM

## 2021-11-20 DIAGNOSIS — Z87891 Personal history of nicotine dependence: Secondary | ICD-10-CM

## 2021-11-20 DIAGNOSIS — Y92239 Unspecified place in hospital as the place of occurrence of the external cause: Secondary | ICD-10-CM | POA: Diagnosis not present

## 2021-11-20 DIAGNOSIS — R29898 Other symptoms and signs involving the musculoskeletal system: Secondary | ICD-10-CM | POA: Diagnosis not present

## 2021-11-20 DIAGNOSIS — M2578 Osteophyte, vertebrae: Secondary | ICD-10-CM | POA: Diagnosis not present

## 2021-11-20 DIAGNOSIS — M48061 Spinal stenosis, lumbar region without neurogenic claudication: Secondary | ICD-10-CM | POA: Diagnosis present

## 2021-11-20 DIAGNOSIS — M5104 Intervertebral disc disorders with myelopathy, thoracic region: Secondary | ICD-10-CM | POA: Diagnosis not present

## 2021-11-20 DIAGNOSIS — E1122 Type 2 diabetes mellitus with diabetic chronic kidney disease: Secondary | ICD-10-CM | POA: Diagnosis not present

## 2021-11-20 DIAGNOSIS — I69331 Monoplegia of upper limb following cerebral infarction affecting right dominant side: Secondary | ICD-10-CM | POA: Diagnosis not present

## 2021-11-20 DIAGNOSIS — G4700 Insomnia, unspecified: Secondary | ICD-10-CM | POA: Diagnosis present

## 2021-11-20 DIAGNOSIS — I129 Hypertensive chronic kidney disease with stage 1 through stage 4 chronic kidney disease, or unspecified chronic kidney disease: Secondary | ICD-10-CM | POA: Diagnosis not present

## 2021-11-20 DIAGNOSIS — I69320 Aphasia following cerebral infarction: Secondary | ICD-10-CM | POA: Diagnosis not present

## 2021-11-20 DIAGNOSIS — D509 Iron deficiency anemia, unspecified: Secondary | ICD-10-CM | POA: Diagnosis present

## 2021-11-20 DIAGNOSIS — N179 Acute kidney failure, unspecified: Secondary | ICD-10-CM | POA: Diagnosis not present

## 2021-11-20 DIAGNOSIS — M546 Pain in thoracic spine: Secondary | ICD-10-CM | POA: Diagnosis not present

## 2021-11-20 DIAGNOSIS — E669 Obesity, unspecified: Secondary | ICD-10-CM | POA: Diagnosis present

## 2021-11-20 DIAGNOSIS — Z7982 Long term (current) use of aspirin: Secondary | ICD-10-CM

## 2021-11-20 DIAGNOSIS — M47816 Spondylosis without myelopathy or radiculopathy, lumbar region: Secondary | ICD-10-CM | POA: Diagnosis not present

## 2021-11-20 DIAGNOSIS — E876 Hypokalemia: Secondary | ICD-10-CM | POA: Diagnosis not present

## 2021-11-20 DIAGNOSIS — M4714 Other spondylosis with myelopathy, thoracic region: Principal | ICD-10-CM | POA: Diagnosis present

## 2021-11-20 DIAGNOSIS — Z8249 Family history of ischemic heart disease and other diseases of the circulatory system: Secondary | ICD-10-CM

## 2021-11-20 DIAGNOSIS — R531 Weakness: Secondary | ICD-10-CM | POA: Diagnosis present

## 2021-11-20 DIAGNOSIS — R5381 Other malaise: Secondary | ICD-10-CM | POA: Diagnosis not present

## 2021-11-20 DIAGNOSIS — K5900 Constipation, unspecified: Secondary | ICD-10-CM | POA: Diagnosis present

## 2021-11-20 DIAGNOSIS — M25561 Pain in right knee: Secondary | ICD-10-CM | POA: Diagnosis present

## 2021-11-20 DIAGNOSIS — M25562 Pain in left knee: Secondary | ICD-10-CM | POA: Diagnosis present

## 2021-11-20 DIAGNOSIS — E782 Mixed hyperlipidemia: Secondary | ICD-10-CM | POA: Diagnosis not present

## 2021-11-20 DIAGNOSIS — M4803 Spinal stenosis, cervicothoracic region: Secondary | ICD-10-CM | POA: Diagnosis present

## 2021-11-20 DIAGNOSIS — Z8673 Personal history of transient ischemic attack (TIA), and cerebral infarction without residual deficits: Secondary | ICD-10-CM

## 2021-11-20 DIAGNOSIS — T380X5A Adverse effect of glucocorticoids and synthetic analogues, initial encounter: Secondary | ICD-10-CM | POA: Diagnosis not present

## 2021-11-20 DIAGNOSIS — Z043 Encounter for examination and observation following other accident: Secondary | ICD-10-CM | POA: Diagnosis not present

## 2021-11-20 DIAGNOSIS — E871 Hypo-osmolality and hyponatremia: Secondary | ICD-10-CM | POA: Diagnosis not present

## 2021-11-20 DIAGNOSIS — M47814 Spondylosis without myelopathy or radiculopathy, thoracic region: Secondary | ICD-10-CM | POA: Diagnosis not present

## 2021-11-20 DIAGNOSIS — N1831 Chronic kidney disease, stage 3a: Secondary | ICD-10-CM | POA: Diagnosis not present

## 2021-11-20 DIAGNOSIS — Z79899 Other long term (current) drug therapy: Secondary | ICD-10-CM

## 2021-11-20 DIAGNOSIS — Z7401 Bed confinement status: Secondary | ICD-10-CM

## 2021-11-20 DIAGNOSIS — Z20822 Contact with and (suspected) exposure to covid-19: Secondary | ICD-10-CM | POA: Diagnosis not present

## 2021-11-20 DIAGNOSIS — G8220 Paraplegia, unspecified: Secondary | ICD-10-CM | POA: Diagnosis not present

## 2021-11-20 DIAGNOSIS — M4804 Spinal stenosis, thoracic region: Secondary | ICD-10-CM | POA: Diagnosis present

## 2021-11-20 DIAGNOSIS — K592 Neurogenic bowel, not elsewhere classified: Secondary | ICD-10-CM | POA: Diagnosis not present

## 2021-11-20 DIAGNOSIS — R200 Anesthesia of skin: Secondary | ICD-10-CM | POA: Diagnosis not present

## 2021-11-20 DIAGNOSIS — M479 Spondylosis, unspecified: Secondary | ICD-10-CM | POA: Diagnosis present

## 2021-11-20 DIAGNOSIS — R2689 Other abnormalities of gait and mobility: Secondary | ICD-10-CM | POA: Diagnosis present

## 2021-11-20 DIAGNOSIS — R338 Other retention of urine: Secondary | ICD-10-CM

## 2021-11-20 DIAGNOSIS — E1165 Type 2 diabetes mellitus with hyperglycemia: Secondary | ICD-10-CM | POA: Diagnosis not present

## 2021-11-20 LAB — COMPREHENSIVE METABOLIC PANEL
ALT: 27 U/L (ref 0–44)
AST: 28 U/L (ref 15–41)
Albumin: 3.3 g/dL — ABNORMAL LOW (ref 3.5–5.0)
Alkaline Phosphatase: 71 U/L (ref 38–126)
Anion gap: 10 (ref 5–15)
BUN: 14 mg/dL (ref 8–23)
CO2: 26 mmol/L (ref 22–32)
Calcium: 8.9 mg/dL (ref 8.9–10.3)
Chloride: 103 mmol/L (ref 98–111)
Creatinine, Ser: 1.02 mg/dL — ABNORMAL HIGH (ref 0.44–1.00)
GFR, Estimated: >60 mL/min (ref >60)
Glucose, Bld: 115 mg/dL — ABNORMAL HIGH (ref 70–99)
Potassium: 3.2 mmol/L — ABNORMAL LOW (ref 3.5–5.1)
Sodium: 139 mmol/L (ref 135–145)
Total Bilirubin: 0.6 mg/dL (ref 0.3–1.2)
Total Protein: 7.7 g/dL (ref 6.5–8.1)

## 2021-11-20 LAB — CBC WITH DIFFERENTIAL/PLATELET
Abs Immature Granulocytes: 0.03 10*3/uL (ref 0.00–0.07)
Basophils Absolute: 0 10*3/uL (ref 0.0–0.1)
Basophils Relative: 0 %
Eosinophils Absolute: 0 10*3/uL (ref 0.0–0.5)
Eosinophils Relative: 0 %
HCT: 33 % — ABNORMAL LOW (ref 36.0–46.0)
Hemoglobin: 9.5 g/dL — ABNORMAL LOW (ref 12.0–15.0)
Immature Granulocytes: 0 %
Lymphocytes Relative: 11 %
Lymphs Abs: 1.1 10*3/uL (ref 0.7–4.0)
MCH: 19.4 pg — ABNORMAL LOW (ref 26.0–34.0)
MCHC: 28.8 g/dL — ABNORMAL LOW (ref 30.0–36.0)
MCV: 67.5 fL — ABNORMAL LOW (ref 80.0–100.0)
Monocytes Absolute: 0.6 10*3/uL (ref 0.1–1.0)
Monocytes Relative: 6 %
Neutro Abs: 8 10*3/uL — ABNORMAL HIGH (ref 1.7–7.7)
Neutrophils Relative %: 83 %
Platelets: 427 10*3/uL — ABNORMAL HIGH (ref 150–400)
RBC: 4.89 MIL/uL (ref 3.87–5.11)
RDW: 22.1 % — ABNORMAL HIGH (ref 11.5–15.5)
WBC Morphology: REACTIVE
WBC: 9.7 10*3/uL (ref 4.0–10.5)
nRBC: 0 % (ref 0.0–0.2)

## 2021-11-20 LAB — URINALYSIS, ROUTINE W REFLEX MICROSCOPIC
Bilirubin Urine: NEGATIVE
Glucose, UA: NEGATIVE mg/dL
Hgb urine dipstick: NEGATIVE
Ketones, ur: NEGATIVE mg/dL
Leukocytes,Ua: NEGATIVE
Nitrite: NEGATIVE
Protein, ur: NEGATIVE mg/dL
Specific Gravity, Urine: 1.025 (ref 1.005–1.030)
pH: 6 (ref 5.0–8.0)

## 2021-11-20 LAB — LACTIC ACID, PLASMA: Lactic Acid, Venous: 1.1 mmol/L (ref 0.5–1.9)

## 2021-11-20 LAB — RESP PANEL BY RT-PCR (FLU A&B, COVID) ARPGX2
Influenza A by PCR: NEGATIVE
Influenza B by PCR: NEGATIVE
SARS Coronavirus 2 by RT PCR: NEGATIVE

## 2021-11-20 MED ORDER — HEPARIN SODIUM (PORCINE) 5000 UNIT/ML IJ SOLN
5000.0000 [IU] | Freq: Three times a day (TID) | INTRAMUSCULAR | Status: DC
  Administered 2021-11-20 – 2021-11-30 (×30): 5000 [IU] via SUBCUTANEOUS
  Filled 2021-11-20 (×30): qty 1

## 2021-11-20 MED ORDER — ACETAMINOPHEN 325 MG PO TABS
650.0000 mg | ORAL_TABLET | Freq: Four times a day (QID) | ORAL | Status: DC | PRN
  Administered 2021-11-22 – 2021-11-30 (×6): 650 mg via ORAL
  Filled 2021-11-20 (×7): qty 2

## 2021-11-20 MED ORDER — POTASSIUM CHLORIDE 20 MEQ PO PACK
40.0000 meq | PACK | Freq: Once | ORAL | Status: AC
  Administered 2021-11-20: 40 meq via ORAL
  Filled 2021-11-20: qty 2

## 2021-11-20 MED ORDER — LORAZEPAM 2 MG/ML IJ SOLN
1.0000 mg | Freq: Once | INTRAMUSCULAR | Status: AC
Start: 2021-11-20 — End: 2021-11-20
  Administered 2021-11-20: 1 mg via INTRAVENOUS
  Filled 2021-11-20: qty 1

## 2021-11-20 MED ORDER — ALPRAZOLAM 0.25 MG PO TABS
0.2500 mg | ORAL_TABLET | Freq: Two times a day (BID) | ORAL | Status: DC | PRN
  Administered 2021-11-23: 0.25 mg via ORAL
  Filled 2021-11-20: qty 1

## 2021-11-20 MED ORDER — DEXAMETHASONE SODIUM PHOSPHATE 10 MG/ML IJ SOLN
10.0000 mg | Freq: Once | INTRAMUSCULAR | Status: AC
Start: 2021-11-20 — End: 2021-11-20
  Administered 2021-11-20: 10 mg via INTRAVENOUS
  Filled 2021-11-20: qty 1

## 2021-11-20 MED ORDER — OXYCODONE HCL 5 MG PO TABS
5.0000 mg | ORAL_TABLET | Freq: Three times a day (TID) | ORAL | Status: DC | PRN
  Administered 2021-11-22 – 2021-11-30 (×18): 5 mg via ORAL
  Filled 2021-11-20 (×20): qty 1

## 2021-11-20 MED ORDER — ONDANSETRON HCL 4 MG PO TABS: 4.0000 mg | ORAL_TABLET | Freq: Four times a day (QID) | ORAL | Status: DC | PRN

## 2021-11-20 MED ORDER — DEXAMETHASONE SODIUM PHOSPHATE 4 MG/ML IJ SOLN
4.0000 mg | Freq: Four times a day (QID) | INTRAMUSCULAR | Status: DC
  Administered 2021-11-21 – 2021-11-23 (×11): 4 mg via INTRAVENOUS
  Filled 2021-11-20 (×11): qty 1

## 2021-11-20 MED ORDER — ASPIRIN 325 MG PO TABS
325.0000 mg | ORAL_TABLET | Freq: Every day | ORAL | Status: DC
  Administered 2021-11-21 – 2021-11-30 (×10): 325 mg via ORAL
  Filled 2021-11-20 (×10): qty 1

## 2021-11-20 MED ORDER — TRAZODONE HCL 50 MG PO TABS
150.0000 mg | ORAL_TABLET | Freq: Every day | ORAL | Status: DC
  Administered 2021-11-20 – 2021-11-29 (×10): 150 mg via ORAL
  Filled 2021-11-20 (×6): qty 1
  Filled 2021-11-20: qty 3
  Filled 2021-11-20 (×3): qty 1

## 2021-11-20 MED ORDER — LISINOPRIL 10 MG PO TABS: 20.0000 mg | ORAL_TABLET | Freq: Every day | ORAL | Status: DC

## 2021-11-20 MED ORDER — ACETAMINOPHEN 650 MG RE SUPP: 650.0000 mg | Freq: Four times a day (QID) | RECTAL | Status: DC | PRN

## 2021-11-20 MED ORDER — SIMVASTATIN 20 MG PO TABS
20.0000 mg | ORAL_TABLET | Freq: Every day | ORAL | Status: DC
  Administered 2021-11-20 – 2021-11-30 (×11): 20 mg via ORAL
  Filled 2021-11-20: qty 2
  Filled 2021-11-20 (×2): qty 1
  Filled 2021-11-20: qty 2
  Filled 2021-11-20 (×7): qty 1

## 2021-11-20 MED ORDER — LISINOPRIL 20 MG PO TABS
20.0000 mg | ORAL_TABLET | Freq: Every day | ORAL | Status: DC
  Administered 2021-11-21 – 2021-11-30 (×10): 20 mg via ORAL
  Filled 2021-11-20 (×3): qty 1
  Filled 2021-11-20: qty 2
  Filled 2021-11-20 (×6): qty 1

## 2021-11-20 MED ORDER — LORAZEPAM 2 MG/ML IJ SOLN
1.0000 mg | Freq: Once | INTRAMUSCULAR | Status: AC
  Administered 2021-11-20: 1 mg via INTRAVENOUS
  Filled 2021-11-20: qty 1

## 2021-11-20 MED ORDER — METOPROLOL TARTRATE 50 MG PO TABS
50.0000 mg | ORAL_TABLET | Freq: Two times a day (BID) | ORAL | Status: DC
  Administered 2021-11-20 – 2021-11-29 (×9): 50 mg via ORAL
  Filled 2021-11-20 (×17): qty 1

## 2021-11-20 MED ORDER — HYDRALAZINE HCL 25 MG PO TABS
25.0000 mg | ORAL_TABLET | Freq: Every day | ORAL | Status: DC
  Administered 2021-11-21 – 2021-11-23 (×3): 25 mg via ORAL
  Filled 2021-11-20 (×3): qty 1

## 2021-11-20 MED ORDER — ONDANSETRON HCL 4 MG/2ML IJ SOLN: 4.0000 mg | Freq: Four times a day (QID) | INTRAMUSCULAR | Status: DC | PRN

## 2021-11-20 NOTE — Assessment & Plan Note (Addendum)
-  CT head, MRI and MRA of brain -although limited due to motion showed no acute intracranial changes. 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  -Neurosurgery consulted and  following, no plan for surgical intervention for now. -Neurosurgery suspecting possible other neurological cause for paraplegia, so neurology consulted.  No further recommendation from neurology, low suspicion for transverse myelitis or any other neurological problems -PT/OT recommending CIR on  Discharge -Patient has mild improvement in the strength on bilateral lower extremities.  Weakness is more on the right lower extremity

## 2021-11-20 NOTE — Assessment & Plan Note (Addendum)
Continue statin 

## 2021-11-20 NOTE — Assessment & Plan Note (Addendum)
-  Diet controlled -Hemoglobin A1c found to be 6 -Continue sliding scale insulin

## 2021-11-20 NOTE — ED Notes (Signed)
Pt reports she has not urinated since yesterday and there is a possibility that she has a uit.

## 2021-11-20 NOTE — Assessment & Plan Note (Addendum)
-   Supplemented and corrected

## 2021-11-20 NOTE — Assessment & Plan Note (Addendum)
-  BP soft on current medications: Taking lisinopril, hydrochlorothiazide, metoprolol at home.  We will discontinue lisinopril -Heart healthy diet discussed with patient. -Monitor blood pressure.  Currently normotensive

## 2021-11-20 NOTE — Assessment & Plan Note (Addendum)
-   Being the main problem for patient's bilateral lower extremity weakness. -Continue treatment with steroids,start tapering -Patient  transferred to [facility] for further evaluation by neurosurgery. -MRI of lumbar, thoracic and cervical spine showed widespread degenerative spondylosis, canal and foraminal stenosis. At T3-T4,T4-T5. there is a disc bulge with osteophytic ridging with moderate to severe canal stenosis

## 2021-11-20 NOTE — ED Triage Notes (Signed)
Pt slid out of the bed today from sitting position and was unable to get up. Pt reports bilateral leg weakness x 1 weak.

## 2021-11-20 NOTE — ED Notes (Signed)
X-ray at bedside

## 2021-11-20 NOTE — ED Notes (Signed)
Attempted to call report to ICU, no answer, will try again later.

## 2021-11-20 NOTE — Assessment & Plan Note (Addendum)
-  Continue risk factor modifications -Continue the use of aspirin and statins.   -No new intracranial abnormalities appreciated on head images.   -Uses cane for ambulation

## 2021-11-20 NOTE — ED Provider Notes (Signed)
[facility] EMERGENCY DEPARTMENT Provider Note   CSN: [id number] Arrival date & time: 11/20/21  1215     History  Chief Complaint  Patient presents with   Fall   Extremity Weakness    Pt states she has bad knees.    Brandi [name] is a 66 y.o. female.  HPI  Patient with medical history including anemia, diabetes, hypertension, CVA, chronic bilateral knee pain presents to the emergency department complaints of leg weakness.  Patient is a difficult historian, but states that she is unable to feel her legs from the waist down, states that she cannot move them, states that they just feel numb, she denies any trauma to the area, states that she has been having this issue for last couple of days but is unclear of when it started, she denies any headaches, change in vision, paresthesia or weakness in the upper extremities, she does not endorse any fevers chills chest pain shortness of breath stomach pains nausea vomiting diarrhea, she does note that she has been unable to urinate over the last couple days she states she does not know why.   Patient's son was at bedside he informs that typically patient can move around with a walker, states though starting on Wednesday patient was unable to ambulate and has been bedbound ever since, he states that she is unable to get up and use the bathroom and brings a bucket for her to urinate.  He states that she will have episodes of severe knee inflammation where she is bedbound but he is never seen it to the point where she is unable to move her lower extremities, he is unaware of any recent falls, she has no history of back issues, states that she is eating and drinking like she normally does she has had no fevers no chills no chest pain or shortness of breath states that she is at her normal baseline, he does note that she will become very anxious and will become forgetful at times, this started after she had a stroke back in 2013.  Home  Medications Prior to Admission medications   Medication Sig Start Date End Date Taking? Authorizing Provider  albuterol (VENTOLIN HFA) 108 (90 Base) MCG/ACT inhaler Inhale into the lungs. 11/20/21  Yes [provider]  ALPRAZolam (XANAX) 0.25 MG tablet Take 0.25 mg by mouth 2 (two) times daily as needed. 10/18/21  Yes [provider]  aspirin 325 MG tablet Take 325 mg by mouth daily.   Yes [provider]  hydrALAZINE (APRESOLINE) 25 MG tablet Take 25 mg by mouth daily. 11/18/21  Yes [provider]  lisinopril (ZESTRIL) 20 MG tablet Take 20 mg by mouth daily.   Yes [provider]  metoprolol (LOPRESSOR) 100 MG tablet Take 0.5 tablets (50 mg total) by mouth 2 (two) times daily. 02/04/17  Yes Lucia Gaskins, MD  Oxycodone HCl 10 MG TABS Take 5 mg by mouth 4 (four) times daily. 02/20/17  Yes [provider]  simvastatin (ZOCOR) 20 MG tablet Take 20 mg by mouth daily.   Yes [provider]  traZODone (DESYREL) 150 MG tablet Take 150 mg by mouth at bedtime.   Yes [provider]  HYDROcodone-acetaminophen (NORCO/VICODIN) 5-325 MG tablet Take 1 tablet by mouth every 6 (six) hours as needed for moderate pain. Patient not taking: Reported on 02/22/2017 02/04/17   Lucia Gaskins, MD  traMADol (ULTRAM) 50 MG tablet Take 1 tablet (50 mg total) by mouth every 6 (six) hours  as needed. Patient not taking: Reported on 11/20/2021 06/30/18   Kem Parkinson, PA-C      Allergies    Codeine    Review of Systems   Review of Systems  Constitutional:  Negative for chills and fever.  Respiratory:  Negative for shortness of breath.   Cardiovascular:  Negative for chest pain.  Gastrointestinal:  Negative for abdominal pain, diarrhea, nausea and vomiting.  Musculoskeletal:  Negative for back pain and neck pain.       Bilateral leg numbness  Neurological:  Positive for numbness. Negative for weakness and headaches.   Physical Exam Updated Vital  Signs BP (!) 153/83    Pulse 76    Temp 98 F (36.7 C) (Oral)    Resp 15    Ht 5\' 6"  (1.676 m)    Wt 111 kg    SpO2 100%    BMI 39.50 kg/m  Physical Exam Vitals and nursing note reviewed. Exam conducted with a chaperone present.  Constitutional:      General: She is not in acute distress.    Appearance: She is obese. She is not ill-appearing.     Comments: Deconditioned state.  HENT:     Head: Normocephalic and atraumatic.     Comments: No d deformity of the head no raccoon eyes or battle sign noted.    Nose: No congestion.     Mouth/Throat:     Mouth: Mucous membranes are moist.     Pharynx: Oropharynx is clear. No oropharyngeal exudate or posterior oropharyngeal erythema.     Comments: No trismus no torticollis Eyes:     Extraocular Movements: Extraocular movements intact.     Conjunctiva/sclera: Conjunctivae normal.     Pupils: Pupils are equal, round, and reactive to light.  Cardiovascular:     Rate and Rhythm: Normal rate and regular rhythm.     Pulses: Normal pulses.     Heart sounds: No murmur heard.   No friction rub. No gallop.  Pulmonary:     Effort: No respiratory distress.     Breath sounds: No wheezing, rhonchi or rales.  Abdominal:     Palpations: Abdomen is soft.     Tenderness: There is no abdominal tenderness. There is no right CVA tenderness or left CVA tenderness.  Genitourinary:    Comments: Patient had good rectal tone, chaperone was present for exam. Musculoskeletal:     Comments: Patient has 4 out of 5 strength neurovascular intact full range of motion in the upper extremities.  Patient's lower extremity had good muscle tone will occasionally move her left leg at the hip flexor knee ankle and toes, she is unable to move her right leg at all it is stiff, she can move her toes but unable to move her ankle knee or hip flexor.  Back was palpated nontender to palpation no step-off or deformities noted no overlying skin changes.  Skin:    General: Skin is warm  and dry.  Neurological:     Mental Status: She is alert.     Comments: Patient is alert and oriented x4 but appears to have brief episodes of confusion, cranial nerves II through XII grossly intact occasionally have difficulty with word finding but no slurring of her words, unclear if there is communication or hearing difficulty but patient had a hard time completing finger-to-nose as she would touch her nose and then not touch my finger or touch her own finger or switch hands.  She eventually was able to complete  the tasks after a number of attempts.  Patient has 1+ reflexes in the patellas bilaterally    Psychiatric:        Mood and Affect: Mood normal.    ED Results / Procedures / Treatments   Labs (all labs ordered are listed, but only abnormal results are displayed) Labs Reviewed  COMPREHENSIVE METABOLIC PANEL - Abnormal; Notable for the following components:      Result Value   Potassium 3.2 (*)    Glucose, Bld 115 (*)    Creatinine, Ser 1.02 (*)    Albumin 3.3 (*)    All other components within normal limits  CBC WITH DIFFERENTIAL/PLATELET - Abnormal; Notable for the following components:   Hemoglobin 9.5 (*)    HCT 33.0 (*)    MCV 67.5 (*)    MCH 19.4 (*)    MCHC 28.8 (*)    RDW 22.1 (*)    Platelets 427 (*)    Neutro Abs 8.0 (*)    All other components within normal limits  RESP PANEL BY RT-PCR (FLU A&B, COVID) ARPGX2  URINALYSIS, ROUTINE W REFLEX MICROSCOPIC  LACTIC ACID, PLASMA  HIV ANTIBODY (ROUTINE TESTING W REFLEX)  COMPREHENSIVE METABOLIC PANEL  MAGNESIUM  CBC WITH DIFFERENTIAL/PLATELET  TSH  HEMOGLOBIN A1C    EKG None  Radiology CT Head Wo Contrast  Result Date: 11/20/2021 CLINICAL DATA:  Mental status change, unknown cause EXAM: CT HEAD WITHOUT CONTRAST TECHNIQUE: Contiguous axial images were obtained from the base of the skull through the vertex without intravenous contrast. RADIATION DOSE REDUCTION: This exam was performed according to the  departmental dose-optimization program which includes automated exposure control, adjustment of the mA and/or kV according to patient size and/or use of iterative reconstruction technique. COMPARISON:  None. FINDINGS: Brain: There is no acute intracranial hemorrhage, mass effect, or edema. Chronic infarct of the left parietotemporal lobes with associated volume loss. No hydrocephalus or extra-axial collection. Vascular: There is atherosclerotic calcification at the skull base. Skull: Calvarium is unremarkable. Sinuses/Orbits: No acute finding. Other: None. IMPRESSION: No acute intracranial abnormality. Chronic left parietotemporal infarct. Electronically Signed   By: Macy Mis M.D.   On: 11/20/2021 14:47   MR ANGIO HEAD WO CONTRAST  Result Date: 11/20/2021 CLINICAL DATA:  Mental status change, unknown cause EXAM: MRI HEAD WITHOUT CONTRAST TECHNIQUE: Multiplanar, multiecho pulse sequences of the brain and surrounding structures were obtained without intravenous contrast. COMPARISON:  Same day CT head.  MRI/MRA 06/24/2012. FINDINGS: Incomplete and limited exam due to patient intolerance/back pain. The following sequences were obtained: DWI/ADC, sagittal T1 axial T2 and limited gradient echo. The MRA was attempted; however, nondiagnostic. Within this limitation: MRI: Brain: Left temporoparietal encephalomalacia, new since 2013. 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 No evidence of acute infarct, acute hemorrhage mass lesion, midline shift, or hydrocephalus. Atrophy and ex vacuo ventricular dilation. Vascular: Major arterial flow voids are maintained at the skull base. Skull and upper cervical spine: Normal marrow signal. Degenerative changes in the visualized upper cervical spine. Sinuses/Orbits: Clear sinuses.  Unremarkable orbits. Other: No mastoid effusions. MRA: Nondiagnostic due to artifact. IMPRESSION: MRI: 1. Incomplete and limited study, as detailed above.  Within this limitation, no evidence of acute abnormality. 2. Chronic left temporoparietal encephalomalacia. MRA: Nondiagnostic study.  Consider CTA to further evaluate. Electronically Signed   By: Margaretha Sheffield M.D.   On: 11/20/2021 18:07   MR BRAIN WO CONTRAST  Result Date: 11/20/2021 CLINICAL DATA:  Mental status change, unknown cause EXAM:  MRI HEAD WITHOUT CONTRAST TECHNIQUE: Multiplanar, multiecho pulse sequences of the brain and surrounding structures were obtained without intravenous contrast. COMPARISON:  Same day CT head.  MRI/MRA 06/24/2012. FINDINGS: Incomplete and limited exam due to patient intolerance/back pain. The following sequences were obtained: DWI/ADC, sagittal T1 axial T2 and limited gradient echo. The MRA was attempted; however, nondiagnostic. Within this limitation: MRI: Brain: Left temporoparietal encephalomalacia, new since 2013. 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 No evidence of acute infarct, acute hemorrhage mass lesion, midline shift, or hydrocephalus. Atrophy and ex vacuo ventricular dilation. Vascular: Major arterial flow voids are maintained at the skull base. Skull and upper cervical spine: Normal marrow signal. Degenerative changes in the visualized upper cervical spine. Sinuses/Orbits: Clear sinuses.  Unremarkable orbits. Other: No mastoid effusions. MRA: Nondiagnostic due to artifact. IMPRESSION: MRI: 1. Incomplete and limited study, as detailed above. Within this limitation, no evidence of acute abnormality. 2. Chronic left temporoparietal encephalomalacia. MRA: Nondiagnostic study.  Consider CTA to further evaluate. Electronically Signed   By: Margaretha Sheffield M.D.   On: 11/20/2021 18:07   MR LUMBAR SPINE WO CONTRAST  Result Date: 11/20/2021 CLINICAL DATA:  Low back pain. EXAM: MRI LUMBAR SPINE WITHOUT CONTRAST TECHNIQUE: Multiplanar, multisequence MR imaging of the lumbar spine was performed. No intravenous contrast  was administered. COMPARISON:  None. FINDINGS: Segmentation:  Standard. Alignment: No static listhesis. Dextroscoliosis of the thoracolumbar spine. Vertebrae: No acute fracture, evidence of discitis, or aggressive bone lesion. Conus medullaris and cauda equina: Conus extends to the L1 level. Conus and cauda equina appear normal. Paraspinal and other soft tissues: No acute paraspinal abnormality. Disc levels: Disc spaces: Degenerative disease with disc height loss throughout the thoracolumbar spine. T10-11: Broad-based disc bulge. Moderate bilateral facet arthropathy. Mild spinal stenosis. Moderate bilateral foraminal stenosis. T11-12: Moderate bilateral facet arthropathy. Mild left foraminal stenosis. No central canal stenosis. T12-L1: Broad-based disc osteophyte complex eccentric towards the right. Severe bilateral facet arthropathy. No left foraminal stenosis. Moderate right foraminal stenosis. L1-L2: Broad-based disc bulge. Mild bilateral facet arthropathy. No foraminal or central canal stenosis. L2-L3: Mild broad-based disc bulge. Moderate bilateral facet arthropathy. Moderate spinal stenosis. Mild left foraminal stenosis. No right foraminal stenosis. L3-L4: Broad-based disc osteophyte complex. Severe bilateral facet arthropathy with ligamentum flavum infolding. Severe spinal stenosis. Severe left foraminal stenosis. Mild right foraminal stenosis. L4-L5: Broad-based disc osteophyte complex. Severe bilateral facet arthropathy with ligamentum flavum infolding. Severe right subarticular recess stenosis. Severe spinal stenosis. Mild left foraminal stenosis. Moderate-severe right foraminal stenosis. L5-S1: Mild broad-based disc bulge with a small left paracentral disc protrusion contacting the left intraspinal S1 nerve root. Severe right and moderate left facet arthropathy. Mild left foraminal stenosis. Severe right foraminal stenosis. IMPRESSION: 1. Diffuse lumbar spine spondylosis as described above. 2. Severe  spinal stenosis at L3-4 and L4-5. 3. No acute osseous injury of the lumbar spine. Electronically Signed   By: Kathreen Devoid M.D.   On: 11/20/2021 17:57   DG Chest Port 1 View  Result Date: 11/20/2021 CLINICAL DATA:  Fall. EXAM: PORTABLE CHEST 1 VIEW COMPARISON:  January 25, 2017. FINDINGS: The heart size and mediastinal contours are within normal limits. Both lungs are clear. The visualized skeletal structures are unremarkable. IMPRESSION: No active disease. Electronically Signed   By: Marijo Conception M.D.   On: 11/20/2021 14:39    Procedures Procedures    Medications Ordered in ED Medications  dexamethasone (DECADRON) injection 4 mg (has no administration in time range)  aspirin tablet 325  mg (325 mg Oral Not Given 11/20/21 2149)  oxyCODONE (Oxy IR/ROXICODONE) immediate release tablet 5 mg (has no administration in time range)  hydrALAZINE (APRESOLINE) tablet 25 mg (25 mg Oral Not Given 11/20/21 2150)  metoprolol tartrate (LOPRESSOR) tablet 50 mg (50 mg Oral Given 11/20/21 2205)  simvastatin (ZOCOR) tablet 20 mg (20 mg Oral Given 11/20/21 2205)  ALPRAZolam Duanne Moron) tablet 0.25 mg (has no administration in time range)  traZODone (DESYREL) tablet 150 mg (150 mg Oral Given 11/20/21 2205)  heparin injection 5,000 Units (5,000 Units Subcutaneous Given 11/20/21 2205)  acetaminophen (TYLENOL) tablet 650 mg (has no administration in time range)    Or  acetaminophen (TYLENOL) suppository 650 mg (has no administration in time range)  ondansetron (ZOFRAN) tablet 4 mg (has no administration in time range)    Or  ondansetron (ZOFRAN) injection 4 mg (has no administration in time range)  lisinopril (ZESTRIL) tablet 20 mg (has no administration in time range)  LORazepam (ATIVAN) injection 1 mg (1 mg Intravenous Given 11/20/21 1530)  dexamethasone (DECADRON) injection 10 mg (10 mg Intravenous Given 11/20/21 2051)  LORazepam (ATIVAN) injection 1 mg (1 mg Intravenous Given 11/20/21 2051)  potassium chloride (KLOR-CON) packet  40 mEq (40 mEq Oral Given 11/20/21 2051)    ED Course/ Medical Decision Making/ A&P                           Medical Decision Making Amount and/or Complexity of Data Reviewed Labs: ordered. Radiology: ordered.  Risk Prescription drug management. Decision regarding hospitalization.   This patient presents to the ED for concern of bilateral leg weakness, this involves an extensive number of treatment options, and is a complaint that carries with it a high risk of complications and morbidity.  The differential diagnosis includes limb ischemia, spinal equina, CVA, dissection metabolic abnormality    Additional history obtained:  Additional history obtained from son who is at bedside External records from outside source obtained and reviewed including please see HPI for further detail   Co morbidities that complicate the patient evaluation  CVA  Social Determinants of Health:  N/A    Lab Tests:  I Ordered, and personally interpreted labs.  The pertinent results include: CBC shows normocytic anemia hemoglobin 9.5 at baseline, CMP shows potassium of 3.2 glucose 115 creatinine 1.02 albumin 3.3, UA unremarkable respiratory panel unremarkable negative lactic   Imaging Studies ordered:  I ordered imaging studies including chest x-ray, CT head, MRI brain, MR lumbar spine, MR angio I independently visualized and interpreted imaging which showed chest x-ray as well as CT head both negative for acute findings, MR brain and MR angio of head and both were negative for acute findings were limited due to motion, MRI of lumbar spine shows stenosis of L L3-L5. I agree with the radiologist interpretation   Cardiac Monitoring:  The patient was maintained on a cardiac monitor.  I personally viewed and interpreted the cardiac monitored which showed an underlying rhythm of: EKG sinus rhythm not signs of ischemia  Reevaluation:  Patient was updated on initial imaging she has no complaints,  she is resting comfortably, vital signs remained stable, since CT head was unremarkable and there is no metabolic causes for her decrease leg movements will obtain MRI brain and lumbar spine for further evaluation  MRI does not show any significant findings, but patient still unable to move her right leg unclear etiology  will consult with neurosurgery for further recommendations as she  does have significant stenosis at L3-L5  Patient as well as son were updated on recommendations from neurosurgery they are agreement this plan will consult with hospitalist team for admission.  Consultations Obtained:  I requested consultation with Dr. Shelba Flake neurosurgery,  and discussed lab and imaging findings as well as pertinent plan - they recommend: She recommends hospital admission, transfer to [facility], started him on Decadron 10 mg and then 4 mg prednisone every 6hours.  she also recommends obtaining MRI thoracic spine for further evaluation. Spoke with Dr. Nori Riis with the admission team she will admit the patient.   Rule out low suspicion for internal head bleed and mass, or CVA as imaging exam for any acute findings.  Of note the MRI of the brain was distorted due to motion during the exam, patient's physical exam was inconsistent with CVA as weakness was only in her right lower leg and was just in her knee and hip, she had good equal strength in the upper extremities, no facial asymmetry, speech, mental status is at baseline per son who is at bedside.  Low suspicion for dissection of the vertebral or carotid artery as presentation atypical of etiology.  Low suspicion for meningitis as she has no meningeal sign present.  Low session for spinal equina as MRI is negative for these findings.  Low suspicion for spinal cord abscess as she has no overlying skin changes, she is afebrile nontachycardic no tenderness along her spine.  Low decision for metabolic abnormality as her lab work is  unremarkable.     Dispostion and problem list  After consideration of the diagnostic results and the patients response to treatment, I feel that the patent would benefit from transfer to [facility].  Leg weakness-unclear etiology possibly acute on chronic lower back pain will need further evaluation by the neurosurgery team.            Final Clinical Impression(s) / ED Diagnoses Final diagnoses:  Bilateral leg weakness    Rx / DC Orders ED Discharge Orders     None         Marcello Fennel, PA-C 11/20/21 2222    Milton Ferguson, MD 11/21/21 [phone]

## 2021-11-20 NOTE — H&P (Signed)
History and Physical    Patient: Brandi [name] UXL:[id number] DOB: 1956/04/06 DOA: 11/20/2021 DOS: the patient was seen and examined on 11/20/2021 PCP: [name], MD  Patient coming from: Home  Chief Complaint:  Chief Complaint  Patient presents with   Fall   Extremity Weakness    Pt states she has bad knees.    HPI: Brandi [name] is a 66 y.o. female with medical history significant of with history of diabetes mellitus type 2, hypertension, hyperlipidemia, stroke, presents the ED with a chief complaint of bilateral leg weakness.  Patient reports that started 3 days ago.  Patient is an excessively poor historian.  She reports the weakness was gradual in onset.  She has bilateral chronic knee pain but reports no new pain.  At one point she reports she has no sensation in her legs, and another report she says she can feel hot and cold, when asked which 1 is accurate she just says yes.  She does think the right leg is weaker than the left.  She has never had this happen to her before.  During these 3 days she reports no headache, blurry vision, dysphagia, weakness in her arms.  She does have chronic expressive speech problems from her previous stroke.  Patient reports lower back pain.  She cannot describe it because is not hurting now.  She reports its been present for a long time because of the job that she had when she was younger.  She does not think this pain is changed.  She reports a normal appetite, no nausea vomiting or diarrhea.  No chest pain, no palpitations, no shortness of breath.  Patient has no other complaints at this time.  Patient reports that she is a former smoker, does not drink alcohol, does not use illicit drugs, is vaccinated for COVID.  Patient is full code.  Review of Systems: As mentioned in the history of present illness. All other systems reviewed and are negative. Past Medical History:  Diagnosis Date   Anemia    Diabetes mellitus without complication ([place])     Hypertension    Stroke ([place]) 2013   Past Surgical History:  Procedure Laterality Date   BOWEL RESECTION N/A 02/01/2017   Procedure: PARTIAL SMALL BOWEL RESECTION;  Surgeon: Aviva Signs, MD;  Location: [facility];  Service: General;  Laterality: N/A;   COLONOSCOPY WITH PROPOFOL N/A 01/26/2017   Procedure: COLONOSCOPY WITH PROPOFOL;  Surgeon: Daneil Dolin, MD;  Location: [facility];  Service: Endoscopy;  Laterality: N/A;   cyst removed from right breast     ESOPHAGOGASTRODUODENOSCOPY (EGD) WITH PROPOFOL N/A 01/26/2017   Procedure: ESOPHAGOGASTRODUODENOSCOPY (EGD) WITH PROPOFOL;  Surgeon: Daneil Dolin, MD;  Location: [facility];  Service: Endoscopy;  Laterality: N/A;   GIVENS CAPSULE STUDY N/A 01/27/2017   Procedure: GIVENS CAPSULE STUDY;  Surgeon: Daneil Dolin, MD;  Location: [facility];  Service: Endoscopy;  Laterality: N/A;   KNEE SURGERY  right knee   LAPAROTOMY N/A 02/01/2017   Procedure: EXPLORATORY LAPAROTOMY;  Surgeon: Aviva Signs, MD;  Location: [facility];  Service: General;  Laterality: N/A;   POLYPECTOMY  01/26/2017   Procedure: POLYPECTOMY;  Surgeon: Daneil Dolin, MD;  Location: [facility];  Service: Endoscopy;;  sigmoid and rectal   Social History:  reports that she quit smoking about 10 years ago. Her smoking use included cigarettes. She has a 25.00 pack-year smoking history. She has never used smokeless tobacco. She reports that  she does not drink alcohol and does not use drugs.  Allergies  Allergen Reactions   Codeine     Unknown     Family History  Problem Relation Age of Onset   Heart attack Father    Ulcers Mother        stomach    Prior to Admission medications   Medication Sig Start Date End Date Taking? Authorizing Provider  albuterol (VENTOLIN HFA) 108 (90 Base) MCG/ACT inhaler Inhale into the lungs. 11/20/21  Yes [provider]  ALPRAZolam (XANAX) 0.25 MG tablet Take 0.25 mg by mouth 2 (two) times daily as needed. 10/18/21  Yes [provider]  aspirin 325 MG tablet Take 325 mg by mouth daily.   Yes [provider]  hydrALAZINE (APRESOLINE) 25 MG tablet Take 25 mg by mouth daily. 11/18/21  Yes [provider]  lisinopril (ZESTRIL) 20 MG tablet Take 20 mg by mouth daily.   Yes [provider]  metoprolol (LOPRESSOR) 100 MG tablet Take 0.5 tablets (50 mg total) by mouth 2 (two) times daily. 02/04/17  Yes Lucia Gaskins, MD  Oxycodone HCl 10 MG TABS Take 5 mg by mouth 4 (four) times daily. 02/20/17  Yes [provider]  simvastatin (ZOCOR) 20 MG tablet Take 20 mg by mouth daily.   Yes [provider]  traZODone (DESYREL) 150 MG tablet Take 150 mg by mouth at bedtime.   Yes [provider]  HYDROcodone-acetaminophen (NORCO/VICODIN) 5-325 MG tablet Take 1 tablet by mouth every 6 (six) hours as needed for moderate pain. Patient not taking: Reported on 02/22/2017 02/04/17   Lucia Gaskins, MD  traMADol (ULTRAM) 50 MG tablet Take 1 tablet (50 mg total) by mouth every 6 (six) hours as needed. Patient not taking: Reported on 11/20/2021 06/30/18   Kem Parkinson, PA-C    Physical Exam: Vitals:   11/20/21 1515 11/20/21 1609 11/20/21 1842 11/20/21 1930  BP:   (!) 151/79 (!) 148/96  Pulse: 96 95 82 88  Resp: 18 (!) 24 16 15   Temp:      TempSrc:   Oral   SpO2: 98% 99% 100% 96%  Weight:      Height:       1.  General: Patient lying supine in bed,  no acute distress   2. Psychiatric: Alert and oriented x 3, mood and behavior normal for situation, pleasant and cooperative with exam   3. Neurologic: Patient has trouble with word finding, and occasionally stutters, face is symmetric, right arm is subtly weak compared to the left.  During her previous providers exam patient could move her legs.  During my exam there is either poor effort or she is unable to move any part of her leg.  She does have sensation in her entire left leg and cannot discern any hot or cold touch, has no  sensation in her lower right leg, but does feel touch above the knee.  Patient is unable to comprehend finger-nose test.    4. HEENMT:  Head is atraumatic, normocephalic, pupils reactive to light, neck is supple, trachea is midline, mucous membranes are moist hirsutism present   5. Respiratory : Lungs are clear to auscultation bilaterally without wheezing, rhonchi, rales, no cyanosis, no increase in work of breathing or accessory muscle use   6. Cardiovascular : Heart rate normal, rhythm is regular, no murmurs, rubs or gallops, no peripheral edema, peripheral pulses palpated   7. Gastrointestinal:  Abdomen is soft, nondistended, nontender to palpation bowel  sounds active, no masses or organomegaly palpated   8. Skin:  Skin is warm, dry and intact without rashes, acute lesions, or ulcers on limited exam   9.Musculoskeletal:  No acute deformities or trauma, no asymmetry in tone, no peripheral edema, peripheral pulses palpated, no tenderness to palpation in the extremities   Data Reviewed: In the ED Patient is afebrile, heart rate ranges 54-1 06, respiratory rate ranges 15-26, blood pressure ranges 148/96-173/93, satting 98% No leukocytosis, hemoglobin stable Hypokalemia 3.2 Mild protein calorie malnutrition with an albumin of 3.3 Negative respiratory panel EKG shows sinus rhythm, heart rate 96, QTc 471 MRI lumbar spine shows diffuse lumbar spine spondylosis, severe spinal stenosis at L3/4 and L4/5 MRI/A brain shows an incomplete study, but chronic left temporoparietal encephalomalacia CT -no acute intracranial abnormality, chronic left parietal temporal infarct ER provider spoke with neurosurgery who recommended starting Decadron and continuing 4 mg IV every 6 hours, admit to [facility] for neurosurgery evaluation  Assessment and Plan: * Leg weakness, bilateral- (present on admission) CT head shows no acute changes MRI and MRA of brain -although limited due to motion -showed no  acute changes MRI lumbar spine shows spondylosis and severe stenosis Neurosurgery recommends admission to [facility], evaluation by neurosurgery, Decadron 4 mg every 6 hours Continue to monitor  Lumbar stenosis- (present on admission) Top differential for the etiology of the leg weakness See plan above  Hypokalemia- (present on admission) Replace Recheck in the a.m. Check magnesium in the a.m. as well  History of CVA (cerebrovascular accident) Continue blood pressure control, aspirin, statin Residual effects are subtle right upper extremity weakness and subtle expressive speech deficits  Mixed hyperlipidemia- (present on admission) Continue statin  DM type 2 (diabetes mellitus, type 2) (HCC) Diet controlled at this time Glucose in the ED was 115 Last hemoglobin A1c was 5.7 -but was years ago Update hemoglobin A1c Continue to monitor glucose with daily BMPs  HTN (hypertension)- (present on admission) Continue hydralazine, lisinopril, metoprolol Continue to monitor       Advance Care Planning:   Code Status: Prior full  Consults: Neurosurgery  Family Communication: No family at bedside  Severity of Illness: The appropriate patient status for this patient is OBSERVATION. Observation status is judged to be reasonable and necessary in order to provide the required intensity of service to ensure the patient's safety. The patient's presenting symptoms, physical exam findings, and initial radiographic and laboratory data in the context of their medical condition is felt to place them at decreased risk for further clinical deterioration. Furthermore, it is anticipated that the patient will be medically stable for discharge from the hospital within 2 midnights of admission.   Author: Rolla Plate, DO 11/20/2021 9:25 PM  For on call review [email or web address].

## 2021-11-21 ENCOUNTER — Inpatient Hospital Stay (HOSPITAL_COMMUNITY): Payer: Medicare Other

## 2021-11-21 DIAGNOSIS — I10 Essential (primary) hypertension: Secondary | ICD-10-CM | POA: Diagnosis not present

## 2021-11-21 DIAGNOSIS — E1165 Type 2 diabetes mellitus with hyperglycemia: Secondary | ICD-10-CM | POA: Diagnosis not present

## 2021-11-21 DIAGNOSIS — M48061 Spinal stenosis, lumbar region without neurogenic claudication: Secondary | ICD-10-CM

## 2021-11-21 DIAGNOSIS — E782 Mixed hyperlipidemia: Secondary | ICD-10-CM

## 2021-11-21 DIAGNOSIS — Z8673 Personal history of transient ischemic attack (TIA), and cerebral infarction without residual deficits: Secondary | ICD-10-CM | POA: Diagnosis not present

## 2021-11-21 DIAGNOSIS — E876 Hypokalemia: Secondary | ICD-10-CM

## 2021-11-21 DIAGNOSIS — R29898 Other symptoms and signs involving the musculoskeletal system: Secondary | ICD-10-CM | POA: Diagnosis not present

## 2021-11-21 LAB — CBC WITH DIFFERENTIAL/PLATELET
Abs Immature Granulocytes: 0.03 10*3/uL (ref 0.00–0.07)
Basophils Absolute: 0 10*3/uL (ref 0.0–0.1)
Basophils Relative: 0 %
Eosinophils Absolute: 0 10*3/uL (ref 0.0–0.5)
Eosinophils Relative: 0 %
HCT: 31.7 % — ABNORMAL LOW (ref 36.0–46.0)
Hemoglobin: 8.7 g/dL — ABNORMAL LOW (ref 12.0–15.0)
Immature Granulocytes: 1 %
Lymphocytes Relative: 11 %
Lymphs Abs: 0.6 10*3/uL — ABNORMAL LOW (ref 0.7–4.0)
MCH: 18.6 pg — ABNORMAL LOW (ref 26.0–34.0)
MCHC: 27.4 g/dL — ABNORMAL LOW (ref 30.0–36.0)
MCV: 67.6 fL — ABNORMAL LOW (ref 80.0–100.0)
Monocytes Absolute: 0.1 10*3/uL (ref 0.1–1.0)
Monocytes Relative: 1 %
Neutro Abs: 5 10*3/uL (ref 1.7–7.7)
Neutrophils Relative %: 87 %
Platelets: 475 10*3/uL — ABNORMAL HIGH (ref 150–400)
RBC: 4.69 MIL/uL (ref 3.87–5.11)
RDW: 21.2 % — ABNORMAL HIGH (ref 11.5–15.5)
WBC: 5.7 10*3/uL (ref 4.0–10.5)
nRBC: 0 % (ref 0.0–0.2)

## 2021-11-21 LAB — COMPREHENSIVE METABOLIC PANEL
ALT: 23 U/L (ref 0–44)
AST: 19 U/L (ref 15–41)
Albumin: 3 g/dL — ABNORMAL LOW (ref 3.5–5.0)
Alkaline Phosphatase: 64 U/L (ref 38–126)
Anion gap: 8 (ref 5–15)
BUN: 13 mg/dL (ref 8–23)
CO2: 24 mmol/L (ref 22–32)
Calcium: 8.8 mg/dL — ABNORMAL LOW (ref 8.9–10.3)
Chloride: 108 mmol/L (ref 98–111)
Creatinine, Ser: 0.85 mg/dL (ref 0.44–1.00)
GFR, Estimated: >60 mL/min (ref >60)
Glucose, Bld: 199 mg/dL — ABNORMAL HIGH (ref 70–99)
Potassium: 3.8 mmol/L (ref 3.5–5.1)
Sodium: 140 mmol/L (ref 135–145)
Total Bilirubin: 0.6 mg/dL (ref 0.3–1.2)
Total Protein: 7.1 g/dL (ref 6.5–8.1)

## 2021-11-21 LAB — URINALYSIS, ROUTINE W REFLEX MICROSCOPIC
Bilirubin Urine: NEGATIVE
Glucose, UA: NEGATIVE mg/dL
Hgb urine dipstick: NEGATIVE
Ketones, ur: NEGATIVE mg/dL
Leukocytes,Ua: NEGATIVE
Nitrite: NEGATIVE
Protein, ur: 30 mg/dL — AB
Specific Gravity, Urine: 1.023 (ref 1.005–1.030)
pH: 5 (ref 5.0–8.0)

## 2021-11-21 LAB — MAGNESIUM: Magnesium: 2 mg/dL (ref 1.7–2.4)

## 2021-11-21 LAB — HIV ANTIBODY (ROUTINE TESTING W REFLEX): HIV Screen 4th Generation wRfx: NONREACTIVE

## 2021-11-21 LAB — CBG MONITORING, ED: Glucose-Capillary: 147 mg/dL — ABNORMAL HIGH (ref 70–99)

## 2021-11-21 LAB — TSH: TSH: 0.225 u[IU]/mL — ABNORMAL LOW (ref 0.350–4.500)

## 2021-11-21 MED ORDER — CHLORHEXIDINE GLUCONATE CLOTH 2 % EX PADS: 6.0000 | MEDICATED_PAD | Freq: Every day | CUTANEOUS | Status: DC

## 2021-11-21 NOTE — ED Notes (Signed)
Pt resting. Nad.denies any needs at this time. Aware awaiting bed assignment for admission.

## 2021-11-21 NOTE — Progress Notes (Signed)
Progress Note   Patient: Brandi [name] LFY:[id number] DOB: 1956-02-02 DOA: 11/20/2021     1 DOS: the patient was seen and examined on 11/21/2021   Brief hospital admission narrative: HETAL PROANO is a 66 y.o. female with medical history significant of with history of diabetes mellitus type 2, hypertension, hyperlipidemia, stroke, presents the ED with a chief complaint of bilateral leg weakness.  Patient reports that started 3 days ago.  Patient is an excessively poor historian.  She reports the weakness was gradual in onset.  She has bilateral chronic knee pain but reports no new pain.  At one point she reports she has no sensation in her legs, and another report she says she can feel hot and cold, when asked which 1 is accurate she just says yes.  She does think the right leg is weaker than the left.  She has never had this happen to her before.  During these 3 days she reports no headache, blurry vision, dysphagia, weakness in her arms.  She does have chronic expressive speech problems from her previous stroke.  Patient reports lower back pain.  She cannot describe it because is not hurting now.  She reports its been present for a long time because of the job that she had when she was younger.  She does not think this pain is changed.  She reports a normal appetite, no nausea vomiting or diarrhea.  No chest pain, no palpitations, no shortness of breath.  Patient has no other complaints at this time.   Patient reports that she is a former smoker, does not drink alcohol, does not use illicit drugs, is vaccinated for COVID.  Patient is full code.   Assessment and Plan: * Leg weakness, bilateral- (present on admission) -CT head, MRI and MRA of brain -although limited due to motion showed no acute intracranial changes. -MRI lumbar spine shows spondylosis and severe stenosis. -Neurosurgery consulted has recommended transfer to [facility] for further evaluation by neurosurgery  service. -Patient has been started on Decadron 4 mg every 6 hours. -Continue supportive care.  -Physical therapy evaluation requested.   History of CVA (cerebrovascular accident) -Continue risk factor modifications -Continue the use of aspirin and statins.   -No new intracranial abnormalities appreciated on head images.    Lumbar stenosis- (present on admission) - Being the main problem for patient's bilateral lower extremity weakness. -Continue treatment with steroids -Patient to be transferred to [facility] for further evaluation by neurosurgery. -Will follow recommendations.   -Continue supportive care.  Hypokalemia- (present on admission) -Continue to follow electrolytes and further replete as needed. -Magnesium within normal limits -Advised to maintain adequate hydration.   -Potassium at time of admission 3.2; currently K 3.8  Mixed hyperlipidemia- (present on admission) -Continue statin -Heart healthy diet discussed with patient.  DM type 2 (diabetes mellitus, type 2) ([place]) -Patient currently not taking medications for diabetes; diet controlled. -Anticipating some elevations in her CBGs with the use of his steroids. -Follow CBGs and adjust hypoglycemic regimen as needed. -Last hemoglobin A1c over 23-month ago 5.7; follow repeat A1c level for update. -Continue modified carbohydrate diet.  HTN (hypertension)- (present on admission) -Continue hydralazine, lisinopril and metoprolol -Heart healthy diet discussed with patient.   Subjective:  Afebrile, no chest pain, no nausea, no vomiting.  Reports feeling weak in her legs and being tired.  Hemodynamically stable.  Physical Exam: Vitals:   11/21/21 0700 11/21/21 0730 11/21/21 0800 11/21/21 0830  BP: (!) 169/87 (!) 146/132 Marland Kitchen)  163/84 (!) 165/92  Pulse:  70 66 72  Resp:  19 20 20   Temp:      TempSrc:      SpO2:  99% 98% 99%  Weight:      Height:       General exam: In no major distress; denies chest pain, no  nausea, no vomiting. Respiratory system: Clear to auscultation. Respiratory effort normal. Cardiovascular system: Rate controlled, no rubs, no gallops, no JVD on exam. Gastrointestinal system: Abdomen is nondistended, soft and nontender. No organomegaly or masses felt. Normal bowel sounds heard. Central nervous system: No new focal deficits.  Patient with some residual right-sided weakness from previous stroke.  Unable to move her legs bilaterally. Extremities: No cyanosis or clubbing.  No edema appreciated. Skin: No petechiae. Psychiatry: Judgement and insight appear normal.  Stable mood.  Flat affect.   Data Reviewed: The metabolic panel demonstrating potassium of 3.8; creatinine 0.85 and blood sugar 199. Lactic acid 1.1 Hemoglobin 9.5, normal platelet counts and normal WBCs. Urinalysis without nitrites or leukocyte esterase.  No suggesting infection.  Family Communication: No family at bedside.  Disposition: Status is: Inpatient Remains inpatient appropriate because: Continue IV use of Decadron and follow recommendations from neurosurgery service.  Planned Discharge Destination: With plans for hopefully discharge home after hospitalization; will see clinical response and progression to further determine.   Author: Barton Dubois, MD 11/21/2021 9:06 AM  For on call review [email or web address].

## 2021-11-21 NOTE — ED Notes (Signed)
Pt son at bedside, pt given clean towels and basin to bathe. Pt states she has her own soap and can bathe herself and her son can assist her if needed.

## 2021-11-21 NOTE — ED Notes (Signed)
[facility] called and requested her name be put onto pt account for information. Pt verbalized she does not want her to get information. Only her son is allowed on chart.

## 2021-11-21 NOTE — ED Notes (Signed)
Per NT, bladder scanner showed 871ml. Dr Dyann Kief aware and indwelling foley cath ordered.

## 2021-11-21 NOTE — Plan of Care (Signed)
Patient received from ED, A&Ox4, no acute distress noticed, denise SOB or CP. Will continue to monitor

## 2021-11-22 LAB — CBC WITH DIFFERENTIAL/PLATELET
Abs Immature Granulocytes: 0.09 10*3/uL — ABNORMAL HIGH (ref 0.00–0.07)
Basophils Absolute: 0 10*3/uL (ref 0.0–0.1)
Basophils Relative: 0 %
Eosinophils Absolute: 0 10*3/uL (ref 0.0–0.5)
Eosinophils Relative: 0 %
HCT: 30.3 % — ABNORMAL LOW (ref 36.0–46.0)
Hemoglobin: 8.8 g/dL — ABNORMAL LOW (ref 12.0–15.0)
Immature Granulocytes: 1 %
Lymphocytes Relative: 11 %
Lymphs Abs: 1.1 10*3/uL (ref 0.7–4.0)
MCH: 19 pg — ABNORMAL LOW (ref 26.0–34.0)
MCHC: 29 g/dL — ABNORMAL LOW (ref 30.0–36.0)
MCV: 65.4 fL — ABNORMAL LOW (ref 80.0–100.0)
Monocytes Absolute: 0.3 10*3/uL (ref 0.1–1.0)
Monocytes Relative: 3 %
Neutro Abs: 8.7 10*3/uL — ABNORMAL HIGH (ref 1.7–7.7)
Neutrophils Relative %: 85 %
Platelets: 463 10*3/uL — ABNORMAL HIGH (ref 150–400)
RBC: 4.63 MIL/uL (ref 3.87–5.11)
RDW: 21.4 % — ABNORMAL HIGH (ref 11.5–15.5)
Smear Review: INCREASED
WBC: 10.3 10*3/uL (ref 4.0–10.5)
nRBC: 0 % (ref 0.0–0.2)

## 2021-11-22 LAB — HEMOGLOBIN A1C
Hgb A1c MFr Bld: 6 % — ABNORMAL HIGH (ref 4.8–5.6)
Mean Plasma Glucose: 126 mg/dL

## 2021-11-22 LAB — FERRITIN: Ferritin: 18 ng/mL (ref 11–307)

## 2021-11-22 LAB — IRON AND TIBC
Iron: 15 ug/dL — ABNORMAL LOW (ref 28–170)
Saturation Ratios: 5 % — ABNORMAL LOW (ref 10.4–31.8)
TIBC: 305 ug/dL (ref 250–450)
UIBC: 290 ug/dL

## 2021-11-22 LAB — GLUCOSE, CAPILLARY
Glucose-Capillary: 159 mg/dL — ABNORMAL HIGH (ref 70–99)
Glucose-Capillary: 156 mg/dL — ABNORMAL HIGH (ref 70–99)
Glucose-Capillary: 213 mg/dL — ABNORMAL HIGH (ref 70–99)
Glucose-Capillary: 216 mg/dL — ABNORMAL HIGH (ref 70–99)

## 2021-11-22 MED ORDER — POLYETHYLENE GLYCOL 3350 17 G PO PACK
17.0000 g | PACK | Freq: Every day | ORAL | Status: DC
  Administered 2021-11-22 – 2021-11-30 (×7): 17 g via ORAL
  Filled 2021-11-22 (×8): qty 1

## 2021-11-22 MED ORDER — PANTOPRAZOLE SODIUM 40 MG PO TBEC
40.0000 mg | DELAYED_RELEASE_TABLET | Freq: Every day | ORAL | Status: DC
  Administered 2021-11-22 – 2021-11-30 (×9): 40 mg via ORAL
  Filled 2021-11-22 (×9): qty 1

## 2021-11-22 MED ORDER — SENNA 8.6 MG PO TABS
1.0000 | ORAL_TABLET | Freq: Two times a day (BID) | ORAL | Status: DC
  Administered 2021-11-22 – 2021-11-30 (×15): 8.6 mg via ORAL
  Filled 2021-11-22 (×16): qty 1

## 2021-11-22 MED ORDER — SODIUM CHLORIDE 0.9 % IV SOLN: INTRAVENOUS | Status: DC | PRN

## 2021-11-22 MED ORDER — SODIUM CHLORIDE 0.9 % IV SOLN
510.0000 mg | Freq: Once | INTRAVENOUS | Status: AC
  Administered 2021-11-22: 510 mg via INTRAVENOUS
  Filled 2021-11-22: qty 17

## 2021-11-22 MED ORDER — FERROUS SULFATE 325 (65 FE) MG PO TABS
325.0000 mg | ORAL_TABLET | Freq: Every day | ORAL | Status: DC
  Administered 2021-11-23 – 2021-11-30 (×8): 325 mg via ORAL
  Filled 2021-11-22 (×8): qty 1

## 2021-11-22 NOTE — Progress Notes (Signed)
Progress Note   Patient: Brandi [name] DOB: 1955-10-29 DOA: 11/20/2021     2 DOS: the patient was seen and examined on 11/22/2021   Brief hospital course: Brandi [name] is a 66 y.o. female with medical history significant of with history of diabetes mellitus type 2, hypertension, hyperlipidemia, stroke, presents the ED with a chief complaint of bilateral leg weakness.  Work-up MRI lumbar spine showed spondylosis and severe stenosis.  Neurology was consulted and was recommended to transfer to [facility] for further evaluation by neurosurgery service.  Started on Decadron.  Neurosurgery consulted today.  Assessment and Plan: * Leg weakness, bilateral- (present on admission) -CT head, MRI and MRA of brain -although limited due to motion showed no acute intracranial changes. -MRI lumbar spine shows spondylosis and severe stenosis. -Neurosurgery consulted has recommended transfer to [facility] for further evaluation by neurosurgery service. -Patient has been started on Decadron 4 mg every 6 hours. -Continue supportive care.  -Neurosurgery consulted today.  Discussed with Dr. Christella Noa on 2/8 -Physical therapy evaluation requested.   Lumbar stenosis- (present on admission) - Being the main problem for patient's bilateral lower extremity weakness. -Continue treatment with steroids -Patient  transferred to [facility] for further evaluation by neurosurgery. -Will follow recommendations.   -Continue supportive care.  Obesity- (present on admission) BMI of 39.5  History of CVA (cerebrovascular accident) -Continue risk factor modifications -Continue the use of aspirin and statins.   -No new intracranial abnormalities appreciated on head images.   -Uses cane for ambulation  Hypokalemia- (present on admission) - Supplemented and corrected  Mixed hyperlipidemia- (present on admission) -Continue statin -Heart healthy diet discussed with patient.  DM type 2  (diabetes mellitus, type 2) ([place]) -Patient currently not taking medications for diabetes -Hemoglobin A1c found to be 8.7. -Patient needs to be started on antidiabetic medications on discharge. -Continue current insulin regimen for now  Microcytic anemia- (present on admission) - Hemoglobin in the range of 8.  Iron studies showed severe iron deficiency. -We will supplement him with IV iron.  Patient also needs to be continued on oral iron therapy -No report of hematochezia or melena. 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  At that time colonoscopy and EGD were normal -She has chronic iron-deficiency anemia and her baseline hemoglobin is in the range of 8  HTN (hypertension)- (present on admission) -Continue hydralazine, lisinopril and metoprolol -Heart healthy diet discussed with patient. -Monitor blood pressure.  Currently normotensive        Subjective:  Patient seen and examined at the bedside this morning.  Hemodynamically stable.  Lying in bed.  Family at bedside.  Continues to have weakness in bilateral lower extremities though she says she has slightly improved.  She is unable to lift her legs.  Denies any back pain  Physical Exam: Vitals:   11/21/21 2012 11/22/21 0008 11/22/21 0349 11/22/21 0901  BP: (!) 157/82 (!) 143/67 133/65 (!) 136/92  Pulse: 72 62 (!) 58 63  Resp: 18 17 17 18   Temp: 98.5 F (36.9 C) 97.9 F (36.6 C) 97.8 F (36.6 C)   TempSrc: Oral Oral Oral   SpO2: 100% 100% 95% 97%  Weight:      Height:         Data Reviewed:    Family Communication: Family present at the bedside  Disposition: Home versus skilled facility Status is: Inpatient    Planned Discharge Destination:  Home versus skilled nursing facility       Author: Shelly Coss, MD 11/22/2021 10:09 AM  For on call review [email or web address].

## 2021-11-22 NOTE — Evaluation (Signed)
Occupational Therapy Evaluation Patient Details Name: Brandi [name] MRN: [id number] DOB: Apr 17, 1956 Today's Date: 11/22/2021   History of Present Illness 66 y/o female presented to ED on 11/20/21 of bilateral LE weakness x 1 week and fall out of bed. CT head and MRI negative. 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 PMH: CVA, HTN, Diabetes   Clinical Impression   Brandi [name] was evaluated s/p the above admission list. PTA she was generally mod I with use of SPC. She lives in a 1 level home, 10 STE with her son who can assist 24/7 at d/c. Upon evaluation pt is limited by generalized weakness, BLE weakness and impaired cognition (baseline?). Overall she required max A for bed mobility with cues for sequencing. Once sitting EOB she has good balance. Pt will benefit from OT acutely to address impairments listed below. Pt is highly motivated to participate therefore recommending AIR at d/c to progress towards mod I baseline.       Recommendations for follow up therapy are one component of a multi-disciplinary discharge planning process, led by the attending physician.  Recommendations may be updated based on patient status, additional functional criteria and insurance authorization.   Follow Up Recommendations  Acute inpatient rehab (3hours/day)    Assistance Recommended at Discharge Frequent or constant Supervision/Assistance  Patient can return home with the following A lot of help with walking and/or transfers;A lot of help with bathing/dressing/bathroom;Assistance with cooking/housework;Assist for transportation;Help with stairs or ramp for entrance    Functional Status Assessment  Patient has had a recent decline in their functional status and demonstrates the ability to make significant improvements in function in a reasonable and predictable amount of time.  Equipment Recommendations  Wheelchair (measurements OT);Wheelchair cushion (measurements OT) (RW)     Recommendations for Other Services Rehab consult     Precautions / Restrictions Precautions Precautions: Fall Precaution Comments: log roll for comfort, pt denying back pain Restrictions Weight Bearing Restrictions: No      Mobility Bed Mobility Overal bed mobility: Needs Assistance Bed Mobility: Rolling, Sidelying to Sit, Sit to Sidelying Rolling: Mod assist Sidelying to sit: Max assist     Sit to sidelying: Max assist General bed mobility comments: for managment of BLE and cues    Transfers                   General transfer comment: defer      Balance Overall balance assessment: Needs assistance Sitting-balance support: Feet supported Sitting balance-Leahy Scale: Good                                     ADL either performed or assessed with clinical judgement   ADL Overall ADL's : Needs assistance/impaired Eating/Feeding: Independent;Bed level   Grooming: Set up;Sitting   Upper Body Bathing: Minimal assistance;Sitting   Lower Body Bathing: Maximal assistance;Bed level;+2 for physical assistance   Upper Body Dressing : Minimal assistance;Sitting   Lower Body Dressing: Maximal assistance;+2 for physical assistance;Bed level   Toilet Transfer: Maximal assistance   Toileting- Clothing Manipulation and Hygiene: Maximal assistance;Bed level       Functional mobility during ADLs: Maximal assistance General ADL Comments: pt limited by BLE weakness, she has good sitting balance but requires max A to transfer from sup>sit. BUE strength is good, benefits from cues fro sequencing     Vision Baseline Vision/History: 0 No visual deficits Ability to See  in Adequate Light: 0 Adequate Patient Visual Report: No change from baseline Vision Assessment?: No apparent visual deficits     Perception     Praxis      Pertinent Vitals/Pain Pain Assessment Pain Assessment: No/denies pain     Hand Dominance Right   Extremity/Trunk  Assessment Upper Extremity Assessment Upper Extremity Assessment: RUE deficits/detail;LUE deficits/detail RUE Deficits / Details: R weaker than L. residual from prior CVA. ROM and sensation is WFL RUE Sensation: WNL RUE Coordination: decreased fine motor LUE Deficits / Details: WNL   Lower Extremity Assessment Lower Extremity Assessment: Defer to PT evaluation   Cervical / Trunk Assessment Cervical / Trunk Assessment: Other exceptions Cervical / Trunk Exceptions: increased body habitus   Communication Communication Communication: Expressive difficulties (baseline since prior CVA)   Cognition Arousal/Alertness: Awake/alert Behavior During Therapy: WFL for tasks assessed/performed Overall Cognitive Status: Impaired/Different from baseline Area of Impairment: Orientation, Following commands, Problem solving, Awareness                 Orientation Level: Disoriented to, Time     Following Commands: Follows one step commands with increased time   Awareness: Emergent Problem Solving: Slow processing, Difficulty sequencing, Requires verbal cues General Comments: unable to state year. required increased time and cues for command following. son states she is presenting with her baseline cognition.     General Comments  VSS on RA, son present and supportive    Exercises     Shoulder Instructions      Home Living Family/patient expects to be discharged to:: Private residence Living Arrangements: Children Available Help at Discharge: Family;Available 24 hours/day Type of Home: House Home Access: Stairs to enter [employer] of Steps: 10   Home Layout: One level     Bathroom Shower/Tub: Teacher, early years/pre: Standard     Home Equipment: Cane - single point   Additional Comments: son lives with pt      Prior Functioning/Environment Prior Level of Function : Independent/Modified Independent             Mobility Comments: mobility with  SPC ADLs Comments: mod I, bathes sitting on the side of the tub, cooks/cleans        OT Problem List: Decreased strength;Decreased range of motion;Decreased activity tolerance;Impaired balance (sitting and/or standing);Decreased safety awareness;Decreased knowledge of use of DME or AE      OT Treatment/Interventions: Self-care/ADL training;Therapeutic exercise;DME and/or AE instruction;Therapeutic activities;Patient/family education;Balance training    OT Goals(Current goals can be found in the care plan section) Acute Rehab OT Goals Patient Stated Goal: to walk again OT Goal Formulation: With patient Time For Goal Achievement: 12/06/21 Potential to Achieve Goals: Good ADL Goals Pt Will Perform Lower Body Bathing: with supervision;sit to/from stand Pt Will Perform Lower Body Dressing: with supervision;sit to/from stand Pt Will Transfer to Toilet: with supervision;ambulating Additional ADL Goal #1: Pt will independently complete bed mobility as a precursor to ADLs  OT Frequency: Min 2X/week    Co-evaluation              AM-PAC OT "6 Clicks" Daily Activity     Outcome Measure Help from another person eating meals?: None Help from another person taking care of personal grooming?: A Little Help from another person toileting, which includes using toliet, bedpan, or urinal?: A Lot Help from another person bathing (including washing, rinsing, drying)?: A Lot Help from another person to put on and taking off regular upper body clothing?: A Little Help from  another person to put on and taking off regular lower body clothing?: A Lot 6 Click Score: 16   End of Session Nurse Communication: Mobility status  Activity Tolerance: Patient tolerated treatment well Patient left: in bed;with call bell/phone within reach;with bed alarm set;with family/visitor present  OT Visit Diagnosis: Other abnormalities of gait and mobility (R26.89);Unsteadiness on feet (R26.81);Muscle weakness  (generalized) (M62.81);History of falling (Z91.81);Hemiplegia and hemiparesis Hemiplegia - caused by: Unspecified                Time: 1415-1435 OT Time Calculation (min): 20 min Charges:  OT General Charges $OT Visit: 1 Visit OT Evaluation $OT Eval Moderate Complexity: 1 Mod   Deondra Wigger A Paije Goodhart 11/22/2021, 2:59 PM

## 2021-11-22 NOTE — Progress Notes (Signed)
°  Transition of Care [facility]) Screening Note   Patient Details  Name: Brandi [name] Date of Birth: 1956/04/19   Transition of Care [facility]) CM/SW Contact:    Pollie Friar, RN Phone Number: 11/22/2021, 10:50 AM    Transition of Care Department [facility]) has reviewed patient. We will continue to monitor patient advancement through interdisciplinary progression rounds. If new patient transition needs arise, please place a TOC consult.

## 2021-11-22 NOTE — Evaluation (Signed)
Physical Therapy Evaluation Patient Details Name: Brandi [name] MRN: [id number] DOB: [birth year] Today's Date: 11/22/2021  History of Present Illness  66 y/o female presented to ED on 11/20/21 of bilateral LE weakness x 1 week and fall out of bed. CT head and MRI negative. 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 PMH: CVA, HTN, Diabetes  Clinical Impression  Pt presents to PT with deficits in strength, sensation, power, endurance, functional mobility, balance. Pt requires significant assistance to perform all functional mobility and to maintain unsupported sitting balance at this time. Pt reports R sided weakness and BLE weakness, only able to stand with 2 person assistance. Pt will benefit from continued aggressive mobility in an effort to improve strength and reduce caregiver burden. PT recommends AIR as the pt was independent with cane prior to this admission and is highly motivated to make functional mobility gains.     Recommendations for follow up therapy are one component of a multi-disciplinary discharge planning process, led by the attending physician.  Recommendations may be updated based on patient status, additional functional criteria and insurance authorization.  Follow Up Recommendations Acute inpatient rehab (3hours/day)    Assistance Recommended at Discharge Frequent or constant Supervision/Assistance  Patient can return home with the following  Two people to help with walking and/or transfers;Two people to help with bathing/dressing/bathroom;Assistance with cooking/housework;Direct supervision/assist for medications management;Direct supervision/assist for financial management;Assist for transportation;Help with stairs or ramp for entrance    Equipment Recommendations Hospital bed;Wheelchair (measurements PT);Wheelchair cushion (measurements PT);Other (comment) (hoyer lift)  Recommendations for Other Services  Rehab  consult    Functional Status Assessment Patient has had a recent decline in their functional status and demonstrates the ability to make significant improvements in function in a reasonable and predictable amount of time.     Precautions / Restrictions Precautions Precautions: Fall Precaution Comments: log roll for comfort, pt denying back pain Restrictions Weight Bearing Restrictions: No      Mobility  Bed Mobility Overal bed mobility: Needs Assistance Bed Mobility: Supine to Sit, Sit to Supine     Supine to sit: Max assist, HOB elevated Sit to supine: Max assist        Transfers Overall transfer level: Needs assistance Equipment used: 2 person hand held assist Transfers: Sit to/from Stand Sit to Stand: Max assist, +2 physical assistance, From elevated surface           General transfer comment: PT provides L knee block, PT and pt's son assist in sit to stand transfer with verbal cues provided for hip and trunk extension. PT unsuccessful 1st attempt, 2nd attempt able to extend hips with mild trunk flexion    Ambulation/Gait                  Stairs            Wheelchair Mobility    Modified Rankin (Stroke Patients Only)       Balance Overall balance assessment: Needs assistance Sitting-balance support: Bilateral upper extremity supported, Feet supported Sitting balance-Leahy Scale: Poor Sitting balance - Comments: min with BUE support, without UE support pt requries mod-maxA to maintain sitting balance, posterior losses of balance Postural control: Posterior lean Standing balance support: Bilateral upper extremity supported Standing balance-Leahy Scale: Zero Standing balance comment: maxA x 2                             Pertinent  Vitals/Pain Pain Assessment Pain Assessment: No/denies pain    Home Living Family/patient expects to be discharged to:: Private residence Living Arrangements: Children Available Help at Discharge:  Family;Available 24 hours/day Type of Home: House Home Access: Stairs to enter Entrance Stairs-Rails: Right Entrance Stairs-Number of Steps: 10   Home Layout: One level Home Equipment: Cane - single point Additional Comments: son lives with pt    Prior Function Prior Level of Function : Independent/Modified Independent             Mobility Comments: mobility with SPC ADLs Comments: mod I, bathes sitting on the side of the tub, cooks/cleans     Hand Dominance   Dominant Hand: Right    Extremity/Trunk Assessment   Upper Extremity Assessment Upper Extremity Assessment: Defer to OT evaluation RUE Deficits / Details: R weaker than L. residual from prior CVA. ROM and sensation is WFL RUE Sensation: WNL RUE Coordination: decreased fine motor LUE Deficits / Details: WNL    Lower Extremity Assessment Lower Extremity Assessment: RLE deficits/detail;LLE deficits/detail RLE Deficits / Details: strength grossly 3-/5, limited ankle PF/DF ROM actively, WFL passive RLE Sensation: decreased light touch LLE Deficits / Details: strength grossly 3-/5, limited ankle PF/DF ROM actively, WFL passive LLE Sensation: WNL    Cervical / Trunk Assessment Cervical / Trunk Assessment: Other exceptions Cervical / Trunk Exceptions: increased body habitus  Communication   Communication: Expressive difficulties (baseline since prior CVA)  Cognition Arousal/Alertness: Awake/alert Behavior During Therapy: WFL for tasks assessed/performed Overall Cognitive Status: Impaired/Different from baseline Area of Impairment: Problem solving                             Problem Solving: Slow processing, Difficulty sequencing          General Comments General comments (skin integrity, edema, etc.): VSS on RA    Exercises     Assessment/Plan    PT Assessment Patient needs continued PT services  PT Problem List Decreased strength;Decreased activity tolerance;Decreased balance;Decreased  range of motion;Decreased mobility;Decreased cognition;Impaired sensation       PT Treatment Interventions DME instruction;Gait training;Functional mobility training;Therapeutic activities;Therapeutic exercise;Balance training;Neuromuscular re-education;Patient/family education;Wheelchair mobility training    PT Goals (Current goals can be found in the Care Plan section)  Acute Rehab PT Goals Patient Stated Goal: to regain strength in legs and walk again PT Goal Formulation: With patient/family Time For Goal Achievement: 12/06/21 Potential to Achieve Goals: Fair    Frequency Min 3X/week     Co-evaluation               AM-PAC PT "6 Clicks" Mobility  Outcome Measure Help needed turning from your back to your side while in a flat bed without using bedrails?: A Lot Help needed moving from lying on your back to sitting on the side of a flat bed without using bedrails?: A Lot Help needed moving to and from a bed to a chair (including a wheelchair)?: Total Help needed standing up from a chair using your arms (e.g., wheelchair or bedside chair)?: Total Help needed to walk in hospital room?: Total Help needed climbing 3-5 steps with a railing? : Total 6 Click Score: 8    End of Session   Activity Tolerance: Patient tolerated treatment well Patient left: in bed;with call bell/phone within reach;with bed alarm set Nurse Communication: Mobility status;Need for lift equipment PT Visit Diagnosis: Other abnormalities of gait and mobility (R26.89);Muscle weakness (generalized) (M62.81);Other symptoms and signs involving the  nervous system (R29.898)    Time: 8413-2440 PT Time Calculation (min) (ACUTE ONLY): 23 min   Charges:   PT Evaluation $PT Eval Low Complexity: [place], PT, DPT Acute Rehabilitation Pager: [phone] Office [phone]   Zenaida Niece 11/22/2021, 5:21 PM

## 2021-11-22 NOTE — Assessment & Plan Note (Addendum)
-   Hemoglobin in the range of 8.  Iron studies showed severe iron deficiency. -Supplemented with  IV iron.  Patient also needs to be continued on oral iron therapy -No report of hematochezia or melena. 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  At that time colonoscopy and EGD were normal -She has chronic iron-deficiency anemia and her baseline hemoglobin is in the range of 8

## 2021-11-22 NOTE — Hospital Course (Addendum)
Brandi [name] is a 66 y.o. female with medical history significant of with history of diabetes mellitus type 2, hypertension, hyperlipidemia, stroke, presents the ED with a chief complaint of bilateral leg weakness.  Work-up MRI lumbar spine showed spondylosis and severe stenosis.  Neurology was consulted and was recommended to transfer to [facility] for further evaluation by neurosurgery service.  Started on Decadron,now being tapered.  Neurosurgery and neurology consulted.  Neurosurgery says she is not a candidate for surgery and recommending rehabilitation.  PT/OT recommending CIR, pending authorization.  Discharge planning to CIR versus skilled nursing facility.

## 2021-11-22 NOTE — Progress Notes (Signed)
I just returned to the room and spent considerable time with this patient, along with my nurse practitioner.  Her son is in the room and helps with the history.  The patient is a very poor historian.  She does have some speech issues from her previous stroke.  It appears that symptoms started acutely last Wednesday.  By Thursday she could not walk.  It sounds like it did start with some tingling in the lower legs.  She denies pain that sounds radicular or like claudication leading up to the episode though she does states she had a history of back problems years ago.  She has not had bladder issues and states that she can feel her catheter in her bladder.  She has a Foley catheter now.  She complains of some neck pain and some "tailbone "pain.  She mentions buttocks pain but states that was years ago.  She states the numbness in her legs has actually improved.  Again, I asked multiple times and it sounds like the numbness tingling and weakness started very acutely 1 week ago.  It was not a slow progressive course.  She denies a sensory level above the knees.  There is no thoracic sensory level.  She has no upper extremity symptoms or signs.  On exam, she has gross sensation in the lower extremities though it is reduced on the left.  It seems somewhat better on the right.  However her left lower extremity strength is better than her right. Sensory loss is distal.   R HF 1/5, R KE 1/5, R DF 2/5, R PF 3/5 L HF 2/5, L KE 2/5, L DF 2/5, L PF 2/5  No clonus, toes down going, DTRs decreased  I have reviewed her imaging independently.  I reviewed her thoracic spine as well as her lumbar spine MRI.  Thoracic spine MRI does show some multilevel spondylosis but no high-grade stenosis or cord compression and no signal change in the cord to suggest myelopathy or AV fistula or transverse myelitis or other cause of acute lower extremity paraparesis.  MRI of the lumbar spine shows multilevel spondylosis with  moderate stenosis at L2-3, severe stenosis at L3-4, and moderately severe stenosis at L4-5, left lateral recess worse than right.  Very difficult situation and a poor historian with acute onset of fairly dense, fairly symmetric paraparesis in the lower extremities from the hips down.  She appears weaker proximally than distally.  Her sensory loss is crossed from her weakness.  This presentation would be very atypical of chronic lumbar spinal stenosis, especially in the light of improving numbness and no loss of bowel and bladder function.  Also, her weak hip flexors would suggest disease above L2-3 or L3-4, so again this presentation of severely weak hip flexors would be atypical for her lumbar spinal stenosis.  I do not remember a case in my 23 years of practice of acute dense paraparesis from chronic lumbar spinal stenosis below L2-3.  Therefore, it is very difficult to know what to do in this situation.  I suspect there is something else going on separate from her lumbar spinal stenosis.  Decompressive laminectomy at 3 levels in this obese female with diabetes is not without risk of harm, and therefore we only want to move forward if we think there is a reasonable chance of improvement.  Unfortunately, her dense weakness for 1 week also greatly reduces her prognosis and her chance of improvement with decompressive surgery even if somehow we were to  prove that her weakness was from her lumbar spine disease.  Unfortunately, nerve conduction studies cannot be done in this institution on an inpatient basis, and she has not had symptoms long enough for them to reveal the acute etiology of her symptoms and signs.  Given all of this, we have asked for neurology to see the patient in consultation and offer some input regarding potential noncompressive etiologies of her leg weakness.  We have called in the consult personally but have not heard back from neurology quite yet. We recommend reaching out to them.

## 2021-11-22 NOTE — Consult Note (Signed)
Reason for Consult: Lower extremity weakness Referring Physician: edp  Brandi [name] is an 66 y.o. female.   HPI:  66 year old female presenting to the ED with lower extremity weakness.  History is really difficult to obtain.  Her son and daughter is in the room at the bedside.  One-story received was that last Thursday is when all of her symptoms started with the progressive leg weakness.  Another story received was that her leg weakness started on Sunday.  She states that she was standing up in the bathroom when her legs went numb and tingly.  She was unable to stand after this happened.  She has not been able to walk.  She denies ever having any back pain or leg pain prior to this happening.  Denies any change in her bowel or bladder habits.  She has been able to void easily.  States that the numbness and tingling has actually improved since being on the steroids.  She has a history of retention, diabetes and a stroke.  She denies being on any blood thinners besides aspirin.  Patient denies any radicular sensations to her chest or abdomen  Past Medical History:  Diagnosis Date   Anemia    Diabetes mellitus without complication ([place])    Hypertension    Stroke ([place]) 2013    Past Surgical History:  Procedure Laterality Date   BOWEL RESECTION N/A 02/01/2017   Procedure: PARTIAL SMALL BOWEL RESECTION;  Surgeon: Aviva Signs, MD;  Location: [facility];  Service: General;  Laterality: N/A;   COLONOSCOPY WITH PROPOFOL N/A 01/26/2017   Procedure: COLONOSCOPY WITH PROPOFOL;  Surgeon: Daneil Dolin, MD;  Location: [facility];  Service: Endoscopy;  Laterality: N/A;   cyst removed from right breast     ESOPHAGOGASTRODUODENOSCOPY (EGD) WITH PROPOFOL N/A 01/26/2017   Procedure: ESOPHAGOGASTRODUODENOSCOPY (EGD) WITH PROPOFOL;  Surgeon: Daneil Dolin, MD;  Location: [facility];  Service: Endoscopy;  Laterality: N/A;   GIVENS CAPSULE STUDY N/A 01/27/2017   Procedure: GIVENS CAPSULE STUDY;  Surgeon:  Daneil Dolin, MD;  Location: [facility];  Service: Endoscopy;  Laterality: N/A;   KNEE SURGERY  right knee   LAPAROTOMY N/A 02/01/2017   Procedure: EXPLORATORY LAPAROTOMY;  Surgeon: Aviva Signs, MD;  Location: [facility];  Service: General;  Laterality: N/A;   POLYPECTOMY  01/26/2017   Procedure: POLYPECTOMY;  Surgeon: Daneil Dolin, MD;  Location: [facility];  Service: Endoscopy;;  sigmoid and rectal    Allergies  Allergen Reactions   Codeine     Unknown     Social History   Tobacco Use   Smoking status: Former    Packs/day: 1.00    Years: 25.00    Pack years: 25.00    Types: Cigarettes    Quit date: 2013    Years since quitting: 10.1   Smokeless tobacco: Never  Substance Use Topics   Alcohol use: No    Family History  Problem Relation Age of Onset   Heart attack Father    Ulcers Mother        stomach     Review of Systems  Positive ROS: As above  All other systems have been reviewed and were otherwise negative with the exception of those mentioned in the HPI and as above.  Objective: Vital signs in last 24 hours: Temp:  [97.8 F (36.6 C)-98.5 F (36.9 C)] 97.8 F (36.6 C) (02/08 0349) Pulse Rate:  [58-72] 63 (02/08 0901) Resp:  [14-36] 18 (  02/08 0901) BP: (117-169)/(42-92) 136/92 (02/08 0901) SpO2:  [95 %-100 %] 97 % (02/08 0901)  General Appearance: Alert, cooperative, no distress, appears stated age Head: Normocephalic, without obvious abnormality, atraumatic Eyes: PERRL, conjunctiva/corneas clear, EOM's intact, fundi benign, both eyes      Back: Symmetric, no curvature, ROM normal, no CVA tenderness Lungs:  respirations unlabored Heart: Regular rate and rhythm Extremities: Extremities normal, atraumatic, no cyanosis or edema Pulses: 2+ and symmetric all extremities Skin: Skin color, texture, turgor normal, no rashes or lesions  NEUROLOGIC:   Mental status: A&O x4, no aphasia, good attention span, Memory and fund of knowledge Motor Exam  -bilateral hip flexors 0 out of 5, bilateral knee extension 1 out of 5, bilateral dorsiflexion 0 out of 5, bilateral dorsiflexion 3 out of 5, bilateral EHL 3 out of 5.  Sensory Exam - grossly normal, intact Reflexes: symmetric, no pathologic reflexes, No Hoffman's, No clonus Coordination -unable to test Gait -unable to test Balance -able to test Cranial Nerves: I: smell Not tested  II: visual acuity  OS: na    OD: na  II: visual fields Full to confrontation  II: pupils Equal, round, reactive to light  III,VII: ptosis None  III,IV,VI: extraocular muscles  Full ROM  V: mastication   V: facial light touch sensation    V,VII: corneal reflex    VII: facial muscle function - upper    VII: facial muscle function - lower   VIII: hearing   IX: soft palate elevation    IX,X: gag reflex   XI: trapezius strength    XI: sternocleidomastoid strength   XI: neck flexion strength    XII: tongue strength      Data Review Lab Results  Component Value Date   WBC 10.3 11/22/2021   HGB 8.8 (L) 11/22/2021   HCT 30.3 (L) 11/22/2021   MCV 65.4 (L) 11/22/2021   PLT 463 (H) 11/22/2021   Lab Results  Component Value Date   NA 140 11/21/2021   K 3.8 11/21/2021   CL 108 11/21/2021   CO2 24 11/21/2021   BUN 13 11/21/2021   CREATININE 0.85 11/21/2021   GLUCOSE 199 (H) 11/21/2021   No results found for: INR, PROTIME  Radiology: CT Head Wo Contrast  Result Date: 11/20/2021 CLINICAL DATA:  Mental status change, unknown cause EXAM: CT HEAD WITHOUT CONTRAST TECHNIQUE: Contiguous axial images were obtained from the base of the skull through the vertex without intravenous contrast. RADIATION DOSE REDUCTION: This exam was performed according to the departmental dose-optimization program which includes automated exposure control, adjustment of the mA and/or kV according to patient size and/or use of iterative reconstruction technique. COMPARISON:  None. FINDINGS: Brain: There is no acute intracranial  hemorrhage, mass effect, or edema. Chronic infarct of the left parietotemporal lobes with associated volume loss. No hydrocephalus or extra-axial collection. Vascular: There is atherosclerotic calcification at the skull base. Skull: Calvarium is unremarkable. Sinuses/Orbits: No acute finding. Other: None. IMPRESSION: No acute intracranial abnormality. Chronic left parietotemporal infarct. Electronically Signed   By: Macy Mis M.D.   On: 11/20/2021 14:47   MR ANGIO HEAD WO CONTRAST  Result Date: 11/20/2021 CLINICAL DATA:  Mental status change, unknown cause EXAM: MRI HEAD WITHOUT CONTRAST TECHNIQUE: Multiplanar, multiecho pulse sequences of the brain and surrounding structures were obtained without intravenous contrast. COMPARISON:  Same day CT head.  MRI/MRA 06/24/2012. FINDINGS: Incomplete and limited exam due to patient intolerance/back pain. The following sequences were obtained: DWI/ADC, sagittal T1 axial T2 and  limited gradient echo. The MRA was attempted; however, nondiagnostic. Within this limitation: MRI: Brain: Left temporoparietal encephalomalacia, new since 2013. 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 No evidence of acute infarct, acute hemorrhage mass lesion, midline shift, or hydrocephalus. Atrophy and ex vacuo ventricular dilation. Vascular: Major arterial flow voids are maintained at the skull base. Skull and upper cervical spine: Normal marrow signal. Degenerative changes in the visualized upper cervical spine. Sinuses/Orbits: Clear sinuses.  Unremarkable orbits. Other: No mastoid effusions. MRA: Nondiagnostic due to artifact. IMPRESSION: MRI: 1. Incomplete and limited study, as detailed above. Within this limitation, no evidence of acute abnormality. 2. Chronic left temporoparietal encephalomalacia. MRA: Nondiagnostic study.  Consider CTA to further evaluate. Electronically Signed   By: Margaretha Sheffield M.D.   On: 11/20/2021 18:07   MR BRAIN  WO CONTRAST  Result Date: 11/20/2021 CLINICAL DATA:  Mental status change, unknown cause EXAM: MRI HEAD WITHOUT CONTRAST TECHNIQUE: Multiplanar, multiecho pulse sequences of the brain and surrounding structures were obtained without intravenous contrast. COMPARISON:  Same day CT head.  MRI/MRA 06/24/2012. FINDINGS: Incomplete and limited exam due to patient intolerance/back pain. The following sequences were obtained: DWI/ADC, sagittal T1 axial T2 and limited gradient echo. The MRA was attempted; however, nondiagnostic. Within this limitation: MRI: Brain: Left temporoparietal encephalomalacia, new since 2013. 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 No evidence of acute infarct, acute hemorrhage mass lesion, midline shift, or hydrocephalus. Atrophy and ex vacuo ventricular dilation. Vascular: Major arterial flow voids are maintained at the skull base. Skull and upper cervical spine: Normal marrow signal. Degenerative changes in the visualized upper cervical spine. Sinuses/Orbits: Clear sinuses.  Unremarkable orbits. Other: No mastoid effusions. MRA: Nondiagnostic due to artifact. IMPRESSION: MRI: 1. Incomplete and limited study, as detailed above. Within this limitation, no evidence of acute abnormality. 2. Chronic left temporoparietal encephalomalacia. MRA: Nondiagnostic study.  Consider CTA to further evaluate. Electronically Signed   By: Margaretha Sheffield M.D.   On: 11/20/2021 18:07   MR THORACIC SPINE WO CONTRAST  Result Date: 11/21/2021 CLINICAL DATA:  Mid back pain and bilateral leg weakness. EXAM: MRI THORACIC SPINE WITHOUT CONTRAST TECHNIQUE: Multiplanar, multisequence MR imaging of the thoracic spine was performed. No intravenous contrast was administered. COMPARISON:  None. FINDINGS: Despite efforts by the technologist and patient, motion artifact is present on today's exam and could not be eliminated. This reduces exam sensitivity and specificity. Alignment:   Physiologic. Vertebrae: No fracture, evidence of discitis, or bone lesion. Cord:  Normal signal and morphology. Paraspinal and other soft tissues: Trace bilateral pleural effusions. Disc levels: T1-T2: Small shallow central disc protrusion. Mild bilateral neuroforaminal stenosis. T2-T3: Asymmetric left-sided disc osteophyte complex. Mild spinal canal and bilateral neuroforaminal stenosis. T3-T4: Bulky circumferential disc osteophyte complex and bilateral facet arthropathy. Moderate spinal canal stenosis. Mild bilateral neuroforaminal stenosis. T4-T5: Broad-based posterior disc osteophyte complex and mild bilateral facet arthropathy. Moderate spinal canal stenosis. Moderate right and mild left neuroforaminal stenosis. T5-T6: Negative disc. Mild bilateral facet arthropathy. No stenosis. T6-T7: Small left paracentral disc protrusion.  No stenosis. T7-T8: Small central disc protrusion.  No stenosis. T8-T9: Tiny right paracentral disc protrusion.  No stenosis. T9-T10: Negative. T10-T11: Mild disc bulging and moderate bilateral facet arthropathy. Mild spinal canal stenosis. Moderate left greater than right neuroforaminal stenosis. T11-T12: Negative disc. Mild bilateral facet arthropathy. No stenosis. IMPRESSION: 1. Multilevel degenerative changes of the thoracic spine as described above. Moderate spinal canal stenosis at T3-T4 and T4-T5. 2. Moderate neuroforaminal  stenosis on the right at T4-T5 and bilaterally at T10-T11. 3. Trace bilateral pleural effusions. Electronically Signed   By: Titus Dubin M.D.   On: 11/21/2021 10:37   MR LUMBAR SPINE WO CONTRAST  Result Date: 11/20/2021 CLINICAL DATA:  Low back pain. EXAM: MRI LUMBAR SPINE WITHOUT CONTRAST TECHNIQUE: Multiplanar, multisequence MR imaging of the lumbar spine was performed. No intravenous contrast was administered. COMPARISON:  None. FINDINGS: Segmentation:  Standard. Alignment: No static listhesis. Dextroscoliosis of the thoracolumbar spine. Vertebrae: No  acute fracture, evidence of discitis, or aggressive bone lesion. Conus medullaris and cauda equina: Conus extends to the L1 level. Conus and cauda equina appear normal. Paraspinal and other soft tissues: No acute paraspinal abnormality. Disc levels: Disc spaces: Degenerative disease with disc height loss throughout the thoracolumbar spine. T10-11: Broad-based disc bulge. Moderate bilateral facet arthropathy. Mild spinal stenosis. Moderate bilateral foraminal stenosis. T11-12: Moderate bilateral facet arthropathy. Mild left foraminal stenosis. No central canal stenosis. T12-L1: Broad-based disc osteophyte complex eccentric towards the right. Severe bilateral facet arthropathy. No left foraminal stenosis. Moderate right foraminal stenosis. L1-L2: Broad-based disc bulge. Mild bilateral facet arthropathy. No foraminal or central canal stenosis. L2-L3: Mild broad-based disc bulge. Moderate bilateral facet arthropathy. Moderate spinal stenosis. Mild left foraminal stenosis. No right foraminal stenosis. L3-L4: Broad-based disc osteophyte complex. Severe bilateral facet arthropathy with ligamentum flavum infolding. Severe spinal stenosis. Severe left foraminal stenosis. Mild right foraminal stenosis. L4-L5: Broad-based disc osteophyte complex. Severe bilateral facet arthropathy with ligamentum flavum infolding. Severe right subarticular recess stenosis. Severe spinal stenosis. Mild left foraminal stenosis. Moderate-severe right foraminal stenosis. L5-S1: Mild broad-based disc bulge with a small left paracentral disc protrusion contacting the left intraspinal S1 nerve root. Severe right and moderate left facet arthropathy. Mild left foraminal stenosis. Severe right foraminal stenosis. IMPRESSION: 1. Diffuse lumbar spine spondylosis as described above. 2. Severe spinal stenosis at L3-4 and L4-5. 3. No acute osseous injury of the lumbar spine. Electronically Signed   By: Kathreen Devoid M.D.   On: 11/20/2021 17:57   DG Chest  Port 1 View  Result Date: 11/20/2021 CLINICAL DATA:  Fall. EXAM: PORTABLE CHEST 1 VIEW COMPARISON:  January 25, 2017. FINDINGS: The heart size and mediastinal contours are within normal limits. Both lungs are clear. The visualized skeletal structures are unremarkable. IMPRESSION: No active disease. Electronically Signed   By: Marijo Conception M.D.   On: 11/20/2021 14:39     Assessment/Plan: 66 year old female resented to [facility] emergency department on Monday with progressive leg weakness.  History is unknown whether this started on Sunday or last Thursday.  The history was difficult to obtain from the patient and the family.  MRI lumbar spine shows chronic severe spondylosis throughout her lumbar spine.  She has fairly severe stenosis at L2-3, L3-4, and L4-5.  This is nothing acute.  MRI thoracic shows moderate spinal stenosis at T3-4 and T4-5 with no signal change in her cord. This also appears chronic.  This is a difficult situation as her exam really does not correlate with her history.  The stenosis in her spine is all chronic and does not typically cause acute dense paraplegia.  I would like to consult neurology to get their input on the situation and if they feel there are any differential diagnoses that make more sense.  Discussed this case with Dr. Ronnald Ramp and he agrees with the plan.  He will also assess the patient with me.   Ocie Cornfield Jocelynne Duquette 11/22/2021 11:20 AM

## 2021-11-22 NOTE — Assessment & Plan Note (Signed)
BMI of 39.5

## 2021-11-23 ENCOUNTER — Inpatient Hospital Stay (HOSPITAL_COMMUNITY): Payer: Medicare Other

## 2021-11-23 DIAGNOSIS — R338 Other retention of urine: Secondary | ICD-10-CM

## 2021-11-23 LAB — GLUCOSE, CAPILLARY
Glucose-Capillary: 132 mg/dL — ABNORMAL HIGH (ref 70–99)
Glucose-Capillary: 143 mg/dL — ABNORMAL HIGH (ref 70–99)
Glucose-Capillary: 171 mg/dL — ABNORMAL HIGH (ref 70–99)

## 2021-11-23 LAB — CBC WITH DIFFERENTIAL/PLATELET
Abs Immature Granulocytes: 0.25 10*3/uL — ABNORMAL HIGH (ref 0.00–0.07)
Basophils Absolute: 0 10*3/uL (ref 0.0–0.1)
Basophils Relative: 0 %
Eosinophils Absolute: 0 10*3/uL (ref 0.0–0.5)
Eosinophils Relative: 0 %
HCT: 31.3 % — ABNORMAL LOW (ref 36.0–46.0)
Hemoglobin: 9 g/dL — ABNORMAL LOW (ref 12.0–15.0)
Immature Granulocytes: 2 %
Lymphocytes Relative: 11 %
Lymphs Abs: 1.2 10*3/uL (ref 0.7–4.0)
MCH: 19 pg — ABNORMAL LOW (ref 26.0–34.0)
MCHC: 28.8 g/dL — ABNORMAL LOW (ref 30.0–36.0)
MCV: 66.2 fL — ABNORMAL LOW (ref 80.0–100.0)
Monocytes Absolute: 0.5 10*3/uL (ref 0.1–1.0)
Monocytes Relative: 5 %
Neutro Abs: 9.2 10*3/uL — ABNORMAL HIGH (ref 1.7–7.7)
Neutrophils Relative %: 82 %
Platelets: 493 10*3/uL — ABNORMAL HIGH (ref 150–400)
RBC: 4.73 MIL/uL (ref 3.87–5.11)
RDW: 21.5 % — ABNORMAL HIGH (ref 11.5–15.5)
Smear Review: INCREASED
WBC: 11.1 10*3/uL — ABNORMAL HIGH (ref 4.0–10.5)
nRBC: 0.3 % — ABNORMAL HIGH (ref 0.0–0.2)

## 2021-11-23 MED ORDER — GADOBUTROL 1 MMOL/ML IV SOLN
10.0000 mL | Freq: Once | INTRAVENOUS | Status: AC | PRN
  Administered 2021-11-23: 10 mL via INTRAVENOUS

## 2021-11-23 MED ORDER — DEXAMETHASONE 4 MG PO TABS
6.0000 mg | ORAL_TABLET | Freq: Three times a day (TID) | ORAL | Status: DC
  Administered 2021-11-23 – 2021-11-24 (×2): 6 mg via ORAL
  Filled 2021-11-23 (×2): qty 1

## 2021-11-23 MED ORDER — TAMSULOSIN HCL 0.4 MG PO CAPS
0.4000 mg | ORAL_CAPSULE | Freq: Every day | ORAL | Status: DC
  Administered 2021-11-23 – 2021-11-30 (×8): 0.4 mg via ORAL
  Filled 2021-11-23 (×8): qty 1

## 2021-11-23 MED ORDER — CHLORHEXIDINE GLUCONATE CLOTH 2 % EX PADS
6.0000 | MEDICATED_PAD | Freq: Every day | CUTANEOUS | Status: DC
  Administered 2021-11-23 – 2021-11-30 (×8): 6 via TOPICAL

## 2021-11-23 MED ORDER — HYDRALAZINE HCL 25 MG PO TABS
25.0000 mg | ORAL_TABLET | Freq: Three times a day (TID) | ORAL | Status: DC
  Administered 2021-11-23 – 2021-11-30 (×18): 25 mg via ORAL
  Filled 2021-11-23 (×22): qty 1

## 2021-11-23 NOTE — Assessment & Plan Note (Addendum)
Foley placed here,now removed.  Started on tamsulosin.Voiding well spontaneously

## 2021-11-23 NOTE — Progress Notes (Signed)
Subjective: Patient reports no acute events overnight  Objective: Vital signs in last 24 hours: Temp:  [97.6 F (36.4 C)-98.3 F (36.8 C)] 98 F (36.7 C) (02/09 0610) Pulse Rate:  [56-63] 56 (02/09 0030) Resp:  [16-18] 16 (02/09 0610) BP: (102-139)/(58-92) 137/58 (02/09 0610) SpO2:  [97 %-99 %] 98 % (02/09 0610)  Intake/Output from previous day: 02/08 0701 - 02/09 0700 In: -  Out: 850 [Urine:850] Intake/Output this shift: No intake/output data recorded.    Lab Results: Lab Results  Component Value Date   WBC 11.1 (H) 11/23/2021   HGB 9.0 (L) 11/23/2021   HCT 31.3 (L) 11/23/2021   MCV 66.2 (L) 11/23/2021   PLT 493 (H) 11/23/2021   No results found for: INR, PROTIME BMET Lab Results  Component Value Date   NA 140 11/21/2021   K 3.8 11/21/2021   CL 108 11/21/2021   CO2 24 11/21/2021   GLUCOSE 199 (H) 11/21/2021   BUN 13 11/21/2021   CREATININE 0.85 11/21/2021   CALCIUM 8.8 (L) 11/21/2021    Studies/Results: MR THORACIC SPINE WO CONTRAST  Result Date: 11/21/2021 CLINICAL DATA:  Mid back pain and bilateral leg weakness. EXAM: MRI THORACIC SPINE WITHOUT CONTRAST TECHNIQUE: Multiplanar, multisequence MR imaging of the thoracic spine was performed. No intravenous contrast was administered. COMPARISON:  None. FINDINGS: Despite efforts by the technologist and patient, motion artifact is present on today's exam and could not be eliminated. This reduces exam sensitivity and specificity. Alignment:  Physiologic. Vertebrae: No fracture, evidence of discitis, or bone lesion. Cord:  Normal signal and morphology. Paraspinal and other soft tissues: Trace bilateral pleural effusions. Disc levels: T1-T2: Small shallow central disc protrusion. Mild bilateral neuroforaminal stenosis. T2-T3: Asymmetric left-sided disc osteophyte complex. Mild spinal canal and bilateral neuroforaminal stenosis. T3-T4: Bulky circumferential disc osteophyte complex and bilateral facet arthropathy. Moderate  spinal canal stenosis. Mild bilateral neuroforaminal stenosis. T4-T5: Broad-based posterior disc osteophyte complex and mild bilateral facet arthropathy. Moderate spinal canal stenosis. Moderate right and mild left neuroforaminal stenosis. T5-T6: Negative disc. Mild bilateral facet arthropathy. No stenosis. T6-T7: Small left paracentral disc protrusion.  No stenosis. T7-T8: Small central disc protrusion.  No stenosis. T8-T9: Tiny right paracentral disc protrusion.  No stenosis. T9-T10: Negative. T10-T11: Mild disc bulging and moderate bilateral facet arthropathy. Mild spinal canal stenosis. Moderate left greater than right neuroforaminal stenosis. T11-T12: Negative disc. Mild bilateral facet arthropathy. No stenosis. IMPRESSION: 1. Multilevel degenerative changes of the thoracic spine as described above. Moderate spinal canal stenosis at T3-T4 and T4-T5. 2. Moderate neuroforaminal stenosis on the right at T4-T5 and bilaterally at T10-T11. 3. Trace bilateral pleural effusions. Electronically Signed   By: Titus Dubin M.D.   On: 11/21/2021 10:37    Assessment/Plan: Patient making slow improvements. Her hip flexors have improved some, 3/5 bilaterally now. She actually has more strength and movement in her legs without being prompted. Waiting on neurology input. Dr. Ronnald Ramp was at bedside during examination.   LOS: 3 days    Ocie Cornfield [facility] 11/23/2021, 7:54 AM

## 2021-11-23 NOTE — Progress Notes (Signed)
PROGRESS NOTE  Brandi [name]  XBJ:[id number] DOB: 1956/05/15 DOA: 11/20/2021 PCP: [name], MD   Brief Narrative: Brandi [name] is a 66 y.o. female with medical history significant of with history of diabetes mellitus type 2, hypertension, hyperlipidemia, stroke, presents the ED with a chief complaint of bilateral leg weakness.  Work-up MRI lumbar spine showed spondylosis and severe stenosis.  Neurology was consulted and was recommended to transfer to [facility] for further evaluation by neurosurgery service.  Started on Decadron.  Neurosurgery and neurology consulted.  PT/OT recommending CIR   Assessment & Plan:  * Leg weakness, bilateral- (present on admission) -CT head, MRI and MRA of brain -although limited due to motion showed no acute intracranial changes. -MRI lumbar spine shows spondylosis and severe stenosis. -Neurosurgery consulted has recommended transfer to [facility] for further evaluation by neurosurgery service. -Patient has been started on Decadron 4 mg every 6 hours. -Continue supportive care.  -Neurosurgery consulted  And following, no plan for surgical intervention for now. -Neurosurgery suspecting possible other neurological cause for paraplegia, neurology consulted -PT/OT recommending CIR on  Discharge -Patient has mild improvement in the strength on bilateral lower extremities.  Weakness is more on the right lower extremity   Lumbar stenosis- (present on admission) - Being the main problem for patient's bilateral lower extremity weakness. -Continue treatment with steroids -Patient  transferred to [facility] for further evaluation by neurosurgery. -Will follow recommendations.   -Continue supportive care.  Obesity- (present on admission) BMI of 39.5  Acute urinary retention Foley placed here.  Started on tamsulosin  History of CVA (cerebrovascular accident) -Continue risk factor modifications -Continue the use of aspirin and statins.    -No new intracranial abnormalities appreciated on head images.   -Uses cane for ambulation  Hypokalemia- (present on admission) - Supplemented and corrected  Mixed hyperlipidemia- (present on admission) -Continue statin -Heart healthy diet discussed with patient.  DM type 2 (diabetes mellitus, type 2) ([place]) -Patient currently not taking medications for diabetes -Hemoglobin A1c found to be 8.7. -Patient needs to be started on antidiabetic medications on discharge. -Continue current insulin regimen for now  Microcytic anemia- (present on admission) - Hemoglobin in the range of 8.  Iron studies showed severe iron deficiency. -Supplemented with  IV iron.  Patient also needs to be continued on oral iron therapy -No report of hematochezia or melena. 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  At that time colonoscopy and EGD were normal -She has chronic iron-deficiency anemia and her baseline hemoglobin is in the range of 8  HTN (hypertension)- (present on admission) -Continue hydralazine, lisinopril and metoprolol -Heart healthy diet discussed with patient. -Monitor blood pressure.  Currently normotensive              DVT prophylaxis:heparin injection 5,000 Units Start: 11/20/21 2200 SCDs Start: 11/20/21 2137     Code Status: Full Code  Family Communication: Son at bedside  Patient status:Inpatient  Anticipated discharge to:CIR   Consultants: Neurology, neurosurgery  Procedures: None  Antimicrobials:  Anti-infectives (From admission, onward)    None       Subjective:  Patient seen and examined at the bedside this morning.  Hemodynamically stable.  Son at the bedside.  She has improved weakness on the bilateral lower extremities.  She was slightly able to lift her lower extremity on the left, right lower extremity still weak.  Problem with urinary retention.  Objective: Vitals:   11/23/21  0030  11/23/21 0610 11/23/21 0813 11/23/21 1203  BP: 139/67 (!) 137/58 (!) 160/78 (!) 153/65  Pulse: (!) 56  (!) 52 (!) 55  Resp: 16 16 17 20   Temp: 97.7 F (36.5 C) 98 F (36.7 C) 98.3 F (36.8 C) 98.5 F (36.9 C)  TempSrc: Oral Oral Oral Oral  SpO2: 97% 98% 96% 95%  Weight:      Height:        Intake/Output Summary (Last 24 hours) at 11/23/2021 1317 Last data filed at 11/23/2021 0220 Gross per 24 hour  Intake --  Output 850 ml  Net -850 ml   Filed Weights   11/20/21 1243  Weight: 111 kg    Examination:  General exam: Overall comfortable, not in distress, morbidly obese HEENT: PERRL Respiratory system:  no wheezes or crackles  Cardiovascular system: S1 & S2 heard, RRR.  Gastrointestinal system: Abdomen is nondistended, soft and nontender. Central nervous system: Alert and oriented, paraplegia, right lower extremity weaker than the left Motor on right lower extremity: 2/5 Motor on left lower extremity: 3/5 Extremities: No edema, no clubbing ,no cyanosis Skin: No rashes, no ulcers,no icterus      Data Reviewed: I have personally reviewed following labs and imaging studies  CBC: Recent Labs  Lab 11/20/21 1345 11/21/21 0348 11/22/21 0803 11/23/21 0131  WBC 9.7 5.7 10.3 11.1*  NEUTROABS 8.0* 5.0 8.7* 9.2*  HGB 9.5* 8.7* 8.8* 9.0*  HCT 33.0* 31.7* 30.3* 31.3*  MCV 67.5* 67.6* 65.4* 66.2*  PLT 427* 475* 463* 076*   Basic Metabolic Panel: Recent Labs  Lab 11/20/21 1345 11/21/21 0348  NA 139 140  K 3.2* 3.8  CL 103 108  CO2 26 24  GLUCOSE 115* 199*  BUN 14 13  CREATININE 1.02* 0.85  CALCIUM 8.9 8.8*  MG  --  2.0     Recent Results (from the past 240 hour(s))  Resp Panel by RT-PCR (Flu A&B, Covid) Nasopharyngeal Swab     Status: None   Collection Time: 11/20/21  2:13 PM   Specimen: Nasopharyngeal Swab; Nasopharyngeal(NP) swabs in vial transport medium  Result Value Ref Range Status   SARS Coronavirus 2 by RT PCR NEGATIVE NEGATIVE Final    Comment:  (NOTE) SARS-CoV-2 target nucleic acids are NOT DETECTED.  The SARS-CoV-2 RNA is generally detectable in upper respiratory specimens during the acute phase of infection. The lowest concentration of SARS-CoV-2 viral copies this assay can detect is 138 copies/mL. A negative result does not preclude SARS-Cov-2 infection and should not be used as the sole basis for treatment or other patient management decisions. A negative result may occur with  improper specimen collection/handling, submission of specimen other than nasopharyngeal swab, presence of viral mutation(s) within the areas targeted by this assay, and inadequate number of viral copies(<138 copies/mL). A negative result must be combined with clinical observations, patient history, and epidemiological information. The expected result is Negative.  Fact Sheet for Patients:  EntrepreneurPulse.com.au  Fact Sheet for Healthcare Providers:  IncredibleEmployment.be  This test is no t yet approved or cleared by the [place] FDA and  has been authorized for detection and/or diagnosis of SARS-CoV-2 by FDA under an Emergency Use Authorization (EUA). This EUA will remain  in effect (meaning this test can be used) for the duration of the COVID-19 declaration under Section 564(b)(1) of the Act, 21 U.S.C.section 360bbb-3(b)(1), unless the authorization is terminated  or revoked sooner.       Influenza A by PCR NEGATIVE NEGATIVE Final  Influenza B by PCR NEGATIVE NEGATIVE Final    Comment: (NOTE) The Xpert Xpress SARS-CoV-2/FLU/RSV plus assay is intended as an aid in the diagnosis of influenza from Nasopharyngeal swab specimens and should not be used as a sole basis for treatment. Nasal washings and aspirates are unacceptable for Xpert Xpress SARS-CoV-2/FLU/RSV testing.  Fact Sheet for Patients: EntrepreneurPulse.com.au  Fact Sheet for Healthcare  Providers: IncredibleEmployment.be  This test is not yet approved or cleared by the [place] FDA and has been authorized for detection and/or diagnosis of SARS-CoV-2 by FDA under an Emergency Use Authorization (EUA). This EUA will remain in effect (meaning this test can be used) for the duration of the COVID-19 declaration under Section 564(b)(1) of the Act, 21 U.S.C. section 360bbb-3(b)(1), unless the authorization is terminated or revoked.  Performed at [facility], [address] [place], [place] [address]      Radiology Studies: No results found.  Scheduled Meds:  aspirin  325 mg Oral Daily   dexamethasone (DECADRON) injection  4 mg Intravenous Q6H   ferrous sulfate  325 mg Oral Q breakfast   heparin  5,000 Units Subcutaneous Q8H   hydrALAZINE  25 mg Oral Daily   lisinopril  20 mg Oral Daily   metoprolol tartrate  50 mg Oral BID   pantoprazole  40 mg Oral Daily   polyethylene glycol  17 g Oral Daily   senna  1 tablet Oral BID   simvastatin  20 mg Oral Daily   traZODone  150 mg Oral QHS   Continuous Infusions:  sodium chloride 10 mL/hr at 11/22/21 1151     LOS: 3 days   Shelly Coss, MD [facility] P2/06/2022, 1:17 PM

## 2021-11-23 NOTE — Progress Notes (Signed)
? ?  Inpatient Rehab Admissions Coordinator : ? ?Per therapy recommendations, patient was screened for CIR candidacy by Kaidyn Javid RN MSN.  At this time patient appears to be a potential candidate for CIR. I will place a rehab consult per protocol for full assessment. Please call me with any questions. ? ?Leiya Keesey RN MSN ?Admissions Coordinator ?[phone] ?  ?

## 2021-11-23 NOTE — Consult Note (Addendum)
Neurology Consultation  Reason for Consult: Lower extremity weakness Referring Physician: Dr. Tawanna Solo  CC: Lower extremity weakness  History is obtained from: Patient, patient's son at bedside, chart review  HPI: Brandi [name] is a 66 y.o. female with a medical history significant for morbid obesity with a BMI of 39.5 kg/m2, anemia, type 2 diabetes mellitus, stroke with some reported residual right hand weakness, and essential hypertension who presented to [facility] ED on 2/6 for bilateral lower extremity weakness.  Patient endorses that about 2 weeks ago she had tingling in her left foot with "nerve pain" that has been intermittent since.  In her usual state of health, patient is able to ambulate in her home using a cane to use the restroom independently.  Patient's son states that on Monday 2/6 he was attempting to give her a bath on the side of the bed and noticed that she had bilateral leg numbness and weakness in addition to being unable to sit up on her own with imbalance.  He states that she started to slip off the bed and "her legs were like spaghetti" and she was unable to support her weight and slid to the ground prompting him to call EMS.  Patient's son reports that she has had back problems for many years and has also had knee issues for at least 18 years but denies any history of similar episodes of lower extremity weakness and numbness and notes that she does use a cane for ambulation at home.  Patient was given steroids during hospitalization with reported improvement in her lower extremity numbness and sensory deficits with reported residual left foot nerve pain and right lower extremity weakness. Further work up reveals imaging significant for severe spinal stenosis at L3-4 and L4-5.   ROS: A complete ROS was performed and is negative except as noted in the HPI.   Past Medical History:  Diagnosis Date   Anemia    Diabetes mellitus without complication ([place])    Hypertension     Stroke ([place]) 2013   Past Surgical History:  Procedure Laterality Date   BOWEL RESECTION N/A 02/01/2017   Procedure: PARTIAL SMALL BOWEL RESECTION;  Surgeon: Aviva Signs, MD;  Location: [facility];  Service: General;  Laterality: N/A;   COLONOSCOPY WITH PROPOFOL N/A 01/26/2017   Procedure: COLONOSCOPY WITH PROPOFOL;  Surgeon: Daneil Dolin, MD;  Location: [facility];  Service: Endoscopy;  Laterality: N/A;   cyst removed from right breast     ESOPHAGOGASTRODUODENOSCOPY (EGD) WITH PROPOFOL N/A 01/26/2017   Procedure: ESOPHAGOGASTRODUODENOSCOPY (EGD) WITH PROPOFOL;  Surgeon: Daneil Dolin, MD;  Location: [facility];  Service: Endoscopy;  Laterality: N/A;   GIVENS CAPSULE STUDY N/A 01/27/2017   Procedure: GIVENS CAPSULE STUDY;  Surgeon: Daneil Dolin, MD;  Location: [facility];  Service: Endoscopy;  Laterality: N/A;   KNEE SURGERY  right knee   LAPAROTOMY N/A 02/01/2017   Procedure: EXPLORATORY LAPAROTOMY;  Surgeon: Aviva Signs, MD;  Location: [facility];  Service: General;  Laterality: N/A;   POLYPECTOMY  01/26/2017   Procedure: POLYPECTOMY;  Surgeon: Daneil Dolin, MD;  Location: [facility];  Service: Endoscopy;;  sigmoid and rectal   Family History  Problem Relation Age of Onset   Heart attack Father    Ulcers Mother        stomach   Social History:   reports that she quit smoking about 10 years ago. Her smoking use included cigarettes. She has a 25.00 pack-year smoking  history. She has never used smokeless tobacco. She reports that she does not drink alcohol and does not use drugs.  Medications  Current Facility-Administered Medications:    0.9 %  sodium chloride infusion, , Intravenous, PRN, Shelly Coss, MD, Last Rate: 10 mL/hr at 11/22/21 1151, New Bag at 11/22/21 1151   acetaminophen (TYLENOL) tablet 650 mg, 650 mg, Oral, Q6H PRN, 650 mg at 11/22/21 0938 **OR** acetaminophen (TYLENOL) suppository 650 mg, 650 mg, Rectal, Q6H PRN, Zierle-Ghosh, Asia B, DO   ALPRAZolam  (XANAX) tablet 0.25 mg, 0.25 mg, Oral, BID PRN, Zierle-Ghosh, Asia B, DO   aspirin tablet 325 mg, 325 mg, Oral, Daily, Zierle-Ghosh, Asia B, DO, 325 mg at 11/23/21 1003   dexamethasone (DECADRON) injection 4 mg, 4 mg, Intravenous, Q6H, Zierle-Ghosh, Asia B, DO, 4 mg at 11/23/21 0610   ferrous sulfate tablet 325 mg, 325 mg, Oral, Q breakfast, Adhikari, Amrit, MD, 325 mg at 11/23/21 1003   heparin injection 5,000 Units, 5,000 Units, Subcutaneous, Q8H, Zierle-Ghosh, Asia B, DO, 5,000 Units at 11/23/21 0610   hydrALAZINE (APRESOLINE) tablet 25 mg, 25 mg, Oral, Daily, Zierle-Ghosh, Asia B, DO, 25 mg at 11/23/21 1003   lisinopril (ZESTRIL) tablet 20 mg, 20 mg, Oral, Daily, Zierle-Ghosh, Asia B, DO, 20 mg at 11/23/21 1003   metoprolol tartrate (LOPRESSOR) tablet 50 mg, 50 mg, Oral, BID, Opyd, Ilene Qua, MD, 50 mg at 11/22/21 0938   ondansetron (ZOFRAN) tablet 4 mg, 4 mg, Oral, Q6H PRN **OR** ondansetron (ZOFRAN) injection 4 mg, 4 mg, Intravenous, Q6H PRN, Zierle-Ghosh, Asia B, DO   oxyCODONE (Oxy IR/ROXICODONE) immediate release tablet 5 mg, 5 mg, Oral, TID PRN, Zierle-Ghosh, Asia B, DO, 5 mg at 11/22/21 2154   pantoprazole (PROTONIX) EC tablet 40 mg, 40 mg, Oral, Daily, Adhikari, Amrit, MD, 40 mg at 11/23/21 1003   polyethylene glycol (MIRALAX / GLYCOLAX) packet 17 g, 17 g, Oral, Daily, Adhikari, Amrit, MD, 17 g at 11/23/21 1005   senna (SENOKOT) tablet 8.6 mg, 1 tablet, Oral, BID, Adhikari, Amrit, MD, 8.6 mg at 11/23/21 1003   simvastatin (ZOCOR) tablet 20 mg, 20 mg, Oral, Daily, Zierle-Ghosh, Asia B, DO, 20 mg at 11/23/21 1003   traZODone (DESYREL) tablet 150 mg, 150 mg, Oral, QHS, Zierle-Ghosh, Asia B, DO, 150 mg at 11/22/21 2154  Exam: Current vital signs: BP (!) 160/78 (BP Location: Left Arm)    Pulse (!) 52    Temp 98.3 F (36.8 C) (Oral)    Resp 17    Ht 5\' 6"  (1.676 m)    Wt 111 kg    SpO2 96%    BMI 39.50 kg/m  Vital signs in last 24 hours: Temp:  [97.6 F (36.4 C)-98.3 F (36.8 C)] 98.3  F (36.8 C) (02/09 0813) Pulse Rate:  [52-60] 52 (02/09 0813) Resp:  [16-18] 17 (02/09 0813) BP: (102-160)/(58-88) 160/78 (02/09 0813) SpO2:  [96 %-99 %] 96 % (02/09 0813)  GENERAL: Awake, alert, sitting up comfortably in bed, in no acute distress Psych: Affect appropriate for situation, patient is calm and cooperative with examination Head: Normocephalic and atraumatic, without obvious abnormality EENT: Normal conjunctivae, dry mucous membranes, no OP obstruction LUNGS: Normal respiratory effort. Non-labored breathing on room air CV: Bradycardia on telemetry ABDOMEN: Soft, non-tender, non-distended Extremities: Warm, well perfused, right knee edema present   NEURO:  Mental Status: Awake, alert, and oriented to person, place, time, and situation. She is able to some details regarding her history of present illness with some clear timeline confusion and  differing reports of symptom onset and resolution throughout assessment. She is a poor historian.  Speech/Language: speech is mildly dysarthric at baseline per family at bedside.   Naming, repetition, fluency, and comprehension intact without aphasia. No neglect is noted. She is able to follow simple commands but does struggle with complex commands.  Cranial Nerves:  II: PERRL 3 mm/brisk. Visual fields full.  III, IV, VI: EOMI without ptosis, nystagmus, or gaze preference.  V: Sensation is intact to light touch and symmetrical to face.  VII: Face is symmetric resting and smiling.  VIII: Hearing is intact to voice IX, X: Palate elevation is symmetric. Phonation normal.  XI: Normal sternocleidomastoid and trapezius muscle strength XII: Tongue protrudes midline without fasciculations.   Motor: 5/5 strength is present in bilateral upper extremities.  Bilateral lower extremity strength assessment is significantly limited due to patient cooperation / trouble with understanding and following commands despite repeat coaching and instruction.   Patient does have right > left lower extremity weakness.  Through limited assessment: Right ankle dorsiflexion 3/5, plantarflexion 4/5, 3/5 hip flexion and extension, 2/5 hip abduction and adduction, 3/5 knee flexion and extension Left ankle dorsiflexion 4/5, plantarflexion 5/5, 4/5 hip extension, 3/5 hip flexion, 2/5 hip abduction and adduction, 4/5 knee flexion and extension Tone is normal. Bulk is normal.  Sensation: Patient reports tingling to the left foot with intact and symmetric sensation to light touch throughout. She reports decreased sensation to vibration throughout the right lower extremity and intact throughout other extremities. Cool temperature sensation is intact and symmetric throughout. DTRs: 2+ and symmetric patellae and biceps, 1+ achilles.  Gait: Deferred for patient's safety  Labs I have reviewed labs in epic and the results pertinent to this consultation are: CBC    Component Value Date/Time   WBC 11.1 (H) 11/23/2021 0131   RBC 4.73 11/23/2021 0131   HGB 9.0 (L) 11/23/2021 0131   HCT 31.3 (L) 11/23/2021 0131   HCT 29.7 (L) 01/28/2017 1405   PLT 493 (H) 11/23/2021 0131   MCV 66.2 (L) 11/23/2021 0131   MCH 19.0 (L) 11/23/2021 0131   MCHC 28.8 (L) 11/23/2021 0131   RDW 21.5 (H) 11/23/2021 0131   LYMPHSABS 1.2 11/23/2021 0131   MONOABS 0.5 11/23/2021 0131   EOSABS 0.0 11/23/2021 0131   BASOSABS 0.0 11/23/2021 0131   CMP     Component Value Date/Time   NA 140 11/21/2021 0348   K 3.8 11/21/2021 0348   CL 108 11/21/2021 0348   CO2 24 11/21/2021 0348   GLUCOSE 199 (H) 11/21/2021 0348   BUN 13 11/21/2021 0348   CREATININE 0.85 11/21/2021 0348   CALCIUM 8.8 (L) 11/21/2021 0348   PROT 7.1 11/21/2021 0348   ALBUMIN 3.0 (L) 11/21/2021 0348   AST 19 11/21/2021 0348   ALT 23 11/21/2021 0348   ALKPHOS 64 11/21/2021 0348   BILITOT 0.6 11/21/2021 0348   GFRNONAA >60 11/21/2021 0348   GFRAA >60 02/03/2017 0622   Lipid Panel     Component Value Date/Time    CHOL 195 06/24/2012 0446   TRIG 180 (H) 06/24/2012 0446   HDL 41 06/24/2012 0446   CHOLHDL 4.8 06/24/2012 0446   VLDL 36 06/24/2012 0446   LDLCALC 118 (H) 06/24/2012 0446   Imaging I have reviewed the images obtained:  MRI brain wo contrast 2/6: 1. Incomplete and limited study, as detailed above. Within this limitation, no evidence of acute abnormality. 2. Chronic left temporoparietal encephalomalacia.   MRA head  wo contrast 2/6:  Nondiagnostic study.  Consider CTA to further evaluate.  MR lumbar spine 2/6: 1. Diffuse lumbar spine spondylosis as described above. 2. Severe spinal stenosis at L3-4 and L4-5. 3. No acute osseous injury of the lumbar spine.  MR thoracic spine 2/6: 1. Multilevel degenerative changes of the thoracic spine as described above. Moderate spinal canal stenosis at T3-T4 and T4-T5. 2. Moderate neuroforaminal stenosis on the right at T4-T5 and bilaterally at T10-T11. 3. Trace bilateral pleural effusions.  Assessment: 66 y.o. female who presented to the ED 2/6 for evaluation of bilateral lower extremity numbness and weakness with improvement s/p steroid administration with persistent right > left lower extremity weakness on examination.  - Examination is significantly limited by patient's participation with trouble following commands and poor concentration. It does appear that she has right > left lower extremity weakness, neuropathy affecting the left foot, and decreased sensation to vibration throughout the right lower extremity.  - Imaging as above.  - Neurosurgery evaluated the patient and feels that there may be an etiology of her presentation outside of her chronic lumbar spinal stenosis as it is atypical for chronic lumbar stenosis to present with acute dense paraparesis. Will obtain MRI cervical and thoracic spine for evaluation of transverse myelitis as part of the differential diagnosis for patient's presentation.   Recommendations: - MRI cervical and  thoracic spine imaging with and without contrast - PT/OT evaluation  Pt seen by NP/Neuro and later by MD. Note/plan to be edited by MD as needed.  Anibal Henderson, AGAC-NP [facility] Pager: [phone]  I have seen the patient and reviewed the above note.   She has significant difficulty in relating history, but she is clear that there was a sudden abrupt worsening.  She feels there has been some improvement, though she still has significant impairment.  On exam, she has to be coached repeatedly, and with distraction her exam improved.  She does appear to have some distal >proximal weakness, particular with inversion/eversion.  She has intact reflexes.  I agree with Dr. Ronnald Ramp that this would be an unusual exam for lumbar spinal stenosis.  Typically at that level there is decreased reflexes which is not present in this patient.  Given her complaint of neck pain at onset, I do think it would be worthwhile to do both an MRI of her thoracic as well as cervical spine.  A partial intrinsic cord lesion such as transverse myelitis could give such a presentation and she could be partially responding to steroids.  Compressive etiology would be another consideration.  At this time, I feel like the neck step would be imaging of the thoracic and cervical spine.  If this is negative, given the patient is improving, I think continued physical therapy/Occupational Therapy would be a good next step.  Roland Rack, MD [facility] [phone]  If 7pm- 7am, please page neurology on call as listed in [place].

## 2021-11-23 NOTE — Progress Notes (Signed)
Physical Therapy Treatment Patient Details Name: Brandi [name] MRN: [id number] DOB: 1955-12-17 Today's Date: 11/23/2021   History of Present Illness 66 y/o female presented to ED on 11/20/21 of bilateral LE weakness x 1 week and fall out of bed. CT head and MRI negative. 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 PMH: CVA, HTN, Diabetes    PT Comments    Pt very motivated to participate and work toward incr independence. Unable to achieve standing with Stedy and 2 person assist but with improved sitting balance. May benefit from use of Clarise Cruz Plus if body habitus will work with it.    Recommendations for follow up therapy are one component of a multi-disciplinary discharge planning process, led by the attending physician.  Recommendations may be updated based on patient status, additional functional criteria and insurance authorization.  Follow Up Recommendations  Acute inpatient rehab (3hours/day)     Assistance Recommended at Discharge Frequent or constant Supervision/Assistance  Patient can return home with the following Two people to help with walking and/or transfers;Two people to help with bathing/dressing/bathroom;Assistance with cooking/housework;Direct supervision/assist for medications management;Direct supervision/assist for financial management;Assist for transportation;Help with stairs or ramp for entrance (hoyer lift)   [facility] bed;Wheelchair (measurements PT);Wheelchair cushion (measurements PT);Other (comment) (hoyer lift)    Recommendations for Other Services       Precautions / Restrictions Precautions Precautions: Fall     Mobility  Bed Mobility Overal bed mobility: Needs Assistance Bed Mobility: Supine to Sit, Sit to Supine Rolling: Mod assist Sidelying to sit: +2 for physical assistance, Mod assist   Sit to supine: +2 for physical assistance, Mod assist   General bed mobility  comments: Assist to bring legs off of bed, elevate trunk into sitting, and bring hips to EOB. Assist to lower trunk and bring legs back up into bed.    Transfers Overall transfer level: Needs assistance Equipment used: Ambulation equipment used               General transfer comment: Attempted sit to stand from elevated bed with Stedy x 3. Used bed pad under hips. Unable to stand. Able to get hips ~6 inches off of bed. Used rocking momentum    Ambulation/Gait                   Marine [employer] (Stroke Patients Only)       Balance Overall balance assessment: Needs assistance Sitting-balance support: Bilateral upper extremity supported, Feet supported Sitting balance-Leahy Scale: Poor Sitting balance - Comments: Pt maintained static sitting EOB with BUE's and min guard. Intermittent min assist with dynamic balance                                    Cognition Arousal/Alertness: Awake/alert Behavior During Therapy: WFL for tasks assessed/performed Overall Cognitive Status: Impaired/Different from baseline Area of Impairment: Problem solving                             Problem Solving: Slow processing, Difficulty sequencing          Exercises      General Comments        Pertinent Vitals/Pain Pain Assessment Pain Assessment: No/denies pain    Home  Living                          Prior Function            PT Goals (current goals can now be found in the care plan section) Acute Rehab PT Goals Patient Stated Goal: to walk Progress towards PT goals: Progressing toward goals    Frequency    Min 3X/week      PT Plan Current plan remains appropriate    Co-evaluation              AM-PAC PT "6 Clicks" Mobility   Outcome Measure  Help needed turning from your back to your side while in a flat bed without using bedrails?: A Lot Help needed moving from  lying on your back to sitting on the side of a flat bed without using bedrails?: Total Help needed moving to and from a bed to a chair (including a wheelchair)?: Total Help needed standing up from a chair using your arms (e.g., wheelchair or bedside chair)?: Total Help needed to walk in hospital room?: Total Help needed climbing 3-5 steps with a railing? : Total 6 Click Score: 7    End of Session Equipment Utilized During Treatment: Gait belt Activity Tolerance: Patient tolerated treatment well Patient left: in bed;with call bell/phone within reach;with bed alarm set   PT Visit Diagnosis: Other abnormalities of gait and mobility (R26.89);Muscle weakness (generalized) (M62.81);Other symptoms and signs involving the nervous system (R29.898)     Time: 1530-1550 PT Time Calculation (min) (ACUTE ONLY): 20 min  Charges:  $Therapeutic Activity: 8-22 mins                     Mitchell Pager [phone] Office Lead 11/23/2021, 5:00 PM

## 2021-11-23 NOTE — Care Management Important Message (Signed)
Important Message  Patient Details  Name: Brandi [name] MRN: [id number] Date of Birth: [birth year]   Medicare Important Message Given:  Yes     Orbie Pyo 11/23/2021, 3:48 PM

## 2021-11-24 ENCOUNTER — Inpatient Hospital Stay (HOSPITAL_COMMUNITY): Payer: Medicare Other

## 2021-11-24 LAB — CBC WITH DIFFERENTIAL/PLATELET
Abs Immature Granulocytes: 0.28 10*3/uL — ABNORMAL HIGH (ref 0.00–0.07)
Basophils Absolute: 0 10*3/uL (ref 0.0–0.1)
Basophils Relative: 0 %
Eosinophils Absolute: 0 10*3/uL (ref 0.0–0.5)
Eosinophils Relative: 0 %
HCT: 31.2 % — ABNORMAL LOW (ref 36.0–46.0)
Hemoglobin: 8.9 g/dL — ABNORMAL LOW (ref 12.0–15.0)
Immature Granulocytes: 3 %
Lymphocytes Relative: 16 %
Lymphs Abs: 1.8 10*3/uL (ref 0.7–4.0)
MCH: 18.9 pg — ABNORMAL LOW (ref 26.0–34.0)
MCHC: 28.5 g/dL — ABNORMAL LOW (ref 30.0–36.0)
MCV: 66.1 fL — ABNORMAL LOW (ref 80.0–100.0)
Monocytes Absolute: 0.9 10*3/uL (ref 0.1–1.0)
Monocytes Relative: 8 %
Neutro Abs: 8.1 10*3/uL — ABNORMAL HIGH (ref 1.7–7.7)
Neutrophils Relative %: 73 %
Platelets: 494 10*3/uL — ABNORMAL HIGH (ref 150–400)
RBC: 4.72 MIL/uL (ref 3.87–5.11)
RDW: 21.8 % — ABNORMAL HIGH (ref 11.5–15.5)
Smear Review: INCREASED
WBC: 11.2 10*3/uL — ABNORMAL HIGH (ref 4.0–10.5)
nRBC: 0.8 % — ABNORMAL HIGH (ref 0.0–0.2)

## 2021-11-24 LAB — VITAMIN B12: Vitamin B-12: 349 pg/mL (ref 180–914)

## 2021-11-24 LAB — GLUCOSE, CAPILLARY
Glucose-Capillary: 135 mg/dL — ABNORMAL HIGH (ref 70–99)
Glucose-Capillary: 148 mg/dL — ABNORMAL HIGH (ref 70–99)
Glucose-Capillary: 195 mg/dL — ABNORMAL HIGH (ref 70–99)
Glucose-Capillary: 181 mg/dL — ABNORMAL HIGH (ref 70–99)

## 2021-11-24 MED ORDER — LORAZEPAM 1 MG PO TABS
1.0000 mg | ORAL_TABLET | Freq: Once | ORAL | Status: AC
  Administered 2021-11-24: 1 mg via ORAL
  Filled 2021-11-24: qty 1

## 2021-11-24 MED ORDER — DEXAMETHASONE 4 MG PO TABS
4.0000 mg | ORAL_TABLET | Freq: Four times a day (QID) | ORAL | Status: DC
  Administered 2021-11-24 – 2021-11-25 (×4): 4 mg via ORAL
  Filled 2021-11-24 (×4): qty 1

## 2021-11-24 NOTE — Progress Notes (Signed)
IP rehab admissions - I met with patient and gave her rehab booklets.  I will check her [employer] benefits.  Awaiting completion of workup prior to requesting acute inpatient rehab from insurance carrier.  Will follow up on Monday for progress.  Call for questions.  [phone]

## 2021-11-24 NOTE — Progress Notes (Signed)
Subjective: Patient reports doing better today. She reports some neck pain, buttocks pain, and chronic bilateral knee pain.  Objective: Vital signs in last 24 hours: Temp:  [97.8 F (36.6 C)-98.8 F (37.1 C)] 98.3 F (36.8 C) (02/10 1100) Pulse Rate:  [47-57] 57 (02/10 1100) Resp:  [18-22] 20 (02/10 1100) BP: (140-175)/(66-84) 140/72 (02/10 1100) SpO2:  [97 %-99 %] 99 % (02/10 1100)  Intake/Output from previous day: 02/09 0701 - 02/10 0700 In: 520 [P.O.:520] Out: 1500 [Urine:1500] Intake/Output this shift: No intake/output data recorded.  Neurologic: B hip flexion 3/5, B knee extension 4-/5, B plantar flexion 4/5, R dorsiflexion 2/5, L dorsiflexion 3/5  Lab Results: Lab Results  Component Value Date   WBC 11.2 (H) 11/24/2021   HGB 8.9 (L) 11/24/2021   HCT 31.2 (L) 11/24/2021   MCV 66.1 (L) 11/24/2021   PLT 494 (H) 11/24/2021   No results found for: INR, PROTIME BMET Lab Results  Component Value Date   NA 140 11/21/2021   K 3.8 11/21/2021   CL 108 11/21/2021   CO2 24 11/21/2021   GLUCOSE 199 (H) 11/21/2021   BUN 13 11/21/2021   CREATININE 0.85 11/21/2021   CALCIUM 8.8 (L) 11/21/2021    Studies/Results: MR CERVICAL SPINE W WO CONTRAST  Result Date: 11/23/2021 CLINICAL DATA:  Myelopathy, acute, cervical spine. EXAM: MRI CERVICAL SPINE WITHOUT AND WITH CONTRAST TECHNIQUE: Multiplanar and multiecho pulse sequences of the cervical spine, to include the craniocervical junction and cervicothoracic junction, were obtained without and with intravenous contrast. CONTRAST:  40mL GADAVIST GADOBUTROL 1 MMOL/ML IV SOLN COMPARISON:  None. FINDINGS: The study suffers from motion degradation due to lack of patient cooperation. Alignment: No malalignment. Vertebrae: No fracture. Benign appearing fat within the T3 vertebral body. Cord: No primary cord lesion.  See below regarding stenosis. Posterior Fossa, vertebral arteries, paraspinal tissues: Negative Disc levels: The foramen magnum is  widely patent. There is ordinary mild osteoarthritis of the C1-2 articulation but no encroachment upon the neural structures. C2-3: Mild bulging of the disc.  No compressive stenosis. C3-4 through C6-7: Spondylosis with endplate osteophytes and bulging of the disc at each level. Spinal stenosis at these levels with AP diameter of the canal ranging from a minimum of 5.5 mm at the C5-6 level to a maximum of 6.8 mm at C4-5, all resulting in a degree of cord deformity. Bilateral foraminal stenosis at those levels that could affect the exiting nerves. C7-T1: Mild bulging of the disc. Mild facet hypertrophy. No compressive canal stenosis. Mild bilateral foraminal narrowing. IMPRESSION: Chronic appearing degenerative spondylosis from C3-4 through C6-7 with canal stenosis throughout that region. Minimal AP diameter is 5.5 mm at the C5-6 level. There is effacement of the subarachnoid space and deformity of the cord at those levels. There is bilateral foraminal stenosis. Bilateral foraminal stenosis at C7-T1, primarily due to encroachment by degenerative facet osteophytes. Electronically Signed   By: Nelson Chimes M.D.   On: 11/23/2021 21:17   MR THORACIC SPINE W WO CONTRAST  Result Date: 11/23/2021 CLINICAL DATA:  Myelopathy, acute, thoracic spine. EXAM: MRI THORACIC WITHOUT AND WITH CONTRAST TECHNIQUE: Multiplanar and multiecho pulse sequences of the thoracic spine were obtained without and with intravenous contrast. CONTRAST:  44mL GADAVIST GADOBUTROL 1 MMOL/ML IV SOLN COMPARISON:  11/21/2021 FINDINGS: Alignment:  Mild scoliotic curvature. Vertebrae: Benign appearing fat within the T3 vertebral body as seen previously. Apparent solid bridging anterior osteophytes from T5 to T12. Cord: Cord deformity at T3-4 and T4-5 due to stenosis. See below.  Other lesser areas of encroachment. See below. Paraspinal and other soft tissues: Negative Disc levels: T1-2: Minimal bulging of the disc.  No stenosis. T2-3: Mild bulging of the  disc. Mild narrowing of the ventral subarachnoid space and foramina but without likely neural compression. T3-4: Endplate osteophytes and broad-based disc herniation. Bilateral facet degeneration and hypertrophy. Spinal stenosis with effacement of the subarachnoid space and some cord deformity at this level. Bilateral foraminal stenosis. T4-5: Endplate osteophytes and broad-based disc herniation. Bilateral facet hypertrophy. Spinal stenosis with effacement of the subarachnoid space and cord deformity at this level. Bilateral foraminal stenosis. T5-6: Anterior bridging osteophytes. No stenosis of the canal or foramina. T6-7 and T7-8: Chronic herniated disc material with calcified disc material in the midline versus calcification of the posterior longitudinal ligament. Effacement of the subarachnoid space with ventral cord deformity at both of these levels. Findings could be significant. T8-9: Solid bridging anterior osteophytes. Minimal non-compressive disc bulge. T9-10: Bridging anterior osteophytes. Shallow left posterolateral bulge but no neural compression. T10-11: Solid bridging anterior osteophytes. Mild bulging of the disc. Bilateral facet degeneration and hypertrophy. Canal narrowing with mild cord deformity. Bilateral foraminal stenosis could affect the T10 nerves. T11-12: Bridging anterior osteophytes. Mild bulging of the disc. No stenosis. T12-L1: Disc degeneration with endplate osteophytes and bulging of the disc. Facet and ligamentous hypertrophy worse on the right. Canal stenosis with effacement of the subarachnoid space and slight deformity of the cord. IMPRESSION: Degenerative spondylosis and facet arthropathy at T3-4 and T4-5, with canal and foraminal stenosis. Cord is deformed at those levels in the patient could suffer from symptoms of myelopathy based on the findings at these levels. Apparent solid bridging osteophytes from T5-T12. Chronic extruded calcified disc material versus calcified  longitudinal ligament from T6-T8, effacing the ventral subarachnoid space and indenting the ventral cord. This could be significant as well. T10-11 endplate osteophytes and bulging of the disc in combination with bilateral facet degeneration and hypertrophy. Canal narrowing with mild cord deformity. Bilateral foraminal stenosis could affect either T10 nerve. Electronically Signed   By: Nelson Chimes M.D.   On: 11/23/2021 21:27    Assessment/Plan: 66 year old female who presented to the hospital on Monday with sudden paraparesis but no signs of myelopathy. She was placed on decadron. She has actually made improvements neurologically and her strength is drastically better than when she came in on Monday. She has spinal stenosis in her C, T, and L spine that is all chronic. Would suggest getting a ct myelogram for better imaging.    LOS: 4 days    Brandi [name] 11/24/2021, 1:44 PM

## 2021-11-24 NOTE — Progress Notes (Signed)
Occupational Therapy Treatment Patient Details Name: Brandi [name] MRN: [id number] DOB: 05/26/1956 Today's Date: 11/24/2021   History of present illness 66 y/o female presented to ED on 11/20/21 of bilateral LE weakness x 1 week and fall out of bed. CT head and MRI negative. 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 PMH: CVA, HTN, Diabetes   OT comments  Patient received in supine and eager to work with OT. Patient assisted to EOB and performed gown change and grooming tasks seated on EOB. Patient demonstrated posterior leaning and was reliant on one extremity support for balance. Dynamic sitting balance was addressed when nursing stated that transportation was ready to take patient to MRI. Patient assisted back to supine.  Patient is very motivated towards therapy and is a good candidate for AIR.    Recommendations for follow up therapy are one component of a multi-disciplinary discharge planning process, led by the attending physician.  Recommendations may be updated based on patient status, additional functional criteria and insurance authorization.    Follow Up Recommendations  Acute inpatient rehab (3hours/day)    Assistance Recommended at Discharge Frequent or constant Supervision/Assistance  Patient can return home with the following  A lot of help with walking and/or transfers;A lot of help with bathing/dressing/bathroom;Assistance with cooking/housework;Assist for transportation;Help with stairs or ramp for entrance   Equipment Recommendations  Wheelchair (measurements OT);Wheelchair cushion (measurements OT)    Recommendations for Other Services      Precautions / Restrictions Precautions Precautions: Fall Restrictions Weight Bearing Restrictions: No       Mobility Bed Mobility Overal bed mobility: Needs Assistance Bed Mobility: Supine to Sit, Sit to Supine     Supine to sit: Mod assist, HOB elevated Sit to  supine: Max assist   General bed mobility comments: able to assist with bring legs off bed and required assistance with trunk    Transfers Overall transfer level: Needs assistance                 General transfer comment: focused on EOB sitting     Balance Overall balance assessment: Needs assistance Sitting-balance support: Single extremity supported, Bilateral upper extremity supported, Feet supported Sitting balance-Leahy Scale: Poor Sitting balance - Comments: reliant on at least one extremity support for sitting balance                                   ADL either performed or assessed with clinical judgement   ADL Overall ADL's : Needs assistance/impaired     Grooming: Wash/dry hands;Wash/dry face;Oral care;Min guard;Sitting Grooming Details (indicate cue type and reason): performed seated on EOB with min guard for balance due to reliant on at least one extremity for balance         Upper Body Dressing : Minimal assistance;Sitting Upper Body Dressing Details (indicate cue type and reason): changed gowns seated on EOB                   General ADL Comments: patient required one extremity support while seated on EOB    Extremity/Trunk Assessment Upper Extremity Assessment RUE Deficits / Details: R weaker than L. residual from prior CVA. ROM and sensation is WFL RUE Sensation: WNL RUE Coordination: decreased fine motor LUE Deficits / Details: WNL            Vision       Perception  Praxis      Cognition Arousal/Alertness: Awake/alert Behavior During Therapy: WFL for tasks assessed/performed Overall Cognitive Status: Impaired/Different from baseline Area of Impairment: Problem solving                             Problem Solving: Slow processing, Difficulty sequencing General Comments: difficulty following directions with commands require repeating        Exercises      Shoulder Instructions        General Comments      Pertinent Vitals/ Pain       Pain Assessment Pain Assessment: No/denies pain  Home Living                                          Prior Functioning/Environment              Frequency  Min 2X/week        Progress Toward Goals  OT Goals(current goals can now be found in the care plan section)  Progress towards OT goals: Progressing toward goals  Acute Rehab OT Goals Patient Stated Goal: get better OT Goal Formulation: With patient Time For Goal Achievement: 12/06/21 Potential to Achieve Goals: Good ADL Goals Pt Will Perform Lower Body Bathing: with supervision;sit to/from stand Pt Will Perform Lower Body Dressing: with supervision;sit to/from stand Pt Will Transfer to Toilet: with supervision;ambulating Additional ADL Goal #1: Pt will independently complete bed mobility as a precursor to ADLs  Plan Discharge plan remains appropriate    Co-evaluation                 AM-PAC OT "6 Clicks" Daily Activity     Outcome Measure   Help from another person eating meals?: None Help from another person taking care of personal grooming?: A Little Help from another person toileting, which includes using toliet, bedpan, or urinal?: A Lot Help from another person bathing (including washing, rinsing, drying)?: A Lot Help from another person to put on and taking off regular upper body clothing?: A Little Help from another person to put on and taking off regular lower body clothing?: A Lot 6 Click Score: 16    End of Session    OT Visit Diagnosis: Other abnormalities of gait and mobility (R26.89);Unsteadiness on feet (R26.81);Muscle weakness (generalized) (M62.81);History of falling (Z91.81);Hemiplegia and hemiparesis Hemiplegia - caused by: Unspecified   Activity Tolerance Patient tolerated treatment well   Patient Left in bed;with call bell/phone within reach;with nursing/sitter in room   Nurse Communication Mobility  status        Time: 1340-1400 OT Time Calculation (min): 20 min  Charges: OT General Charges $OT Visit: 1 Visit OT Treatments $Self Care/Home Management : 8-22 mins  Lodema Hong, [place]  Pager [phone] Office [place] 11/24/2021, 3:00 PM

## 2021-11-24 NOTE — Progress Notes (Signed)
PROGRESS NOTE  Brandi [name]  NWG:[id number] DOB: 05/28/1956 DOA: 11/20/2021 PCP: [name], MD   Brief Narrative: Brandi [name] is a 66 y.o. female with medical history significant of with history of diabetes mellitus type 2, hypertension, hyperlipidemia, stroke, presents the ED with a chief complaint of bilateral leg weakness.  Work-up MRI lumbar spine showed spondylosis and severe stenosis.  Neurology was consulted and was recommended to transfer to [facility] for further evaluation by neurosurgery service.  Started on Decadron.  Neurosurgery and neurology consulted.  PT/OT recommending CIR   Assessment & Plan:  * Leg weakness, bilateral- (present on admission) -CT head, MRI and MRA of brain -although limited due to motion showed no acute intracranial changes. 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  -Neurosurgery consulted following, no plan for surgical intervention for now. -Neurosurgery suspecting possible other neurological cause for paraplegia, so neurology consulted -PT/OT recommending CIR on  Discharge -Patient has mild improvement in the strength on bilateral lower extremities.  Weakness is more on the right lower extremity -Neurology ordered cervical, thoracic and lumbar CT  myelogram   Degenerative joint disease of spine- (present on admission) - Being the main problem for patient's bilateral lower extremity weakness. -Continue treatment with steroids,start tapering -Patient  transferred to [facility] for further evaluation by neurosurgery. -MRI of lumbar, thoracic and cervical spine showed widespread degenerative spondylosis, canal and foraminal stenosis.  Obesity- (present on admission) BMI of 39.5  Acute urinary retention Foley placed here.  Started on tamsulosin  History of CVA (cerebrovascular  accident) -Continue risk factor modifications -Continue the use of aspirin and statins.   -No new intracranial abnormalities appreciated on head images.   -Uses cane for ambulation  Mixed hyperlipidemia- (present on admission) -Continue statin -Heart healthy diet discussed with patient.  DM type 2 (diabetes mellitus, type 2) ([place]) -Patient currently not taking medications for diabetes -Hemoglobin A1c found to be 8.7. -Patient needs to be started on antidiabetic medications on discharge. -Continue current insulin regimen for now  Microcytic anemia- (present on admission) - Hemoglobin in the range of 8.  Iron studies showed severe iron deficiency. -Supplemented with  IV iron.  Patient also needs to be continued on oral iron therapy -No report of hematochezia or melena. 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  At that time colonoscopy and EGD were normal -She has chronic iron-deficiency anemia and her baseline hemoglobin is in the range of 8  HTN (hypertension)- (present on admission) -Continue hydralazine, lisinopril and metoprolol -Heart healthy diet discussed with patient. -Monitor blood pressure.  Currently normotensive  Hypokalemia-resolved as of 11/24/2021, (present on admission) - Supplemented and corrected              DVT prophylaxis:heparin injection 5,000 Units Start: 11/20/21 2200 SCDs Start: 11/20/21 2137     Code Status: Full Code  Family Communication: Son at bedside on 2/9  Patient status:Inpatient  Anticipated discharge to:CIR   Consultants: Neurology, neurosurgery  Procedures: None  Antimicrobials:  Anti-infectives (From admission, onward)    None       Subjective:  Patient seen and examined the bedside this morning.  Hemodynamically stable.  Same as yesterday.  No new complaints.  Weakness of the bilateral lower extremities same as yesterday  Objective: Vitals:   11/23/21  1625 11/23/21 2022 11/24/21 0414 11/24/21 0800  BP: (!) 175/84 (!) 148/66 140/70 (!) 160/75  Pulse: (!) 57 (!) 57 (!) 47 (!) 50  Resp: 19 (!) 22 18 18   Temp: 98.4 F (36.9 C) 98.8 F (37.1 C) 97.8 F (36.6 C) 98.2 F (36.8 C)  TempSrc: Oral Oral Oral   SpO2: 99% 97% 97% 97%  Weight:      Height:        Intake/Output Summary (Last 24 hours) at 11/24/2021 1054 Last data filed at 11/24/2021 0414 Gross per 24 hour  Intake 400 ml  Output 1500 ml  Net -1100 ml   Filed Weights   11/20/21 1243  Weight: 111 kg    Examination:   General exam: Overall comfortable, not in distress, morbidly obese HEENT: PERRL Respiratory system:  no wheezes or crackles  Cardiovascular system: S1 & S2 heard, RRR.  Gastrointestinal system: Abdomen is nondistended, soft and nontender. Central nervous system: Alert and oriented, paraplegia, right lower extremity weaker than the left Motor on right lower extremity: 2/5 Motor on left lower extremity: 3/5 Extremities: No edema, no clubbing ,no cyanosis Skin: No rashes, no ulcers,no icterus  GU: Foley  Data Reviewed: I have personally reviewed following labs and imaging studies  CBC: Recent Labs  Lab 11/20/21 1345 11/21/21 0348 11/22/21 0803 11/23/21 0131 11/24/21 0109  WBC 9.7 5.7 10.3 11.1* 11.2*  NEUTROABS 8.0* 5.0 8.7* 9.2* 8.1*  HGB 9.5* 8.7* 8.8* 9.0* 8.9*  HCT 33.0* 31.7* 30.3* 31.3* 31.2*  MCV 67.5* 67.6* 65.4* 66.2* 66.1*  PLT 427* 475* 463* 493* 952*   Basic Metabolic Panel: Recent Labs  Lab 11/20/21 1345 11/21/21 0348  NA 139 140  K 3.2* 3.8  CL 103 108  CO2 26 24  GLUCOSE 115* 199*  BUN 14 13  CREATININE 1.02* 0.85  CALCIUM 8.9 8.8*  MG  --  2.0     Recent Results (from the past 240 hour(s))  Resp Panel by RT-PCR (Flu A&B, Covid) Nasopharyngeal Swab     Status: None   Collection Time: 11/20/21  2:13 PM   Specimen: Nasopharyngeal Swab; Nasopharyngeal(NP) swabs in vial transport medium  Result Value Ref Range  Status   SARS Coronavirus 2 by RT PCR NEGATIVE NEGATIVE Final    Comment: (NOTE) SARS-CoV-2 target nucleic acids are NOT DETECTED.  The SARS-CoV-2 RNA is generally detectable in upper respiratory specimens during the acute phase of infection. The lowest concentration of SARS-CoV-2 viral copies this assay can detect is 138 copies/mL. A negative result does not preclude SARS-Cov-2 infection and should not be used as the sole basis for treatment or other patient management decisions. A negative result may occur with  improper specimen collection/handling, submission of specimen other than nasopharyngeal swab, presence of viral mutation(s) within the areas targeted by this assay, and inadequate number of viral copies(<138 copies/mL). A negative result must be combined with clinical observations, patient history, and epidemiological information. The expected result is Negative.  Fact Sheet for Patients:  EntrepreneurPulse.com.au  Fact Sheet for Healthcare Providers:  IncredibleEmployment.be  This test is no t yet approved or cleared by the [place] FDA and  has been authorized for detection and/or diagnosis of SARS-CoV-2 by FDA under an Emergency Use Authorization (EUA). This EUA will remain  in effect (meaning this test can be used) for the duration of the COVID-19 declaration under Section 564(b)(1) of the Act, 21 U.S.C.section 360bbb-3(b)(1), unless the authorization is terminated  or revoked sooner.       Influenza A by PCR NEGATIVE NEGATIVE  Final   Influenza B by PCR NEGATIVE NEGATIVE Final    Comment: (NOTE) The Xpert Xpress SARS-CoV-2/FLU/RSV plus assay is intended as an aid in the diagnosis of influenza from Nasopharyngeal swab specimens and should not be used as a sole basis for treatment. Nasal washings and aspirates are unacceptable for Xpert Xpress SARS-CoV-2/FLU/RSV testing.  Fact Sheet for  Patients: EntrepreneurPulse.com.au  Fact Sheet for Healthcare Providers: IncredibleEmployment.be  This test is not yet approved or cleared by the [place] FDA and has been authorized for detection and/or diagnosis of SARS-CoV-2 by FDA under an Emergency Use Authorization (EUA). This EUA will remain in effect (meaning this test can be used) for the duration of the COVID-19 declaration under Section 564(b)(1) of the Act, 21 U.S.C. section 360bbb-3(b)(1), unless the authorization is terminated or revoked.  Performed at [facility], [address], [place], [place] [address]      Radiology Studies: MR CERVICAL SPINE W WO CONTRAST  Result Date: 11/23/2021 CLINICAL DATA:  Myelopathy, acute, cervical spine. EXAM: MRI CERVICAL SPINE WITHOUT AND WITH CONTRAST TECHNIQUE: Multiplanar and multiecho pulse sequences of the cervical spine, to include the craniocervical junction and cervicothoracic junction, were obtained without and with intravenous contrast. CONTRAST:  77mL GADAVIST GADOBUTROL 1 MMOL/ML IV SOLN COMPARISON:  None. FINDINGS: The study suffers from motion degradation due to lack of patient cooperation. Alignment: No malalignment. Vertebrae: No fracture. Benign appearing fat within the T3 vertebral body. Cord: No primary cord lesion.  See below regarding stenosis. Posterior Fossa, vertebral arteries, paraspinal tissues: Negative Disc levels: The foramen magnum is widely patent. There is ordinary mild osteoarthritis of the C1-2 articulation but no encroachment upon the neural structures. C2-3: Mild bulging of the disc.  No compressive stenosis. C3-4 through C6-7: Spondylosis with endplate osteophytes and bulging of the disc at each level. Spinal stenosis at these levels with AP diameter of the canal ranging from a minimum of 5.5 mm at the C5-6 level to a maximum of 6.8 mm at C4-5, all resulting in a degree of cord deformity. Bilateral foraminal stenosis at  those levels that could affect the exiting nerves. C7-T1: Mild bulging of the disc. Mild facet hypertrophy. No compressive canal stenosis. Mild bilateral foraminal narrowing. IMPRESSION: Chronic appearing degenerative spondylosis from C3-4 through C6-7 with canal stenosis throughout that region. Minimal AP diameter is 5.5 mm at the C5-6 level. There is effacement of the subarachnoid space and deformity of the cord at those levels. There is bilateral foraminal stenosis. Bilateral foraminal stenosis at C7-T1, primarily due to encroachment by degenerative facet osteophytes. Electronically Signed   By: Nelson Chimes M.D.   On: 11/23/2021 21:17   MR THORACIC SPINE W WO CONTRAST  Result Date: 11/23/2021 CLINICAL DATA:  Myelopathy, acute, thoracic spine. EXAM: MRI THORACIC WITHOUT AND WITH CONTRAST TECHNIQUE: Multiplanar and multiecho pulse sequences of the thoracic spine were obtained without and with intravenous contrast. CONTRAST:  62mL GADAVIST GADOBUTROL 1 MMOL/ML IV SOLN COMPARISON:  11/21/2021 FINDINGS: Alignment:  Mild scoliotic curvature. Vertebrae: Benign appearing fat within the T3 vertebral body as seen previously. Apparent solid bridging anterior osteophytes from T5 to T12. Cord: Cord deformity at T3-4 and T4-5 due to stenosis. See below. Other lesser areas of encroachment. See below. Paraspinal and other soft tissues: Negative Disc levels: T1-2: Minimal bulging of the disc.  No stenosis. T2-3: Mild bulging of the disc. Mild narrowing of the ventral subarachnoid space and foramina but without likely neural compression. T3-4: Endplate osteophytes and broad-based disc herniation. Bilateral facet  degeneration and hypertrophy. Spinal stenosis with effacement of the subarachnoid space and some cord deformity at this level. Bilateral foraminal stenosis. T4-5: Endplate osteophytes and broad-based disc herniation. Bilateral facet hypertrophy. Spinal stenosis with effacement of the subarachnoid space and cord  deformity at this level. Bilateral foraminal stenosis. T5-6: Anterior bridging osteophytes. No stenosis of the canal or foramina. T6-7 and T7-8: Chronic herniated disc material with calcified disc material in the midline versus calcification of the posterior longitudinal ligament. Effacement of the subarachnoid space with ventral cord deformity at both of these levels. Findings could be significant. T8-9: Solid bridging anterior osteophytes. Minimal non-compressive disc bulge. T9-10: Bridging anterior osteophytes. Shallow left posterolateral bulge but no neural compression. T10-11: Solid bridging anterior osteophytes. Mild bulging of the disc. Bilateral facet degeneration and hypertrophy. Canal narrowing with mild cord deformity. Bilateral foraminal stenosis could affect the T10 nerves. T11-12: Bridging anterior osteophytes. Mild bulging of the disc. No stenosis. T12-L1: Disc degeneration with endplate osteophytes and bulging of the disc. Facet and ligamentous hypertrophy worse on the right. Canal stenosis with effacement of the subarachnoid space and slight deformity of the cord. IMPRESSION: Degenerative spondylosis and facet arthropathy at T3-4 and T4-5, with canal and foraminal stenosis. Cord is deformed at those levels in the patient could suffer from symptoms of myelopathy based on the findings at these levels. Apparent solid bridging osteophytes from T5-T12. Chronic extruded calcified disc material versus calcified longitudinal ligament from T6-T8, effacing the ventral subarachnoid space and indenting the ventral cord. This could be significant as well. T10-11 endplate osteophytes and bulging of the disc in combination with bilateral facet degeneration and hypertrophy. Canal narrowing with mild cord deformity. Bilateral foraminal stenosis could affect either T10 nerve. Electronically Signed   By: Nelson Chimes M.D.   On: 11/23/2021 21:27    Scheduled Meds:  aspirin  325 mg Oral Daily   Chlorhexidine  Gluconate Cloth  6 each Topical Daily   dexamethasone  4 mg Oral Q6H   ferrous sulfate  325 mg Oral Q breakfast   heparin  5,000 Units Subcutaneous Q8H   hydrALAZINE  25 mg Oral Q8H   lisinopril  20 mg Oral Daily   metoprolol tartrate  50 mg Oral BID   pantoprazole  40 mg Oral Daily   polyethylene glycol  17 g Oral Daily   senna  1 tablet Oral BID   simvastatin  20 mg Oral Daily   tamsulosin  0.4 mg Oral Daily   traZODone  150 mg Oral QHS   Continuous Infusions:  sodium chloride 10 mL/hr at 11/22/21 1151     LOS: 4 days   Shelly Coss, MD [facility] P2/07/2022, 10:54 AM

## 2021-11-24 NOTE — Progress Notes (Signed)
Discussed with radiology, will try repeat T-spine MRI instead with modifications to protocol as per radiology suggestion. This could yield information without LP required for myelogram. Also, given the spinal stenosis there is concern that she might not have good contrast distribution with the myelogram.   Roland Rack, MD [facility] [phone]  If 7pm- 7am, please page neurology on call as listed in [place]. Marland Kitchen

## 2021-11-24 NOTE — Progress Notes (Signed)
Subjective: Feels that she continues to improve.   Exam: Vitals:   11/24/21 0414 11/24/21 0800  BP: 140/70 (!) 160/75  Pulse: (!) 47 (!) 50  Resp: 18 18  Temp: 97.8 F (36.6 C) 98.2 F (36.8 C)  SpO2: 97% 97%   Gen: In bed, NAD Resp: non-labored breathing, no acute distress Abd: soft, nt  Neuro: MS: awake, alert.  HE:KBTC, face symmetric Motor: intact in UE, unchanged LE weakness Sensory:She has a sensory level to temperature in the mid-thoracic region.    Pertinent Labs: [id number]  Impression: 66 yo F with rapid LE weakness. With her preserved reflexes, doubt lumbar stenosis as etiology. 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 I discussed with Dr Ronnald Ramp who is recommending a myelogram.    Recommendations: 1) CT myelogram of the C, T, L-spine   Roland Rack, MD [facility] [phone]  If 7pm- 7am, please page neurology on call as listed in [place].

## 2021-11-25 DIAGNOSIS — D72829 Elevated white blood cell count, unspecified: Secondary | ICD-10-CM

## 2021-11-25 LAB — CBC WITH DIFFERENTIAL/PLATELET
Abs Immature Granulocytes: 0 10*3/uL (ref 0.00–0.07)
Basophils Absolute: 0 10*3/uL (ref 0.0–0.1)
Basophils Relative: 0 %
Eosinophils Absolute: 0 10*3/uL (ref 0.0–0.5)
Eosinophils Relative: 0 %
HCT: 33.6 % — ABNORMAL LOW (ref 36.0–46.0)
Hemoglobin: 9.8 g/dL — ABNORMAL LOW (ref 12.0–15.0)
Lymphocytes Relative: 20 %
Lymphs Abs: 2.6 10*3/uL (ref 0.7–4.0)
MCH: 19.4 pg — ABNORMAL LOW (ref 26.0–34.0)
MCHC: 29.2 g/dL — ABNORMAL LOW (ref 30.0–36.0)
MCV: 66.7 fL — ABNORMAL LOW (ref 80.0–100.0)
Monocytes Absolute: 0.9 10*3/uL (ref 0.1–1.0)
Monocytes Relative: 7 %
Neutro Abs: 9.6 10*3/uL — ABNORMAL HIGH (ref 1.7–7.7)
Neutrophils Relative %: 73 %
Platelets: 505 10*3/uL — ABNORMAL HIGH (ref 150–400)
RBC: 5.04 MIL/uL (ref 3.87–5.11)
RDW: 22.4 % — ABNORMAL HIGH (ref 11.5–15.5)
WBC: 13.2 10*3/uL — ABNORMAL HIGH (ref 4.0–10.5)
nRBC: 2.1 % — ABNORMAL HIGH (ref 0.0–0.2)
nRBC: 7 /100 WBC — ABNORMAL HIGH

## 2021-11-25 LAB — BASIC METABOLIC PANEL
Anion gap: 10 (ref 5–15)
BUN: 25 mg/dL — ABNORMAL HIGH (ref 8–23)
CO2: 23 mmol/L (ref 22–32)
Calcium: 8.7 mg/dL — ABNORMAL LOW (ref 8.9–10.3)
Chloride: 104 mmol/L (ref 98–111)
Creatinine, Ser: 1.06 mg/dL — ABNORMAL HIGH (ref 0.44–1.00)
GFR, Estimated: 58 mL/min — ABNORMAL LOW (ref >60)
Glucose, Bld: 136 mg/dL — ABNORMAL HIGH (ref 70–99)
Potassium: 4.3 mmol/L (ref 3.5–5.1)
Sodium: 137 mmol/L (ref 135–145)

## 2021-11-25 LAB — GLUCOSE, CAPILLARY
Glucose-Capillary: 133 mg/dL — ABNORMAL HIGH (ref 70–99)
Glucose-Capillary: 152 mg/dL — ABNORMAL HIGH (ref 70–99)
Glucose-Capillary: 162 mg/dL — ABNORMAL HIGH (ref 70–99)
Glucose-Capillary: 166 mg/dL — ABNORMAL HIGH (ref 70–99)

## 2021-11-25 MED ORDER — DEXAMETHASONE 4 MG PO TABS
4.0000 mg | ORAL_TABLET | Freq: Three times a day (TID) | ORAL | Status: DC
  Administered 2021-11-25 – 2021-11-26 (×3): 4 mg via ORAL
  Filled 2021-11-25 (×3): qty 1

## 2021-11-25 NOTE — Progress Notes (Signed)
Subjective: The patient is alert and pleasant.  She is in no apparent distress.  She says she is "better" today.  Objective: Vital signs in last 24 hours: Temp:  [98 F (36.7 C)-99 F (37.2 C)] 99 F (37.2 C) (02/11 1204) Pulse Rate:  [47-60] 51 (02/11 1204) Resp:  [16-20] 20 (02/11 1204) BP: (127-152)/(54-83) 127/71 (02/11 1204) SpO2:  [96 %-100 %] 100 % (02/11 1204) Estimated body mass index is 39.5 kg/m as calculated from the following:   Height as of this encounter: 5\' 6"  (1.676 m).   Weight as of this encounter: 111 kg.   Intake/Output from previous day: 02/10 0701 - 02/11 0700 In: 240 [P.O.:240] Out: 2060 [Urine:2060] Intake/Output this shift: Total I/O In: 120 [P.O.:120] Out: -   Physical exam the patient is alert and pleasant.  Her lower extremity strength is as follows: She has approximately 2-3 /5 bilateral iliopsoas strength.  Her quadricep and gastrocnemius strength is approximately 4-/5 bilaterally.  Her left dorsi is flexor strength is 4-/5.  Her right dorsiflexor strength is approximately 2-3/5.  Lab Results: Recent Labs    11/24/21 0109 11/25/21 0130  WBC 11.2* 13.2*  HGB 8.9* 9.8*  HCT 31.2* 33.6*  PLT 494* 505*   BMET Recent Labs    11/25/21 0130  NA 137  K 4.3  CL 104  CO2 23  GLUCOSE 136*  BUN 25*  CREATININE 1.06*  CALCIUM 8.7*    Studies/Results: MR THORACIC SPINE WO CONTRAST  Result Date: 11/24/2021 CLINICAL DATA:  Provided history: Myelopathy, acute, thoracic spine. EXAM: MRI THORACIC SPINE WITHOUT CONTRAST TECHNIQUE: Multiplanar, multisequence MR imaging of the thoracic spine was performed. No intravenous contrast was administered. COMPARISON:  Prior thoracic spine MRI examinations 11/23/2021 and 11/21/2021. FINDINGS: A problem-oriented repeat thoracic spine MRI was performed to better assess sites of stenosis within the upper thoracic spinal canal. The current examination spans the C6-T10 levels on sagittal imaging. The T2-T3, T3-T4,  T4-T5 and T5-T6 levels are included on axial imaging. In addition to routine sequences, multiple high-resolution 3D T2-weighted sequences were also performed. Alignment:  No significant spondylolisthesis. Vertebrae: No thoracic vertebral compression fracture. Multilevel degenerative endplate irregularity, greatest at T3-T4 and T4-T5. Apparent solid bridging anterior osteophytes at the T5-T6, T6-T7, T7-T8 and T9-T10 levels. 2.2 cm hemangioma within the right aspect of the T3 vertebral body. Cord: No definite spinal cord signal abnormality is appreciated. Spinal cord flattening at T3-T4 and T4-T5, as described below. Paraspinal and other soft tissues: No abnormality identified within included portions of the thorax. Paraspinal soft tissues unremarkable. Disc levels: Multilevel disc space narrowing. Most notably, disc space narrowing is advanced at T2-T3, T3-T4, T5-T6, T6-T7, T7-T8, T8-T9 and T9-T10. Also of note, disc degeneration is moderate/advanced at T4-T5. T1-T2: Imaged sagittally. Not included on axial imaging. Disc bulge with endplate spurring. Superimposed small left center disc protrusion. No more than mild spinal canal stenosis is suspected. No compressive foraminal narrowing. T2-T3: Disc bulge. Facet arthrosis. The disc bulge mildly effaces the ventral thecal sac and may contact the ventral spinal cord. No compressive foraminal stenosis. T3-T4: Disc bulge with endplate spurring/osteophytic ridging. Facet arthrosis. Moderate to moderately severe spinal canal stenosis with contact upon the dorsal and ventral spinal cord and mild spinal cord flattening. Bilateral neural foraminal narrowing (moderate right, mild left). T4-T5: Disc bulge with endplate spurring/osteophytic ridging. Facet arthrosis. Moderate to moderately severe spinal canal stenosis with contact upon the dorsal and ventral spinal cord and mild spinal cord flattening. Mild/moderate bilateral neural foraminal narrowing. T5-T6:  Anterior bridging  osteophyte. Facet arthrosis. No significant spinal canal or foraminal stenosis. The T6-T7 and more caudal thoracic levels are not included on axial imaging. Please refer to the recent prior thoracic spine MRI of 11/23/2021 for a description of thoracic spondylosis at these more caudal levels. IMPRESSION: Problem-oriented MRI to better characterize sites of spinal canal stenosis within the upper thoracic spine, as described. Upper thoracic spondylosis, as outlined and with findings most notably as follows. At T3-T4, there is a disc bulge with osteophytic ridging. Facet arthrosis. Resultant multifactorial moderate to moderately severe spinal canal stenosis, with contact upon the dorsal and ventral spinal cord, and mild spinal cord flattening. Bilateral neural foraminal narrowing (moderate right, mild left). At T4-T5, there is a disc bulge with osteophytic ridging. Facet arthrosis. Resultant multifactorial moderate to moderately severe spinal canal stenosis, with contact upon the dorsal and ventral spinal cord, and mild spinal cord flattening. Mild/moderate bilateral neural foraminal narrowing. No more than mild spinal canal stenosis, and no compressive foraminal stenosis, at the remaining upper thoracic levels. Apparent solid bridging anterior osteophytes at the T5-T6 through T9-T10 levels. Additional bridging anterior osteophytes were identified more inferiorly within the thoracic spine on the recent prior thoracic spine MRI of 11/23/2021. Electronically Signed   By: Kellie Simmering D.O.   On: 11/24/2021 17:22   MR CERVICAL SPINE W WO CONTRAST  Result Date: 11/23/2021 CLINICAL DATA:  Myelopathy, acute, cervical spine. EXAM: MRI CERVICAL SPINE WITHOUT AND WITH CONTRAST TECHNIQUE: Multiplanar and multiecho pulse sequences of the cervical spine, to include the craniocervical junction and cervicothoracic junction, were obtained without and with intravenous contrast. CONTRAST:  6mL GADAVIST GADOBUTROL 1 MMOL/ML IV SOLN  COMPARISON:  None. FINDINGS: The study suffers from motion degradation due to lack of patient cooperation. Alignment: No malalignment. Vertebrae: No fracture. Benign appearing fat within the T3 vertebral body. Cord: No primary cord lesion.  See below regarding stenosis. Posterior Fossa, vertebral arteries, paraspinal tissues: Negative Disc levels: The foramen magnum is widely patent. There is ordinary mild osteoarthritis of the C1-2 articulation but no encroachment upon the neural structures. C2-3: Mild bulging of the disc.  No compressive stenosis. C3-4 through C6-7: Spondylosis with endplate osteophytes and bulging of the disc at each level. Spinal stenosis at these levels with AP diameter of the canal ranging from a minimum of 5.5 mm at the C5-6 level to a maximum of 6.8 mm at C4-5, all resulting in a degree of cord deformity. Bilateral foraminal stenosis at those levels that could affect the exiting nerves. C7-T1: Mild bulging of the disc. Mild facet hypertrophy. No compressive canal stenosis. Mild bilateral foraminal narrowing. IMPRESSION: Chronic appearing degenerative spondylosis from C3-4 through C6-7 with canal stenosis throughout that region. Minimal AP diameter is 5.5 mm at the C5-6 level. There is effacement of the subarachnoid space and deformity of the cord at those levels. There is bilateral foraminal stenosis. Bilateral foraminal stenosis at C7-T1, primarily due to encroachment by degenerative facet osteophytes. Electronically Signed   By: Nelson Chimes M.D.   On: 11/23/2021 21:17   MR THORACIC SPINE W WO CONTRAST  Result Date: 11/23/2021 CLINICAL DATA:  Myelopathy, acute, thoracic spine. EXAM: MRI THORACIC WITHOUT AND WITH CONTRAST TECHNIQUE: Multiplanar and multiecho pulse sequences of the thoracic spine were obtained without and with intravenous contrast. CONTRAST:  6mL GADAVIST GADOBUTROL 1 MMOL/ML IV SOLN COMPARISON:  11/21/2021 FINDINGS: Alignment:  Mild scoliotic curvature. Vertebrae:  Benign appearing fat within the T3 vertebral body as seen previously. Apparent solid bridging anterior  osteophytes from T5 to T12. Cord: Cord deformity at T3-4 and T4-5 due to stenosis. See below. Other lesser areas of encroachment. See below. Paraspinal and other soft tissues: Negative Disc levels: T1-2: Minimal bulging of the disc.  No stenosis. T2-3: Mild bulging of the disc. Mild narrowing of the ventral subarachnoid space and foramina but without likely neural compression. T3-4: Endplate osteophytes and broad-based disc herniation. Bilateral facet degeneration and hypertrophy. Spinal stenosis with effacement of the subarachnoid space and some cord deformity at this level. Bilateral foraminal stenosis. T4-5: Endplate osteophytes and broad-based disc herniation. Bilateral facet hypertrophy. Spinal stenosis with effacement of the subarachnoid space and cord deformity at this level. Bilateral foraminal stenosis. T5-6: Anterior bridging osteophytes. No stenosis of the canal or foramina. T6-7 and T7-8: Chronic herniated disc material with calcified disc material in the midline versus calcification of the posterior longitudinal ligament. Effacement of the subarachnoid space with ventral cord deformity at both of these levels. Findings could be significant. T8-9: Solid bridging anterior osteophytes. Minimal non-compressive disc bulge. T9-10: Bridging anterior osteophytes. Shallow left posterolateral bulge but no neural compression. T10-11: Solid bridging anterior osteophytes. Mild bulging of the disc. Bilateral facet degeneration and hypertrophy. Canal narrowing with mild cord deformity. Bilateral foraminal stenosis could affect the T10 nerves. T11-12: Bridging anterior osteophytes. Mild bulging of the disc. No stenosis. T12-L1: Disc degeneration with endplate osteophytes and bulging of the disc. Facet and ligamentous hypertrophy worse on the right. Canal stenosis with effacement of the subarachnoid space and slight  deformity of the cord. IMPRESSION: Degenerative spondylosis and facet arthropathy at T3-4 and T4-5, with canal and foraminal stenosis. Cord is deformed at those levels in the patient could suffer from symptoms of myelopathy based on the findings at these levels. Apparent solid bridging osteophytes from T5-T12. Chronic extruded calcified disc material versus calcified longitudinal ligament from T6-T8, effacing the ventral subarachnoid space and indenting the ventral cord. This could be significant as well. T10-11 endplate osteophytes and bulging of the disc in combination with bilateral facet degeneration and hypertrophy. Canal narrowing with mild cord deformity. Bilateral foraminal stenosis could affect either T10 nerve. Electronically Signed   By: Nelson Chimes M.D.   On: 11/23/2021 21:27    Assessment/Plan: Bilateral lower extremity weakness: She has multilevel spinal stenosis.  She appears to have some signal change from approximately T2-T4.  I wonder if this is secondary to ischemia or possibly transverse myelitis.  In any event she seems to be improving and is in no need of urgent surgery.  LOS: 5 days     [name] 11/25/2021, 12:22 PM     Patient ID: Brandi [name], female   DOB: 1955-10-26, 66 y.o.   MRN: [id number]

## 2021-11-25 NOTE — Assessment & Plan Note (Signed)
This is likely from steroids.  Continue to monitor

## 2021-11-25 NOTE — Progress Notes (Signed)
PROGRESS NOTE  Brandi [name]  BPZ:[id number] DOB: 1956-06-01 DOA: 11/20/2021 PCP: [name], MD   Brief Narrative: Brandi [name] is a 66 y.o. female with medical history significant of with history of diabetes mellitus type 2, hypertension, hyperlipidemia, stroke, presents the ED with a chief complaint of bilateral leg weakness.  Work-up MRI lumbar spine showed spondylosis and severe stenosis.  Neurology was consulted and was recommended to transfer to [facility] for further evaluation by neurosurgery service.  Started on Decadron.  Neurosurgery and neurology consulted.  PT/OT recommending CIR   Assessment & Plan:  * Leg weakness, bilateral- (present on admission) -CT head, MRI and MRA of brain -although limited due to motion showed no acute intracranial changes. 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  -Neurosurgery consulted following, no plan for surgical intervention for now. -Neurosurgery suspecting possible other neurological cause for paraplegia, so neurology consulted -PT/OT recommending CIR on  Discharge -Patient has mild improvement in the strength on bilateral lower extremities.  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 -Continue treatment with steroids,start tapering -Patient  transferred to [facility] for further evaluation by neurosurgery. -MRI of lumbar, thoracic and cervical spine showed widespread degenerative spondylosis, canal and foraminal stenosis. At T3-T4,T4-T5. there is a disc bulge with osteophytic ridging with moderate to severe canal stenosis  Obesity- (present on admission) BMI of 39.5  Leucocytosis This is likely from steroids.  Continue to monitor  Acute  urinary retention Foley placed here.  Started on tamsulosin  History of CVA (cerebrovascular accident) -Continue risk factor modifications -Continue the use of aspirin and statins.   -No new intracranial abnormalities appreciated on head images.   -Uses cane for ambulation  Mixed hyperlipidemia- (present on admission) -Continue statin -Heart healthy diet discussed with patient.  DM type 2 (diabetes mellitus, type 2) (Brandi [name]) -Patient currently not taking medications for diabetes -Hemoglobin A1c found to be 8.7. -Patient needs to be started on antidiabetic medications on discharge. -Continue current insulin regimen for now  Microcytic anemia- (present on admission) - Hemoglobin in the range of 8.  Iron studies showed severe iron deficiency. -Supplemented with  IV iron.  Patient also needs to be continued on oral iron therapy -No report of hematochezia or melena. 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  At that time colonoscopy and EGD were normal -She has chronic iron-deficiency anemia and her baseline hemoglobin is in the range of 8  HTN (hypertension)- (present on admission) -Continue hydralazine, lisinopril and metoprolol -Heart healthy diet discussed with patient. -Monitor blood pressure.  Currently normotensive  Hypokalemia-resolved as of 11/24/2021, (present on admission) - Supplemented and corrected              DVT prophylaxis:heparin injection 5,000 Units Start: 11/20/21 2200 SCDs Start: 11/20/21 2137     Code Status: Full Code  Family Communication: Son on phone on 2/11  Patient status:Inpatient  Anticipated discharge to:CIR   Consultants: Neurology, neurosurgery  Procedures: None  Antimicrobials:  Anti-infectives (From admission, onward)    None       Subjective:  Patient seen and examined at the bedside this morning.  Hemodynamically stable.  Eating her breakfast.  Weakness of the  bilateral lower extremities are same  like yesterday.  Denies any back pain.  No new complaints  Objective: Vitals:   11/24/21 2142 11/25/21 0006 11/25/21 0453 11/25/21 0802  BP: (!) 146/64 (!) 142/64 139/76 (!) 140/54  Pulse: (!) 47 60 (!) 55 (!) 51  Resp: 18 16 17 18   Temp:  98.3 F (36.8 C) 98.1 F (36.7 C) 98.2 F (36.8 C)  TempSrc:  Oral Oral Oral  SpO2: 99% 96% 98% 98%  Weight:      Height:        Intake/Output Summary (Last 24 hours) at 11/25/2021 1010 Last data filed at 11/25/2021 0753 Gross per 24 hour  Intake 360 ml  Output 2060 ml  Net -1700 ml   Filed Weights   11/20/21 1243  Weight: 111 kg    Examination:   General exam: Overall comfortable, not in distress, morbidly obese HEENT: PERRL Respiratory system:  no wheezes or crackles  Cardiovascular system: S1 & S2 heard, RRR.  Gastrointestinal system: Abdomen is nondistended, soft and nontender. Central nervous system: Alert and oriented, paraplegia Right lower extremity weaker than the left Extremities: No edema, no clubbing ,no cyanosis Skin: No rashes, no ulcers,no icterus    Data Reviewed: I have personally reviewed following labs and imaging studies  CBC: Recent Labs  Lab 11/21/21 0348 11/22/21 0803 11/23/21 0131 11/24/21 0109 11/25/21 0130  WBC 5.7 10.3 11.1* 11.2* 13.2*  NEUTROABS 5.0 8.7* 9.2* 8.1* 9.6*  HGB 8.7* 8.8* 9.0* 8.9* 9.8*  HCT 31.7* 30.3* 31.3* 31.2* 33.6*  MCV 67.6* 65.4* 66.2* 66.1* 66.7*  PLT 475* 463* 493* 494* 073*   Basic Metabolic Panel: Recent Labs  Lab 11/20/21 1345 11/21/21 0348 11/25/21 0130  NA 139 140 137  K 3.2* 3.8 4.3  CL 103 108 104  CO2 26 24 23   GLUCOSE 115* 199* 136*  BUN 14 13 25*  CREATININE 1.02* 0.85 1.06*  CALCIUM 8.9 8.8* 8.7*  MG  --  2.0  --      Recent Results (from the past 240 hour(s))  Resp Panel by RT-PCR (Flu A&B, Covid) Nasopharyngeal Swab     Status: None   Collection Time: 11/20/21  2:13 PM   Specimen: Nasopharyngeal Swab;  Nasopharyngeal(NP) swabs in vial transport medium  Result Value Ref Range Status   SARS Coronavirus 2 by RT PCR NEGATIVE NEGATIVE Final    Comment: (NOTE) SARS-CoV-2 target nucleic acids are NOT DETECTED.  The SARS-CoV-2 RNA is generally detectable in upper respiratory specimens during the acute phase of infection. The lowest concentration of SARS-CoV-2 viral copies this assay can detect is 138 copies/mL. A negative result does not preclude SARS-Cov-2 infection and should not be used as the sole basis for treatment or other patient management decisions. A negative result may occur with  improper specimen collection/handling, submission of specimen other than nasopharyngeal swab, presence of viral mutation(s) within the areas targeted by this assay, and inadequate number of viral copies(<138 copies/mL). A negative result must be combined with clinical observations, patient history, and epidemiological information. The expected result is Negative.  Fact Sheet for Patients:  EntrepreneurPulse.com.au  Fact Sheet for Healthcare Providers:  IncredibleEmployment.be  This test is no t yet approved or cleared by the [place] FDA and  has been authorized for detection and/or diagnosis of SARS-CoV-2 by FDA under an Emergency Use Authorization (EUA). This EUA will remain  in effect (meaning this test can be used) for the duration of the COVID-19 declaration under Section 564(b)(1) of the Act, 21 U.S.C.section 360bbb-3(b)(1), unless  the authorization is terminated  or revoked sooner.       Influenza A by PCR NEGATIVE NEGATIVE Final   Influenza B by PCR NEGATIVE NEGATIVE Final    Comment: (NOTE) The Xpert Xpress SARS-CoV-2/FLU/RSV plus assay is intended as an aid in the diagnosis of influenza from Nasopharyngeal swab specimens and should not be used as a sole basis for treatment. Nasal washings and aspirates are unacceptable for Xpert Xpress  SARS-CoV-2/FLU/RSV testing.  Fact Sheet for Patients: EntrepreneurPulse.com.au  Fact Sheet for Healthcare Providers: IncredibleEmployment.be  This test is not yet approved or cleared by the [place] FDA and has been authorized for detection and/or diagnosis of SARS-CoV-2 by FDA under an Emergency Use Authorization (EUA). This EUA will remain in effect (meaning this test can be used) for the duration of the COVID-19 declaration under Section 564(b)(1) of the Act, 21 U.S.C. section 360bbb-3(b)(1), unless the authorization is terminated or revoked.  Performed at [facility], [address], [place], [place] [address]      Radiology Studies: MR THORACIC SPINE WO CONTRAST  Result Date: 11/24/2021 CLINICAL DATA:  Provided history: Myelopathy, acute, thoracic spine. EXAM: MRI THORACIC SPINE WITHOUT CONTRAST TECHNIQUE: Multiplanar, multisequence MR imaging of the thoracic spine was performed. No intravenous contrast was administered. COMPARISON:  Prior thoracic spine MRI examinations 11/23/2021 and 11/21/2021. FINDINGS: A problem-oriented repeat thoracic spine MRI was performed to better assess sites of stenosis within the upper thoracic spinal canal. The current examination spans the C6-T10 levels on sagittal imaging. The T2-T3, T3-T4, T4-T5 and T5-T6 levels are included on axial imaging. In addition to routine sequences, multiple high-resolution 3D T2-weighted sequences were also performed. Alignment:  No significant spondylolisthesis. Vertebrae: No thoracic vertebral compression fracture. Multilevel degenerative endplate irregularity, greatest at T3-T4 and T4-T5. Apparent solid bridging anterior osteophytes at the T5-T6, T6-T7, T7-T8 and T9-T10 levels. 2.2 cm hemangioma within the right aspect of the T3 vertebral body. Cord: No definite spinal cord signal abnormality is appreciated. Spinal cord flattening at T3-T4 and T4-T5, as described below. Paraspinal  and other soft tissues: No abnormality identified within included portions of the thorax. Paraspinal soft tissues unremarkable. Disc levels: Multilevel disc space narrowing. Most notably, disc space narrowing is advanced at T2-T3, T3-T4, T5-T6, T6-T7, T7-T8, T8-T9 and T9-T10. Also of note, disc degeneration is moderate/advanced at T4-T5. T1-T2: Imaged sagittally. Not included on axial imaging. Disc bulge with endplate spurring. Superimposed small left center disc protrusion. No more than mild spinal canal stenosis is suspected. No compressive foraminal narrowing. T2-T3: Disc bulge. Facet arthrosis. The disc bulge mildly effaces the ventral thecal sac and may contact the ventral spinal cord. No compressive foraminal stenosis. T3-T4: Disc bulge with endplate spurring/osteophytic ridging. Facet arthrosis. Moderate to moderately severe spinal canal stenosis with contact upon the dorsal and ventral spinal cord and mild spinal cord flattening. Bilateral neural foraminal narrowing (moderate right, mild left). T4-T5: Disc bulge with endplate spurring/osteophytic ridging. Facet arthrosis. Moderate to moderately severe spinal canal stenosis with contact upon the dorsal and ventral spinal cord and mild spinal cord flattening. Mild/moderate bilateral neural foraminal narrowing. T5-T6: Anterior bridging osteophyte. Facet arthrosis. No significant spinal canal or foraminal stenosis. The T6-T7 and more caudal thoracic levels are not included on axial imaging. Please refer to the recent prior thoracic spine MRI of 11/23/2021 for a description of thoracic spondylosis at these more caudal levels. IMPRESSION: Problem-oriented MRI to better characterize sites of spinal canal stenosis within the upper thoracic spine, as described. Upper thoracic spondylosis, as outlined and  with findings most notably as follows. At T3-T4, there is a disc bulge with osteophytic ridging. Facet arthrosis. Resultant multifactorial moderate to moderately  severe spinal canal stenosis, with contact upon the dorsal and ventral spinal cord, and mild spinal cord flattening. Bilateral neural foraminal narrowing (moderate right, mild left). At T4-T5, there is a disc bulge with osteophytic ridging. Facet arthrosis. Resultant multifactorial moderate to moderately severe spinal canal stenosis, with contact upon the dorsal and ventral spinal cord, and mild spinal cord flattening. Mild/moderate bilateral neural foraminal narrowing. No more than mild spinal canal stenosis, and no compressive foraminal stenosis, at the remaining upper thoracic levels. Apparent solid bridging anterior osteophytes at the T5-T6 through T9-T10 levels. Additional bridging anterior osteophytes were identified more inferiorly within the thoracic spine on the recent prior thoracic spine MRI of 11/23/2021. Electronically Signed   By: Kellie Simmering D.O.   On: 11/24/2021 17:22   MR CERVICAL SPINE W WO CONTRAST  Result Date: 11/23/2021 CLINICAL DATA:  Myelopathy, acute, cervical spine. EXAM: MRI CERVICAL SPINE WITHOUT AND WITH CONTRAST TECHNIQUE: Multiplanar and multiecho pulse sequences of the cervical spine, to include the craniocervical junction and cervicothoracic junction, were obtained without and with intravenous contrast. CONTRAST:  54mL GADAVIST GADOBUTROL 1 MMOL/ML IV SOLN COMPARISON:  None. FINDINGS: The study suffers from motion degradation due to lack of patient cooperation. Alignment: No malalignment. Vertebrae: No fracture. Benign appearing fat within the T3 vertebral body. Cord: No primary cord lesion.  See below regarding stenosis. Posterior Fossa, vertebral arteries, paraspinal tissues: Negative Disc levels: The foramen magnum is widely patent. There is ordinary mild osteoarthritis of the C1-2 articulation but no encroachment upon the neural structures. C2-3: Mild bulging of the disc.  No compressive stenosis. C3-4 through C6-7: Spondylosis with endplate osteophytes and bulging of the  disc at each level. Spinal stenosis at these levels with AP diameter of the canal ranging from a minimum of 5.5 mm at the C5-6 level to a maximum of 6.8 mm at C4-5, all resulting in a degree of cord deformity. Bilateral foraminal stenosis at those levels that could affect the exiting nerves. C7-T1: Mild bulging of the disc. Mild facet hypertrophy. No compressive canal stenosis. Mild bilateral foraminal narrowing. IMPRESSION: Chronic appearing degenerative spondylosis from C3-4 through C6-7 with canal stenosis throughout that region. Minimal AP diameter is 5.5 mm at the C5-6 level. There is effacement of the subarachnoid space and deformity of the cord at those levels. There is bilateral foraminal stenosis. Bilateral foraminal stenosis at C7-T1, primarily due to encroachment by degenerative facet osteophytes. Electronically Signed   By: Nelson Chimes M.D.   On: 11/23/2021 21:17   MR THORACIC SPINE W WO CONTRAST  Result Date: 11/23/2021 CLINICAL DATA:  Myelopathy, acute, thoracic spine. EXAM: MRI THORACIC WITHOUT AND WITH CONTRAST TECHNIQUE: Multiplanar and multiecho pulse sequences of the thoracic spine were obtained without and with intravenous contrast. CONTRAST:  62mL GADAVIST GADOBUTROL 1 MMOL/ML IV SOLN COMPARISON:  11/21/2021 FINDINGS: Alignment:  Mild scoliotic curvature. Vertebrae: Benign appearing fat within the T3 vertebral body as seen previously. Apparent solid bridging anterior osteophytes from T5 to T12. Cord: Cord deformity at T3-4 and T4-5 due to stenosis. See below. Other lesser areas of encroachment. See below. Paraspinal and other soft tissues: Negative Disc levels: T1-2: Minimal bulging of the disc.  No stenosis. T2-3: Mild bulging of the disc. Mild narrowing of the ventral subarachnoid space and foramina but without likely neural compression. T3-4: Endplate osteophytes and broad-based disc herniation. Bilateral facet degeneration and  hypertrophy. Spinal stenosis with effacement of the  subarachnoid space and some cord deformity at this level. Bilateral foraminal stenosis. T4-5: Endplate osteophytes and broad-based disc herniation. Bilateral facet hypertrophy. Spinal stenosis with effacement of the subarachnoid space and cord deformity at this level. Bilateral foraminal stenosis. T5-6: Anterior bridging osteophytes. No stenosis of the canal or foramina. T6-7 and T7-8: Chronic herniated disc material with calcified disc material in the midline versus calcification of the posterior longitudinal ligament. Effacement of the subarachnoid space with ventral cord deformity at both of these levels. Findings could be significant. T8-9: Solid bridging anterior osteophytes. Minimal non-compressive disc bulge. T9-10: Bridging anterior osteophytes. Shallow left posterolateral bulge but no neural compression. T10-11: Solid bridging anterior osteophytes. Mild bulging of the disc. Bilateral facet degeneration and hypertrophy. Canal narrowing with mild cord deformity. Bilateral foraminal stenosis could affect the T10 nerves. T11-12: Bridging anterior osteophytes. Mild bulging of the disc. No stenosis. T12-L1: Disc degeneration with endplate osteophytes and bulging of the disc. Facet and ligamentous hypertrophy worse on the right. Canal stenosis with effacement of the subarachnoid space and slight deformity of the cord. IMPRESSION: Degenerative spondylosis and facet arthropathy at T3-4 and T4-5, with canal and foraminal stenosis. Cord is deformed at those levels in the patient could suffer from symptoms of myelopathy based on the findings at these levels. Apparent solid bridging osteophytes from T5-T12. Chronic extruded calcified disc material versus calcified longitudinal ligament from T6-T8, effacing the ventral subarachnoid space and indenting the ventral cord. This could be significant as well. T10-11 endplate osteophytes and bulging of the disc in combination with bilateral facet degeneration and hypertrophy.  Canal narrowing with mild cord deformity. Bilateral foraminal stenosis could affect either T10 nerve. Electronically Signed   By: Nelson Chimes M.D.   On: 11/23/2021 21:27    Scheduled Meds:  aspirin  325 mg Oral Daily   Chlorhexidine Gluconate Cloth  6 each Topical Daily   dexamethasone  4 mg Oral Q8H   ferrous sulfate  325 mg Oral Q breakfast   heparin  5,000 Units Subcutaneous Q8H   hydrALAZINE  25 mg Oral Q8H   lisinopril  20 mg Oral Daily   metoprolol tartrate  50 mg Oral BID   pantoprazole  40 mg Oral Daily   polyethylene glycol  17 g Oral Daily   senna  1 tablet Oral BID   simvastatin  20 mg Oral Daily   tamsulosin  0.4 mg Oral Daily   traZODone  150 mg Oral QHS   Continuous Infusions:  sodium chloride 10 mL/hr at 11/22/21 1151     LOS: 5 days   Shelly Coss, MD [facility] P2/08/2022, 10:10 AM

## 2021-11-25 NOTE — PMR Pre-admission (Signed)
PMR Admission Coordinator Pre-Admission Assessment  Patient: Brandi [name] is an 66 y.o., female MRN: [id number] DOB: Nov 02, 1955 Height: $RemoveBefo'5\' 6"'SsLzAvlXmcv$  (167.6 cm) Weight: 111 kg  Insurance Information HMO: Yes - POS    PPO:      PCP:      IPA:      80/20:      OTHER: Group [id number] PRIMARY: U[employer] Medicare      Policy#: [id number]      Subscriber: self CM Name:       Phone#:      Fax#: [phone] Pre-Cert#: [id number] denial overturned and Approved on 11/30/21      Employer: Not employed Benefits:  Phone #: [phone]     Name: [email or web address] online Eff. Date: 10/15/21     Deduct: 73      Out of Pocket Max: $4500      Life Max: N/A CIR: $325 for days 1-5      SNF: $0 days 1-20; $196 days 21-43; $0 days 44-100 Outpatient: med nec     Co-Pay: $20 copay/visit Home Health: 100%      Co-Pay: none DME: 80%     Co-Pay: 20% Providers: in network  SECONDARY:       Policy#:      Phone#:   Development worker, community:       Phone#:   [facility] [employer] for patients in Inpatient Rehabilitation Facilities with attached Privacy Act [place] Records was provided and verbally reviewed with: Patient  Emergency Contact Information Contact Information     Name Relation Home Work [place] Son [phone]  [phone]   Sherre Poot   [phone]       Current Medical History  Patient Admitting Diagnosis:  LE Leg weakness  History of Present Illness: A 66 y.o. female with a medical history significant for morbid obesity with a BMI of 39.5 kg/m2, anemia, type 2 diabetes mellitus, stroke with some reported residual right hand weakness, and essential hypertension who presented to [facility] ED on 2/6 for bilateral lower extremity weakness.  Patient endorses that about 2 weeks ago she had tingling in her left foot with "nerve pain" that has been intermittent since.  In her usual state of health, patient is able to ambulate in her home using a cane to  use the restroom independently.  Patient's son states that on Monday 2/6 he was attempting to give her a bath on the side of the bed and noticed that she had bilateral leg numbness and weakness in addition to being unable to sit up on her own with imbalance.  He states that she started to slip off the bed and "her legs were like spaghetti" and she was unable to support her weight and slid to the ground prompting him to call EMS.  Patient's son reports that she has had back problems for many years and has also had knee issues for at least 18 years but denies any history of similar episodes of lower extremity weakness and numbness and notes that she does use a cane for ambulation at home.  Patient was given steroids during hospitalization with reported improvement in her lower extremity numbness and sensory deficits with reported residual left foot nerve pain and right lower extremity weakness. Further work up reveals imaging significant for severe spinal stenosis at L3-4 and L4-5. PT/OT evaluations were completed with recommendations for acute inpatient rehab admission.  Patient's medical record from [facility] has been reviewed by the rehabilitation admission coordinator  and physician.  Past Medical History  Past Medical History:  Diagnosis Date   Anemia    Diabetes mellitus without complication ([place])    Hypertension    Stroke [facility]) 2013    Has the patient had major surgery during 100 days prior to admission? No  Family History   family history includes Heart attack in her father; Ulcers in her mother.  Current Medications  Current Facility-Administered Medications:    0.9 %  sodium chloride infusion, , Intravenous, PRN, Shelly Coss, MD, Last Rate: 10 mL/hr at 11/22/21 1151, New Bag at 11/22/21 1151   acetaminophen (TYLENOL) tablet 650 mg, 650 mg, Oral, Q6H PRN, 650 mg at 11/29/21 1438 **OR** acetaminophen (TYLENOL) suppository 650 mg, 650 mg, Rectal, Q6H PRN, Zierle-Ghosh, Asia B, DO    ALPRAZolam (XANAX) tablet 0.25 mg, 0.25 mg, Oral, BID PRN, Zierle-Ghosh, Asia B, DO, 0.25 mg at 11/23/21 1728   aspirin tablet 325 mg, 325 mg, Oral, Daily, Zierle-Ghosh, Asia B, DO, 325 mg at 11/30/21 0905   Chlorhexidine Gluconate Cloth 2 % PADS 6 each, 6 each, Topical, Daily, Adhikari, Amrit, MD, 6 each at 11/30/21 0907   dexamethasone (DECADRON) tablet 4 mg, 4 mg, Oral, Daily, Adhikari, Amrit, MD, 4 mg at 11/30/21 0905   ferrous sulfate tablet 325 mg, 325 mg, Oral, Q breakfast, Adhikari, Amrit, MD, 325 mg at 11/30/21 0905   heparin injection 5,000 Units, 5,000 Units, Subcutaneous, Q8H, Zierle-Ghosh, Asia B, DO, 5,000 Units at 11/30/21 9480   hydrALAZINE (APRESOLINE) tablet 25 mg, 25 mg, Oral, Q8H, Adhikari, Amrit, MD, 25 mg at 11/30/21 1655   insulin aspart (novoLOG) injection 0-15 Units, 0-15 Units, Subcutaneous, TID WC, Adhikari, Amrit, MD, 2 Units at 11/29/21 1258   metoprolol tartrate (LOPRESSOR) tablet 25 mg, 25 mg, Oral, BID, Adhikari, Amrit, MD   ondansetron (ZOFRAN) tablet 4 mg, 4 mg, Oral, Q6H PRN **OR** ondansetron (ZOFRAN) injection 4 mg, 4 mg, Intravenous, Q6H PRN, Zierle-Ghosh, Asia B, DO   oxyCODONE (Oxy IR/ROXICODONE) immediate release tablet 5 mg, 5 mg, Oral, TID PRN, Zierle-Ghosh, Asia B, DO, 5 mg at 11/30/21 1049   pantoprazole (PROTONIX) EC tablet 40 mg, 40 mg, Oral, Daily, Adhikari, Amrit, MD, 40 mg at 11/30/21 0905   polyethylene glycol (MIRALAX / GLYCOLAX) packet 17 g, 17 g, Oral, Daily, Adhikari, Amrit, MD, 17 g at 11/30/21 3748   senna (SENOKOT) tablet 8.6 mg, 1 tablet, Oral, BID, Adhikari, Amrit, MD, 8.6 mg at 11/30/21 0905   simvastatin (ZOCOR) tablet 20 mg, 20 mg, Oral, Daily, Zierle-Ghosh, Asia B, DO, 20 mg at 11/30/21 0905   tamsulosin (FLOMAX) capsule 0.4 mg, 0.4 mg, Oral, Daily, Adhikari, Amrit, MD, 0.4 mg at 11/30/21 2707   traZODone (DESYREL) tablet 150 mg, 150 mg, Oral, QHS, Zierle-Ghosh, Asia B, DO, 150 mg at 11/29/21 2201  Patients Current Diet:  Diet Order              Diet Heart Room service appropriate? Yes; Fluid consistency: Thin  Diet effective now                   Precautions / Restrictions Precautions Precautions: Fall Precaution Comments: body habitus Restrictions Weight Bearing Restrictions: No   Has the patient had 2 or more falls or a fall with injury in the past year? No  Prior Activity Level Limited Community (1-2x/wk): Went out weekly to church on Sundays and to MD appointments.  Prior Functional Level Self Care: Did the patient need help bathing, dressing, using the toilet  or eating? Independent  Indoor Mobility: Did the patient need assistance with walking from room to room (with or without device)? Independent  Stairs: Did the patient need assistance with internal or external stairs (with or without device)? Independent  Functional Cognition: Did the patient need help planning regular tasks such as shopping or remembering to take medications? Needed some help  Patient Information Are you of Hispanic, Latino/a,or Spanish origin?: A. No, not of Hispanic, Latino/a, or Spanish origin What is your race?: B. Black or African American Do you need or want an interpreter to communicate with a doctor or health care staff?: 0. No  Patient's Response To:  Health Literacy and Transportation Is the patient able to respond to health literacy and transportation needs?: Yes Health Literacy - How often do you need to have someone help you when you read instructions, pamphlets, or other written material from your doctor or pharmacy?: Always In the past 12 months, has lack of transportation kept you from medical appointments or from getting medications?: No In the past 12 months, has lack of transportation kept you from meetings, work, or from getting things needed for daily living?: No  Home Assistive Devices / [place] Devices/Equipment: Blood pressure cuff, Cane (specify quad or straight) Home Equipment:  Cane - single point  Prior Device Use: Indicate devices/aids used by the patient prior to current illness, exacerbation or injury?  Cane  Current Functional Level Cognition  Overall Cognitive Status: Impaired/Different from baseline Current Attention Level: Sustained Orientation Level: Oriented X4 Following Commands: Follows one step commands with increased time, Follows one step commands inconsistently (pt with very hard time with R and L, processing cross body cues like reach across with your R hand to the L hand rain requiring max verbal and tactile cues) Safety/Judgement: Decreased awareness of deficits, Decreased awareness of safety General Comments: pt with noted processing difficulty requiring constant verbal cues and tactile cues to complete tasks. pt with poor STM ie. hand placement on slide board, seems to have motor planning issues, multimodal cues needed to complete all tasks. Eager to participate    Extremity Assessment (includes Sensation/Coordination)  Upper Extremity Assessment: Defer to OT evaluation RUE Deficits / Details: R weaker than L. residual from prior CVA. ROM and sensation is WFL RUE Sensation: WNL RUE Coordination: decreased fine motor LUE Deficits / Details: WNL  Lower Extremity Assessment: Defer to PT evaluation RLE Deficits / Details: strength grossly 3-/5, limited ankle PF/DF ROM actively, WFL passive RLE Sensation: decreased light touch LLE Deficits / Details: strength grossly 3-/5, limited ankle PF/DF ROM actively, WFL passive LLE Sensation: WNL    ADLs  Overall ADL's : Needs assistance/impaired Eating/Feeding: Independent, Sitting Grooming: Set up, Sitting Grooming Details (indicate cue type and reason): performed seated on EOB with min guard for balance due to reliant on at least one extremity for balance Upper Body Bathing: Minimal assistance, Sitting Lower Body Bathing: Maximal assistance, Bed level, +2 for physical assistance Upper Body Dressing  : Minimal assistance, Sitting Upper Body Dressing Details (indicate cue type and reason): changed gowns seated on EOB Lower Body Dressing: Maximal assistance, +2 for physical assistance, Bed level Toilet Transfer: Moderate assistance, +2 for physical assistance, +2 for safety/equipment, Requires wide/bariatric, Requires drop arm, Transfer board Toilet Transfer Details (indicate cue type and reason): simulated. bed>chair with slide board. requires simple one step directions & transfer toward R so she can push with L Toileting- Clothing Manipulation and Hygiene: Maximal assistance, Bed level Functional mobility during ADLs:  Moderate assistance, +2 for physical assistance, +2 for safety/equipment General ADL Comments: good sitting balance today with cues for body positioning. Initally with posteror lean, able to self correct. session focused on slide board transfer OOB - max cues but min-mod physical assist    Mobility  Overal bed mobility: Needs Assistance Bed Mobility: Rolling, Sidelying to Sit Rolling: Mod assist, +2 for physical assistance Sidelying to sit: +2 for physical assistance, Mod assist, HOB elevated Supine to sit: Mod assist, HOB elevated Sit to supine: Max assist Sit to sidelying: Max assist General bed mobility comments: pt with motor planning and processing difficulties requiring max verbal and tactile cues, especially with reaching across body, modA for trunk elevation and anterior scooting to foot flat    Transfers  Overall transfer level: Needs assistance Equipment used: Sliding board Transfers: Bed to chair/wheelchair/BSC Sit to Stand: Max assist, +2 physical assistance, From elevated surface Bed to/from chair/wheelchair/BSC transfer type:: Lateral/scoot transfer  Lateral/Scoot Transfers: With slide board, +2 physical assistance, Max assist General transfer comment: pt dependent for slide board placement, pt given step by step verbal cues to lean to the L to offweight R  hip as pt trying to push self across board to the chair to her R. constant verbal cues to lean forward and away from the chair. pt with solid effort but difficulty motor planning ultimately needing maxA +2 to reach chair.    Ambulation / Gait / Stairs / Wheelchair Mobility  Ambulation/Gait General Gait Details: unable at this time    Posture / Balance Dynamic Sitting Balance Sitting balance - Comments: requires close min guard, verbal cues for optimal placement of L hand on bed for support Balance Overall balance assessment: Needs assistance Sitting-balance support: Feet supported, Single extremity supported Sitting balance-Leahy Scale: Fair Sitting balance - Comments: requires close min guard, verbal cues for optimal placement of L hand on bed for support Postural control: Posterior lean Standing balance support: Bilateral upper extremity supported Standing balance-Leahy Scale: Zero Standing balance comment: maxA x 2    Special needs/care consideration Continuous Drip IV  KVO and Diabetic management Yes, h/o DM   Previous Home Environment (from acute therapy documentation) Living Arrangements: Children Available Help at Discharge: Family, Available 24 hours/day Type of Home: House Home Layout: One level Home Access: Stairs to enter Entrance Stairs-Rails: Right Entrance Stairs-Number of Steps: 10 Bathroom Shower/Tub: Chiropodist: Standard Home Care Services: No Additional Comments: son lives with pt  Discharge Living Setting Plans for Discharge Living Setting: House, Lives with (comment) (Lives with son.  Son does not work.) Type of Home at Discharge: House Discharge Home Layout: One level Discharge Home Access: Stairs to enter Entrance Stairs-Number of Steps: 10 steps at the front entry and 2 steps at the back entrance Discharge Bathroom Shower/Tub: Tub/shower unit, Curtain Discharge Bathroom Toilet: Standard Does the patient have any problems obtaining  your medications?: No  Social/Family/Support Systems Patient Roles: Parent (Has a son.) Contact Information: Jill Alexanders - son Anticipated Caregiver: Son - [name] Anticipated Caregiver's Contact Information: [name] - son - [phone] Ability/Limitations of Caregiver: Son can assist. Son does not work outside of the home. Caregiver Availability: 24/7 Discharge Plan Discussed with Primary Caregiver: Yes Is Caregiver In Agreement with Plan?: Yes Does Caregiver/Family have Issues with Lodging/Transportation while Pt is in Rehab?: No  Goals Patient/Family Goal for Rehab: PT/OT supervision goals Expected length of stay: 10-12 days Pt/Family Agrees to Admission and willing to participate: Yes Program Orientation Provided & Reviewed with Pt/Caregiver  Including Roles  & Responsibilities: Yes  Decrease burden of Care through IP rehab admission: N/A  Possible need for SNF placement upon discharge: Not planned  Patient Condition: I have reviewed medical [facility] from [facility], spoken with CM, and patient. I met with patient at the bedside for inpatient rehabilitation assessment.  Patient will benefit from ongoing PT and OT, can actively participate in 3 hours of therapy a day 5 days of the week, and can make measurable gains during the admission.  Patient will also benefit from the coordinated team approach during an Inpatient Acute Rehabilitation admission.  The patient will receive intensive therapy as well as Rehabilitation physician, nursing, social worker, and care management interventions.  Due to bladder management, bowel management, safety, skin/wound care, disease management, medication administration, pain management, and patient education the patient requires 24 hour a day rehabilitation nursing.  The patient is currently mod to max assist with mobility and basic ADLs.  Discharge setting and therapy post discharge at home with home health is anticipated.  Patient has agreed to  participate in the Acute Inpatient Rehabilitation Program and will admit today.  Preadmission Screen Completed By:  Retta Diones, 11/30/2021 12:22 PM ______________________________________________________________________   Discussed status with Dr. Naaman Plummer on 11/30/21 at 1000 and received approval for admission today.  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 Yes Co-Morbidities requiring supervision/potential complications: morbid oesity, DM, anemia, htn Due to bladder management, bowel management, safety, skin/wound care, disease management, medication administration, pain management, and patient education, does the patient require 24 hr/day rehab nursing? Yes Does the patient require coordinated care of a physician, rehab nurse, PT, OT  to address physical and functional deficits in the context of the above medical diagnosis(es)? Yes Addressing deficits in the following areas: balance, endurance, locomotion, strength, transferring, bowel/bladder control, bathing, dressing, feeding, grooming, toileting, and psychosocial support Can the patient actively participate in an intensive therapy program of at least 3 hrs of therapy 5 days a week? Yes The potential for patient to make measurable gains while on inpatient rehab is excellent Anticipated functional outcomes upon discharge from inpatient rehab: supervision PT, supervision OT, n/a SLP Estimated rehab length of stay to reach the above functional goals is: 10-12 days Anticipated discharge destination: Home 10. Overall Rehab/Functional Prognosis: excellent   MD Signature: Meredith Staggers, MD, [place] Director  Rehabilitation Services 11/30/2021

## 2021-11-25 NOTE — Progress Notes (Signed)
Repeat MRI reviewed.   She does have significant stenosis with some areas of contact of both the anterior and posterior cord.  Her exam I think is most consistent with a myelopathy, with an upper thoracic sensory level.  Given the presence of possibly compressive lesions at that level with I feel some cord signal change, I suspect that this is the etiology of her weakness.  Without significant enhancement, I think that transverse myelitis is relatively unlikely.  Spinal cord ischemia I think is likewise unlikely, and with spinal cord stroke it would not be expected to be improving this rapidly.  At this time, I would defer to neurosurgery if CT myelogram would add to surgical decision making.  Neurology to be available on an as-needed basis moving forward.  Roland Rack, MD [facility] [phone]  If 7pm- 7am, please page neurology on call as listed in [place].

## 2021-11-26 LAB — GLUCOSE, CAPILLARY
Glucose-Capillary: 131 mg/dL — ABNORMAL HIGH (ref 70–99)
Glucose-Capillary: 139 mg/dL — ABNORMAL HIGH (ref 70–99)
Glucose-Capillary: 163 mg/dL — ABNORMAL HIGH (ref 70–99)

## 2021-11-26 MED ORDER — DEXAMETHASONE 4 MG PO TABS
4.0000 mg | ORAL_TABLET | Freq: Two times a day (BID) | ORAL | Status: DC
  Administered 2021-11-26 – 2021-11-28 (×4): 4 mg via ORAL
  Filled 2021-11-26 (×4): qty 1

## 2021-11-26 NOTE — Progress Notes (Signed)
PROGRESS NOTE  KATALEAH [name]  OXB:[id number] DOB: [birth year] DOA: 11/20/2021 PCP: [name], MD   Brief Narrative: Brandi [name] is a 66 y.o. female with medical history significant of with history of diabetes mellitus type 2, hypertension, hyperlipidemia, stroke, presents the ED with a chief complaint of bilateral leg weakness.  Work-up MRI lumbar spine showed spondylosis and severe stenosis.  Neurology was consulted and was recommended to transfer to [facility] for further evaluation by neurosurgery service.  Started on Decadron.  Neurosurgery and neurology consulted.  PT/OT recommending CIR   Assessment & Plan:  * Leg weakness, bilateral- (present on admission) -CT head, MRI and MRA of brain -although limited due to motion showed no acute intracranial changes. 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  -Neurosurgery consulted and  following, no plan for surgical intervention for now. -Neurosurgery suspecting possible other neurological cause for paraplegia, so neurology consulted.  No further recommendation from neurology, low suspicion for transverse myelitis or any other neurological problems -PT/OT recommending CIR on  Discharge -Patient has mild improvement in the strength on bilateral lower extremities.  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 -Continue treatment with steroids,start tapering -Patient  transferred to [facility] for further evaluation by neurosurgery. -MRI of lumbar, thoracic and cervical spine showed widespread degenerative spondylosis, canal and foraminal stenosis. At T3-T4,T4-T5. there is a disc bulge with osteophytic ridging with moderate to severe canal  stenosis  Obesity- (present on admission) BMI of 39.5  Leucocytosis This is likely from steroids.  Continue to monitor  Acute urinary retention Foley placed here.  Started on tamsulosin  History of CVA (cerebrovascular accident) -Continue risk factor modifications -Continue the use of aspirin and statins.   -No new intracranial abnormalities appreciated on head images.   -Uses cane for ambulation  Mixed hyperlipidemia- (present on admission) -Continue statin -Heart healthy diet discussed with patient.  DM type 2 (diabetes mellitus, type 2) ([place]) -Patient currently not taking medications for diabetes -Hemoglobin A1c found to be 8.7. -Patient needs to be started on antidiabetic medications on discharge. -Continue current insulin regimen for now  Microcytic anemia- (present on admission) - Hemoglobin in the range of 8.  Iron studies showed severe iron deficiency. -Supplemented with  IV iron.  Patient also needs to be continued on oral iron therapy -No report of hematochezia or melena. 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  At that time colonoscopy and EGD were normal -She has chronic iron-deficiency anemia and her baseline hemoglobin is in the range of 8  HTN (hypertension)- (present on admission) -Continue hydralazine, lisinopril and metoprolol -Heart healthy diet discussed with patient. -Monitor blood pressure.  Currently normotensive  Hypokalemia-resolved as of 11/24/2021, (present on admission) - Supplemented and corrected              DVT prophylaxis:heparin injection 5,000 Units Start: 11/20/21 2200 SCDs Start: 11/20/21 2137     Code Status: Full Code  Family Communication: Son on phone on 2/11  Patient status:Inpatient  Anticipated discharge to:CIR   Consultants: Neurology, neurosurgery  Procedures: None  Antimicrobials:  Anti-infectives (From admission, onward)    None        Subjective:  Patient seen and examined the bedside this  morning.  Eating her breakfast, very comfortable.  No new complaints today.  Weakness on the bilateral lower extremities same as yesterday  Objective: Vitals:   11/25/21 1922 11/25/21 2353 11/26/21 0346 11/26/21 0754  BP: (!) 127/46 126/60 140/74 (!) 154/70  Pulse: (!) 52 (!) 50 (!) 50 (!) 50  Resp: 19 19 19 18   Temp: 99.4 F (37.4 C) 98.2 F (36.8 C) (!) 97.1 F (36.2 C) 98.4 F (36.9 C)  TempSrc: Oral Oral Oral Oral  SpO2: 99% 98% 98% 99%  Weight:      Height:        Intake/Output Summary (Last 24 hours) at 11/26/2021 1022 Last data filed at 11/26/2021 0630 Gross per 24 hour  Intake --  Output 1400 ml  Net -1400 ml   Filed Weights   11/20/21 1243  Weight: 111 kg    Examination:   General exam: Overall comfortable, not in distress, morbidly obese HEENT: PERRL Respiratory system:  no wheezes or crackles  Cardiovascular system: S1 & S2 heard, RRR.  Gastrointestinal system: Abdomen is nondistended, soft and nontender. Central nervous system: Alert and oriented, paraplegia, weakness on the right is more than left  extremities: No edema, no clubbing ,no cyanosis Skin: No rashes, no ulcers,no icterus    Data Reviewed: I have personally reviewed following labs and imaging studies  CBC: Recent Labs  Lab 11/21/21 0348 11/22/21 0803 11/23/21 0131 11/24/21 0109 11/25/21 0130  WBC 5.7 10.3 11.1* 11.2* 13.2*  NEUTROABS 5.0 8.7* 9.2* 8.1* 9.6*  HGB 8.7* 8.8* 9.0* 8.9* 9.8*  HCT 31.7* 30.3* 31.3* 31.2* 33.6*  MCV 67.6* 65.4* 66.2* 66.1* 66.7*  PLT 475* 463* 493* 494* 098*   Basic Metabolic Panel: Recent Labs  Lab 11/20/21 1345 11/21/21 0348 11/25/21 0130  NA 139 140 137  K 3.2* 3.8 4.3  CL 103 108 104  CO2 26 24 23   GLUCOSE 115* 199* 136*  BUN 14 13 25*  CREATININE 1.02* 0.85 1.06*  CALCIUM 8.9 8.8* 8.7*  MG  --  2.0  --      Recent Results (from the past 240 hour(s))  Resp Panel by RT-PCR  (Flu A&B, Covid) Nasopharyngeal Swab     Status: None   Collection Time: 11/20/21  2:13 PM   Specimen: Nasopharyngeal Swab; Nasopharyngeal(NP) swabs in vial transport medium  Result Value Ref Range Status   SARS Coronavirus 2 by RT PCR NEGATIVE NEGATIVE Final    Comment: (NOTE) SARS-CoV-2 target nucleic acids are NOT DETECTED.  The SARS-CoV-2 RNA is generally detectable in upper respiratory specimens during the acute phase of infection. The lowest concentration of SARS-CoV-2 viral copies this assay can detect is 138 copies/mL. A negative result does not preclude SARS-Cov-2 infection and should not be used as the sole basis for treatment or other patient management decisions. A negative result may occur with  improper specimen collection/handling, submission of specimen other than nasopharyngeal swab, presence of viral mutation(s) within the areas targeted by this assay, and inadequate number of viral copies(<138 copies/mL). A negative result must be combined with clinical observations, patient history, and epidemiological information. The expected result is Negative.  Fact Sheet for Patients:  EntrepreneurPulse.com.au  Fact Sheet for Healthcare Providers:  IncredibleEmployment.be  This test is no t yet approved or cleared by the [place] FDA and  has been authorized for detection and/or diagnosis of SARS-CoV-2 by FDA under an Emergency Use Authorization (EUA). This EUA will remain  in effect (meaning this test can be used) for  the duration of the COVID-19 declaration under Section 564(b)(1) of the Act, 21 U.S.C.section 360bbb-3(b)(1), unless the authorization is terminated  or revoked sooner.       Influenza A by PCR NEGATIVE NEGATIVE Final   Influenza B by PCR NEGATIVE NEGATIVE Final    Comment: (NOTE) The Xpert Xpress SARS-CoV-2/FLU/RSV plus assay is intended as an aid in the diagnosis of influenza from Nasopharyngeal swab specimens  and should not be used as a sole basis for treatment. Nasal washings and aspirates are unacceptable for Xpert Xpress SARS-CoV-2/FLU/RSV testing.  Fact Sheet for Patients: EntrepreneurPulse.com.au  Fact Sheet for Healthcare Providers: IncredibleEmployment.be  This test is not yet approved or cleared by the [place] FDA and has been authorized for detection and/or diagnosis of SARS-CoV-2 by FDA under an Emergency Use Authorization (EUA). This EUA will remain in effect (meaning this test can be used) for the duration of the COVID-19 declaration under Section 564(b)(1) of the Act, 21 U.S.C. section 360bbb-3(b)(1), unless the authorization is terminated or revoked.  Performed at [facility], [address] [place], [place] [address]      Radiology Studies: MR THORACIC SPINE WO CONTRAST  Result Date: 11/24/2021 CLINICAL DATA:  Provided history: Myelopathy, acute, thoracic spine. EXAM: MRI THORACIC SPINE WITHOUT CONTRAST TECHNIQUE: Multiplanar, multisequence MR imaging of the thoracic spine was performed. No intravenous contrast was administered. COMPARISON:  Prior thoracic spine MRI examinations 11/23/2021 and 11/21/2021. FINDINGS: A problem-oriented repeat thoracic spine MRI was performed to better assess sites of stenosis within the upper thoracic spinal canal. The current examination spans the C6-T10 levels on sagittal imaging. The T2-T3, T3-T4, T4-T5 and T5-T6 levels are included on axial imaging. In addition to routine sequences, multiple high-resolution 3D T2-weighted sequences were also performed. Alignment:  No significant spondylolisthesis. Vertebrae: No thoracic vertebral compression fracture. Multilevel degenerative endplate irregularity, greatest at T3-T4 and T4-T5. Apparent solid bridging anterior osteophytes at the T5-T6, T6-T7, T7-T8 and T9-T10 levels. 2.2 cm hemangioma within the right aspect of the T3 vertebral body. Cord: No definite  spinal cord signal abnormality is appreciated. Spinal cord flattening at T3-T4 and T4-T5, as described below. Paraspinal and other soft tissues: No abnormality identified within included portions of the thorax. Paraspinal soft tissues unremarkable. Disc levels: Multilevel disc space narrowing. Most notably, disc space narrowing is advanced at T2-T3, T3-T4, T5-T6, T6-T7, T7-T8, T8-T9 and T9-T10. Also of note, disc degeneration is moderate/advanced at T4-T5. T1-T2: Imaged sagittally. Not included on axial imaging. Disc bulge with endplate spurring. Superimposed small left center disc protrusion. No more than mild spinal canal stenosis is suspected. No compressive foraminal narrowing. T2-T3: Disc bulge. Facet arthrosis. The disc bulge mildly effaces the ventral thecal sac and may contact the ventral spinal cord. No compressive foraminal stenosis. T3-T4: Disc bulge with endplate spurring/osteophytic ridging. Facet arthrosis. Moderate to moderately severe spinal canal stenosis with contact upon the dorsal and ventral spinal cord and mild spinal cord flattening. Bilateral neural foraminal narrowing (moderate right, mild left). T4-T5: Disc bulge with endplate spurring/osteophytic ridging. Facet arthrosis. Moderate to moderately severe spinal canal stenosis with contact upon the dorsal and ventral spinal cord and mild spinal cord flattening. Mild/moderate bilateral neural foraminal narrowing. T5-T6: Anterior bridging osteophyte. Facet arthrosis. No significant spinal canal or foraminal stenosis. The T6-T7 and more caudal thoracic levels are not included on axial imaging. Please refer to the recent prior thoracic spine MRI of 11/23/2021 for a description of thoracic spondylosis at these more caudal levels. IMPRESSION: Problem-oriented MRI to better characterize sites of  spinal canal stenosis within the upper thoracic spine, as described. Upper thoracic spondylosis, as outlined and with findings most notably as follows. At  T3-T4, there is a disc bulge with osteophytic ridging. Facet arthrosis. Resultant multifactorial moderate to moderately severe spinal canal stenosis, with contact upon the dorsal and ventral spinal cord, and mild spinal cord flattening. Bilateral neural foraminal narrowing (moderate right, mild left). At T4-T5, there is a disc bulge with osteophytic ridging. Facet arthrosis. Resultant multifactorial moderate to moderately severe spinal canal stenosis, with contact upon the dorsal and ventral spinal cord, and mild spinal cord flattening. Mild/moderate bilateral neural foraminal narrowing. No more than mild spinal canal stenosis, and no compressive foraminal stenosis, at the remaining upper thoracic levels. Apparent solid bridging anterior osteophytes at the T5-T6 through T9-T10 levels. Additional bridging anterior osteophytes were identified more inferiorly within the thoracic spine on the recent prior thoracic spine MRI of 11/23/2021. Electronically Signed   By: Kellie Simmering D.O.   On: 11/24/2021 17:22    Scheduled Meds:  aspirin  325 mg Oral Daily   Chlorhexidine Gluconate Cloth  6 each Topical Daily   dexamethasone  4 mg Oral Q12H   ferrous sulfate  325 mg Oral Q breakfast   heparin  5,000 Units Subcutaneous Q8H   hydrALAZINE  25 mg Oral Q8H   lisinopril  20 mg Oral Daily   metoprolol tartrate  50 mg Oral BID   pantoprazole  40 mg Oral Daily   polyethylene glycol  17 g Oral Daily   senna  1 tablet Oral BID   simvastatin  20 mg Oral Daily   tamsulosin  0.4 mg Oral Daily   traZODone  150 mg Oral QHS   Continuous Infusions:  sodium chloride 10 mL/hr at 11/22/21 1151     LOS: 6 days   Shelly Coss, MD [facility] P2/09/2022, 10:22 AM

## 2021-11-26 NOTE — Progress Notes (Signed)
Subjective: The patient is alert and pleasant.  She has no complaints.  Objective: Vital signs in last 24 hours: Temp:  [97.1 F (36.2 C)-99.4 F (37.4 C)] 98.4 F (36.9 C) (02/12 0754) Pulse Rate:  [50-52] 50 (02/12 0754) Resp:  [18-20] 18 (02/12 0754) BP: (126-154)/(46-74) 154/70 (02/12 0754) SpO2:  [98 %-100 %] 99 % (02/12 0754) Estimated body mass index is 39.5 kg/m as calculated from the following:   Height as of this encounter: 5\' 6"  (1.676 m).   Weight as of this encounter: 111 kg.   Intake/Output from previous day: 02/11 0701 - 02/12 0700 In: 120 [P.O.:120] Out: 1400 [Urine:1400] Intake/Output this shift: No intake/output data recorded.  Physical exam the patient is alert and pleasant.  Her strength is 4+/5 in her bilateral quadricep, gastrocnemius and dorsiflexors.  She does [employer].  Lab Results: Recent Labs    11/24/21 0109 11/25/21 0130  WBC 11.2* 13.2*  HGB 8.9* 9.8*  HCT 31.2* 33.6*  PLT 494* 505*   BMET Recent Labs    11/25/21 0130  NA 137  K 4.3  CL 104  CO2 23  GLUCOSE 136*  BUN 25*  CREATININE 1.06*  CALCIUM 8.7*    Studies/Results: MR THORACIC SPINE WO CONTRAST  Result Date: 11/24/2021 CLINICAL DATA:  Provided history: Myelopathy, acute, thoracic spine. EXAM: MRI THORACIC SPINE WITHOUT CONTRAST TECHNIQUE: Multiplanar, multisequence MR imaging of the thoracic spine was performed. No intravenous contrast was administered. COMPARISON:  Prior thoracic spine MRI examinations 11/23/2021 and 11/21/2021. FINDINGS: A problem-oriented repeat thoracic spine MRI was performed to better assess sites of stenosis within the upper thoracic spinal canal. The current examination spans the C6-T10 levels on sagittal imaging. The T2-T3, T3-T4, T4-T5 and T5-T6 levels are included on axial imaging. In addition to routine sequences, multiple high-resolution 3D T2-weighted sequences were also performed. Alignment:  No significant spondylolisthesis. Vertebrae: No  thoracic vertebral compression fracture. Multilevel degenerative endplate irregularity, greatest at T3-T4 and T4-T5. Apparent solid bridging anterior osteophytes at the T5-T6, T6-T7, T7-T8 and T9-T10 levels. 2.2 cm hemangioma within the right aspect of the T3 vertebral body. Cord: No definite spinal cord signal abnormality is appreciated. Spinal cord flattening at T3-T4 and T4-T5, as described below. Paraspinal and other soft tissues: No abnormality identified within included portions of the thorax. Paraspinal soft tissues unremarkable. Disc levels: Multilevel disc space narrowing. Most notably, disc space narrowing is advanced at T2-T3, T3-T4, T5-T6, T6-T7, T7-T8, T8-T9 and T9-T10. Also of note, disc degeneration is moderate/advanced at T4-T5. T1-T2: Imaged sagittally. Not included on axial imaging. Disc bulge with endplate spurring. Superimposed small left center disc protrusion. No more than mild spinal canal stenosis is suspected. No compressive foraminal narrowing. T2-T3: Disc bulge. Facet arthrosis. The disc bulge mildly effaces the ventral thecal sac and may contact the ventral spinal cord. No compressive foraminal stenosis. T3-T4: Disc bulge with endplate spurring/osteophytic ridging. Facet arthrosis. Moderate to moderately severe spinal canal stenosis with contact upon the dorsal and ventral spinal cord and mild spinal cord flattening. Bilateral neural foraminal narrowing (moderate right, mild left). T4-T5: Disc bulge with endplate spurring/osteophytic ridging. Facet arthrosis. Moderate to moderately severe spinal canal stenosis with contact upon the dorsal and ventral spinal cord and mild spinal cord flattening. Mild/moderate bilateral neural foraminal narrowing. T5-T6: Anterior bridging osteophyte. Facet arthrosis. No significant spinal canal or foraminal stenosis. The T6-T7 and more caudal thoracic levels are not included on axial imaging. Please refer to the recent prior thoracic spine MRI of 11/23/2021  for a description  of thoracic spondylosis at these more caudal levels. IMPRESSION: Problem-oriented MRI to better characterize sites of spinal canal stenosis within the upper thoracic spine, as described. Upper thoracic spondylosis, as outlined and with findings most notably as follows. At T3-T4, there is a disc bulge with osteophytic ridging. Facet arthrosis. Resultant multifactorial moderate to moderately severe spinal canal stenosis, with contact upon the dorsal and ventral spinal cord, and mild spinal cord flattening. Bilateral neural foraminal narrowing (moderate right, mild left). At T4-T5, there is a disc bulge with osteophytic ridging. Facet arthrosis. Resultant multifactorial moderate to moderately severe spinal canal stenosis, with contact upon the dorsal and ventral spinal cord, and mild spinal cord flattening. Mild/moderate bilateral neural foraminal narrowing. No more than mild spinal canal stenosis, and no compressive foraminal stenosis, at the remaining upper thoracic levels. Apparent solid bridging anterior osteophytes at the T5-T6 through T9-T10 levels. Additional bridging anterior osteophytes were identified more inferiorly within the thoracic spine on the recent prior thoracic spine MRI of 11/23/2021. Electronically Signed   By: Kellie Simmering D.O.   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  I would suggest a rehab consult.  LOS: 6 days     [name] 11/26/2021, 9:58 AM     Patient ID: Brandi [name], female   DOB: 10-11-1956, 66 y.o.   MRN: [id number]

## 2021-11-27 LAB — GLUCOSE, CAPILLARY
Glucose-Capillary: 115 mg/dL — ABNORMAL HIGH (ref 70–99)
Glucose-Capillary: 130 mg/dL — ABNORMAL HIGH (ref 70–99)
Glucose-Capillary: 132 mg/dL — ABNORMAL HIGH (ref 70–99)

## 2021-11-27 MED ORDER — OXYCODONE HCL 5 MG PO TABS
5.0000 mg | ORAL_TABLET | Freq: Once | ORAL | Status: AC
Start: 2021-11-27 — End: 2021-11-27
  Administered 2021-11-27: 5 mg via ORAL

## 2021-11-27 NOTE — Progress Notes (Signed)
PROGRESS NOTE  [name]  NWG:[id number] DOB: 05/21/1956 DOA: 11/20/2021 PCP: [name], MD   Brief Narrative: Brandi [name] is a 66 y.o. female with medical history significant of with history of diabetes mellitus type 2, hypertension, hyperlipidemia, stroke, presents the ED with a chief complaint of bilateral leg weakness.  Work-up MRI lumbar spine showed spondylosis and severe stenosis.  Neurology was consulted and was recommended to transfer to [facility] for further evaluation by neurosurgery service.  Started on Decadron.  Neurosurgery and neurology consulted.  PT/OT recommending CIR   Assessment & Plan:  * Leg weakness, bilateral- (present on admission) -CT head, MRI and MRA of brain -although limited due to motion showed no acute intracranial changes. 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  -Neurosurgery consulted and  following, no plan for surgical intervention for now. -Neurosurgery suspecting possible other neurological cause for paraplegia, so neurology consulted.  No further recommendation from neurology, low suspicion for transverse myelitis or any other neurological problems -PT/OT recommending CIR on  Discharge -Patient has mild improvement in the strength on bilateral lower extremities.  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 -Continue treatment with steroids,start tapering -Patient  transferred to [facility] for further evaluation by neurosurgery. -MRI of lumbar, thoracic and cervical spine showed widespread degenerative spondylosis, canal and foraminal stenosis. At T3-T4,T4-T5. there is a disc bulge with osteophytic ridging with moderate to severe canal  stenosis  Obesity- (present on admission) BMI of 39.5  Leucocytosis This is likely from steroids.  Continue to monitor  Acute urinary retention Foley placed here.  Started on tamsulosin  History of CVA (cerebrovascular accident) -Continue risk factor modifications -Continue the use of aspirin and statins.   -No new intracranial abnormalities appreciated on head images.   -Uses cane for ambulation  Mixed hyperlipidemia- (present on admission) -Continue statin -Heart healthy diet discussed with patient.  DM type 2 (diabetes mellitus, type 2) ([place]) -Patient currently not taking medications for diabetes -Hemoglobin A1c found to be 8.7. -Patient needs to be started on antidiabetic medications on discharge. -Continue current insulin regimen for now  Microcytic anemia- (present on admission) - Hemoglobin in the range of 8.  Iron studies showed severe iron deficiency. -Supplemented with  IV iron.  Patient also needs to be continued on oral iron therapy -No report of hematochezia or melena. 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  At that time colonoscopy and EGD were normal -She has chronic iron-deficiency anemia and her baseline hemoglobin is in the range of 8  HTN (hypertension)- (present on admission) -Continue hydralazine, lisinopril and metoprolol -Heart healthy diet discussed with patient. -Monitor blood pressure.  Currently normotensive  Hypokalemia-resolved as of 11/24/2021, (present on admission) - Supplemented and corrected              DVT prophylaxis:heparin injection 5,000 Units Start: 11/20/21 2200 SCDs Start: 11/20/21 2137     Code Status: Full Code  Family Communication: Son at bedside on 2/13  Patient status:Inpatient  Anticipated discharge to:CIR   Consultants: Neurology, neurosurgery  Procedures: None  Antimicrobials:  Anti-infectives (From admission, onward)    None        Subjective:  Patient seen and examined at the bedside  this morning.  Hemodynamically stable.  She was working with PT/OT, sitting in the chair.  Denies any new complaints.  Improvement in the weakness of bilateral lower extremities  Objective: Vitals:   11/26/21 2007 11/27/21 0410 11/27/21 0745 11/27/21 1121  BP: 126/65 113/67 133/64 (!) 145/75  Pulse: (!) 47 (!) 48 (!) 50 (!) 51  Resp:  18 16 16   Temp: 98 F (36.7 C) 97.7 F (36.5 C) (!) 97.5 F (36.4 C) 98.4 F (36.9 C)  TempSrc: Oral Oral Oral Oral  SpO2: 97% 98% 97% 98%  Weight:      Height:        Intake/Output Summary (Last 24 hours) at 11/27/2021 1127 Last data filed at 11/27/2021 0011 Gross per 24 hour  Intake --  Output 1050 ml  Net -1050 ml   Filed Weights   11/20/21 1243  Weight: 111 kg    Examination:   General exam: Overall comfortable, not in distress, morbidly obese HEENT: PERRL Respiratory system:  no wheezes or crackles  Cardiovascular system: S1 & S2 heard, RRR.  Gastrointestinal system: Abdomen is nondistended, soft and nontender. Central nervous system: Alert and oriented, paraplegia, residual right-sided hemiparesis Extremities: No edema, no clubbing ,no cyanosis Skin: No rashes, no ulcers,no icterus    Data Reviewed: I have personally reviewed following labs and imaging studies  CBC: Recent Labs  Lab 11/21/21 0348 11/22/21 0803 11/23/21 0131 11/24/21 0109 11/25/21 0130  WBC 5.7 10.3 11.1* 11.2* 13.2*  NEUTROABS 5.0 8.7* 9.2* 8.1* 9.6*  HGB 8.7* 8.8* 9.0* 8.9* 9.8*  HCT 31.7* 30.3* 31.3* 31.2* 33.6*  MCV 67.6* 65.4* 66.2* 66.1* 66.7*  PLT 475* 463* 493* 494* 540*   Basic Metabolic Panel: Recent Labs  Lab 11/20/21 1345 11/21/21 0348 11/25/21 0130  NA 139 140 137  K 3.2* 3.8 4.3  CL 103 108 104  CO2 26 24 23   GLUCOSE 115* 199* 136*  BUN 14 13 25*  CREATININE 1.02* 0.85 1.06*  CALCIUM 8.9 8.8* 8.7*  MG  --  2.0  --      Recent Results (from the past 240 hour(s))   Resp Panel by RT-PCR (Flu A&B, Covid) Nasopharyngeal Swab     Status: None   Collection Time: 11/20/21  2:13 PM   Specimen: Nasopharyngeal Swab; Nasopharyngeal(NP) swabs in vial transport medium  Result Value Ref Range Status   SARS Coronavirus 2 by RT PCR NEGATIVE NEGATIVE Final    Comment: (NOTE) SARS-CoV-2 target nucleic acids are NOT DETECTED.  The SARS-CoV-2 RNA is generally detectable in upper respiratory specimens during the acute phase of infection. The lowest concentration of SARS-CoV-2 viral copies this assay can detect is 138 copies/mL. A negative result does not preclude SARS-Cov-2 infection and should not be used as the sole basis for treatment or other patient management decisions. A negative result may occur with  improper specimen collection/handling, submission of specimen other than nasopharyngeal swab, presence of viral mutation(s) within the areas targeted by this assay, and inadequate number of viral copies(<138 copies/mL). A negative result must be combined with clinical observations, patient history, and epidemiological information. The expected result is Negative.  Fact Sheet for Patients:  EntrepreneurPulse.com.au  Fact Sheet for Healthcare Providers:  IncredibleEmployment.be  This test is no t yet approved or cleared by the [place] FDA and  has been authorized for detection and/or diagnosis of SARS-CoV-2 by FDA under an Emergency Use Authorization (EUA). This EUA will remain  in effect (meaning this test can be used) for  the duration of the COVID-19 declaration under Section 564(b)(1) of the Act, 21 U.S.C.section 360bbb-3(b)(1), unless the authorization is terminated  or revoked sooner.       Influenza A by PCR NEGATIVE NEGATIVE Final   Influenza B by PCR NEGATIVE NEGATIVE Final    Comment: (NOTE) The Xpert Xpress SARS-CoV-2/FLU/RSV plus assay is intended as an aid in the diagnosis of influenza from  Nasopharyngeal swab specimens and should not be used as a sole basis for treatment. Nasal washings and aspirates are unacceptable for Xpert Xpress SARS-CoV-2/FLU/RSV testing.  Fact Sheet for Patients: EntrepreneurPulse.com.au  Fact Sheet for Healthcare Providers: IncredibleEmployment.be  This test is not yet approved or cleared by the [place] FDA and has been authorized for detection and/or diagnosis of SARS-CoV-2 by FDA under an Emergency Use Authorization (EUA). This EUA will remain in effect (meaning this test can be used) for the duration of the COVID-19 declaration under Section 564(b)(1) of the Act, 21 U.S.C. section 360bbb-3(b)(1), unless the authorization is terminated or revoked.  Performed at [facility], [address], [place], [place] [address]      Radiology Studies: No results found.  Scheduled Meds:  aspirin  325 mg Oral Daily   Chlorhexidine Gluconate Cloth  6 each Topical Daily   dexamethasone  4 mg Oral Q12H   ferrous sulfate  325 mg Oral Q breakfast   heparin  5,000 Units Subcutaneous Q8H   hydrALAZINE  25 mg Oral Q8H   lisinopril  20 mg Oral Daily   metoprolol tartrate  50 mg Oral BID   pantoprazole  40 mg Oral Daily   polyethylene glycol  17 g Oral Daily   senna  1 tablet Oral BID   simvastatin  20 mg Oral Daily   tamsulosin  0.4 mg Oral Daily   traZODone  150 mg Oral QHS   Continuous Infusions:  sodium chloride 10 mL/hr at 11/22/21 1151     LOS: 7 days   Shelly Coss, MD [facility] P2/13/2023, 11:27 AM

## 2021-11-27 NOTE — Progress Notes (Signed)
I have gone over her imaging once again.  I went over her imaging with Dr. Arnoldo Morale over the weekend.  I discussed this case with several of my partners.  I think this is a complex case given her presentation and her spinal stenosis in the cervical thoracic and lumbar regions.  Her presentation is not typical of any of these findings on the imaging as there was no injury that would give her an acute spinal cord injury in the thoracic region to make her paraparetic.  Lumbar spinal stenosis that is chronic typically does not cause acute paraparesis.  I do not find her to have a sensory level and I cannot get her to tell me she has a sensory level on history.  She only complains of loss of sensation in the left leg from the knee down.  There is no thoracic sensory level.  On exam today she is much stronger.  She is 4 - to 4 out of 5 in the proximal lower extremities and distal lower extremities on both sides.  The right side is almost equal to the left side now.  I think she is hyporeflexic.  She has no clonus.  Her toes are downgoing.  This would argue against a myelopathy.  It could argue for acute spinal cord injury but there is no description of an injury in her history.  There was no fall or trauma or other incident that would cause an acute spinal cord injury in the thoracic region.  I have explained to her that she does have disease in all 3 levels of her spine.  We could consider thoracic laminectomy at T3-4 and T4-5 but with her improvement I feel like the risks outweigh the benefits at this time.  When I discussed this case with my partners they feel the same way.  We have recommended continued therapy, and a rehab consult.  I do think there may come a time where she will likely need decompressive surgery given her spinal stenosis.

## 2021-11-27 NOTE — Progress Notes (Signed)
Physical Therapy Treatment Patient Details Name: Brandi [name] MRN: [id number] DOB: 1956-05-04 Today's Date: 11/27/2021   History of Present Illness 66 y/o female presented to ED on 11/20/21 of bilateral LE weakness x 1 week and fall out of bed. CT head and MRI negative. 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 PMH: CVA, HTN, Diabetes    PT Comments    Pt continues with bilat LE weakness but appears improved from previous sessions. R LE weaker than L LE. Pt with strong desire to get to the chair today. Worked with OT to transfer pt to chair utilizing slide board transfer to the R. Pt gave excellent effort and very appreciative. Worked with pt and son on LE HEP. Pt continues to remain appropriate for AIR upon d/c to address mobility either at w/c level or ambulation pending the progression of LE strength. Acute PT to cont to follow.    Recommendations for follow up therapy are one component of a multi-disciplinary discharge planning process, led by the attending physician.  Recommendations may be updated based on patient status, additional functional criteria and insurance authorization.  Follow Up Recommendations  Acute inpatient rehab (3hours/day)     Assistance Recommended at Discharge Frequent or constant Supervision/Assistance  Patient can return home with the following Two people to help with walking and/or transfers;Two people to help with bathing/dressing/bathroom;Assistance with cooking/housework;Direct supervision/assist for medications management;Direct supervision/assist for financial management;Assist for transportation;Help with stairs or ramp for entrance   Equipment Recommendations  Hospital bed;Wheelchair (measurements PT);Wheelchair cushion (measurements PT);Other (comment)    Recommendations for Other Services Rehab consult     Precautions / Restrictions Precautions Precautions: Fall Precaution Comments: body  habitus Restrictions Weight Bearing Restrictions: No     Mobility  Bed Mobility Overal bed mobility: Needs Assistance Bed Mobility: Supine to Sit Rolling: Mod assist   Supine to sit: Mod assist, HOB elevated     General bed mobility comments: max directional verbal cues to bring LEs off EOB, modA for R LE management, tactile cues to reach across with R UE to pull up on hand rail, modA for trunk elevation and to square hips up at EOB    Transfers Overall transfer level: Needs assistance Equipment used: Sliding board Transfers: Bed to chair/wheelchair/BSC            Lateral/Scoot Transfers: Mod assist, With slide board, +2 physical assistance General transfer comment: pt dependent for slide board placement, pt given step by step verbal cues to lean to the L to offweight R hip as pt trying to push self across board to the chair to her R. constant verbal cues to lean forward and away from the chair. pt with solid effort but did require modAx2 for board management and sliding across    Ambulation/Gait               General Gait Details: unable at this time   Stairs             Wheelchair Mobility    Modified Rankin (Stroke Patients Only)       Balance Overall balance assessment: Needs assistance Sitting-balance support: Feet supported, Single extremity supported Sitting balance-Leahy Scale: Fair Sitting balance - Comments: requires close min guard, verbal cues for optimal placement of L hand on bed for support Postural control: Posterior lean  Cognition Arousal/Alertness: Awake/alert Behavior During Therapy: WFL for tasks assessed/performed Overall Cognitive Status: Impaired/Different from baseline Area of Impairment: Attention, Following commands, Safety/judgement, Awareness, Problem solving                   Current Attention Level: Sustained (pt easily distracted, hard for her to focus with  multiple people talking)   Following Commands: Follows one step commands with increased time   Awareness: Emergent Problem Solving: Slow processing, Requires verbal cues, Difficulty sequencing, Requires tactile cues General Comments: pt requiring 1 person as lead instructor as pt has to really focus on instruction.        Exercises General Exercises - Lower Extremity Quad Sets: AROM, Both, 10 reps, Supine (given as part of HEP) Gluteal Sets: AROM, Both, 10 reps, Seated (given as part of HEP) Long Arc Quad: AAROM, Both, 10 reps, Seated Heel Slides: AAROM, Both, 10 reps, Seated Heel Raises: AROM, Both, 10 reps, Seated (AA on the R LE)    General Comments General comments (skin integrity, edema, etc.): VSS on RA, son present and supportive      Pertinent Vitals/Pain Pain Assessment Pain Assessment: No/denies pain    Home Living                          Prior Function            PT Goals (current goals can now be found in the care plan section) Acute Rehab PT Goals Patient Stated Goal: to walk PT Goal Formulation: With patient/family Time For Goal Achievement: 12/06/21 Potential to Achieve Goals: Fair Progress towards PT goals: Progressing toward goals    Frequency    Min 4X/week      PT Plan Frequency needs to be updated    Co-evaluation PT/OT/SLP Co-Evaluation/Treatment: Yes Reason for Co-Treatment: For patient/therapist safety;To address functional/ADL transfers PT goals addressed during session: Mobility/safety with mobility        AM-PAC PT "6 Clicks" Mobility   Outcome Measure  Help needed turning from your back to your side while in a flat bed without using bedrails?: A Lot Help needed moving from lying on your back to sitting on the side of a flat bed without using bedrails?: A Lot Help needed moving to and from a bed to a chair (including a wheelchair)?: A Lot Help needed standing up from a chair using your arms (e.g., wheelchair or  bedside chair)?: Total Help needed to walk in hospital room?: Total Help needed climbing 3-5 steps with a railing? : Total 6 Click Score: 9    End of Session Equipment Utilized During Treatment: Gait belt Activity Tolerance: Patient tolerated treatment well Patient left: in chair;with call bell/phone within reach;with chair alarm set;with family/visitor present Nurse Communication: Mobility status;Need for lift equipment PT Visit Diagnosis: Other abnormalities of gait and mobility (R26.89);Muscle weakness (generalized) (M62.81);Other symptoms and signs involving the nervous system (R29.898)     Time: 6226-3335 PT Time Calculation (min) (ACUTE ONLY): 33 min  Charges:  $Therapeutic Activity: 8-22 mins                     Kittie Plater, PT, DPT Acute Rehabilitation Services Pager #: [phone] Office #: [phone]    Berline Lopes 11/27/2021, 10:02 AM

## 2021-11-27 NOTE — Progress Notes (Signed)
Occupational Therapy Treatment Patient Details Name: Brandi [name] MRN: [id number] DOB: 1956-06-20 Today's Date: 11/27/2021   History of present illness 66 y/o female presented to ED on 11/20/21 of bilateral LE weakness x 1 week and fall out of bed. CT head and MRI negative. 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 PMH: CVA, HTN, Diabetes   OT comments  Brandi [name] is progressing well with some improvements noted in BLE movement and sitting balance. She demonstrated mild posterior lean in sitting initially however with cues and positioning she was able to self correct and sit with min guard. Session ultimately focused on bed>chair transfer. Pt benefited from simple one step cues for slide board transfer and required mod A +2 to complete. Pt remains extremely motivated. D/c plan remains appropriate, OT to contiue to follow acutely.    Recommendations for follow up therapy are one component of a multi-disciplinary discharge planning process, led by the attending physician.  Recommendations may be updated based on patient status, additional functional criteria and insurance authorization.    Follow Up Recommendations  Acute inpatient rehab (3hours/day)    Assistance Recommended at Discharge Frequent or constant Supervision/Assistance  Patient can return home with the following  A lot of help with walking and/or transfers;A lot of help with bathing/dressing/bathroom;Assistance with cooking/housework;Assist for transportation;Help with stairs or ramp for entrance   Equipment Recommendations  Wheelchair (measurements OT);Wheelchair cushion (measurements OT)    Recommendations for Other Services Rehab consult    Precautions / Restrictions Precautions Precautions: Fall Precaution Comments: body habitus Restrictions Weight Bearing Restrictions: No       Mobility Bed Mobility Overal bed mobility: Needs Assistance Bed Mobility: Supine to  Sit Rolling: Mod assist   Supine to sit: Mod assist, HOB elevated     General bed mobility comments: max directional verbal cues to bring LEs off EOB, modA for R LE management, tactile cues to reach across with R UE to pull up on hand rail, modA for trunk elevation and to square hips up at EOB    Transfers Overall transfer level: Needs assistance Equipment used: Sliding board Transfers: Bed to chair/wheelchair/BSC            Lateral/Scoot Transfers: Mod assist, With slide board, +2 physical assistance General transfer comment: pt dependent for slide board placement, pt given step by step verbal cues to lean to the L to offweight R hip as pt trying to push self across board to the chair to her R. constant verbal cues to lean forward and away from the chair. pt with solid effort but did require modAx2 for board management and sliding across     Balance Overall balance assessment: Needs assistance Sitting-balance support: Feet supported, Single extremity supported Sitting balance-Leahy Scale: Fair Sitting balance - Comments: requires close min guard, verbal cues for optimal placement of L hand on bed for support Postural control: Posterior lean                                 ADL either performed or assessed with clinical judgement   ADL Overall ADL's : Needs assistance/impaired Eating/Feeding: Independent;Sitting   Grooming: Set up;Sitting                   Toilet Transfer: Moderate assistance;+2 for physical assistance;+2 for safety/equipment;Requires wide/bariatric;Requires drop arm;Transfer board Toilet Transfer Details (indicate cue type and reason): simulated. bed>chair with slide board.  requires simple one step directions & transfer toward R so she can push with L         Functional mobility during ADLs: Moderate assistance;+2 for physical assistance;+2 for safety/equipment General ADL Comments: good sitting balance today with cues for body  positioning. Initally with posteror lean, able to self correct. session focused on slide board transfer OOB - max cues but min-mod physical assist    Extremity/Trunk Assessment Upper Extremity Assessment RUE Deficits / Details: R weaker than L. residual from prior CVA. ROM and sensation is WFL RUE Sensation: WNL RUE Coordination: decreased fine motor LUE Deficits / Details: WNL   Lower Extremity Assessment Lower Extremity Assessment: Defer to PT evaluation        Vision   Vision Assessment?: No apparent visual deficits Additional Comments: possibly some L inattention?   Perception Perception Perception: Not tested   Praxis Praxis Praxis: Not tested    Cognition Arousal/Alertness: Awake/alert Behavior During Therapy: WFL for tasks assessed/performed Overall Cognitive Status: Impaired/Different from baseline Area of Impairment: Attention, Following commands, Safety/judgement, Awareness, Problem solving                   Current Attention Level: Sustained   Following Commands: Follows one step commands with increased time   Awareness: Emergent Problem Solving: Slow processing, Requires verbal cues, Difficulty sequencing, Requires tactile cues General Comments: pt requiring 1 person as lead instructor as pt has to really focus on instruction.        Exercises      Shoulder Instructions       General Comments VSS on RA. Son present & helpful    Pertinent Vitals/ Pain       Pain Assessment Pain Assessment: No/denies pain  Home Living                                          Prior Functioning/Environment              Frequency  Min 2X/week        Progress Toward Goals  OT Goals(current goals can now be found in the care plan section)  Progress towards OT goals: Progressing toward goals  Acute Rehab OT Goals Patient Stated Goal: get home OT Goal Formulation: With patient Time For Goal Achievement: 12/06/21 Potential to  Achieve Goals: Good ADL Goals Pt Will Perform Lower Body Bathing: with supervision;sit to/from stand Pt Will Perform Lower Body Dressing: with supervision;sit to/from stand Pt Will Transfer to Toilet: with supervision;ambulating Additional ADL Goal #1: Pt will independently complete bed mobility as a precursor to ADLs  Plan Discharge plan remains appropriate    Co-evaluation    PT/OT/SLP Co-Evaluation/Treatment: Yes Reason for Co-Treatment: To address functional/ADL transfers;For patient/therapist safety PT goals addressed during session: Mobility/safety with mobility OT goals addressed during session: ADL's and self-care      AM-PAC OT "6 Clicks" Daily Activity     Outcome Measure   Help from another person eating meals?: None Help from another person taking care of personal grooming?: A Little Help from another person toileting, which includes using toliet, bedpan, or urinal?: A Lot Help from another person bathing (including washing, rinsing, drying)?: A Lot Help from another person to put on and taking off regular upper body clothing?: A Little Help from another person to put on and taking off regular lower body clothing?: A Lot 6 Click Score:  16    End of Session Equipment Utilized During Treatment: Gait belt (slide board)  OT Visit Diagnosis: Other abnormalities of gait and mobility (R26.89);Unsteadiness on feet (R26.81);Muscle weakness (generalized) (M62.81);History of falling (Z91.81);Hemiplegia and hemiparesis Hemiplegia - caused by: Unspecified   Activity Tolerance Patient tolerated treatment well   Patient Left in chair;with call bell/phone within reach;with chair alarm set   Nurse Communication Mobility status (transfer technique)        Time: 4784-1282 OT Time Calculation (min): 26 min  Charges: OT General Charges $OT Visit: 1 Visit OT Treatments $Therapeutic Activity: 8-22 mins   Jaleeya Mcnelly A Vincent Ehrler 11/27/2021, 10:35 AM

## 2021-11-28 LAB — CBC WITH DIFFERENTIAL/PLATELET
Abs Immature Granulocytes: 0.57 10*3/uL — ABNORMAL HIGH (ref 0.00–0.07)
Basophils Absolute: 0 10*3/uL (ref 0.0–0.1)
Basophils Relative: 0 %
Eosinophils Absolute: 0 10*3/uL (ref 0.0–0.5)
Eosinophils Relative: 0 %
HCT: 37.3 % (ref 36.0–46.0)
Hemoglobin: 10.5 g/dL — ABNORMAL LOW (ref 12.0–15.0)
Immature Granulocytes: 5 %
Lymphocytes Relative: 15 %
Lymphs Abs: 1.7 10*3/uL (ref 0.7–4.0)
MCH: 19.7 pg — ABNORMAL LOW (ref 26.0–34.0)
MCHC: 28.2 g/dL — ABNORMAL LOW (ref 30.0–36.0)
MCV: 70 fL — ABNORMAL LOW (ref 80.0–100.0)
Monocytes Absolute: 0.9 10*3/uL (ref 0.1–1.0)
Monocytes Relative: 8 %
Neutro Abs: 8.1 10*3/uL — ABNORMAL HIGH (ref 1.7–7.7)
Neutrophils Relative %: 72 %
Platelets: 468 10*3/uL — ABNORMAL HIGH (ref 150–400)
RBC: 5.33 MIL/uL — ABNORMAL HIGH (ref 3.87–5.11)
RDW: 25.5 % — ABNORMAL HIGH (ref 11.5–15.5)
Smear Review: INCREASED
WBC: 11.3 10*3/uL — ABNORMAL HIGH (ref 4.0–10.5)
nRBC: 0.2 % (ref 0.0–0.2)

## 2021-11-28 LAB — BASIC METABOLIC PANEL
Anion gap: 10 (ref 5–15)
BUN: 27 mg/dL — ABNORMAL HIGH (ref 8–23)
CO2: 23 mmol/L (ref 22–32)
Calcium: 8.6 mg/dL — ABNORMAL LOW (ref 8.9–10.3)
Chloride: 102 mmol/L (ref 98–111)
Creatinine, Ser: 1.1 mg/dL — ABNORMAL HIGH (ref 0.44–1.00)
GFR, Estimated: 55 mL/min — ABNORMAL LOW (ref >60)
Glucose, Bld: 123 mg/dL — ABNORMAL HIGH (ref 70–99)
Potassium: 4.6 mmol/L (ref 3.5–5.1)
Sodium: 135 mmol/L (ref 135–145)

## 2021-11-28 LAB — GLUCOSE, CAPILLARY
Glucose-Capillary: 119 mg/dL — ABNORMAL HIGH (ref 70–99)
Glucose-Capillary: 134 mg/dL — ABNORMAL HIGH (ref 70–99)
Glucose-Capillary: 163 mg/dL — ABNORMAL HIGH (ref 70–99)
Glucose-Capillary: 126 mg/dL — ABNORMAL HIGH (ref 70–99)

## 2021-11-28 MED ORDER — DEXAMETHASONE 4 MG PO TABS
4.0000 mg | ORAL_TABLET | Freq: Every day | ORAL | Status: DC
  Administered 2021-11-29 – 2021-11-30 (×2): 4 mg via ORAL
  Filled 2021-11-28 (×2): qty 1

## 2021-11-28 MED ORDER — INSULIN ASPART 100 UNIT/ML IJ SOLN
0.0000 [IU] | Freq: Three times a day (TID) | INTRAMUSCULAR | Status: DC
  Administered 2021-11-28: 2 [IU] via SUBCUTANEOUS
  Administered 2021-11-28: 3 [IU] via SUBCUTANEOUS
  Administered 2021-11-29: 2 [IU] via SUBCUTANEOUS
  Administered 2021-11-30: 5 [IU] via SUBCUTANEOUS
  Administered 2021-11-30: 3 [IU] via SUBCUTANEOUS

## 2021-11-28 NOTE — Progress Notes (Signed)
Physical Therapy Treatment Patient Details Name: Brandi [name] MRN: [id number] DOB: May 28, 1956 Today's Date: 11/28/2021   History of Present Illness 66 y/o female presented to ED on 11/20/21 of bilateral LE weakness x 1 week and fall out of bed. CT head and MRI negative. 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 PMH: CVA, HTN, Diabetes    PT Comments    Pt progressing towards all goals. Pt continues with bilat LE weakness however continues to improve. Pt with improved assist for slide board transfer. Pt also with noted processing, comprehension, and motor planning difficulties requiring maximal verbal and tactile cues for all mobility. Continue to recommend AIR upon d/c for maximal functional recovery and family education on how to safely assist pt to decrease burden of care. Acute PT to cont to follow.    Recommendations for follow up therapy are one component of a multi-disciplinary discharge planning process, led by the attending physician.  Recommendations may be updated based on patient status, additional functional criteria and insurance authorization.  Follow Up Recommendations  Acute inpatient rehab (3hours/day)     Assistance Recommended at Discharge Frequent or constant Supervision/Assistance  Patient can return home with the following Two people to help with walking and/or transfers;Two people to help with bathing/dressing/bathroom;Assistance with cooking/housework;Direct supervision/assist for medications management;Direct supervision/assist for financial management;Assist for transportation;Help with stairs or ramp for entrance   Equipment Recommendations  Hospital bed;Wheelchair (measurements PT);Wheelchair cushion (measurements PT);Other (comment)    Recommendations for Other Services Rehab consult     Precautions / Restrictions Precautions Precautions: Fall Precaution Comments: body habitus Restrictions Weight  Bearing Restrictions: No     Mobility  Bed Mobility Overal bed mobility: Needs Assistance Bed Mobility: Rolling, Sidelying to Sit Rolling: Max assist Sidelying to sit: +2 for physical assistance, Max assist       General bed mobility comments: pt with motor planning and processing difficulties requiring max verbal and tactile cues, especially with reaching across body, max for trunk elevation and to scoot to EOB    Transfers Overall transfer level: Needs assistance Equipment used: Sliding board Transfers: Bed to chair/wheelchair/BSC            Lateral/Scoot Transfers: Mod assist, With slide board, +2 physical assistance (min physical assist but maximal verbal cues) General transfer comment: pt dependent for slide board placement, pt given step by step verbal cues to lean to the L to offweight R hip as pt trying to push self across board to the chair to her R. constant verbal cues to lean forward and away from the chair. pt with solid effort, minA x2 to aide with sliding, max directional verbal cues    Ambulation/Gait               General Gait Details: unable at this time   Stairs             Wheelchair Mobility    Modified Rankin (Stroke Patients Only)       Balance Overall balance assessment: Needs assistance Sitting-balance support: Feet supported, Single extremity supported Sitting balance-Leahy Scale: Fair Sitting balance - Comments: requires close min guard, verbal cues for optimal placement of L hand on bed for support Postural control: Posterior lean                                  Cognition Arousal/Alertness: Awake/alert Behavior During Therapy: [facility] for tasks  assessed/performed Overall Cognitive Status: Impaired/Different from baseline Area of Impairment: Attention, Following commands, Safety/judgement, Awareness, Problem solving                 Orientation Level: Disoriented to, Time Current Attention Level:  Sustained   Following Commands: Follows one step commands with increased time, Follows one step commands inconsistently (pt with very hard time with R and L, processing cross body cues like reach across with your R hand to the L hand rain requiring max verbal and tactile cues) Safety/Judgement: Decreased awareness of deficits, Decreased awareness of safety Awareness: Emergent Problem Solving: Slow processing, Requires verbal cues, Difficulty sequencing, Requires tactile cues General Comments: pt with noted processing difficulty requiring constant verbal cues and tactile cues to complete tasks. pt with poor STM ie. hand placement on slide board        Exercises General Exercises - Lower Extremity Quad Sets: AROM, Both, 10 reps, Supine (given as part of HEP) Gluteal Sets: AROM, Both, 10 reps, Seated (given as part of HEP) Long Arc Quad: AAROM, Both, 10 reps, Seated Heel Slides: AAROM, Both, 10 reps, Seated Heel Raises: AROM, Both, 10 reps, Seated (AA on the R LE)    General Comments General comments (skin integrity, edema, etc.): VSS on RA, son present, pt with +BM, dependent for hygiene      Pertinent Vitals/Pain Pain Assessment Pain Assessment: No/denies pain    Home Living                          Prior Function            PT Goals (current goals can now be found in the care plan section) Acute Rehab PT Goals Patient Stated Goal: to walk PT Goal Formulation: With patient/family Time For Goal Achievement: 12/06/21 Potential to Achieve Goals: Fair Progress towards PT goals: Progressing toward goals    Frequency    Min 4X/week      PT Plan Frequency needs to be updated    Co-evaluation              AM-PAC PT "6 Clicks" Mobility   Outcome Measure  Help needed turning from your back to your side while in a flat bed without using bedrails?: A Lot Help needed moving from lying on your back to sitting on the side of a flat bed without using bedrails?:  A Lot Help needed moving to and from a bed to a chair (including a wheelchair)?: A Lot Help needed standing up from a chair using your arms (e.g., wheelchair or bedside chair)?: Total Help needed to walk in hospital room?: Total Help needed climbing 3-5 steps with a railing? : Total 6 Click Score: 9    End of Session Equipment Utilized During Treatment: Gait belt Activity Tolerance: Patient tolerated treatment well Patient left: in chair;with call bell/phone within reach;with chair alarm set;with family/visitor present Nurse Communication: Mobility status;Need for lift equipment PT Visit Diagnosis: Other abnormalities of gait and mobility (R26.89);Muscle weakness (generalized) (M62.81);Other symptoms and signs involving the nervous system (R29.898)     Time: 4315-4008 PT Time Calculation (min) (ACUTE ONLY): 26 min  Charges:  $Therapeutic Exercise: 8-22 mins $Therapeutic Activity: 8-22 mins                     Kittie Plater, PT, DPT Acute Rehabilitation Services Pager #: [phone] Office #: [phone]    Berline Lopes 11/28/2021, 12:54 PM

## 2021-11-28 NOTE — Progress Notes (Signed)
PROGRESS NOTE  Brandi [name]  RXV:[id number] DOB: October 12, 1956 DOA: 11/20/2021 PCP: [name], MD   Brief Narrative: Brandi [name] is a 66 y.o. female with medical history significant of with history of diabetes mellitus type 2, hypertension, hyperlipidemia, stroke, presents the ED with a chief complaint of bilateral leg weakness.  Work-up MRI lumbar spine showed spondylosis and severe stenosis.  Neurology was consulted and was recommended to transfer to [facility] for further evaluation by neurosurgery service.  Started on Decadron,now being tapered.  Neurosurgery and neurology consulted.  Both have signed off, she is not a candidate for surgery.  PT/OT recommending CIR. waiting for bed   Assessment & Plan:  * Leg weakness, bilateral- (present on admission) -CT head, MRI and MRA of brain -although limited due to motion showed no acute intracranial changes. 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  -Neurosurgery consulted and  following, no plan for surgical intervention for now. -Neurosurgery suspecting possible other neurological cause for paraplegia, so neurology consulted.  No further recommendation from neurology, low suspicion for transverse myelitis or any other neurological problems -PT/OT recommending CIR on  Discharge -Patient has mild improvement in the strength on bilateral lower extremities.  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 -Continue treatment with steroids,start tapering -Patient  transferred to [facility] for further evaluation by neurosurgery. -MRI of lumbar, thoracic and cervical spine showed widespread degenerative spondylosis, canal and foraminal stenosis. At T3-T4,T4-T5.  there is a disc bulge with osteophytic ridging with moderate to severe canal stenosis  Obesity- (present on admission) BMI of 39.5  Leucocytosis This is likely from steroids.  Continue to monitor  Acute urinary retention Foley placed here.  Started on tamsulosin  History of CVA (cerebrovascular accident) -Continue risk factor modifications -Continue the use of aspirin and statins.   -No new intracranial abnormalities appreciated on head images.   -Uses cane for ambulation  Mixed hyperlipidemia- (present on admission) -Continue statin -Heart healthy diet discussed with patient.  DM type 2 (diabetes mellitus, type 2) (HCC) -Diet controlled -Hemoglobin A1c found to be 6 -Continue sliding scale insulin  Microcytic anemia- (present on admission) - Hemoglobin in the range of 8.  Iron studies showed severe iron deficiency. -Supplemented with  IV iron.  Patient also needs to be continued on oral iron therapy -No report of hematochezia or melena. 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  At that time colonoscopy and EGD were normal -She has chronic iron-deficiency anemia and her baseline hemoglobin is in the range of 8  HTN (hypertension)- (present on admission) -Continue hydralazine, lisinopril and metoprolol -Heart healthy diet discussed with patient. -Monitor blood pressure.  Currently normotensive  Hypokalemia-resolved as of 11/24/2021, (present on admission) - Supplemented and corrected              DVT prophylaxis:heparin injection 5,000 Units Start: 11/20/21 2200 SCDs Start: 11/20/21 2137     Code Status: Full Code  Family Communication: Son at bedside on 2/14  Patient status:Inpatient  Patient is from: Home  Anticipated discharge to:CIR   Consultants: Neurology, neurosurgery  Procedures: None  Antimicrobials:  Anti-infectives (From admission, onward)    None        Subjective:  Patient seen  and examined at the bedside this morning.  Hemodynamically stable.  Son at the bedside.  She looks comfortable.  She was sitting in the chair.  She denies any new complaints.  Improvement in the strength of bilateral lower extremities  Objective: Vitals:   11/27/21 2351 11/28/21 0400 11/28/21 0727 11/28/21 1112  BP: 115/77 (!) 104/49 138/73 110/86  Pulse: (!) 51 (!) 54 (!) 52 (!) 55  Resp: 16 14 18 18   Temp: 97.7 F (36.5 C) 97.6 F (36.4 C) 98.3 F (36.8 C) 98.2 F (36.8 C)  TempSrc: Oral Oral    SpO2: 100% 99% 100% 100%  Weight:      Height:        Intake/Output Summary (Last 24 hours) at 11/28/2021 1114 Last data filed at 11/28/2021 0100 Gross per 24 hour  Intake --  Output 800 ml  Net -800 ml   Filed Weights   11/20/21 1243  Weight: 111 kg    Examination:   General exam: Overall comfortable, not in distress,obese HEENT: PERRL Respiratory system:  no wheezes or crackles  Cardiovascular system: S1 & S2 heard, RRR.  Gastrointestinal system: Abdomen is nondistended, soft and nontender. Central nervous system: Alert and oriented, weakness in bilateral lower extremities: Strength: Left lower extremity:4/5 Right lower extremity:3/5 Extremities: No edema, no clubbing ,no cyanosis Skin: No rashes, no ulcers,no icterus   GU: Foley  Data Reviewed: I have personally reviewed following labs and imaging studies  CBC: Recent Labs  Lab 11/22/21 0803 11/23/21 0131 11/24/21 0109 11/25/21 0130 11/28/21 0419  WBC 10.3 11.1* 11.2* 13.2* 11.3*  NEUTROABS 8.7* 9.2* 8.1* 9.6* 8.1*  HGB 8.8* 9.0* 8.9* 9.8* 10.5*  HCT 30.3* 31.3* 31.2* 33.6* 37.3  MCV 65.4* 66.2* 66.1* 66.7* 70.0*  PLT 463* 493* 494* 505* 627*   Basic Metabolic Panel: Recent Labs  Lab 11/25/21 0130 11/28/21 0419  NA 137 135  K 4.3 4.6  CL 104 102  CO2 23 23  GLUCOSE 136* 123*  BUN 25* 27*  CREATININE 1.06* 1.10*  CALCIUM 8.7* 8.6*     Recent Results (from the  past 240 hour(s))  Resp Panel by RT-PCR (Flu A&B, Covid) Nasopharyngeal Swab     Status: None   Collection Time: 11/20/21  2:13 PM   Specimen: Nasopharyngeal Swab; Nasopharyngeal(NP) swabs in vial transport medium  Result Value Ref Range Status   SARS Coronavirus 2 by RT PCR NEGATIVE NEGATIVE Final    Comment: (NOTE) SARS-CoV-2 target nucleic acids are NOT DETECTED.  The SARS-CoV-2 RNA is generally detectable in upper respiratory specimens during the acute phase of infection. The lowest concentration of SARS-CoV-2 viral copies this assay can detect is 138 copies/mL. A negative result does not preclude SARS-Cov-2 infection and should not be used as the sole basis for treatment or other patient management decisions. A negative result may occur with  improper specimen collection/handling, submission of specimen other than nasopharyngeal swab, presence of viral mutation(s) within the areas targeted by this assay, and inadequate number of viral copies(<138 copies/mL). A negative result must be combined with clinical observations, patient history, and epidemiological information. The expected result is Negative.  Fact Sheet for Patients:  EntrepreneurPulse.com.au  Fact Sheet for Healthcare Providers:  IncredibleEmployment.be  This test is no t yet approved or cleared by the [place] FDA and  has been authorized for detection and/or diagnosis of SARS-CoV-2 by FDA under an Emergency Use Authorization (EUA). This EUA will remain  in effect (meaning this test can be used) for  the duration of the COVID-19 declaration under Section 564(b)(1) of the Act, 21 U.S.C.section 360bbb-3(b)(1), unless the authorization is terminated  or revoked sooner.       Influenza A by PCR NEGATIVE NEGATIVE Final   Influenza B by PCR NEGATIVE NEGATIVE Final    Comment: (NOTE) The Xpert Xpress SARS-CoV-2/FLU/RSV plus assay is intended as an aid in the diagnosis of  influenza from Nasopharyngeal swab specimens and should not be used as a sole basis for treatment. Nasal washings and aspirates are unacceptable for Xpert Xpress SARS-CoV-2/FLU/RSV testing.  Fact Sheet for Patients: EntrepreneurPulse.com.au  Fact Sheet for Healthcare Providers: IncredibleEmployment.be  This test is not yet approved or cleared by the [place] FDA and has been authorized for detection and/or diagnosis of SARS-CoV-2 by FDA under an Emergency Use Authorization (EUA). This EUA will remain in effect (meaning this test can be used) for the duration of the COVID-19 declaration under Section 564(b)(1) of the Act, 21 U.S.C. section 360bbb-3(b)(1), unless the authorization is terminated or revoked.  Performed at [facility], [address] [place], [place] [address]      Radiology Studies: No results found.  Scheduled Meds:  aspirin  325 mg Oral Daily   Chlorhexidine Gluconate Cloth  6 each Topical Daily   [START ON 11/29/2021] dexamethasone  4 mg Oral Daily   ferrous sulfate  325 mg Oral Q breakfast   heparin  5,000 Units Subcutaneous Q8H   hydrALAZINE  25 mg Oral Q8H   insulin aspart  0-15 Units Subcutaneous TID WC   lisinopril  20 mg Oral Daily   metoprolol tartrate  50 mg Oral BID   pantoprazole  40 mg Oral Daily   polyethylene glycol  17 g Oral Daily   senna  1 tablet Oral BID   simvastatin  20 mg Oral Daily   tamsulosin  0.4 mg Oral Daily   traZODone  150 mg Oral QHS   Continuous Infusions:  sodium chloride 10 mL/hr at 11/22/21 1151     LOS: 8 days   Shelly Coss, MD [facility] P2/14/2023, 11:14 AM

## 2021-11-28 NOTE — [place] FL2 (Signed)
[place] MEDICAID FL2 LEVEL OF CARE SCREENING TOOL     IDENTIFICATION  Patient Name: Brandi [name] Birthdate: Aug 24, 1956 Sex: female Admission Date (Current Location): 11/20/2021  [facility] and [place] Number:  [employer] and Address:  [facility], [place] [address], [place], [place] [address]      Provider Number: [id number]  Attending Physician Name and Address:  Shelly Coss, MD  Relative Name and Phone Number:       Current Level of Care: Hospital Recommended Level of Care: [place] Prior Approval Number:    Date Approved/Denied:   PASRR Number: [id number]  Discharge Plan: SNF    Current Diagnoses: Patient Active Problem List   Diagnosis Date Noted   Leucocytosis 11/25/2021   Acute urinary retention 11/23/2021   Leg weakness, bilateral 11/20/2021   Degenerative joint disease of spine 11/20/2021   History of CVA (cerebrovascular accident) 11/20/2021   Wheezing 10/24/2021   Anxiety 07/24/2021   Chronic kidney disease, stage 3a ([place]) 07/23/2021   Hypocapnia 07/23/2021   Iron deficiency anemia 07/23/2021   Morbid obesity ([place]) 06/16/2021   Pain in joint of right knee 05/25/2021   Acute maxillary sinusitis 05/25/2021   Chronic insomnia 05/25/2021   Mixed hyperlipidemia 05/25/2021   Seasonal allergic rhinitis 05/25/2021   Cerebrovascular accident ([place]) 05/25/2021   Essential hypertension 05/25/2021   Hypertensive disorder 05/25/2021   Small bowel mass    Symptomatic anemia 01/25/2017   Microcytic anemia 01/25/2017   DM type 2 (diabetes mellitus, type 2) ([place]) 01/25/2017   CVA (cerebral vascular accident) ([place]) 06/23/2012   HTN (hypertension) 06/23/2012   Obesity 06/23/2012   Expressive aphasia 06/23/2012   Tobacco abuse 06/23/2012    Orientation RESPIRATION BLADDER Height & Weight     Self, Time, Situation, Place  Normal Indwelling catheter Weight: 244 lb 11.4 oz (111 kg) Height:  5\' 6"  (167.6 cm)   BEHAVIORAL SYMPTOMS/MOOD NEUROLOGICAL BOWEL NUTRITION STATUS      Continent Diet (heart healthy)  AMBULATORY STATUS COMMUNICATION OF NEEDS Skin   Extensive Assist Verbally Normal                       Personal Care Assistance Level of Assistance  Bathing, Feeding, Dressing Bathing Assistance: Maximum assistance Feeding assistance: Limited assistance Dressing Assistance: Maximum assistance     Functional Limitations Info             SPECIAL CARE FACTORS FREQUENCY  PT (By licensed PT), OT (By licensed OT)     PT Frequency: 5x/wk OT Frequency: 5x/wk            Contractures Contractures Info: Not present    Additional Factors Info  Code Status, Allergies, Insulin Sliding Scale Code Status Info: Full Allergies Info: Codeine   Insulin Sliding Scale Info: see DC summary       Current Medications (11/28/2021):  This is the current hospital active medication list Current Facility-Administered Medications  Medication Dose Route Frequency Provider Last Rate Last Admin   0.9 %  sodium chloride infusion   Intravenous PRN Shelly Coss, MD 10 mL/hr at 11/22/21 1151 New Bag at 11/22/21 1151   acetaminophen (TYLENOL) tablet 650 mg  650 mg Oral Q6H PRN Zierle-Ghosh, Asia B, DO   650 mg at 11/27/21 1150   Or   acetaminophen (TYLENOL) suppository 650 mg  650 mg Rectal Q6H PRN Zierle-Ghosh, Asia B, DO       ALPRAZolam (XANAX) tablet 0.25 mg  0.25 mg Oral BID PRN Zierle-Ghosh, Asia B, DO   0.25 mg at 11/23/21 1728   aspirin tablet 325 mg  325 mg Oral Daily Zierle-Ghosh, Asia B, DO   325 mg at 11/28/21 0907   Chlorhexidine Gluconate Cloth 2 % PADS 6 each  6 each Topical Daily Shelly Coss, MD   6 each at 11/28/21 0909   [START ON 11/29/2021] dexamethasone (DECADRON) tablet 4 mg  4 mg Oral Daily Adhikari, Tamsen Meek, MD       ferrous sulfate tablet 325 mg  325 mg Oral Q breakfast Shelly Coss, MD   325 mg at 11/28/21 0907   heparin injection 5,000 Units  5,000 Units Subcutaneous  Q8H Zierle-Ghosh, Asia B, DO   5,000 Units at 11/28/21 1310   hydrALAZINE (APRESOLINE) tablet 25 mg  25 mg Oral Q8H Adhikari, Amrit, MD   25 mg at 11/28/21 1310   insulin aspart (novoLOG) injection 0-15 Units  0-15 Units Subcutaneous TID WC Shelly Coss, MD   2 Units at 11/28/21 1310   lisinopril (ZESTRIL) tablet 20 mg  20 mg Oral Daily Zierle-Ghosh, Asia B, DO   20 mg at 11/28/21 0907   metoprolol tartrate (LOPRESSOR) tablet 50 mg  50 mg Oral BID Opyd, Ilene Qua, MD   50 mg at 11/26/21 0900   ondansetron (ZOFRAN) tablet 4 mg  4 mg Oral Q6H PRN Zierle-Ghosh, Asia B, DO       Or   ondansetron (ZOFRAN) injection 4 mg  4 mg Intravenous Q6H PRN Zierle-Ghosh, Asia B, DO       oxyCODONE (Oxy IR/ROXICODONE) immediate release tablet 5 mg  5 mg Oral TID PRN Zierle-Ghosh, Asia B, DO   5 mg at 11/28/21 1225   pantoprazole (PROTONIX) EC tablet 40 mg  40 mg Oral Daily Shelly Coss, MD   40 mg at 11/28/21 0907   polyethylene glycol (MIRALAX / GLYCOLAX) packet 17 g  17 g Oral Daily Shelly Coss, MD   17 g at 11/27/21 0805   senna (SENOKOT) tablet 8.6 mg  1 tablet Oral BID Shelly Coss, MD   8.6 mg at 11/28/21 4656   simvastatin (ZOCOR) tablet 20 mg  20 mg Oral Daily Zierle-Ghosh, Asia B, DO   20 mg at 11/28/21 8127   tamsulosin (FLOMAX) capsule 0.4 mg  0.4 mg Oral Daily Adhikari, Amrit, MD   0.4 mg at 11/28/21 5170   traZODone (DESYREL) tablet 150 mg  150 mg Oral QHS Zierle-Ghosh, Asia B, DO   150 mg at 11/27/21 2110     Discharge Medications: Please see discharge summary for a list of discharge medications.  Relevant Imaging Results:  Relevant Lab Results:   Additional Information SS#: [id number]  Geralynn Ochs, LCSW

## 2021-11-28 NOTE — Progress Notes (Signed)
Inpatient Rehab Admissions Coordinator:   I do not yet have insurance auth or a CIR bed for this Pt. I will continue to follow for potential admit pending insurance auth.   Clemens Catholic, [place], [place] Admissions Coordinator  [phone] ([place]) [phone] (office)

## 2021-11-29 LAB — GLUCOSE, CAPILLARY
Glucose-Capillary: 95 mg/dL (ref 70–99)
Glucose-Capillary: 130 mg/dL — ABNORMAL HIGH (ref 70–99)
Glucose-Capillary: 118 mg/dL — ABNORMAL HIGH (ref 70–99)
Glucose-Capillary: 112 mg/dL — ABNORMAL HIGH (ref 70–99)

## 2021-11-29 NOTE — Progress Notes (Signed)
Inpatient Rehab Admissions Coordinator:   I do not have a bed for this pt. On CIR today. Await insurance appeal which was filed yesterday.   Clemens Catholic, [place], [place] Admissions Coordinator  [phone] ([place]) [phone] (office)

## 2021-11-29 NOTE — Progress Notes (Signed)
Foley clamped at 12:14, will reassess to see if foley can be removed

## 2021-11-29 NOTE — Progress Notes (Signed)
She feels like she is getting "better."  She does complain of some pain in the knees but this is chronic.  She complains of "pain in the butt."  When asked she states this is "in the crack."  She does not describe a sensory level in the thoracic region.  She still describes numbness from the left knee down to the foot.  Right hip flexor 3 out of 5, right knee extensor 4+ out of 5, right dorsiflexion 3 out of 5, right EHL 5 out of 5, right plantarflexion 4+ out of 5  Left hip flexor 4 out of 5, left knee extensor 4+ out of 5, left dorsiflexion 4+ out of 5, left plantarflexion 4+ out of 5, left EHL 5 out of 5  I still do not get a significant patellar reflex on either side, no clonus  Still unsure of the exact etiology of her weakness.  She has cervical thoracic and lumbar spine disease.  I do not believe this is from cervical disease as she has no upper extremity involvement.  I do not believe this is from the lumbar disease as I have never seen this presentation of acute dense paraparesis from lumbar spinal stenosis without some inciting event, and most of her weakness was paroxysmal and her severe stenosis starts at L3-4.  I do suspect that thoracic stenosis is involved, but again there is no inciting event or trauma or some incident to cause acute dense bilateral paraparesis without a sensory level.  And so it is difficult to advise thoracic laminectomy at T3-4 and T4-5 when she is neurologically improving, and surgery would involve risks that include neurologic compromise or paraplegia, especially given that is the watershed part of the spinal cord and therefore a little more risky area to operate.  Again, I have discussed this case with multiple partners and none of [place] feel that laminectomy is appropriate at this time.  I have discussed this with the patient as best that I can given her speech difficulties, but she seems to understand and appears to be pleased with the plan and appears to be thankful.

## 2021-11-29 NOTE — Progress Notes (Signed)
Physical Therapy Treatment Patient Details Name: Brandi [name] MRN: [id number] DOB: 08-Sep-1956 Today's Date: 11/29/2021   History of Present Illness 66 y/o female presented to ED on 11/20/21 of bilateral LE weakness x 1 week and fall out of bed. CT head and MRI negative. 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 PMH: CVA, HTN, Diabetes    PT Comments    Pt received in supine, agreeable to therapy session with excellent effort put forth and good tolerance for seated/supine LE exercise and seated balance tasks. Pt needing up to +2 maxA for lateral seated scoot to recliner with slide board due to motor planning and short term memory deficit. Pt needing max multimodal cues for LE exercises. Pt up in chair with lift pad in room, RN encouraged to use Stedy vs mechanical lift for safe return to supine due to difficulty following single step motor cues. Pt denies significant fatigue at end of session, food arriving and pt eager to eat. Pt continues to benefit from PT services to progress toward functional mobility goals. Continue to recommend AIR.  Recommendations for follow up therapy are one component of a multi-disciplinary discharge planning process, led by the attending physician.  Recommendations may be updated based on patient status, additional functional criteria and insurance authorization.  Follow Up Recommendations  Acute inpatient rehab (3hours/day)     Assistance Recommended at Discharge Frequent or constant Supervision/Assistance  Patient can return home with the following Two people to help with walking and/or transfers;Two people to help with bathing/dressing/bathroom;Assistance with cooking/housework;Direct supervision/assist for medications management;Direct supervision/assist for financial management;Assist for transportation;Help with stairs or ramp for entrance   Equipment Recommendations  Hospital bed;Wheelchair  (measurements PT);Wheelchair cushion (measurements PT);Other (comment)    Recommendations for Other Services Rehab consult     Precautions / Restrictions Precautions Precautions: Fall Precaution Comments: body habitus Restrictions Weight Bearing Restrictions: No     Mobility  Bed Mobility Overal bed mobility: Needs Assistance Bed Mobility: Rolling, Sidelying to Sit Rolling: Mod assist, +2 for physical assistance Sidelying to sit: +2 for physical assistance, Mod assist, HOB elevated       General bed mobility comments: pt with motor planning and processing difficulties requiring max verbal and tactile cues, especially with reaching across body, modA for trunk elevation and anterior scooting to foot flat    Transfers Overall transfer level: Needs assistance Equipment used: Sliding board Transfers: Bed to chair/wheelchair/BSC            Lateral/Scoot Transfers: With slide board, +2 physical assistance, Max assist General transfer comment: pt dependent for slide board placement, pt given step by step verbal cues to lean to the L to offweight R hip as pt trying to push self across board to the chair to her R. constant verbal cues to lean forward and away from the chair. pt with solid effort but difficulty motor planning ultimately needing maxA +2 to reach chair.    Ambulation/Gait               General Gait Details: unable at this time   Stairs             Wheelchair Mobility    Modified Rankin (Stroke Patients Only)       Balance Overall balance assessment: Needs assistance Sitting-balance support: Feet supported, Single extremity supported Sitting balance-Leahy Scale: Fair Sitting balance - Comments: requires close min guard, verbal cues for optimal placement of L hand on bed for support Postural  control: Posterior lean                                  Cognition Arousal/Alertness: Awake/alert Behavior During Therapy: WFL for tasks  assessed/performed Overall Cognitive Status: Impaired/Different from baseline Area of Impairment: Attention, Following commands, Safety/judgement, Awareness, Problem solving                 Orientation Level: Disoriented to, Time Current Attention Level: Sustained   Following Commands: Follows one step commands with increased time, Follows one step commands inconsistently (pt with very hard time with R and L, processing cross body cues like reach across with your R hand to the L hand rain requiring max verbal and tactile cues) Safety/Judgement: Decreased awareness of deficits, Decreased awareness of safety Awareness: Emergent Problem Solving: Slow processing, Requires verbal cues, Difficulty sequencing, Requires tactile cues General Comments: pt with noted processing difficulty requiring constant verbal cues and tactile cues to complete tasks. pt with poor STM ie. hand placement on slide board, seems to have motor planning issues, multimodal cues needed to complete all tasks. Eager to participate        Exercises General Exercises - Lower Extremity Long Arc Quad: AAROM, Both, 10 reps, Seated Heel Slides: AAROM, Both, 10 reps, Seated Hip ABduction/ADduction: AAROM, Both, 10 reps, Supine Hip Flexion/Marching: AROM, Both, 10 reps, Seated Heel Raises: AROM, Both, 10 reps, Seated    General Comments General comments (skin integrity, edema, etc.): VSS on RA      Pertinent Vitals/Pain Pain Assessment Pain Assessment: Faces Faces Pain Scale: Hurts a little bit Pain Location: bottom from positioning Pain Descriptors / Indicators: Discomfort Pain Intervention(s): Monitored during session, Repositioned     PT Goals (current goals can now be found in the care plan section) Acute Rehab PT Goals Patient Stated Goal: to walk PT Goal Formulation: With patient/family Time For Goal Achievement: 12/06/21 Progress towards PT goals: Progressing toward goals    Frequency    Min  4X/week      PT Plan Frequency needs to be updated       AM-PAC PT "6 Clicks" Mobility   Outcome Measure  Help needed turning from your back to your side while in a flat bed without using bedrails?: A Lot Help needed moving from lying on your back to sitting on the side of a flat bed without using bedrails?: A Lot Help needed moving to and from a bed to a chair (including a wheelchair)?: Total Help needed standing up from a chair using your arms (e.g., wheelchair or bedside chair)?: Total Help needed to walk in hospital room?: Total Help needed climbing 3-5 steps with a railing? : Total 6 Click Score: 8    End of Session Equipment Utilized During Treatment: Gait belt Activity Tolerance: Patient tolerated treatment well Patient left: in chair;with call bell/phone within reach;with chair alarm set Nurse Communication: Mobility status;Need for lift equipment;Other (comment) (brought lift pad to room, RN notified to use for staff safety) PT Visit Diagnosis: Other abnormalities of gait and mobility (R26.89);Muscle weakness (generalized) (M62.81);Other symptoms and signs involving the nervous system (R29.898)     Time: 8182-9937 PT Time Calculation (min) (ACUTE ONLY): 21 min  Charges:  $Therapeutic Activity: 8-22 mins                     Kenard Morawski P., PTA Acute Rehabilitation Services Pager: [phone] Office: [place]  Arelly Whittenberg 11/29/2021, 3:14 PM

## 2021-11-29 NOTE — Progress Notes (Signed)
PROGRESS NOTE  Brandi [name]  PNT:[id number] DOB: 05-28-1956 DOA: 11/20/2021 PCP: [name], MD   Brief Narrative: Brandi [name] is a 66 y.o. female with medical history significant of with history of diabetes mellitus type 2, hypertension, hyperlipidemia, stroke, presents the ED with a chief complaint of bilateral leg weakness.  Work-up MRI lumbar spine showed spondylosis and severe stenosis.  Neurology was consulted and was recommended to transfer to [facility] for further evaluation by neurosurgery service.  Started on Decadron,now being tapered.  Neurosurgery and neurology consulted.  Neurosurgery says she is not a candidate for surgery and recommending rehabilitation.  PT/OT recommending CIR, pending authorization.  Discharge planning to CIR versus skilled nursing facility.   Assessment & Plan:  * Leg weakness, bilateral- (present on admission) -CT head, MRI and MRA of brain -although limited due to motion showed no acute intracranial changes. 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  -Neurosurgery consulted and  following, no plan for surgical intervention for now. -Neurosurgery suspecting possible other neurological cause for paraplegia, so neurology consulted.  No further recommendation from neurology, low suspicion for transverse myelitis or any other neurological problems -PT/OT recommending CIR on  Discharge -Patient has mild improvement in the strength on bilateral lower extremities.  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 -Continue treatment with steroids,start tapering -Patient  transferred to [facility] for further evaluation by neurosurgery. -MRI of lumbar, thoracic and cervical  spine showed widespread degenerative spondylosis, canal and foraminal stenosis. At T3-T4,T4-T5. there is a disc bulge with osteophytic ridging with moderate to severe canal stenosis  Obesity- (present on admission) BMI of 39.5  Leucocytosis This is likely from steroids.  Continue to monitor  Acute urinary retention Foley placed here.  Started on tamsulosin.we will give voiding trial.She might need to be discharged on foley and follow up with urology of she continues to retain.  History of CVA (cerebrovascular accident) -Continue risk factor modifications -Continue the use of aspirin and statins.   -No new intracranial abnormalities appreciated on head images.   -Uses cane for ambulation  Mixed hyperlipidemia- (present on admission) -Continue statin -Heart healthy diet discussed with patient.  DM type 2 (diabetes mellitus, type 2) (HCC) -Diet controlled -Hemoglobin A1c found to be 6 -Continue sliding scale insulin  Microcytic anemia- (present on admission) - Hemoglobin in the range of 8.  Iron studies showed severe iron deficiency. -Supplemented with  IV iron.  Patient also needs to be continued on oral iron therapy -No report of hematochezia or melena. 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  At that time colonoscopy and EGD were normal -She has chronic iron-deficiency anemia and her baseline hemoglobin is in the range of 8  HTN (hypertension)- (present on admission) -Continue hydralazine, lisinopril and metoprolol -Heart healthy diet discussed with patient. -Monitor blood pressure.  Currently normotensive  Hypokalemia-resolved as of 11/24/2021, (present on admission) - Supplemented and corrected              DVT prophylaxis:heparin injection 5,000 Units Start: 11/20/21 2200 SCDs Start: 11/20/21 2137     Code Status: Full Code  Family Communication: Son at bedside on 2/14  Patient  status:Inpatient  Patient is from: Home  Anticipated discharge  to:CIR    Consultants: Neurology, neurosurgery  Procedures: None  Antimicrobials:  Anti-infectives (From admission, onward)    None       Subjective:  Patient seen and examined at the bedside this morning.  Hemodynamically stable.  No new changes from yesterday.  She states that she has been having improvement on the weakness of her bilateral lower extremities.  No new complaints  Objective: Vitals:   11/28/21 2338 11/29/21 0409 11/29/21 0811 11/29/21 1131  BP: (!) 104/40 98/72 128/60 102/62  Pulse: (!) 50 (!) 55 (!) 53 (!) 55  Resp: 16 16 18 18   Temp: 97.8 F (36.6 C) 98.1 F (36.7 C) 97.8 F (36.6 C) 98 F (36.7 C)  TempSrc: Oral Oral Oral   SpO2: 98% 100% 100% 99%  Weight:      Height:        Intake/Output Summary (Last 24 hours) at 11/29/2021 1204 Last data filed at 11/28/2021 2306 Gross per 24 hour  Intake --  Output 900 ml  Net -900 ml   Filed Weights   11/20/21 1243  Weight: 111 kg    Examination:  General exam: Overall comfortable, not in distress, morbidly obese, pleasant female HEENT: PERRL Respiratory system:  no wheezes or crackles  Cardiovascular system: S1 & S2 heard, RRR.  Gastrointestinal system: Abdomen is nondistended, soft and nontender. Central nervous system: Alert and oriented, weakness in bilateral lower extremities Strength: Left lower extremity:4/5 Right lower extremity:3/5 Extremities: No edema, no clubbing ,no cyanosis Skin: No rashes, no ulcers,no icterus   GU: foley  Data Reviewed: I have personally reviewed following labs and imaging studies  CBC: Recent Labs  Lab 11/23/21 0131 11/24/21 0109 11/25/21 0130 11/28/21 0419  WBC 11.1* 11.2* 13.2* 11.3*  NEUTROABS 9.2* 8.1* 9.6* 8.1*  HGB 9.0* 8.9* 9.8* 10.5*  HCT 31.3* 31.2* 33.6* 37.3  MCV 66.2* 66.1* 66.7* 70.0*  PLT 493* 494* 505* 371*   Basic Metabolic Panel: Recent Labs  Lab 11/25/21 0130  11/28/21 0419  NA 137 135  K 4.3 4.6  CL 104 102  CO2 23 23  GLUCOSE 136* 123*  BUN 25* 27*  CREATININE 1.06* 1.10*  CALCIUM 8.7* 8.6*     Recent Results (from the past 240 hour(s))  Resp Panel by RT-PCR (Flu A&B, Covid) Nasopharyngeal Swab     Status: None   Collection Time: 11/20/21  2:13 PM   Specimen: Nasopharyngeal Swab; Nasopharyngeal(NP) swabs in vial transport medium  Result Value Ref Range Status   SARS Coronavirus 2 by RT PCR NEGATIVE NEGATIVE Final    Comment: (NOTE) SARS-CoV-2 target nucleic acids are NOT DETECTED.  The SARS-CoV-2 RNA is generally detectable in upper respiratory specimens during the acute phase of infection. The lowest concentration of SARS-CoV-2 viral copies this assay can detect is 138 copies/mL. A negative result does not preclude SARS-Cov-2 infection and should not be used as the sole basis for treatment or other patient management decisions. A negative result may occur with  improper specimen collection/handling, submission of specimen other than nasopharyngeal swab, presence of viral mutation(s) within the areas targeted by this assay, and inadequate number of viral copies(<138 copies/mL). A negative result must be combined with clinical observations, patient history, and epidemiological information. The expected result is Negative.  Fact Sheet for Patients:  EntrepreneurPulse.com.au  Fact Sheet for Healthcare Providers:  IncredibleEmployment.be  This test is no t yet approved or cleared by the [place] FDA and  has been authorized for detection and/or diagnosis of SARS-CoV-2  by FDA under an Emergency Use Authorization (EUA). This EUA will remain  in effect (meaning this test can be used) for the duration of the COVID-19 declaration under Section 564(b)(1) of the Act, 21 U.S.C.section 360bbb-3(b)(1), unless the authorization is terminated  or revoked sooner.       Influenza A by PCR NEGATIVE  NEGATIVE Final   Influenza B by PCR NEGATIVE NEGATIVE Final    Comment: (NOTE) The Xpert Xpress SARS-CoV-2/FLU/RSV plus assay is intended as an aid in the diagnosis of influenza from Nasopharyngeal swab specimens and should not be used as a sole basis for treatment. Nasal washings and aspirates are unacceptable for Xpert Xpress SARS-CoV-2/FLU/RSV testing.  Fact Sheet for Patients: EntrepreneurPulse.com.au  Fact Sheet for Healthcare Providers: IncredibleEmployment.be  This test is not yet approved or cleared by the [place] FDA and has been authorized for detection and/or diagnosis of SARS-CoV-2 by FDA under an Emergency Use Authorization (EUA). This EUA will remain in effect (meaning this test can be used) for the duration of the COVID-19 declaration under Section 564(b)(1) of the Act, 21 U.S.C. section 360bbb-3(b)(1), unless the authorization is terminated or revoked.  Performed at [facility], [address] [place], [place] [address]      Radiology Studies: No results found.  Scheduled Meds:  aspirin  325 mg Oral Daily   Chlorhexidine Gluconate Cloth  6 each Topical Daily   dexamethasone  4 mg Oral Daily   ferrous sulfate  325 mg Oral Q breakfast   heparin  5,000 Units Subcutaneous Q8H   hydrALAZINE  25 mg Oral Q8H   insulin aspart  0-15 Units Subcutaneous TID WC   lisinopril  20 mg Oral Daily   metoprolol tartrate  50 mg Oral BID   pantoprazole  40 mg Oral Daily   polyethylene glycol  17 g Oral Daily   senna  1 tablet Oral BID   simvastatin  20 mg Oral Daily   tamsulosin  0.4 mg Oral Daily   traZODone  150 mg Oral QHS   Continuous Infusions:  sodium chloride 10 mL/hr at 11/22/21 1151     LOS: 9 days   Shelly Coss, MD [facility] P2/15/2023, 12:04 PM

## 2021-11-30 ENCOUNTER — Encounter (HOSPITAL_COMMUNITY): Payer: Self-pay | Admitting: Physical Medicine and Rehabilitation

## 2021-11-30 ENCOUNTER — Other Ambulatory Visit: Payer: Self-pay

## 2021-11-30 ENCOUNTER — Inpatient Hospital Stay (HOSPITAL_COMMUNITY)
Admission: RE | Admit: 2021-11-30 | Discharge: 2021-12-26 | DRG: 945 | Disposition: A | Discharge status: Home Health | Payer: Medicare Other | Source: Intra-hospital | Attending: Physical Medicine and Rehabilitation | Admitting: Physical Medicine and Rehabilitation

## 2021-11-30 DIAGNOSIS — E1165 Type 2 diabetes mellitus with hyperglycemia: Secondary | ICD-10-CM

## 2021-11-30 DIAGNOSIS — R5381 Other malaise: Principal | ICD-10-CM | POA: Diagnosis present

## 2021-11-30 DIAGNOSIS — Z79899 Other long term (current) drug therapy: Secondary | ICD-10-CM | POA: Diagnosis not present

## 2021-11-30 DIAGNOSIS — Z6839 Body mass index (BMI) 39.0-39.9, adult: Secondary | ICD-10-CM | POA: Diagnosis not present

## 2021-11-30 DIAGNOSIS — E785 Hyperlipidemia, unspecified: Secondary | ICD-10-CM | POA: Diagnosis not present

## 2021-11-30 DIAGNOSIS — I69320 Aphasia following cerebral infarction: Secondary | ICD-10-CM

## 2021-11-30 DIAGNOSIS — R339 Retention of urine, unspecified: Secondary | ICD-10-CM | POA: Diagnosis present

## 2021-11-30 DIAGNOSIS — K592 Neurogenic bowel, not elsewhere classified: Secondary | ICD-10-CM | POA: Diagnosis present

## 2021-11-30 DIAGNOSIS — K5900 Constipation, unspecified: Secondary | ICD-10-CM | POA: Diagnosis not present

## 2021-11-30 DIAGNOSIS — M1711 Unilateral primary osteoarthritis, right knee: Secondary | ICD-10-CM | POA: Diagnosis not present

## 2021-11-30 DIAGNOSIS — Z8679 Personal history of other diseases of the circulatory system: Secondary | ICD-10-CM | POA: Diagnosis not present

## 2021-11-30 DIAGNOSIS — Z8673 Personal history of transient ischemic attack (TIA), and cerebral infarction without residual deficits: Secondary | ICD-10-CM | POA: Diagnosis not present

## 2021-11-30 DIAGNOSIS — S30810A Abrasion of lower back and pelvis, initial encounter: Secondary | ICD-10-CM | POA: Diagnosis not present

## 2021-11-30 DIAGNOSIS — Z7401 Bed confinement status: Secondary | ICD-10-CM | POA: Diagnosis not present

## 2021-11-30 DIAGNOSIS — Z87891 Personal history of nicotine dependence: Secondary | ICD-10-CM

## 2021-11-30 DIAGNOSIS — R001 Bradycardia, unspecified: Secondary | ICD-10-CM | POA: Diagnosis present

## 2021-11-30 DIAGNOSIS — I129 Hypertensive chronic kidney disease with stage 1 through stage 4 chronic kidney disease, or unspecified chronic kidney disease: Secondary | ICD-10-CM | POA: Diagnosis present

## 2021-11-30 DIAGNOSIS — I10 Essential (primary) hypertension: Secondary | ICD-10-CM | POA: Diagnosis not present

## 2021-11-30 DIAGNOSIS — E1122 Type 2 diabetes mellitus with diabetic chronic kidney disease: Secondary | ICD-10-CM | POA: Diagnosis present

## 2021-11-30 DIAGNOSIS — G959 Disease of spinal cord, unspecified: Secondary | ICD-10-CM | POA: Diagnosis present

## 2021-11-30 DIAGNOSIS — Z885 Allergy status to narcotic agent status: Secondary | ICD-10-CM | POA: Diagnosis not present

## 2021-11-30 DIAGNOSIS — N319 Neuromuscular dysfunction of bladder, unspecified: Secondary | ICD-10-CM | POA: Diagnosis not present

## 2021-11-30 DIAGNOSIS — Z833 Family history of diabetes mellitus: Secondary | ICD-10-CM

## 2021-11-30 DIAGNOSIS — N179 Acute kidney failure, unspecified: Secondary | ICD-10-CM

## 2021-11-30 DIAGNOSIS — M5104 Intervertebral disc disorders with myelopathy, thoracic region: Secondary | ICD-10-CM | POA: Diagnosis not present

## 2021-11-30 DIAGNOSIS — Z8249 Family history of ischemic heart disease and other diseases of the circulatory system: Secondary | ICD-10-CM | POA: Diagnosis not present

## 2021-11-30 DIAGNOSIS — M7989 Other specified soft tissue disorders: Secondary | ICD-10-CM | POA: Diagnosis not present

## 2021-11-30 DIAGNOSIS — D509 Iron deficiency anemia, unspecified: Secondary | ICD-10-CM | POA: Diagnosis present

## 2021-11-30 DIAGNOSIS — N1831 Chronic kidney disease, stage 3a: Secondary | ICD-10-CM | POA: Diagnosis present

## 2021-11-30 DIAGNOSIS — Z7982 Long term (current) use of aspirin: Secondary | ICD-10-CM

## 2021-11-30 DIAGNOSIS — E871 Hypo-osmolality and hyponatremia: Secondary | ICD-10-CM | POA: Diagnosis not present

## 2021-11-30 DIAGNOSIS — I959 Hypotension, unspecified: Secondary | ICD-10-CM | POA: Diagnosis not present

## 2021-11-30 DIAGNOSIS — E119 Type 2 diabetes mellitus without complications: Secondary | ICD-10-CM | POA: Diagnosis not present

## 2021-11-30 DIAGNOSIS — E1169 Type 2 diabetes mellitus with other specified complication: Secondary | ICD-10-CM | POA: Diagnosis not present

## 2021-11-30 DIAGNOSIS — M170 Bilateral primary osteoarthritis of knee: Secondary | ICD-10-CM | POA: Diagnosis present

## 2021-11-30 DIAGNOSIS — R29898 Other symptoms and signs involving the musculoskeletal system: Secondary | ICD-10-CM | POA: Diagnosis not present

## 2021-11-30 DIAGNOSIS — E669 Obesity, unspecified: Secondary | ICD-10-CM | POA: Diagnosis not present

## 2021-11-30 DIAGNOSIS — R531 Weakness: Secondary | ICD-10-CM | POA: Diagnosis not present

## 2021-11-30 DIAGNOSIS — R609 Edema, unspecified: Secondary | ICD-10-CM | POA: Diagnosis not present

## 2021-11-30 DIAGNOSIS — L8990 Pressure ulcer of unspecified site, unspecified stage: Secondary | ICD-10-CM | POA: Insufficient documentation

## 2021-11-30 LAB — GLUCOSE, CAPILLARY
Glucose-Capillary: 90 mg/dL (ref 70–99)
Glucose-Capillary: 241 mg/dL — ABNORMAL HIGH (ref 70–99)
Glucose-Capillary: 154 mg/dL — ABNORMAL HIGH (ref 70–99)
Glucose-Capillary: 130 mg/dL — ABNORMAL HIGH (ref 70–99)

## 2021-11-30 LAB — CBC WITH DIFFERENTIAL/PLATELET
Abs Immature Granulocytes: 0.27 10*3/uL — ABNORMAL HIGH (ref 0.00–0.07)
Basophils Absolute: 0 10*3/uL (ref 0.0–0.1)
Basophils Relative: 0 %
Eosinophils Absolute: 0.1 10*3/uL (ref 0.0–0.5)
Eosinophils Relative: 1 %
HCT: 36 % (ref 36.0–46.0)
Hemoglobin: 10.5 g/dL — ABNORMAL LOW (ref 12.0–15.0)
Immature Granulocytes: 2 %
Lymphocytes Relative: 22 %
Lymphs Abs: 2.8 10*3/uL (ref 0.7–4.0)
MCH: 20.5 pg — ABNORMAL LOW (ref 26.0–34.0)
MCHC: 29.2 g/dL — ABNORMAL LOW (ref 30.0–36.0)
MCV: 70.2 fL — ABNORMAL LOW (ref 80.0–100.0)
Monocytes Absolute: 1 10*3/uL (ref 0.1–1.0)
Monocytes Relative: 8 %
Neutro Abs: 8.7 10*3/uL — ABNORMAL HIGH (ref 1.7–7.7)
Neutrophils Relative %: 67 %
Platelets: 431 10*3/uL — ABNORMAL HIGH (ref 150–400)
RBC: 5.13 MIL/uL — ABNORMAL HIGH (ref 3.87–5.11)
RDW: 26.6 % — ABNORMAL HIGH (ref 11.5–15.5)
WBC: 12.8 10*3/uL — ABNORMAL HIGH (ref 4.0–10.5)
nRBC: 0 % (ref 0.0–0.2)

## 2021-11-30 LAB — BASIC METABOLIC PANEL
Anion gap: 10 (ref 5–15)
BUN: 28 mg/dL — ABNORMAL HIGH (ref 8–23)
CO2: 23 mmol/L (ref 22–32)
Calcium: 8.8 mg/dL — ABNORMAL LOW (ref 8.9–10.3)
Chloride: 103 mmol/L (ref 98–111)
Creatinine, Ser: 1.1 mg/dL — ABNORMAL HIGH (ref 0.44–1.00)
GFR, Estimated: 55 mL/min — ABNORMAL LOW (ref >60)
Glucose, Bld: 94 mg/dL (ref 70–99)
Potassium: 4.5 mmol/L (ref 3.5–5.1)
Sodium: 136 mmol/L (ref 135–145)

## 2021-11-30 MED ORDER — HEPARIN SODIUM (PORCINE) 5000 UNIT/ML IJ SOLN
5000.0000 [IU] | Freq: Three times a day (TID) | INTRAMUSCULAR | Status: DC
  Administered 2021-11-30 – 2021-12-26 (×78): 5000 [IU] via SUBCUTANEOUS
  Filled 2021-11-30 (×79): qty 1

## 2021-11-30 MED ORDER — SIMVASTATIN 20 MG PO TABS
20.0000 mg | ORAL_TABLET | Freq: Every day | ORAL | Status: DC
  Administered 2021-12-01 – 2021-12-25 (×25): 20 mg via ORAL
  Filled 2021-11-30 (×26): qty 1

## 2021-11-30 MED ORDER — LISINOPRIL 2.5 MG PO TABS: 5.0000 mg | ORAL_TABLET | Freq: Every day | ORAL | Status: DC

## 2021-11-30 MED ORDER — SENNA 8.6 MG PO TABS: 1.0000 | ORAL_TABLET | Freq: Two times a day (BID) | ORAL | 0 refills | Status: DC

## 2021-11-30 MED ORDER — OXYCODONE HCL 5 MG PO TABS
5.0000 mg | ORAL_TABLET | Freq: Three times a day (TID) | ORAL | Status: DC | PRN
  Administered 2021-11-30 – 2021-12-04 (×9): 5 mg via ORAL
  Filled 2021-11-30 (×9): qty 1

## 2021-11-30 MED ORDER — PANTOPRAZOLE SODIUM 40 MG PO TBEC
40.0000 mg | DELAYED_RELEASE_TABLET | Freq: Every day | ORAL | Status: DC
  Administered 2021-12-01 – 2021-12-26 (×26): 40 mg via ORAL
  Filled 2021-11-30 (×26): qty 1

## 2021-11-30 MED ORDER — POLYETHYLENE GLYCOL 3350 17 G PO PACK: 17.0000 g | PACK | Freq: Every day | ORAL | 0 refills | Status: DC

## 2021-11-30 MED ORDER — PANTOPRAZOLE SODIUM 40 MG PO TBEC: 40.0000 mg | DELAYED_RELEASE_TABLET | Freq: Every day | ORAL | Status: DC

## 2021-11-30 MED ORDER — ONDANSETRON HCL 4 MG PO TABS
4.0000 mg | ORAL_TABLET | Freq: Four times a day (QID) | ORAL | Status: DC | PRN
  Administered 2021-12-18 – 2021-12-25 (×2): 4 mg via ORAL
  Filled 2021-11-30 (×5): qty 1

## 2021-11-30 MED ORDER — HEPARIN SODIUM (PORCINE) 5000 UNIT/ML IJ SOLN: 5000.0000 [IU] | Freq: Three times a day (TID) | INTRAMUSCULAR | Status: DC

## 2021-11-30 MED ORDER — DEXAMETHASONE 4 MG PO TABS
4.0000 mg | ORAL_TABLET | Freq: Every day | ORAL | Status: AC
  Administered 2021-12-01: 4 mg via ORAL
  Filled 2021-11-30: qty 1

## 2021-11-30 MED ORDER — INSULIN ASPART 100 UNIT/ML IJ SOLN
0.0000 [IU] | Freq: Three times a day (TID) | INTRAMUSCULAR | Status: DC
  Administered 2021-12-01 – 2021-12-04 (×6): 2 [IU] via SUBCUTANEOUS
  Administered 2021-12-04: 3 [IU] via SUBCUTANEOUS
  Administered 2021-12-06 – 2021-12-26 (×9): 2 [IU] via SUBCUTANEOUS

## 2021-11-30 MED ORDER — ACETAMINOPHEN 325 MG PO TABS
650.0000 mg | ORAL_TABLET | Freq: Four times a day (QID) | ORAL | Status: DC | PRN
  Administered 2021-12-01 – 2021-12-26 (×20): 650 mg via ORAL
  Filled 2021-11-30 (×22): qty 2

## 2021-11-30 MED ORDER — ALPRAZOLAM 0.25 MG PO TABS
0.2500 mg | ORAL_TABLET | Freq: Two times a day (BID) | ORAL | Status: DC | PRN
  Administered 2021-12-03 – 2021-12-26 (×22): 0.25 mg via ORAL
  Filled 2021-11-30 (×22): qty 1

## 2021-11-30 MED ORDER — METOPROLOL TARTRATE 25 MG PO TABS: 12.5000 mg | ORAL_TABLET | Freq: Two times a day (BID) | ORAL | Status: DC

## 2021-11-30 MED ORDER — TRAZODONE HCL 50 MG PO TABS
150.0000 mg | ORAL_TABLET | Freq: Every day | ORAL | Status: DC
  Administered 2021-11-30 – 2021-12-25 (×26): 150 mg via ORAL
  Filled 2021-11-30 (×26): qty 3

## 2021-11-30 MED ORDER — METOPROLOL TARTRATE 25 MG PO TABS: 25.0000 mg | ORAL_TABLET | Freq: Two times a day (BID) | ORAL | Status: DC

## 2021-11-30 MED ORDER — DEXAMETHASONE 4 MG PO TABS: 4.0000 mg | ORAL_TABLET | Freq: Every day | ORAL | 0 refills | Status: DC

## 2021-11-30 MED ORDER — ASPIRIN 325 MG PO TABS
325.0000 mg | ORAL_TABLET | Freq: Every day | ORAL | Status: DC
  Administered 2021-12-01 – 2021-12-26 (×26): 325 mg via ORAL
  Filled 2021-11-30 (×26): qty 1

## 2021-11-30 MED ORDER — POLYETHYLENE GLYCOL 3350 17 G PO PACK
17.0000 g | PACK | Freq: Every day | ORAL | Status: DC
  Administered 2021-12-01 – 2021-12-22 (×15): 17 g via ORAL
  Filled 2021-11-30 (×21): qty 1

## 2021-11-30 MED ORDER — HYDRALAZINE HCL 25 MG PO TABS
25.0000 mg | ORAL_TABLET | Freq: Three times a day (TID) | ORAL | Status: DC
  Administered 2021-12-01 – 2021-12-04 (×8): 25 mg via ORAL
  Filled 2021-11-30 (×14): qty 1

## 2021-11-30 MED ORDER — ONDANSETRON HCL 4 MG/2ML IJ SOLN
4.0000 mg | Freq: Four times a day (QID) | INTRAMUSCULAR | Status: DC | PRN
  Filled 2021-11-30: qty 2

## 2021-11-30 MED ORDER — TAMSULOSIN HCL 0.4 MG PO CAPS
0.4000 mg | ORAL_CAPSULE | Freq: Every day | ORAL | Status: DC
  Administered 2021-12-01 – 2021-12-26 (×26): 0.4 mg via ORAL
  Filled 2021-11-30 (×26): qty 1

## 2021-11-30 MED ORDER — ACETAMINOPHEN 650 MG RE SUPP: 650.0000 mg | Freq: Four times a day (QID) | RECTAL | Status: DC | PRN

## 2021-11-30 MED ORDER — FERROUS SULFATE 325 (65 FE) MG PO TABS
325.0000 mg | ORAL_TABLET | Freq: Every day | ORAL | Status: DC
  Administered 2021-12-01 – 2021-12-26 (×26): 325 mg via ORAL
  Filled 2021-11-30 (×26): qty 1

## 2021-11-30 MED ORDER — SENNA 8.6 MG PO TABS
1.0000 | ORAL_TABLET | Freq: Two times a day (BID) | ORAL | Status: DC
  Administered 2021-11-30 – 2021-12-03 (×7): 8.6 mg via ORAL
  Filled 2021-11-30 (×8): qty 1

## 2021-11-30 MED ORDER — METOPROLOL TARTRATE 25 MG PO TABS
25.0000 mg | ORAL_TABLET | Freq: Two times a day (BID) | ORAL | Status: DC
  Filled 2021-11-30 (×2): qty 1

## 2021-11-30 NOTE — Progress Notes (Signed)
Brandi Staggers, MD  Physician CASE MANAGEMENT PMR Pre-admission     Signed Date of Service:  11/25/2021  4:19 PM  Related encounter: ED to Hosp-Admission (Current) from 11/20/2021 in [facility]   Signed                                                                                                                                                                                                                                                                                                                                                                                                                                                                                                PMR Admission Coordinator Pre-Admission Assessment   Patient: Brandi [name] is an 66 y.o., female MRN: [id number] DOB: May 16, 1956 Height: $RemoveBefo'5\' 6"'wLBjnigtlIB$  (167.6 cm) Weight: 111 kg   Insurance Information HMO: Yes - POS    PPO:      PCP:      IPA:      80/20:      OTHER: Group [id number] PRIMARY: [employer] Medicare      Policy#: [id number]  Subscriber: self CM Name:       Phone#:      Fax#: [phone] Pre-Cert#: [id number] denial overturned and Approved on 11/30/21      Employer: Not employed Benefits:  Phone #: [phone]     Name: [email or web address] online Eff. Date: 10/15/21     Deduct: 40      Out of Pocket Max: $4500      Life Max: N/A CIR: $325 for days 1-5      SNF: $0 days 1-20; $196 days 21-43; $0 days 44-100 Outpatient: med nec     Co-Pay: $20 copay/visit Home Health: 100%      Co-Pay: none DME: 80%     Co-Pay: 20% Providers: in network  SECONDARY:       Policy#:      Phone#:    Artist:       Phone#:    The Best boy for patients in Inpatient Rehabilitation Facilities with attached Privacy Act  Statement-Health Care Records was provided and verbally reviewed with: Patient   Emergency Contact Information Contact Information       Name Relation Home Work Mobile    [place] Son [phone]   [phone]    Terrilyn Saver     [phone]           Current Medical History  Patient Admitting Diagnosis:  LE Leg weakness   History of Present Illness: A 66 y.o. female with a medical history significant for morbid obesity with a BMI of 39.5 kg/m2, anemia, type 2 diabetes mellitus, stroke with some reported residual right hand weakness, and essential hypertension who presented to [facility] ED on 2/6 for bilateral lower extremity weakness.  Patient endorses that about 2 weeks ago she had tingling in her left foot with "nerve pain" that has been intermittent since.  In her usual state of health, patient is able to ambulate in her home using a cane to use the restroom independently.  Patient's son states that on Monday 2/6 he was attempting to give her a bath on the side of the bed and noticed that she had bilateral leg numbness and weakness in addition to being unable to sit up on her own with imbalance.  He states that she started to slip off the bed and "her legs were like spaghetti" and she was unable to support her weight and slid to the ground prompting him to call EMS.  Patient's son reports that she has had back problems for many years and has also had knee issues for at least 18 years but denies any history of similar episodes of lower extremity weakness and numbness and notes that she does use a cane for ambulation at home.  Patient was given steroids during hospitalization with reported improvement in her lower extremity numbness and sensory deficits with reported residual left foot nerve pain and right lower extremity weakness. Further work up reveals imaging significant for severe spinal stenosis at L3-4 and L4-5. PT/OT evaluations were completed with recommendations for  acute inpatient rehab admission.   Patient's medical record from [facility] has been reviewed by the rehabilitation admission coordinator and physician.   Past Medical History      Past Medical History:  Diagnosis Date   Anemia     Diabetes mellitus without complication (HCC)     Hypertension     Stroke [facility]) 2013      Has the patient had major surgery during 100 days prior to admission? No  Family History   family history includes Heart attack in her father; Ulcers in her mother.   Current Medications   Current Facility-Administered Medications:    0.9 %  sodium chloride infusion, , Intravenous, PRN, Shelly Coss, MD, Last Rate: 10 mL/hr at 11/22/21 1151, New Bag at 11/22/21 1151   acetaminophen (TYLENOL) tablet 650 mg, 650 mg, Oral, Q6H PRN, 650 mg at 11/29/21 1438 **OR** acetaminophen (TYLENOL) suppository 650 mg, 650 mg, Rectal, Q6H PRN, Zierle-Ghosh, Asia B, DO   ALPRAZolam (XANAX) tablet 0.25 mg, 0.25 mg, Oral, BID PRN, Zierle-Ghosh, Asia B, DO, 0.25 mg at 11/23/21 1728   aspirin tablet 325 mg, 325 mg, Oral, Daily, Zierle-Ghosh, Asia B, DO, 325 mg at 11/30/21 0905   Chlorhexidine Gluconate Cloth 2 % PADS 6 each, 6 each, Topical, Daily, Adhikari, Amrit, MD, 6 each at 11/30/21 0907   dexamethasone (DECADRON) tablet 4 mg, 4 mg, Oral, Daily, Adhikari, Amrit, MD, 4 mg at 11/30/21 0905   ferrous sulfate tablet 325 mg, 325 mg, Oral, Q breakfast, Adhikari, Amrit, MD, 325 mg at 11/30/21 0905   heparin injection 5,000 Units, 5,000 Units, Subcutaneous, Q8H, Zierle-Ghosh, Asia B, DO, 5,000 Units at 11/30/21 8182   hydrALAZINE (APRESOLINE) tablet 25 mg, 25 mg, Oral, Q8H, Adhikari, Amrit, MD, 25 mg at 11/30/21 9937   insulin aspart (novoLOG) injection 0-15 Units, 0-15 Units, Subcutaneous, TID WC, Adhikari, Amrit, MD, 2 Units at 11/29/21 1258   metoprolol tartrate (LOPRESSOR) tablet 25 mg, 25 mg, Oral, BID, Adhikari, Amrit, MD   ondansetron (ZOFRAN) tablet 4 mg, 4 mg, Oral, Q6H PRN  **OR** ondansetron (ZOFRAN) injection 4 mg, 4 mg, Intravenous, Q6H PRN, Zierle-Ghosh, Asia B, DO   oxyCODONE (Oxy IR/ROXICODONE) immediate release tablet 5 mg, 5 mg, Oral, TID PRN, Zierle-Ghosh, Asia B, DO, 5 mg at 11/30/21 1049   pantoprazole (PROTONIX) EC tablet 40 mg, 40 mg, Oral, Daily, Adhikari, Amrit, MD, 40 mg at 11/30/21 0905   polyethylene glycol (MIRALAX / GLYCOLAX) packet 17 g, 17 g, Oral, Daily, Adhikari, Amrit, MD, 17 g at 11/30/21 1696   senna (SENOKOT) tablet 8.6 mg, 1 tablet, Oral, BID, Adhikari, Amrit, MD, 8.6 mg at 11/30/21 0905   simvastatin (ZOCOR) tablet 20 mg, 20 mg, Oral, Daily, Zierle-Ghosh, Asia B, DO, 20 mg at 11/30/21 0905   tamsulosin (FLOMAX) capsule 0.4 mg, 0.4 mg, Oral, Daily, Adhikari, Amrit, MD, 0.4 mg at 11/30/21 7893   traZODone (DESYREL) tablet 150 mg, 150 mg, Oral, QHS, Zierle-Ghosh, Asia B, DO, 150 mg at 11/29/21 2201   Patients Current Diet:  Diet Order                  Diet Heart Room service appropriate? Yes; Fluid consistency: Thin  Diet effective now                         Precautions / Restrictions Precautions Precautions: Fall Precaution Comments: body habitus Restrictions Weight Bearing Restrictions: No    Has the patient had 2 or more falls or a fall with injury in the past year? No   Prior Activity Level Limited Community (1-2x/wk): Went out weekly to church on Sundays and to MD appointments.   Prior Functional Level Self Care: Did the patient need help bathing, dressing, using the toilet or eating? Independent   Indoor Mobility: Did the patient need assistance with walking from room to room (with or without device)? Independent   Stairs: Did the patient need assistance with internal or external  stairs (with or without device)? Independent   Functional Cognition: Did the patient need help planning regular tasks such as shopping or remembering to take medications? Needed some help   Patient Information Are you of Hispanic,  Latino/a,or Spanish origin?: A. No, not of Hispanic, Latino/a, or Spanish origin What is your race?: B. Black or African American Do you need or want an interpreter to communicate with a doctor or health care staff?: 0. No   Patient's Response To:  Health Literacy and Transportation Is the patient able to respond to health literacy and transportation needs?: Yes Health Literacy - How often do you need to have someone help you when you read instructions, pamphlets, or other written material from your doctor or pharmacy?: Always In the past 12 months, has lack of transportation kept you from medical appointments or from getting medications?: No In the past 12 months, has lack of transportation kept you from meetings, work, or from getting things needed for daily living?: No   Home Assistive Devices / [place] Devices/Equipment: Blood pressure cuff, Cane (specify quad or straight) Home Equipment: Cane - single point   Prior Device Use: Indicate devices/aids used by the patient prior to current illness, exacerbation or injury?  Cane   Current Functional Level Cognition   Overall Cognitive Status: Impaired/Different from baseline Current Attention Level: Sustained Orientation Level: Oriented X4 Following Commands: Follows one step commands with increased time, Follows one step commands inconsistently (pt with very hard time with R and L, processing cross body cues like reach across with your R hand to the L hand rain requiring max verbal and tactile cues) Safety/Judgement: Decreased awareness of deficits, Decreased awareness of safety General Comments: pt with noted processing difficulty requiring constant verbal cues and tactile cues to complete tasks. pt with poor STM ie. hand placement on slide board, seems to have motor planning issues, multimodal cues needed to complete all tasks. Eager to participate    Extremity Assessment (includes Sensation/Coordination)   Upper  Extremity Assessment: Defer to OT evaluation RUE Deficits / Details: R weaker than L. residual from prior CVA. ROM and sensation is WFL RUE Sensation: WNL RUE Coordination: decreased fine motor LUE Deficits / Details: WNL  Lower Extremity Assessment: Defer to PT evaluation RLE Deficits / Details: strength grossly 3-/5, limited ankle PF/DF ROM actively, WFL passive RLE Sensation: decreased light touch LLE Deficits / Details: strength grossly 3-/5, limited ankle PF/DF ROM actively, WFL passive LLE Sensation: WNL     ADLs   Overall ADL's : Needs assistance/impaired Eating/Feeding: Independent, Sitting Grooming: Set up, Sitting Grooming Details (indicate cue type and reason): performed seated on EOB with min guard for balance due to reliant on at least one extremity for balance Upper Body Bathing: Minimal assistance, Sitting Lower Body Bathing: Maximal assistance, Bed level, +2 for physical assistance Upper Body Dressing : Minimal assistance, Sitting Upper Body Dressing Details (indicate cue type and reason): changed gowns seated on EOB Lower Body Dressing: Maximal assistance, +2 for physical assistance, Bed level Toilet Transfer: Moderate assistance, +2 for physical assistance, +2 for safety/equipment, Requires wide/bariatric, Requires drop arm, Transfer board Toilet Transfer Details (indicate cue type and reason): simulated. bed>chair with slide board. requires simple one step directions & transfer toward R so she can push with L Toileting- Clothing Manipulation and Hygiene: Maximal assistance, Bed level Functional mobility during ADLs: Moderate assistance, +2 for physical assistance, +2 for safety/equipment General ADL Comments: good sitting balance today with cues for body positioning. Initally with  posteror lean, able to self correct. session focused on slide board transfer OOB - max cues but min-mod physical assist     Mobility   Overal bed mobility: Needs Assistance Bed Mobility:  Rolling, Sidelying to Sit Rolling: Mod assist, +2 for physical assistance Sidelying to sit: +2 for physical assistance, Mod assist, HOB elevated Supine to sit: Mod assist, HOB elevated Sit to supine: Max assist Sit to sidelying: Max assist General bed mobility comments: pt with motor planning and processing difficulties requiring max verbal and tactile cues, especially with reaching across body, modA for trunk elevation and anterior scooting to foot flat     Transfers   Overall transfer level: Needs assistance Equipment used: Sliding board Transfers: Bed to chair/wheelchair/BSC Sit to Stand: Max assist, +2 physical assistance, From elevated surface Bed to/from chair/wheelchair/BSC transfer type:: Lateral/scoot transfer  Lateral/Scoot Transfers: With slide board, +2 physical assistance, Max assist General transfer comment: pt dependent for slide board placement, pt given step by step verbal cues to lean to the L to offweight R hip as pt trying to push self across board to the chair to her R. constant verbal cues to lean forward and away from the chair. pt with solid effort but difficulty motor planning ultimately needing maxA +2 to reach chair.     Ambulation / Gait / Stairs / Wheelchair Mobility   Ambulation/Gait General Gait Details: unable at this time     Posture / Balance Dynamic Sitting Balance Sitting balance - Comments: requires close min guard, verbal cues for optimal placement of L hand on bed for support Balance Overall balance assessment: Needs assistance Sitting-balance support: Feet supported, Single extremity supported Sitting balance-Leahy Scale: Fair Sitting balance - Comments: requires close min guard, verbal cues for optimal placement of L hand on bed for support Postural control: Posterior lean Standing balance support: Bilateral upper extremity supported Standing balance-Leahy Scale: Zero Standing balance comment: maxA x 2     Special needs/care consideration  Continuous Drip IV  KVO and Diabetic management Yes, h/o DM    Previous Home Environment (from acute therapy documentation) Living Arrangements: Children Available Help at Discharge: Family, Available 24 hours/day Type of Home: House Home Layout: One level Home Access: Stairs to enter Entrance Stairs-Rails: Right Entrance Stairs-Number of Steps: 10 Bathroom Shower/Tub: Chiropodist: Standard Home Care Services: No Additional Comments: son lives with pt   Discharge Living Setting Plans for Discharge Living Setting: House, Lives with (comment) (Lives with son.  Son does not work.) Type of Home at Discharge: House Discharge Home Layout: One level Discharge Home Access: Stairs to enter Entrance Stairs-Number of Steps: 10 steps at the front entry and 2 steps at the back entrance Discharge Bathroom Shower/Tub: Tub/shower unit, Curtain Discharge Bathroom Toilet: Standard Does the patient have any problems obtaining your medications?: No   Social/Family/Support Systems Patient Roles: Parent (Has a son.) Contact Information: Brandi [name] - son Anticipated Caregiver: Son - Brandi [name] Anticipated Caregiver's Contact Information: Brandi [name] - son - [phone] Ability/Limitations of Caregiver: Son can assist. Son does not work outside of the home. Caregiver Availability: 24/7 Discharge Plan Discussed with Primary Caregiver: Yes Is Caregiver In Agreement with Plan?: Yes Does Caregiver/Family have Issues with Lodging/Transportation while Pt is in Rehab?: No   Goals Patient/Family Goal for Rehab: PT/OT supervision goals Expected length of stay: 10-12 days Pt/Family Agrees to Admission and willing to participate: Yes Program Orientation Provided & Reviewed with Pt/Caregiver Including Roles  & Responsibilities: Yes   Decrease burden of  Care through IP rehab admission: N/A   Possible need for SNF placement upon discharge: Not planned   Patient Condition: I have  reviewed medical records from [facility], spoken with CM, and patient. I met with patient at the bedside for inpatient rehabilitation assessment.  Patient will benefit from ongoing PT and OT, can actively participate in 3 hours of therapy a day 5 days of the week, and can make measurable gains during the admission.  Patient will also benefit from the coordinated team approach during an Inpatient Acute Rehabilitation admission.  The patient will receive intensive therapy as well as Rehabilitation physician, nursing, social worker, and care management interventions.  Due to bladder management, bowel management, safety, skin/wound care, disease management, medication administration, pain management, and patient education the patient requires 24 hour a day rehabilitation nursing.  The patient is currently mod to max assist with mobility and basic ADLs.  Discharge setting and therapy post discharge at home with home health is anticipated.  Patient has agreed to participate in the Acute Inpatient Rehabilitation Program and will admit today.   Preadmission Screen Completed By:  Retta Diones, 11/30/2021 12:22 PM ______________________________________________________________________   Discussed status with Dr. Naaman Plummer on 11/30/21 at 1000 and received approval for admission today.   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 Yes Co-Morbidities requiring supervision/potential complications: morbid oesity, DM, anemia, htn Due to bladder management, bowel management, safety, skin/wound care, disease management, medication administration, pain management, and patient education, does the patient require 24 hr/day rehab nursing? Yes Does the  patient require coordinated care of a physician, rehab nurse, PT, OT  to address physical and functional deficits in the context of the above medical diagnosis(es)? Yes Addressing deficits in the following areas: balance, endurance, locomotion, strength, transferring, bowel/bladder control, bathing, dressing, feeding, grooming, toileting, and psychosocial support Can the patient actively participate in an intensive therapy program of at least 3 hrs of therapy 5 days a week? Yes The potential for patient to make measurable gains while on inpatient rehab is excellent Anticipated functional outcomes upon discharge from inpatient rehab: supervision PT, supervision OT, n/a SLP Estimated rehab length of stay to reach the above functional goals is: 10-12 days Anticipated discharge destination: Home 10. Overall Rehab/Functional Prognosis: excellent     MD Signature: Brandi Staggers, MD, [place] Director Rehabilitation Services 11/30/2021          Revision History                                              Note Details  Author Brandi Staggers, MD File Time 11/30/2021 12:37 PM  Author Type Physician Status Signed  Last Editor Brandi Staggers, MD Service CASE MANAGEMENT

## 2021-11-30 NOTE — Progress Notes (Signed)
IP rehab admissions - I received a call from [place] and the initial denial has been overturned.  We do have approval for CIR.  I met with patient and spoke to her son by phone.  Both patient and son are in agreement to inpatient rehab admission.  I do have a bed on CIR today if MD feels patient is medically ready for rehab admission today.  Call for questions.  [phone]

## 2021-11-30 NOTE — Progress Notes (Signed)
PROGRESS NOTE  [name]  SWF:[id number] DOB: Dec 30, 1955 DOA: 11/20/2021 PCP: [name], MD   Brief Narrative: Brandi [name] is a 66 y.o. female with medical history significant of with history of diabetes mellitus type 2, hypertension, hyperlipidemia, stroke, presents the ED with a chief complaint of bilateral leg weakness.  Work-up MRI lumbar spine showed spondylosis and severe stenosis.  Neurology was consulted and was recommended to transfer to [facility] for further evaluation by neurosurgery service.  Started on Decadron,now being tapered.  Neurosurgery and neurology consulted.  Neurosurgery says she is not a candidate for surgery and recommending rehabilitation.  PT/OT recommending CIR, pending authorization.  Discharge planning to CIR versus skilled nursing facility.   Assessment & Plan:  * Leg weakness, bilateral- (present on admission) -CT head, MRI and MRA of brain -although limited due to motion showed no acute intracranial changes. 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  -Neurosurgery consulted and  following, no plan for surgical intervention for now. -Neurosurgery suspecting possible other neurological cause for paraplegia, so neurology consulted.  No further recommendation from neurology, low suspicion for transverse myelitis or any other neurological problems -PT/OT recommending CIR on  Discharge -Patient has mild improvement in the strength on bilateral lower extremities.  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 -Continue treatment with steroids,start tapering -Patient  transferred to [facility] for further evaluation by neurosurgery. -MRI of lumbar, thoracic and cervical  spine showed widespread degenerative spondylosis, canal and foraminal stenosis. At T3-T4,T4-T5. there is a disc bulge with osteophytic ridging with moderate to severe canal stenosis  Obesity- (present on admission) BMI of 39.5  Leucocytosis This is likely from steroids.  Continue to monitor  Acute urinary retention Foley placed here,now removed.  Started on tamsulosin.Voiding well spontaneously  History of CVA (cerebrovascular accident) -Continue risk factor modifications -Continue the use of aspirin and statins.   -No new intracranial abnormalities appreciated on head images.   -Uses cane for ambulation  Mixed hyperlipidemia- (present on admission) -Continue statin -Heart healthy diet discussed with patient.  DM type 2 (diabetes mellitus, type 2) (HCC) -Diet controlled -Hemoglobin A1c found to be 6 -Continue sliding scale insulin  Microcytic anemia- (present on admission) - Hemoglobin in the range of 8.  Iron studies showed severe iron deficiency. -Supplemented with  IV iron.  Patient also needs to be continued on oral iron therapy -No report of hematochezia or melena. 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  At that time colonoscopy and EGD were normal -She has chronic iron-deficiency anemia and her baseline hemoglobin is in the range of 8  HTN (hypertension)- (present on admission) -BP soft on current medications: Taking lisinopril, hydrochlorothiazide, metoprolol at home.  We will discontinue lisinopril -Heart healthy diet discussed with patient. -Monitor blood pressure.  Currently normotensive  Hypokalemia-resolved as of 11/24/2021, (present on admission) - Supplemented and corrected              DVT prophylaxis:heparin injection 5,000 Units Start: 11/20/21 2200 SCDs Start: 11/20/21 2137     Code Status: Full Code  Family Communication: Son at bedside on 2/14  Patient status:Inpatient  Patient  is from: Home  Anticipated discharge to:CIR vs SNF   Consultants: Neurology,  neurosurgery  Procedures: None  Antimicrobials:  Anti-infectives (From admission, onward)    None       Subjective:  Patient seen and examined at the bedside this morning.  Hemodynamically stable.  No new changes from yesterday.  No new complaints.  Waiting for decision from CIR  Objective: Vitals:   11/29/21 2307 11/30/21 0634 11/30/21 0710 11/30/21 0759  BP: (!) 119/54 114/60  102/64  Pulse: (!) 50 (!) 55  (!) 49  Resp: 17 16    Temp: 97.9 F (36.6 C) 98 F (36.7 C)  97.8 F (36.6 C)  TempSrc: Oral Oral  Oral  SpO2: 99% 99% 99% 99%  Weight:      Height:        Intake/Output Summary (Last 24 hours) at 11/30/2021 1109 Last data filed at 11/30/2021 1040 Gross per 24 hour  Intake 600 ml  Output 2800 ml  Net -2200 ml   Filed Weights   11/20/21 1243  Weight: 111 kg    Examination:  General exam: Overall comfortable, not in distress,obese HEENT: PERRL Respiratory system:  no wheezes or crackles  Cardiovascular system: S1 & S2 heard, RRR.  Gastrointestinal system: Abdomen is nondistended, soft and nontender. Central nervous system: Alert and oriented, weakness in bilateral lower extremities Strength: Left lower extremity:4/5 Right lower extremity:3/5 Extremities: No edema, no clubbing ,no cyanosis Skin: No rashes, no ulcers,no icterus     Data Reviewed: I have personally reviewed following labs and imaging studies  CBC: Recent Labs  Lab 11/24/21 0109 11/25/21 0130 11/28/21 0419 11/30/21 0300  WBC 11.2* 13.2* 11.3* 12.8*  NEUTROABS 8.1* 9.6* 8.1* 8.7*  HGB 8.9* 9.8* 10.5* 10.5*  HCT 31.2* 33.6* 37.3 36.0  MCV 66.1* 66.7* 70.0* 70.2*  PLT 494* 505* 468* 950*   Basic Metabolic Panel: Recent Labs  Lab 11/25/21 0130 11/28/21 0419 11/30/21 0300  NA 137 135 136  K 4.3 4.6 4.5  CL 104 102 103  CO2 23 23 23   GLUCOSE 136* 123* 94  BUN 25* 27* 28*  CREATININE 1.06*  1.10* 1.10*  CALCIUM 8.7* 8.6* 8.8*     Recent Results (from the past 240 hour(s))  Resp Panel by RT-PCR (Flu A&B, Covid) Nasopharyngeal Swab     Status: None   Collection Time: 11/20/21  2:13 PM   Specimen: Nasopharyngeal Swab; Nasopharyngeal(NP) swabs in vial transport medium  Result Value Ref Range Status   SARS Coronavirus 2 by RT PCR NEGATIVE NEGATIVE Final    Comment: (NOTE) SARS-CoV-2 target nucleic acids are NOT DETECTED.  The SARS-CoV-2 RNA is generally detectable in upper respiratory specimens during the acute phase of infection. The lowest concentration of SARS-CoV-2 viral copies this assay can detect is 138 copies/mL. A negative result does not preclude SARS-Cov-2 infection and should not be used as the sole basis for treatment or other patient management decisions. A negative result may occur with  improper specimen collection/handling, submission of specimen other than nasopharyngeal swab, presence of viral mutation(s) within the areas targeted by this assay, and inadequate number of viral copies(<138 copies/mL). A negative result must be combined with clinical observations, patient history, and epidemiological information. The expected result is Negative.  Fact Sheet for Patients:  EntrepreneurPulse.com.au  Fact Sheet for Healthcare Providers:  IncredibleEmployment.be  This test is no t yet approved or cleared by the [place] FDA and  has been authorized for detection and/or diagnosis of SARS-CoV-2 by FDA under an Emergency Use Authorization (EUA). This EUA will remain  in effect (  meaning this test can be used) for the duration of the COVID-19 declaration under Section 564(b)(1) of the Act, 21 U.S.C.section 360bbb-3(b)(1), unless the authorization is terminated  or revoked sooner.       Influenza A by PCR NEGATIVE NEGATIVE Final   Influenza B by PCR NEGATIVE NEGATIVE Final    Comment: (NOTE) The Xpert Xpress  SARS-CoV-2/FLU/RSV plus assay is intended as an aid in the diagnosis of influenza from Nasopharyngeal swab specimens and should not be used as a sole basis for treatment. Nasal washings and aspirates are unacceptable for Xpert Xpress SARS-CoV-2/FLU/RSV testing.  Fact Sheet for Patients: EntrepreneurPulse.com.au  Fact Sheet for Healthcare Providers: IncredibleEmployment.be  This test is not yet approved or cleared by the [place] FDA and has been authorized for detection and/or diagnosis of SARS-CoV-2 by FDA under an Emergency Use Authorization (EUA). This EUA will remain in effect (meaning this test can be used) for the duration of the COVID-19 declaration under Section 564(b)(1) of the Act, 21 U.S.C. section 360bbb-3(b)(1), unless the authorization is terminated or revoked.  Performed at [facility], [address], [place], [place] [address]      Radiology Studies: No results found.  Scheduled Meds:  aspirin  325 mg Oral Daily   Chlorhexidine Gluconate Cloth  6 each Topical Daily   dexamethasone  4 mg Oral Daily   ferrous sulfate  325 mg Oral Q breakfast   heparin  5,000 Units Subcutaneous Q8H   hydrALAZINE  25 mg Oral Q8H   insulin aspart  0-15 Units Subcutaneous TID WC   [START ON 12/01/2021] lisinopril  5 mg Oral Daily   metoprolol tartrate  25 mg Oral BID   pantoprazole  40 mg Oral Daily   polyethylene glycol  17 g Oral Daily   senna  1 tablet Oral BID   simvastatin  20 mg Oral Daily   tamsulosin  0.4 mg Oral Daily   traZODone  150 mg Oral QHS   Continuous Infusions:  sodium chloride 10 mL/hr at 11/22/21 1151     LOS: 10 days   Shelly Coss, MD [facility] P2/16/2023, 11:09 AM

## 2021-11-30 NOTE — Progress Notes (Signed)
Patient arrived to room and oriented. Verbalized agreement to call for needs. Resting comfortably with with call  bell in place

## 2021-11-30 NOTE — Progress Notes (Signed)
Physical Therapy Treatment Patient Details Name: Brandi [name] MRN: [id number] DOB: 1956-10-10 Today's Date: 11/30/2021   History of Present Illness 66 y/o female presented to ED on 11/20/21 of bilateral LE weakness x 1 week and fall out of bed. CT head and MRI negative. 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 PMH: CVA, HTN, Diabetes    PT Comments    Pt admitted with above diagnosis. Pt was able to stand to [facility] with max assist of 2 persons. Pt practiced sit to stand at least 4 more x to be cleaned. Pt has difficulty maintaining standing for over 5 seconds each attempts. Plan is to go to AIR today.  Pt currently with functional limitations due to balance and endurance deficits. Pt will benefit from skilled PT to increase their independence and safety with mobility to allow discharge to the venue listed below.      Recommendations for follow up therapy are one component of a multi-disciplinary discharge planning process, led by the attending physician.  Recommendations may be updated based on patient status, additional functional criteria and insurance authorization.  Follow Up Recommendations  Acute inpatient rehab (3hours/day)     Assistance Recommended at Discharge Frequent or constant Supervision/Assistance  Patient can return home with the following Two people to help with walking and/or transfers;Two people to help with bathing/dressing/bathroom;Assistance with cooking/housework;Direct supervision/assist for medications management;Direct supervision/assist for financial management;Assist for transportation;Help with stairs or ramp for entrance   Equipment Recommendations  Hospital bed;Wheelchair (measurements PT);Wheelchair cushion (measurements PT);Other (comment)    Recommendations for Other Services Rehab consult     Precautions / Restrictions Precautions Precautions: Fall Precaution Comments: body  habitus Restrictions Weight Bearing Restrictions: No     Mobility  Bed Mobility Overal bed mobility: Needs Assistance Bed Mobility: Rolling, Sidelying to Sit Rolling: Mod assist, +2 for physical assistance Sidelying to sit: +2 for physical assistance, Mod assist, HOB elevated Supine to sit: Mod assist, HOB elevated, +2 for physical assistance     General bed mobility comments: pt with motor planning and processing difficulties requiring max verbal and tactile cues, especially with reaching across body, modA for trunk elevation and anterior scooting to foot flat    Transfers Overall transfer level: Needs assistance   Transfers: Bed to chair/wheelchair/BSC, Sit to/from Stand Sit to Stand: Max assist, +2 physical assistance, From elevated surface           General transfer comment: Pt with purewick with notable leaking therefore planeed to clean pt when standing. Pt was able to stand to [facility] on 2nd attempt but cannot stand for longer than about 5 seconds at a time.  She does stand enough to get pads under her on second attempt. Moved the pt to the recliner.  Upon stand from [place], noted pt with BM. Pt needed 3 more attempts to stand from [place] needing mod assist to stand from the [place] surface and she was able on last attempt to stand enough for PT and OT to get her cleaned noting some BM as well that had to be cleaned.  Noted that pt collapses at upper trunk and then needs to sit.  Also right LE weaker than left LE. Transfer via Lift Equipment: [place]  Ambulation/Gait               General Gait Details: unable at this time   Chief Strategy Officer  Modified Rankin (Stroke Patients Only)       Balance Overall balance assessment: Needs assistance Sitting-balance support: Feet supported, Single extremity supported Sitting balance-Leahy Scale: Fair Sitting balance - Comments: requires close min guard, verbal cues for optimal placement of L hand  on bed for support   Standing balance support: Bilateral upper extremity supported Standing balance-Leahy Scale: Poor Standing balance comment: maxA x 2 to come to stand but cannot sustain standing upright for more than 5 seconds.                            Cognition Arousal/Alertness: Awake/alert Behavior During Therapy: WFL for tasks assessed/performed Overall Cognitive Status: Impaired/Different from baseline Area of Impairment: Attention, Following commands, Safety/judgement, Awareness, Problem solving                 Orientation Level: Disoriented to, Time Current Attention Level: Sustained   Following Commands: Follows one step commands with increased time, Follows one step commands inconsistently Safety/Judgement: Decreased awareness of deficits, Decreased awareness of safety Awareness: Emergent Problem Solving: Slow processing, Requires verbal cues, Difficulty sequencing, Requires tactile cues General Comments: pt with noted processing difficulty requiring constant verbal cues and tactile cues to complete tasks. seems to have motor planning issues, multimodal cues needed to complete all tasks. Eager to participate        Exercises General Exercises - Lower Extremity Long Arc Quad: AAROM, Both, 10 reps, Seated Heel Slides: AAROM, Both, 10 reps, Supine Hip Flexion/Marching: Both, 10 reps, Seated, AAROM    General Comments General comments (skin integrity, edema, etc.): VSS on RA      Pertinent Vitals/Pain Pain Assessment Pain Assessment: Faces Faces Pain Scale: Hurts a little bit Pain Location: bottom from "boil" per pt Pain Descriptors / Indicators: Discomfort Pain Intervention(s): Limited activity within patient's tolerance, Monitored during session, Repositioned    Home Living                          Prior Function            PT Goals (current goals can now be found in the care plan section) Acute Rehab PT Goals Patient Stated  Goal: to walk Progress towards PT goals: Progressing toward goals    Frequency    Min 4X/week      PT Plan Current plan remains appropriate    Co-evaluation PT/OT/SLP Co-Evaluation/Treatment: Yes Reason for Co-Treatment: Complexity of the patient's impairments (multi-system involvement);For patient/therapist safety PT goals addressed during session: Mobility/safety with mobility        AM-PAC PT "6 Clicks" Mobility   Outcome Measure  Help needed turning from your back to your side while in a flat bed without using bedrails?: A Lot Help needed moving from lying on your back to sitting on the side of a flat bed without using bedrails?: Total Help needed moving to and from a bed to a chair (including a wheelchair)?: Total Help needed standing up from a chair using your arms (e.g., wheelchair or bedside chair)?: Total Help needed to walk in hospital room?: Total Help needed climbing 3-5 steps with a railing? : Total 6 Click Score: 7    End of Session Equipment Utilized During Treatment: Gait belt Activity Tolerance: Patient tolerated treatment well Patient left: in chair;with call bell/phone within reach;with chair alarm set Nurse Communication: Mobility status;Need for lift equipment;Other (comment) (lift pad) PT Visit Diagnosis: Other abnormalities of gait and  mobility (R26.89);Muscle weakness (generalized) (M62.81);Other symptoms and signs involving the nervous system (R29.898)     Time: 4037-0964 PT Time Calculation (min) (ACUTE ONLY): 26 min  Charges:  $Therapeutic Activity: 8-22 mins                     Taji Sather M,PT Acute Rehab Services [phone] (pager)    Alvira Philips 11/30/2021, 1:40 PM

## 2021-11-30 NOTE — Progress Notes (Signed)
Occupational Therapy Treatment Patient Details Name: Brandi [name] MRN: [id number] DOB: [birth year] Today's Date: 11/30/2021   History of present illness 66 y/o female presented to ED on 11/20/21 of bilateral LE weakness x 1 week and fall out of bed. CT head and MRI negative. 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 PMH: CVA, HTN, Diabetes (Simultaneous filing. User may not have seen previous data.)   OT comments  Patient received in bed and agreeable to PT/OT session.  Patient was discovered to have wet bed once on EOB and stood with max assist +2 from EOB into stedy and stood 4 more from stedy for cleaning due to BM and patient having difficulty standing for more than 5 seconds. Patient was lowered to recliner following cleaning. Patient would benefit from further OT services and is expected to go to [facility] today.    Recommendations for follow up therapy are one component of a multi-disciplinary discharge planning process, led by the attending physician.  Recommendations may be updated based on patient status, additional functional criteria and insurance authorization.    Follow Up Recommendations  Acute inpatient rehab (3hours/day)    Assistance Recommended at Discharge Frequent or constant Supervision/Assistance  Patient can return home with the following  A lot of help with walking and/or transfers;A lot of help with bathing/dressing/bathroom;Assistance with cooking/housework;Assist for transportation;Help with stairs or ramp for entrance   Equipment Recommendations  Wheelchair (measurements OT);Wheelchair cushion (measurements OT)    Recommendations for Other Services      Precautions / Restrictions Precautions Precautions: Fall (Simultaneous filing. User may not have seen previous data.) Precaution Comments: body habitus (Simultaneous filing. User may not have seen previous data.) Restrictions Weight Bearing Restrictions: No  (Simultaneous filing. User may not have seen previous data.)       Mobility Bed Mobility Overal bed mobility: Needs Assistance (Simultaneous filing. User may not have seen previous data.) Bed Mobility: Rolling, Sidelying to Sit (Simultaneous filing. User may not have seen previous data.) Rolling: Mod assist, +2 for physical assistance (Simultaneous filing. User may not have seen previous data.) Sidelying to sit: +2 for physical assistance, Mod assist, HOB elevated (Simultaneous filing. User may not have seen previous data.)       General bed mobility comments: patient required fequent cues for technque and mod assist +2 due to assistance with trunk (Simultaneous filing. User may not have seen previous data.)    Transfers Overall transfer level: Needs assistance (Simultaneous filing. User may not have seen previous data.) Equipment used: Ambulation equipment used Transfers: Bed to chair/wheelchair/BSC, Sit to/from Stand (Simultaneous filing. User may not have seen previous data.) Sit to Stand: Max assist, Mod assist, +2 physical assistance (Simultaneous filing. User may not have seen previous data.)           General transfer comment: Patient was max assist +2 to stand from EOB into stedy and mod assist +2 to stand from stedy pads. Patient required assistance for cleaning due to wetting her bed and cleaning for BM. Patient required 3 more attempts to stand in stedy for cleaning. (Simultaneous filing. User may not have seen previous data.)     Balance Overall balance assessment: Needs assistance (Simultaneous filing. User may not have seen previous data.) Sitting-balance support: Feet supported, Single extremity supported (Simultaneous filing. User may not have seen previous data.) Sitting balance-Leahy Scale: Fair (Simultaneous filing. User may not have seen previous data.) Sitting balance - Comments: min guard to min assist for  sitting balance on EOB (Simultaneous filing. User may not  have seen previous data.) Postural control: Posterior lean Standing balance support: Bilateral upper extremity supported (Simultaneous filing. User may not have seen previous data.) Standing balance-Leahy Scale: Zero (Simultaneous filing. User may not have seen previous data.) Standing balance comment: maxA x 2 (Simultaneous filing. User may not have seen previous data.)                           ADL either performed or assessed with clinical judgement   ADL Overall ADL's : Needs assistance/impaired             Lower Body Bathing: Maximal assistance;Bed level;+2 for physical assistance Lower Body Bathing Details (indicate cue type and reason): patient stood in stedy for bathing bottom                       General ADL Comments: Patient had wet her bed and stood in [place] to clean with max assist +2    Extremity/Trunk Assessment Upper Extremity Assessment RUE Deficits / Details: R weaker than L. residual from prior CVA. ROM and sensation is WFL RUE Sensation: WNL RUE Coordination: decreased fine motor LUE Deficits / Details: WNL            Vision       Perception     Praxis      Cognition Arousal/Alertness: Awake/alert (Simultaneous filing. User may not have seen previous data.) Behavior During Therapy: [facility] for tasks assessed/performed (Simultaneous filing. User may not have seen previous data.) Overall Cognitive Status: Impaired/Different from baseline (Simultaneous filing. User may not have seen previous data.) Area of Impairment: Attention, Following commands, Safety/judgement, Awareness, Problem solving (Simultaneous filing. User may not have seen previous data.)                 Orientation Level: Disoriented to, Time (Simultaneous filing. User may not have seen previous data.) Current Attention Level: Sustained (Simultaneous filing. User may not have seen previous data.)   Following Commands: Follows one step commands consistently,  Follows one step commands with increased time (Simultaneous filing. User may not have seen previous data.) Safety/Judgement: Decreased awareness of deficits, Decreased awareness of safety (Simultaneous filing. User may not have seen previous data.) Awareness: Emergent (Simultaneous filing. User may not have seen previous data.) Problem Solving: Slow processing, Requires verbal cues, Difficulty sequencing, Requires tactile cues (Simultaneous filing. User may not have seen previous data.) General Comments: often required cues repeated for bed mobility and standing in Stedy (Simultaneous filing. User may not have seen previous data.)        Exercises      Shoulder Instructions       General Comments VSS on RA    Pertinent Vitals/ Pain       Pain Assessment Pain Assessment: Faces (Simultaneous filing. User may not have seen previous data.) Faces Pain Scale: No hurt (Simultaneous filing. User may not have seen previous data.) Pain Intervention(s): Monitored during session (Simultaneous filing. User may not have seen previous data.)  Home Living                                          Prior Functioning/Environment              Frequency  Min 2X/week  Progress Toward Goals  OT Goals(current goals can now be found in the care plan section)  Progress towards OT goals: Progressing toward goals  Acute Rehab OT Goals Patient Stated Goal: get better OT Goal Formulation: With patient Time For Goal Achievement: 12/06/21 Potential to Achieve Goals: Good ADL Goals Pt Will Perform Lower Body Bathing: with supervision;sit to/from stand Pt Will Perform Lower Body Dressing: with supervision;sit to/from stand Pt Will Transfer to Toilet: with supervision;ambulating Additional ADL Goal #1: Pt will independently complete bed mobility as a precursor to ADLs  Plan Discharge plan remains appropriate    Co-evaluation    PT/OT/SLP Co-Evaluation/Treatment:  Yes Reason for Co-Treatment: For patient/therapist safety;To address functional/ADL transfers (Simultaneous filing. User may not have seen previous data.) PT goals addressed during session: Mobility/safety with mobility OT goals addressed during session: ADL's and self-care      AM-PAC OT "6 Clicks" Daily Activity     Outcome Measure   Help from another person eating meals?: None Help from another person taking care of personal grooming?: A Little Help from another person toileting, which includes using toliet, bedpan, or urinal?: A Lot Help from another person bathing (including washing, rinsing, drying)?: A Lot Help from another person to put on and taking off regular upper body clothing?: A Little Help from another person to put on and taking off regular lower body clothing?: A Lot 6 Click Score: 16    End of Session Equipment Utilized During Treatment: Gait belt;Other (comment) Charlaine Dalton)  OT Visit Diagnosis: Other abnormalities of gait and mobility (R26.89);Unsteadiness on feet (R26.81);Muscle weakness (generalized) (M62.81);History of falling (Z91.81);Hemiplegia and hemiparesis Hemiplegia - caused by: Unspecified   Activity Tolerance Patient tolerated treatment well   Patient Left in chair;with call bell/phone within reach;with chair alarm set   Nurse Communication Mobility status;Need for lift equipment        Time: 8502-7741 OT Time Calculation (min): 26 min  Charges: OT General Charges $OT Visit: 1 Visit OT Treatments $Self Care/Home Management : 8-22 mins  Lodema Hong, [place]  Pager [phone] Office [place] 11/30/2021, 1:47 PM

## 2021-11-30 NOTE — H&P (Signed)
Physical Medicine and Rehabilitation Admission H&P    Chief Complaint  Patient presents with   Fall   Extremity Weakness    Pt states she has bad knees.  : HPI: Brandi [name] is a 66 year old right-handed female with history of parasitic anemia, diabetes mellitus hypertension, history of CVA maintained on aspirin, hyperlipidemia, quit smoking 10 years ago.  Per chart review patient lives with children.  1 level home 10 steps to entry.  Independent with straight point cane.  Presented 11/20/2021 with lower extremity weakness progressive paraplegia to [facility] as well as tingling in left foot x2 weeks as well as gait instability.Marland Kitchen  CT/MRI of the brain chronic left temporoparietal encephalomalacia no evidence of acute abnormality.  MRA of the head negative. 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  MRI thoracic spine showed T3-4, T4-5 disc bulge with osteophytic ridging with moderate to severe canal stenosis.  Neurosurgical /neurology follow-up no surgical intervention placed on steroid taper.  Scans reviewed by neurosurgery for etiology of her weakness and did not believe it was from her cervical disease she had no upper extremity involvement.  Suspect myelopathy with upper thoracic sensory level   It was felt that transverse myelitis relatively unlikely.  Awaiting plan for possible myelogram.  Patient was cleared to begin subcutaneous heparin for DVT prophylaxis.  Bouts of acute urinary retention Foley tube removed currently maintained on Flomax.  Chronic anemia hemoglobin 9.5 and monitored.  Therapy evaluations completed due to patient decreased functional mobility was admitted for a comprehensive rehab program.  Review of Systems  Constitutional:  Negative for chills, fever and malaise/fatigue.  HENT:  Negative for hearing loss.   Eyes:  Negative for blurred vision and double vision.  Respiratory:  Negative for cough and  shortness of breath.   Cardiovascular:  Negative for chest pain, palpitations and leg swelling.  Gastrointestinal:  Positive for constipation. Negative for heartburn, nausea and vomiting.  Genitourinary:  Positive for urgency. Negative for dysuria, flank pain and hematuria.  Musculoskeletal:  Positive for myalgias.  Skin:  Negative for rash.  Neurological:  Positive for sensory change and weakness.  Psychiatric/Behavioral:  The patient has insomnia.        Anxiety  All other systems reviewed and are negative. Past Medical History:  Diagnosis Date   Anemia    Diabetes mellitus without complication ([place])    Hypertension    Stroke ([place]) 2013   Past Surgical History:  Procedure Laterality Date   BOWEL RESECTION N/A 02/01/2017   Procedure: PARTIAL SMALL BOWEL RESECTION;  Surgeon: Aviva Signs, MD;  Location: [facility];  Service: General;  Laterality: N/A;   COLONOSCOPY WITH PROPOFOL N/A 01/26/2017   Procedure: COLONOSCOPY WITH PROPOFOL;  Surgeon: Daneil Dolin, MD;  Location: [facility];  Service: Endoscopy;  Laterality: N/A;   cyst removed from right breast     ESOPHAGOGASTRODUODENOSCOPY (EGD) WITH PROPOFOL N/A 01/26/2017   Procedure: ESOPHAGOGASTRODUODENOSCOPY (EGD) WITH PROPOFOL;  Surgeon: Daneil Dolin, MD;  Location: [facility];  Service: Endoscopy;  Laterality: N/A;   GIVENS CAPSULE STUDY N/A 01/27/2017   Procedure: GIVENS CAPSULE STUDY;  Surgeon: Daneil Dolin, MD;  Location: [facility];  Service: Endoscopy;  Laterality: N/A;   KNEE SURGERY  right knee   LAPAROTOMY N/A 02/01/2017   Procedure: EXPLORATORY LAPAROTOMY;  Surgeon: Aviva Signs, MD;  Location: [facility];  Service: General;  Laterality: N/A;   POLYPECTOMY  01/26/2017  Procedure: POLYPECTOMY;  Surgeon: Daneil Dolin, MD;  Location: [facility];  Service: Endoscopy;;  sigmoid and rectal   Family History  Problem Relation Age of Onset   Heart attack Father    Ulcers Mother        stomach   Social History:  reports  that she quit smoking about 10 years ago. Her smoking use included cigarettes. She has a 25.00 pack-year smoking history. She has never used smokeless tobacco. She reports that she does not drink alcohol and does not use drugs. Allergies:  Allergies  Allergen Reactions   Codeine     Unknown    Medications Prior to Admission  Medication Sig Dispense Refill   albuterol (VENTOLIN HFA) 108 (90 Base) MCG/ACT inhaler Inhale into the lungs.     ALPRAZolam (XANAX) 0.25 MG tablet Take 0.25 mg by mouth 2 (two) times daily as needed.     aspirin 325 MG tablet Take 325 mg by mouth daily.     hydrALAZINE (APRESOLINE) 25 MG tablet Take 25 mg by mouth daily.     lisinopril (ZESTRIL) 20 MG tablet Take 20 mg by mouth daily.     metoprolol (LOPRESSOR) 100 MG tablet Take 0.5 tablets (50 mg total) by mouth 2 (two) times daily. 120 tablet 0   Oxycodone HCl 10 MG TABS Take 5 mg by mouth 4 (four) times daily.  0   simvastatin (ZOCOR) 20 MG tablet Take 20 mg by mouth daily.     traZODone (DESYREL) 150 MG tablet Take 150 mg by mouth at bedtime.     HYDROcodone-acetaminophen (NORCO/VICODIN) 5-325 MG tablet Take 1 tablet by mouth every 6 (six) hours as needed for moderate pain. (Patient not taking: Reported on 02/22/2017) 30 tablet 0   traMADol (ULTRAM) 50 MG tablet Take 1 tablet (50 mg total) by mouth every 6 (six) hours as needed. (Patient not taking: Reported on 11/20/2021) 10 tablet 0      Home: Home Living Family/patient expects to be discharged to:: Private residence Living Arrangements: Children Available Help at Discharge: Family, Available 24 hours/day Type of Home: House Home Access: Stairs to enter [employer] of Steps: 10 Entrance Stairs-Rails: Right Home Layout: One level Bathroom Shower/Tub: Chiropodist: Standard Home Equipment: Cane - single point Additional Comments: son lives with pt   Functional History: Prior Function Prior Level of Function :  Independent/Modified Independent Mobility Comments: mobility with SPC ADLs Comments: mod I, bathes sitting on the side of the tub, cooks/cleans  Functional Status:  Mobility: Bed Mobility Overal bed mobility: Needs Assistance Bed Mobility: Rolling, Sidelying to Sit Rolling: Mod assist, +2 for physical assistance Sidelying to sit: +2 for physical assistance, Mod assist, HOB elevated Supine to sit: Mod assist, HOB elevated Sit to supine: Max assist Sit to sidelying: Max assist General bed mobility comments: pt with motor planning and processing difficulties requiring max verbal and tactile cues, especially with reaching across body, modA for trunk elevation and anterior scooting to foot flat Transfers Overall transfer level: Needs assistance Equipment used: Sliding board Transfers: Bed to chair/wheelchair/BSC Sit to Stand: Max assist, +2 physical assistance, From elevated surface Bed to/from chair/wheelchair/BSC transfer type:: Lateral/scoot transfer  Lateral/Scoot Transfers: With slide board, +2 physical assistance, Max assist General transfer comment: pt dependent for slide board placement, pt given step by step verbal cues to lean to the L to offweight R hip as pt trying to push self across board to the chair to her R. constant verbal cues  to lean forward and away from the chair. pt with solid effort but difficulty motor planning ultimately needing maxA +2 to reach chair. Ambulation/Gait General Gait Details: unable at this time    ADL: ADL Overall ADL's : Needs assistance/impaired Eating/Feeding: Independent, Sitting Grooming: Set up, Sitting Grooming Details (indicate cue type and reason): performed seated on EOB with min guard for balance due to reliant on at least one extremity for balance Upper Body Bathing: Minimal assistance, Sitting Lower Body Bathing: Maximal assistance, Bed level, +2 for physical assistance Upper Body Dressing : Minimal assistance, Sitting Upper Body  Dressing Details (indicate cue type and reason): changed gowns seated on EOB Lower Body Dressing: Maximal assistance, +2 for physical assistance, Bed level Toilet Transfer: Moderate assistance, +2 for physical assistance, +2 for safety/equipment, Requires wide/bariatric, Requires drop arm, Transfer board Toilet Transfer Details (indicate cue type and reason): simulated. bed>chair with slide board. requires simple one step directions & transfer toward R so she can push with L Toileting- Clothing Manipulation and Hygiene: Maximal assistance, Bed level Functional mobility during ADLs: Moderate assistance, +2 for physical assistance, +2 for safety/equipment General ADL Comments: good sitting balance today with cues for body positioning. Initally with posteror lean, able to self correct. session focused on slide board transfer OOB - max cues but min-mod physical assist  Cognition: Cognition Overall Cognitive Status: Impaired/Different from baseline Orientation Level: Oriented X4 Cognition Arousal/Alertness: Awake/alert Behavior During Therapy: WFL for tasks assessed/performed Overall Cognitive Status: Impaired/Different from baseline Area of Impairment: Attention, Following commands, Safety/judgement, Awareness, Problem solving Orientation Level: Disoriented to, Time Current Attention Level: Sustained Following Commands: Follows one step commands with increased time, Follows one step commands inconsistently (pt with very hard time with R and L, processing cross body cues like reach across with your R hand to the L hand rain requiring max verbal and tactile cues) Safety/Judgement: Decreased awareness of deficits, Decreased awareness of safety Awareness: Emergent Problem Solving: Slow processing, Requires verbal cues, Difficulty sequencing, Requires tactile cues General Comments: pt with noted processing difficulty requiring constant verbal cues and tactile cues to complete tasks. pt with poor STM  ie. hand placement on slide board, seems to have motor planning issues, multimodal cues needed to complete all tasks. Eager to participate  Physical Exam: Blood pressure 101/61, pulse (!) 48, temperature 97.7 F (36.5 C), temperature source Oral, resp. rate 18, height 5\' 6"  (1.676 m), weight 111 kg, SpO2 97 %. Physical Exam Neurological:     Comments: Patient is alert.  Oriented x3 and follows commands.    Results for orders placed or performed during the hospital encounter of 11/20/21 (from the past 48 hour(s))  Glucose, capillary     Status: Abnormal   Collection Time: 11/28/21  3:33 PM  Result Value Ref Range   Glucose-Capillary 163 (H) 70 - 99 mg/dL    Comment: Glucose reference range applies only to samples taken after fasting for at least 8 hours.  Glucose, capillary     Status: Abnormal   Collection Time: 11/28/21 11:01 PM  Result Value Ref Range   Glucose-Capillary 126 (H) 70 - 99 mg/dL    Comment: Glucose reference range applies only to samples taken after fasting for at least 8 hours.  Glucose, capillary     Status: None   Collection Time: 11/29/21  5:53 AM  Result Value Ref Range   Glucose-Capillary 95 70 - 99 mg/dL    Comment: Glucose reference range applies only to samples taken after fasting for at least  8 hours.  Glucose, capillary     Status: Abnormal   Collection Time: 11/29/21 11:29 AM  Result Value Ref Range   Glucose-Capillary 130 (H) 70 - 99 mg/dL    Comment: Glucose reference range applies only to samples taken after fasting for at least 8 hours.  Glucose, capillary     Status: Abnormal   Collection Time: 11/29/21  5:22 PM  Result Value Ref Range   Glucose-Capillary 118 (H) 70 - 99 mg/dL    Comment: Glucose reference range applies only to samples taken after fasting for at least 8 hours.  Glucose, capillary     Status: Abnormal   Collection Time: 11/29/21  9:31 PM  Result Value Ref Range   Glucose-Capillary 112 (H) 70 - 99 mg/dL    Comment: Glucose  reference range applies only to samples taken after fasting for at least 8 hours.  CBC with Differential/Platelet     Status: Abnormal   Collection Time: 11/30/21  3:00 AM  Result Value Ref Range   WBC 12.8 (H) 4.0 - 10.5 K/uL   RBC 5.13 (H) 3.87 - 5.11 MIL/uL   Hemoglobin 10.5 (L) 12.0 - 15.0 g/dL   HCT 36.0 36.0 - 46.0 %   MCV 70.2 (L) 80.0 - 100.0 fL   MCH 20.5 (L) 26.0 - 34.0 pg   MCHC 29.2 (L) 30.0 - 36.0 g/dL   RDW 26.6 (H) 11.5 - 15.5 %   Platelets 431 (H) 150 - 400 K/uL   nRBC 0.0 0.0 - 0.2 %   Neutrophils Relative % 67 %   Neutro Abs 8.7 (H) 1.7 - 7.7 K/uL   Lymphocytes Relative 22 %   Lymphs Abs 2.8 0.7 - 4.0 K/uL   Monocytes Relative 8 %   Monocytes Absolute 1.0 0.1 - 1.0 K/uL   Eosinophils Relative 1 %   Eosinophils Absolute 0.1 0.0 - 0.5 K/uL   Basophils Relative 0 %   Basophils Absolute 0.0 0.0 - 0.1 K/uL   Immature Granulocytes 2 %   Abs Immature Granulocytes 0.27 (H) 0.00 - 0.07 K/uL    Comment: Performed at [facility], [address] [place], [place] [address]  Basic metabolic panel     Status: Abnormal   Collection Time: 11/30/21  3:00 AM  Result Value Ref Range   Sodium 136 135 - 145 mmol/L   Potassium 4.5 3.5 - 5.1 mmol/L   Chloride 103 98 - 111 mmol/L   CO2 23 22 - 32 mmol/L   Glucose, Bld 94 70 - 99 mg/dL    Comment: Glucose reference range applies only to samples taken after fasting for at least 8 hours.   BUN 28 (H) 8 - 23 mg/dL   Creatinine, Ser 1.10 (H) 0.44 - 1.00 mg/dL   Calcium 8.8 (L) 8.9 - 10.3 mg/dL   GFR, Estimated 55 (L) >60 mL/min    Comment: (NOTE) Calculated using the CKD-EPI Creatinine Equation (2021)    Anion gap 10 5 - 15    Comment: Performed at [facility] [address] [place], [place] [address]  Glucose, capillary     Status: None   Collection Time: 11/30/21  6:31 AM  Result Value Ref Range   Glucose-Capillary 90 70 - 99 mg/dL    Comment: Glucose reference range applies only to samples taken after fasting  for at least 8 hours.   No results found.    Blood pressure 101/61, pulse (!) 48, temperature 97.7 F (36.5 C),  temperature source Oral, resp. rate 18, height 5\' 6"  (1.676 m), weight 111 kg, SpO2 97 %.  Medical Problem List and Plan: 1. 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  Decadron taper  -patient may shower  -ELOS/Goals: 10-12 days, supervision goals with PT and OT 2.  Antithrombotics: -DVT/anticoagulation:  Pharmaceutical: Heparin.  Check vascular study  -antiplatelet therapy: Aspirin 325 mg daily 3. Pain Management: Oxycodone as needed 4. Mood: Trazodone 150 mg nightly, Xanax 0.25 mg twice daily as needed  -antipsychotic agents: N/A 5. Neuropsych: This patient is capable of making decisions on his own behalf. 6. Skin/Wound Care: Routine skin checks 7. Fluids/Electrolytes/Nutrition: Routine in and outs with follow-up chemistries 8.  Diabetes mellitus.  Hemoglobin A1c 6.0.  SS  9.  Hypertension.  Lopressor 25 mg twice daily, hydralazine 25 mg every 8 hours.  Monitor with increased mobility 10.  Hyperlipidemia.  Zocor 11.  Microcytic anemia.  Continue iron supplement.  Follow-up CBC 12.  Acute urinary retention.  Foley tube removed.  Presently on Flomax 0.4 mg.  Check PVR 13.  Constipation.  Senokot twice daily, MiraLAX daily 14.  Obesity.  BMI 39.5.  Dietary follow-up 15.  History of CVA.  Modified independent with straight point cane prior to admission.  Continue aspirin 16.  Leukocytosis.  Suspect steroid-induced  Cathlyn Parsons, PA-C 11/30/2021

## 2021-11-30 NOTE — Discharge Summary (Signed)
Physician Discharge Summary  Brandi [name] GQQ:[id number] DOB: Mar 22, 1956 DOA: 11/20/2021  PCP: [name], MD  Admit date: 11/20/2021 Discharge date: 11/30/2021  Admitted From: Home Disposition:  Home  Discharge Condition:Stable CODE STATUS:FULL Diet recommendation: Heart Healthy  Brief/Interim Summary:  Brandi [name] is a 66 y.o. female with medical history significant of with history of diabetes mellitus type 2, hypertension, hyperlipidemia, stroke, presents the ED with a chief complaint of bilateral leg weakness.  Work-up MRI lumbar spine showed spondylosis and severe stenosis.  Neurology was consulted and was recommended to transfer to [facility] for further evaluation by neurosurgery service.  Started on Decadron,now being tapered.  Neurosurgery and neurology consulted.  Neurosurgery says she is not a candidate for surgery and recommending rehabilitation.  PT/OT recommending CIR, medically stable for discharge today.  Following problems were addressed during her hospitalization:   Leg weakness, bilateral- (present on admission) -CT head, MRI and MRA of brain -although limited due to motion showed no acute intracranial changes. 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  -Neurosurgery consulted and  following, no plan for surgical intervention for now. -Neurosurgery suspecting possible other neurological cause for paraplegia, so neurology consulted.  No further recommendation from neurology, low suspicion for transverse myelitis or any other neurological problems -PT/OT recommending CIR on  Discharge -Patient has mild improvement in the strength on bilateral lower extremities.  Weakness is more on the right lower extremity   Degenerative joint disease of spine- (present on admission) - Being the main problem for patient's bilateral  lower extremity weakness. -Continue treatment with steroids,start tapering -Patient  transferred to [facility] for further evaluation by neurosurgery. -MRI of lumbar, thoracic and cervical spine showed widespread degenerative spondylosis, canal and foraminal stenosis. At T3-T4,T4-T5. there is a disc bulge with osteophytic ridging with moderate to severe canal stenosis   Obesity- (present on admission) BMI of 39.5   Leucocytosis This is likely from steroids. Stable   Acute urinary retention Foley placed here,now removed.  Started on tamsulosin.Voiding well spontaneously   History of CVA (cerebrovascular accident) -Continue risk factor modifications -Continue the use of aspirin and statin.   -No new intracranial abnormalities appreciated on head images.   -Uses cane for ambulation   Mixed hyperlipidemia- (present on admission) -Continue statin -Heart healthy diet discussed with patient.   DM type 2 (diabetes mellitus, type 2) (HCC) -Diet controlled -Hemoglobin A1c found to be 6   Microcytic anemia- (present on admission) - Hemoglobin in the range of 8.  Iron studies showed severe iron deficiency. -Supplemented with  IV iron.  Patient also needs to be continued on oral iron therapy -No report of hematochezia or melena. 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  At that time colonoscopy and EGD were normal -She has chronic iron-deficiency anemia and her baseline hemoglobin is in the range of 8   HTN (hypertension)- (present on admission) -BP soft on current medications: Taking lisinopril, hydrochlorothiazide, metoprolol at home.  We will discontinue lisinopril,decrease the dose of metoprolol/ -Heart healthy diet discussed with patient. -Monitor blood pressure.  Currently normotensive     Discharge Diagnoses:  Principal Problem:   Leg weakness, bilateral Active Problems:   Degenerative joint disease of spine    Obesity   HTN (hypertension)   Microcytic anemia   DM type 2 (diabetes mellitus, type 2) ([place])  Mixed hyperlipidemia   History of CVA (cerebrovascular accident)   Acute urinary retention   Leucocytosis    Discharge Instructions  Discharge Instructions     Diet - low sodium heart healthy   Complete by: As directed    Discharge instructions   Complete by: As directed    1)Please take your medications as instructed 2)Follow up with neurosurgery as an outpatient.   Increase activity slowly   Complete by: As directed       Allergies as of 11/30/2021       Reactions   Codeine    Unknown         Medication List     STOP taking these medications    HYDROcodone-acetaminophen 5-325 MG tablet Commonly known as: NORCO/VICODIN   lisinopril 20 MG tablet Commonly known as: ZESTRIL   traMADol 50 MG tablet Commonly known as: ULTRAM       TAKE these medications    albuterol 108 (90 Base) MCG/ACT inhaler Commonly known as: VENTOLIN HFA Inhale into the lungs.   ALPRAZolam 0.25 MG tablet Commonly known as: XANAX Take 0.25 mg by mouth 2 (two) times daily as needed.   aspirin 325 MG tablet Take 325 mg by mouth daily.   dexamethasone 4 MG tablet Commonly known as: DECADRON Take 1 tablet (4 mg total) by mouth daily for 1 day. Start taking on: December 01, 2021   hydrALAZINE 25 MG tablet Commonly known as: APRESOLINE Take 25 mg by mouth daily.   metoprolol tartrate 25 MG tablet Commonly known as: LOPRESSOR Take 0.5 tablets (12.5 mg total) by mouth 2 (two) times daily. What changed:  medication strength how much to take   Oxycodone HCl 10 MG Tabs Take 5 mg by mouth 4 (four) times daily.   pantoprazole 40 MG tablet Commonly known as: PROTONIX Take 1 tablet (40 mg total) by mouth daily. Start taking on: December 01, 2021   polyethylene glycol 17 g packet Commonly known as: MIRALAX / GLYCOLAX Take 17 g by mouth daily. Start taking on: December 01, 2021    senna 8.6 MG Tabs tablet Commonly known as: SENOKOT Take 1 tablet (8.6 mg total) by mouth 2 (two) times daily.   simvastatin 20 MG tablet Commonly known as: ZOCOR Take 20 mg by mouth daily.   traZODone 150 MG tablet Commonly known as: DESYREL Take 150 mg by mouth at bedtime.        Follow-up Information     [facility]. Schedule an appointment as soon as possible for a visit in 4 week(s).   Specialty: Neurosurgery Contact information: [address] [place] [address] [phone]                Allergies  Allergen Reactions   Codeine     Unknown     Consultations: Neurosurgery, neurology   Procedures/Studies: CT Head Wo Contrast  Result Date: 11/20/2021 CLINICAL DATA:  Mental status change, unknown cause EXAM: CT HEAD WITHOUT CONTRAST TECHNIQUE: Contiguous axial images were obtained from the base of the skull through the vertex without intravenous contrast. RADIATION DOSE REDUCTION: This exam was performed according to the departmental dose-optimization program which includes automated exposure control, adjustment of the mA and/or kV according to patient size and/or use of iterative reconstruction technique. COMPARISON:  None. FINDINGS: Brain: There is no acute intracranial hemorrhage, mass effect, or edema. Chronic infarct of the left parietotemporal lobes with associated volume loss. No hydrocephalus or extra-axial collection.  Vascular: There is atherosclerotic calcification at the skull base. Skull: Calvarium is unremarkable. Sinuses/Orbits: No acute finding. Other: None. IMPRESSION: No acute intracranial abnormality. Chronic left parietotemporal infarct. Electronically Signed   By: Macy Mis M.D.   On: 11/20/2021 14:47   MR ANGIO HEAD WO CONTRAST  Result Date: 11/20/2021 CLINICAL DATA:  Mental status change, unknown cause EXAM: MRI HEAD WITHOUT CONTRAST TECHNIQUE: Multiplanar, multiecho pulse sequences of the  brain and surrounding structures were obtained without intravenous contrast. COMPARISON:  Same day CT head.  MRI/MRA 06/24/2012. FINDINGS: Incomplete and limited exam due to patient intolerance/back pain. The following sequences were obtained: DWI/ADC, sagittal T1 axial T2 and limited gradient echo. The MRA was attempted; however, nondiagnostic. Within this limitation: MRI: Brain: Left temporoparietal encephalomalacia, new since 2013. 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 No evidence of acute infarct, acute hemorrhage mass lesion, midline shift, or hydrocephalus. Atrophy and ex vacuo ventricular dilation. Vascular: Major arterial flow voids are maintained at the skull base. Skull and upper cervical spine: Normal marrow signal. Degenerative changes in the visualized upper cervical spine. Sinuses/Orbits: Clear sinuses.  Unremarkable orbits. Other: No mastoid effusions. MRA: Nondiagnostic due to artifact. IMPRESSION: MRI: 1. Incomplete and limited study, as detailed above. Within this limitation, no evidence of acute abnormality. 2. Chronic left temporoparietal encephalomalacia. MRA: Nondiagnostic study.  Consider CTA to further evaluate. Electronically Signed   By: Margaretha Sheffield M.D.   On: 11/20/2021 18:07   MR BRAIN WO CONTRAST  Result Date: 11/20/2021 CLINICAL DATA:  Mental status change, unknown cause EXAM: MRI HEAD WITHOUT CONTRAST TECHNIQUE: Multiplanar, multiecho pulse sequences of the brain and surrounding structures were obtained without intravenous contrast. COMPARISON:  Same day CT head.  MRI/MRA 06/24/2012. FINDINGS: Incomplete and limited exam due to patient intolerance/back pain. The following sequences were obtained: DWI/ADC, sagittal T1 axial T2 and limited gradient echo. The MRA was attempted; however, nondiagnostic. Within this limitation: MRI: Brain: Left temporoparietal encephalomalacia, new since 2013. Mild curvilinear T1 hyperintensity in this  region, likely cortical laminar necrosis. No evidence of acute infarct, acute hemorrhage mass lesion, midline shift, or hydrocephalus. Atrophy and ex vacuo ventricular dilation. Vascular: Major arterial flow voids are maintained at the skull base. Skull and upper cervical spine: Normal marrow signal. Degenerative changes in the visualized upper cervical spine. Sinuses/Orbits: Clear sinuses.  Unremarkable orbits. Other: No mastoid effusions. MRA: Nondiagnostic due to artifact. IMPRESSION: MRI: 1. Incomplete and limited study, as detailed above. Within this limitation, no evidence of acute abnormality. 2. Chronic left temporoparietal encephalomalacia. MRA: Nondiagnostic study.  Consider CTA to further evaluate. Electronically Signed   By: Margaretha Sheffield M.D.   On: 11/20/2021 18:07   MR THORACIC SPINE WO CONTRAST  Result Date: 11/24/2021 CLINICAL DATA:  Provided history: Myelopathy, acute, thoracic spine. EXAM: MRI THORACIC SPINE WITHOUT CONTRAST TECHNIQUE: Multiplanar, multisequence MR imaging of the thoracic spine was performed. No intravenous contrast was administered. COMPARISON:  Prior thoracic spine MRI examinations 11/23/2021 and 11/21/2021. FINDINGS: A problem-oriented repeat thoracic spine MRI was performed to better assess sites of stenosis within the upper thoracic spinal canal. The current examination spans the C6-T10 levels on sagittal imaging. The T2-T3, T3-T4, T4-T5 and T5-T6 levels are included on axial imaging. In addition to routine sequences, multiple high-resolution 3D T2-weighted sequences were also performed. Alignment:  No significant spondylolisthesis. Vertebrae: No thoracic vertebral compression fracture. Multilevel degenerative endplate irregularity, greatest at T3-T4 and T4-T5. Apparent solid bridging anterior osteophytes at the T5-T6, T6-T7, T7-T8 and T9-T10 levels.  2.2 cm hemangioma within the right aspect of the T3 vertebral body. Cord: No definite spinal cord signal abnormality is  appreciated. Spinal cord flattening at T3-T4 and T4-T5, as described below. Paraspinal and other soft tissues: No abnormality identified within included portions of the thorax. Paraspinal soft tissues unremarkable. Disc levels: Multilevel disc space narrowing. Most notably, disc space narrowing is advanced at T2-T3, T3-T4, T5-T6, T6-T7, T7-T8, T8-T9 and T9-T10. Also of note, disc degeneration is moderate/advanced at T4-T5. T1-T2: Imaged sagittally. Not included on axial imaging. Disc bulge with endplate spurring. Superimposed small left center disc protrusion. No more than mild spinal canal stenosis is suspected. No compressive foraminal narrowing. T2-T3: Disc bulge. Facet arthrosis. The disc bulge mildly effaces the ventral thecal sac and may contact the ventral spinal cord. No compressive foraminal stenosis. T3-T4: Disc bulge with endplate spurring/osteophytic ridging. Facet arthrosis. Moderate to moderately severe spinal canal stenosis with contact upon the dorsal and ventral spinal cord and mild spinal cord flattening. Bilateral neural foraminal narrowing (moderate right, mild left). T4-T5: Disc bulge with endplate spurring/osteophytic ridging. Facet arthrosis. Moderate to moderately severe spinal canal stenosis with contact upon the dorsal and ventral spinal cord and mild spinal cord flattening. Mild/moderate bilateral neural foraminal narrowing. T5-T6: Anterior bridging osteophyte. Facet arthrosis. No significant spinal canal or foraminal stenosis. The T6-T7 and more caudal thoracic levels are not included on axial imaging. Please refer to the recent prior thoracic spine MRI of 11/23/2021 for a description of thoracic spondylosis at these more caudal levels. IMPRESSION: Problem-oriented MRI to better characterize sites of spinal canal stenosis within the upper thoracic spine, as described. Upper thoracic spondylosis, as outlined and with findings most notably as follows. At T3-T4, there is a disc bulge with  osteophytic ridging. Facet arthrosis. Resultant multifactorial moderate to moderately severe spinal canal stenosis, with contact upon the dorsal and ventral spinal cord, and mild spinal cord flattening. Bilateral neural foraminal narrowing (moderate right, mild left). At T4-T5, there is a disc bulge with osteophytic ridging. Facet arthrosis. Resultant multifactorial moderate to moderately severe spinal canal stenosis, with contact upon the dorsal and ventral spinal cord, and mild spinal cord flattening. Mild/moderate bilateral neural foraminal narrowing. No more than mild spinal canal stenosis, and no compressive foraminal stenosis, at the remaining upper thoracic levels. Apparent solid bridging anterior osteophytes at the T5-T6 through T9-T10 levels. Additional bridging anterior osteophytes were identified more inferiorly within the thoracic spine on the recent prior thoracic spine MRI of 11/23/2021. Electronically Signed   By: Kellie Simmering D.O.   On: 11/24/2021 17:22   MR THORACIC SPINE WO CONTRAST  Result Date: 11/21/2021 CLINICAL DATA:  Mid back pain and bilateral leg weakness. EXAM: MRI THORACIC SPINE WITHOUT CONTRAST TECHNIQUE: Multiplanar, multisequence MR imaging of the thoracic spine was performed. No intravenous contrast was administered. COMPARISON:  None. FINDINGS: Despite efforts by the technologist and patient, motion artifact is present on today's exam and could not be eliminated. This reduces exam sensitivity and specificity. Alignment:  Physiologic. Vertebrae: No fracture, evidence of discitis, or bone lesion. Cord:  Normal signal and morphology. Paraspinal and other soft tissues: Trace bilateral pleural effusions. Disc levels: T1-T2: Small shallow central disc protrusion. Mild bilateral neuroforaminal stenosis. T2-T3: Asymmetric left-sided disc osteophyte complex. Mild spinal canal and bilateral neuroforaminal stenosis. T3-T4: Bulky circumferential disc osteophyte complex and bilateral facet  arthropathy. Moderate spinal canal stenosis. Mild bilateral neuroforaminal stenosis. T4-T5: Broad-based posterior disc osteophyte complex and mild bilateral facet arthropathy. Moderate spinal canal stenosis. Moderate right and mild left  neuroforaminal stenosis. T5-T6: Negative disc. Mild bilateral facet arthropathy. No stenosis. T6-T7: Small left paracentral disc protrusion.  No stenosis. T7-T8: Small central disc protrusion.  No stenosis. T8-T9: Tiny right paracentral disc protrusion.  No stenosis. T9-T10: Negative. T10-T11: Mild disc bulging and moderate bilateral facet arthropathy. Mild spinal canal stenosis. Moderate left greater than right neuroforaminal stenosis. T11-T12: Negative disc. Mild bilateral facet arthropathy. No stenosis. IMPRESSION: 1. Multilevel degenerative changes of the thoracic spine as described above. Moderate spinal canal stenosis at T3-T4 and T4-T5. 2. Moderate neuroforaminal stenosis on the right at T4-T5 and bilaterally at T10-T11. 3. Trace bilateral pleural effusions. Electronically Signed   By: Titus Dubin M.D.   On: 11/21/2021 10:37   MR LUMBAR SPINE WO CONTRAST  Result Date: 11/20/2021 CLINICAL DATA:  Low back pain. EXAM: MRI LUMBAR SPINE WITHOUT CONTRAST TECHNIQUE: Multiplanar, multisequence MR imaging of the lumbar spine was performed. No intravenous contrast was administered. COMPARISON:  None. FINDINGS: Segmentation:  Standard. Alignment: No static listhesis. Dextroscoliosis of the thoracolumbar spine. Vertebrae: No acute fracture, evidence of discitis, or aggressive bone lesion. Conus medullaris and cauda equina: Conus extends to the L1 level. Conus and cauda equina appear normal. Paraspinal and other soft tissues: No acute paraspinal abnormality. Disc levels: Disc spaces: Degenerative disease with disc height loss throughout the thoracolumbar spine. T10-11: Broad-based disc bulge. Moderate bilateral facet arthropathy. Mild spinal stenosis. Moderate bilateral foraminal  stenosis. T11-12: Moderate bilateral facet arthropathy. Mild left foraminal stenosis. No central canal stenosis. T12-L1: Broad-based disc osteophyte complex eccentric towards the right. Severe bilateral facet arthropathy. No left foraminal stenosis. Moderate right foraminal stenosis. L1-L2: Broad-based disc bulge. Mild bilateral facet arthropathy. No foraminal or central canal stenosis. L2-L3: Mild broad-based disc bulge. Moderate bilateral facet arthropathy. Moderate spinal stenosis. Mild left foraminal stenosis. No right foraminal stenosis. L3-L4: Broad-based disc osteophyte complex. Severe bilateral facet arthropathy with ligamentum flavum infolding. Severe spinal stenosis. Severe left foraminal stenosis. Mild right foraminal stenosis. L4-L5: Broad-based disc osteophyte complex. Severe bilateral facet arthropathy with ligamentum flavum infolding. Severe right subarticular recess stenosis. Severe spinal stenosis. Mild left foraminal stenosis. Moderate-severe right foraminal stenosis. L5-S1: Mild broad-based disc bulge with a small left paracentral disc protrusion contacting the left intraspinal S1 nerve root. Severe right and moderate left facet arthropathy. Mild left foraminal stenosis. Severe right foraminal stenosis. IMPRESSION: 1. Diffuse lumbar spine spondylosis as described above. 2. Severe spinal stenosis at L3-4 and L4-5. 3. No acute osseous injury of the lumbar spine. Electronically Signed   By: Kathreen Devoid M.D.   On: 11/20/2021 17:57   MR CERVICAL SPINE W WO CONTRAST  Result Date: 11/23/2021 CLINICAL DATA:  Myelopathy, acute, cervical spine. EXAM: MRI CERVICAL SPINE WITHOUT AND WITH CONTRAST TECHNIQUE: Multiplanar and multiecho pulse sequences of the cervical spine, to include the craniocervical junction and cervicothoracic junction, were obtained without and with intravenous contrast. CONTRAST:  12mL GADAVIST GADOBUTROL 1 MMOL/ML IV SOLN COMPARISON:  None. FINDINGS: The study suffers from motion  degradation due to lack of patient cooperation. Alignment: No malalignment. Vertebrae: No fracture. Benign appearing fat within the T3 vertebral body. Cord: No primary cord lesion.  See below regarding stenosis. Posterior Fossa, vertebral arteries, paraspinal tissues: Negative Disc levels: The foramen magnum is widely patent. There is ordinary mild osteoarthritis of the C1-2 articulation but no encroachment upon the neural structures. C2-3: Mild bulging of the disc.  No compressive stenosis. C3-4 through C6-7: Spondylosis with endplate osteophytes and bulging of the disc at each level. Spinal stenosis at these levels  with AP diameter of the canal ranging from a minimum of 5.5 mm at the C5-6 level to a maximum of 6.8 mm at C4-5, all resulting in a degree of cord deformity. Bilateral foraminal stenosis at those levels that could affect the exiting nerves. C7-T1: Mild bulging of the disc. Mild facet hypertrophy. No compressive canal stenosis. Mild bilateral foraminal narrowing. IMPRESSION: Chronic appearing degenerative spondylosis from C3-4 through C6-7 with canal stenosis throughout that region. Minimal AP diameter is 5.5 mm at the C5-6 level. There is effacement of the subarachnoid space and deformity of the cord at those levels. There is bilateral foraminal stenosis. Bilateral foraminal stenosis at C7-T1, primarily due to encroachment by degenerative facet osteophytes. Electronically Signed   By: Nelson Chimes M.D.   On: 11/23/2021 21:17   MR THORACIC SPINE W WO CONTRAST  Result Date: 11/23/2021 CLINICAL DATA:  Myelopathy, acute, thoracic spine. EXAM: MRI THORACIC WITHOUT AND WITH CONTRAST TECHNIQUE: Multiplanar and multiecho pulse sequences of the thoracic spine were obtained without and with intravenous contrast. CONTRAST:  75mL GADAVIST GADOBUTROL 1 MMOL/ML IV SOLN COMPARISON:  11/21/2021 FINDINGS: Alignment:  Mild scoliotic curvature. Vertebrae: Benign appearing fat within the T3 vertebral body as seen  previously. Apparent solid bridging anterior osteophytes from T5 to T12. Cord: Cord deformity at T3-4 and T4-5 due to stenosis. See below. Other lesser areas of encroachment. See below. Paraspinal and other soft tissues: Negative Disc levels: T1-2: Minimal bulging of the disc.  No stenosis. T2-3: Mild bulging of the disc. Mild narrowing of the ventral subarachnoid space and foramina but without likely neural compression. T3-4: Endplate osteophytes and broad-based disc herniation. Bilateral facet degeneration and hypertrophy. Spinal stenosis with effacement of the subarachnoid space and some cord deformity at this level. Bilateral foraminal stenosis. T4-5: Endplate osteophytes and broad-based disc herniation. Bilateral facet hypertrophy. Spinal stenosis with effacement of the subarachnoid space and cord deformity at this level. Bilateral foraminal stenosis. T5-6: Anterior bridging osteophytes. No stenosis of the canal or foramina. T6-7 and T7-8: Chronic herniated disc material with calcified disc material in the midline versus calcification of the posterior longitudinal ligament. Effacement of the subarachnoid space with ventral cord deformity at both of these levels. Findings could be significant. T8-9: Solid bridging anterior osteophytes. Minimal non-compressive disc bulge. T9-10: Bridging anterior osteophytes. Shallow left posterolateral bulge but no neural compression. T10-11: Solid bridging anterior osteophytes. Mild bulging of the disc. Bilateral facet degeneration and hypertrophy. Canal narrowing with mild cord deformity. Bilateral foraminal stenosis could affect the T10 nerves. T11-12: Bridging anterior osteophytes. Mild bulging of the disc. No stenosis. T12-L1: Disc degeneration with endplate osteophytes and bulging of the disc. Facet and ligamentous hypertrophy worse on the right. Canal stenosis with effacement of the subarachnoid space and slight deformity of the cord. IMPRESSION: Degenerative spondylosis  and facet arthropathy at T3-4 and T4-5, with canal and foraminal stenosis. Cord is deformed at those levels in the patient could suffer from symptoms of myelopathy based on the findings at these levels. Apparent solid bridging osteophytes from T5-T12. Chronic extruded calcified disc material versus calcified longitudinal ligament from T6-T8, effacing the ventral subarachnoid space and indenting the ventral cord. This could be significant as well. T10-11 endplate osteophytes and bulging of the disc in combination with bilateral facet degeneration and hypertrophy. Canal narrowing with mild cord deformity. Bilateral foraminal stenosis could affect either T10 nerve. Electronically Signed   By: Nelson Chimes M.D.   On: 11/23/2021 21:27   DG Chest Port 1 View  Result Date: 11/20/2021  CLINICAL DATA:  Fall. EXAM: PORTABLE CHEST 1 VIEW COMPARISON:  January 25, 2017. FINDINGS: The heart size and mediastinal contours are within normal limits. Both lungs are clear. The visualized skeletal structures are unremarkable. IMPRESSION: No active disease. Electronically Signed   By: Marijo Conception M.D.   On: 11/20/2021 14:39      Subjective: Patient seen and examined at bedside this morning.  Hemodynamically stable for discharge today  Discharge Exam: Vitals:   11/30/21 0759 11/30/21 1144  BP: 102/64 101/61  Pulse: (!) 49 (!) 48  Resp:  18  Temp: 97.8 F (36.6 C) 97.7 F (36.5 C)  SpO2: 99% 97%   Vitals:   11/30/21 0634 11/30/21 0710 11/30/21 0759 11/30/21 1144  BP: 114/60  102/64 101/61  Pulse: (!) 55  (!) 49 (!) 48  Resp: 16   18  Temp: 98 F (36.7 C)  97.8 F (36.6 C) 97.7 F (36.5 C)  TempSrc: Oral  Oral Oral  SpO2: 99% 99% 99% 97%  Weight:      Height:        General: Pt is alert, awake, not in acute distress,obese Cardiovascular: RRR, S1/S2 +, no rubs, no gallops Respiratory: CTA bilaterally, no wheezing, no rhonchi Abdominal: Soft, NT, ND, bowel sounds + Extremities: no edema, no  cyanosis    The results of significant diagnostics from this hospitalization (including imaging, microbiology, ancillary and laboratory) are listed below for reference.     Microbiology: Recent Results (from the past 240 hour(s))  Resp Panel by RT-PCR (Flu A&B, Covid) Nasopharyngeal Swab     Status: None   Collection Time: 11/20/21  2:13 PM   Specimen: Nasopharyngeal Swab; Nasopharyngeal(NP) swabs in vial transport medium  Result Value Ref Range Status   SARS Coronavirus 2 by RT PCR NEGATIVE NEGATIVE Final    Comment: (NOTE) SARS-CoV-2 target nucleic acids are NOT DETECTED.  The SARS-CoV-2 RNA is generally detectable in upper respiratory specimens during the acute phase of infection. The lowest concentration of SARS-CoV-2 viral copies this assay can detect is 138 copies/mL. A negative result does not preclude SARS-Cov-2 infection and should not be used as the sole basis for treatment or other patient management decisions. A negative result may occur with  improper specimen collection/handling, submission of specimen other than nasopharyngeal swab, presence of viral mutation(s) within the areas targeted by this assay, and inadequate number of viral copies(<138 copies/mL). A negative result must be combined with clinical observations, patient history, and epidemiological information. The expected result is Negative.  Fact Sheet for Patients:  EntrepreneurPulse.com.au  Fact Sheet for Healthcare Providers:  IncredibleEmployment.be  This test is no t yet approved or cleared by the [place] FDA and  has been authorized for detection and/or diagnosis of SARS-CoV-2 by FDA under an Emergency Use Authorization (EUA). This EUA will remain  in effect (meaning this test can be used) for the duration of the COVID-19 declaration under Section 564(b)(1) of the Act, 21 U.S.C.section 360bbb-3(b)(1), unless the authorization is terminated  or revoked  sooner.       Influenza A by PCR NEGATIVE NEGATIVE Final   Influenza B by PCR NEGATIVE NEGATIVE Final    Comment: (NOTE) The Xpert Xpress SARS-CoV-2/FLU/RSV plus assay is intended as an aid in the diagnosis of influenza from Nasopharyngeal swab specimens and should not be used as a sole basis for treatment. Nasal washings and aspirates are unacceptable for Xpert Xpress SARS-CoV-2/FLU/RSV testing.  Fact Sheet for Patients: EntrepreneurPulse.com.au  Fact Sheet for Healthcare  Providers: IncredibleEmployment.be  This test is not yet approved or cleared by the [place] and has been authorized for detection and/or diagnosis of SARS-CoV-2 by FDA under an Emergency Use Authorization (EUA). This EUA will remain in effect (meaning this test can be used) for the duration of the COVID-19 declaration under Section 564(b)(1) of the Act, 21 U.S.C. section 360bbb-3(b)(1), unless the authorization is terminated or revoked.  Performed at [facility], [address], [place], [place] [address]      Labs: BNP (last 3 results) No results for input(s): BNP in the last 8760 hours. Basic Metabolic Panel: Recent Labs  Lab 11/25/21 0130 11/28/21 0419 11/30/21 0300  NA 137 135 136  K 4.3 4.6 4.5  CL 104 102 103  CO2 23 23 23   GLUCOSE 136* 123* 94  BUN 25* 27* 28*  CREATININE 1.06* 1.10* 1.10*  CALCIUM 8.7* 8.6* 8.8*   Liver Function Tests: No results for input(s): AST, ALT, ALKPHOS, BILITOT, PROT, ALBUMIN in the last 168 hours. No results for input(s): LIPASE, AMYLASE in the last 168 hours. No results for input(s): AMMONIA in the last 168 hours. CBC: Recent Labs  Lab 11/24/21 0109 11/25/21 0130 11/28/21 0419 11/30/21 0300  WBC 11.2* 13.2* 11.3* 12.8*  NEUTROABS 8.1* 9.6* 8.1* 8.7*  HGB 8.9* 9.8* 10.5* 10.5*  HCT 31.2* 33.6* 37.3 36.0  MCV 66.1* 66.7* 70.0* 70.2*  PLT 494* 505* 468* 431*   Cardiac Enzymes: No results for input(s):  CKTOTAL, CKMB, CKMBINDEX, TROPONINI in the last 168 hours. BNP: Invalid input(s): POCBNP CBG: Recent Labs  Lab 11/29/21 0553 11/29/21 1129 11/29/21 1722 11/29/21 2131 11/30/21 0631  GLUCAP 95 130* 118* 112* 90   D-Dimer No results for input(s): DDIMER in the last 72 hours. Hgb A1c No results for input(s): HGBA1C in the last 72 hours. Lipid Profile No results for input(s): CHOL, HDL, LDLCALC, TRIG, CHOLHDL, LDLDIRECT in the last 72 hours. Thyroid function studies No results for input(s): TSH, T4TOTAL, T3FREE, THYROIDAB in the last 72 hours.  Invalid input(s): FREET3 Anemia work up No results for input(s): VITAMINB12, FOLATE, FERRITIN, TIBC, IRON, RETICCTPCT in the last 72 hours. Urinalysis    Component Value Date/Time   COLORURINE YELLOW 11/21/2021 1640   APPEARANCEUR CLEAR 11/21/2021 1640   LABSPEC 1.023 11/21/2021 1640   PHURINE 5.0 11/21/2021 1640   GLUCOSEU NEGATIVE 11/21/2021 1640   HGBUR NEGATIVE 11/21/2021 1640   BILIRUBINUR NEGATIVE 11/21/2021 1640   KETONESUR NEGATIVE 11/21/2021 1640   PROTEINUR 30 (A) 11/21/2021 1640   NITRITE NEGATIVE 11/21/2021 1640   LEUKOCYTESUR NEGATIVE 11/21/2021 1640   Sepsis Labs Invalid input(s): PROCALCITONIN,  WBC,  LACTICIDVEN Microbiology Recent Results (from the past 240 hour(s))  Resp Panel by RT-PCR (Flu A&B, Covid) Nasopharyngeal Swab     Status: None   Collection Time: 11/20/21  2:13 PM   Specimen: Nasopharyngeal Swab; Nasopharyngeal(NP) swabs in vial transport medium  Result Value Ref Range Status   SARS Coronavirus 2 by RT PCR NEGATIVE NEGATIVE Final    Comment: (NOTE) SARS-CoV-2 target nucleic acids are NOT DETECTED.  The SARS-CoV-2 RNA is generally detectable in upper respiratory specimens during the acute phase of infection. The lowest concentration of SARS-CoV-2 viral copies this assay can detect is 138 copies/mL. A negative result does not preclude SARS-Cov-2 infection and should not be used as the sole basis  for treatment or other patient management decisions. A negative result may occur with  improper specimen collection/handling, submission of specimen other than nasopharyngeal swab, presence  of viral mutation(s) within the areas targeted by this assay, and inadequate number of viral copies(<138 copies/mL). A negative result must be combined with clinical observations, patient history, and epidemiological information. The expected result is Negative.  Fact Sheet for Patients:  EntrepreneurPulse.com.au  Fact Sheet for Healthcare Providers:  IncredibleEmployment.be  This test is no t yet approved or cleared by the [place] FDA and  has been authorized for detection and/or diagnosis of SARS-CoV-2 by FDA under an Emergency Use Authorization (EUA). This EUA will remain  in effect (meaning this test can be used) for the duration of the COVID-19 declaration under Section 564(b)(1) of the Act, 21 U.S.C.section 360bbb-3(b)(1), unless the authorization is terminated  or revoked sooner.       Influenza A by PCR NEGATIVE NEGATIVE Final   Influenza B by PCR NEGATIVE NEGATIVE Final    Comment: (NOTE) The Xpert Xpress SARS-CoV-2/FLU/RSV plus assay is intended as an aid in the diagnosis of influenza from Nasopharyngeal swab specimens and should not be used as a sole basis for treatment. Nasal washings and aspirates are unacceptable for Xpert Xpress SARS-CoV-2/FLU/RSV testing.  Fact Sheet for Patients: EntrepreneurPulse.com.au  Fact Sheet for Healthcare Providers: IncredibleEmployment.be  This test is not yet approved or cleared by the [place] FDA and has been authorized for detection and/or diagnosis of SARS-CoV-2 by FDA under an Emergency Use Authorization (EUA). This EUA will remain in effect (meaning this test can be used) for the duration of the COVID-19 declaration under Section 564(b)(1) of the Act, 21  U.S.C. section 360bbb-3(b)(1), unless the authorization is terminated or revoked.  Performed at [facility], [address], [place], [place] [address]     Please note: You were cared for by a hospitalist during your hospital stay. Once you are discharged, your primary care physician will handle any further medical issues. Please note that NO REFILLS for any discharge medications will be authorized once you are discharged, as it is imperative that you return to your primary care physician (or establish a relationship with a primary care physician if you do not have one) for your post hospital discharge needs so that they can reassess your need for medications and monitor your lab values.    Time coordinating discharge: 40 minutes  SIGNED:   Shelly Coss, MD  [facility] 11/30/2021, 12:30 PM Pager [phone]  If 7PM-7AM, please contact night-coverage [email or web address] Password [email or web address]

## 2021-11-30 NOTE — TOC Transition Note (Signed)
Transition of Care [facility]) - CM/SW Discharge Note   Patient Details  Name: Brandi [name] MRN: [id number] Date of Birth: 1956/05/27  Transition of Care [facility]) CM/SW Contact:  Pollie Friar, RN Phone Number: 11/30/2021, 11:49 AM   Clinical Narrative:    Patient is discharging to CIR today. CM signing off.    Final next level of care: IP Rehab Facility Barriers to Discharge: No Barriers Identified   Patient Goals and CMS Choice     Choice offered to / list presented to : Patient  Discharge Placement                       Discharge Plan and Services                                     Social Determinants of Health (SDOH) Interventions     Readmission Risk Interventions No flowsheet data found.

## 2021-11-30 NOTE — Care Management Important Message (Signed)
Important Message  Patient Details  Name: Brandi [name] MRN: [id number] Date of Birth: 1956/06/23   Medicare Important Message Given:  Yes     Anton Cheramie Montine Circle 11/30/2021, 3:36 PM

## 2021-12-01 ENCOUNTER — Inpatient Hospital Stay (HOSPITAL_COMMUNITY): Payer: Medicare Other

## 2021-12-01 ENCOUNTER — Encounter (HOSPITAL_COMMUNITY): Payer: Self-pay | Admitting: Physical Medicine and Rehabilitation

## 2021-12-01 DIAGNOSIS — E669 Obesity, unspecified: Secondary | ICD-10-CM

## 2021-12-01 DIAGNOSIS — G959 Disease of spinal cord, unspecified: Secondary | ICD-10-CM | POA: Diagnosis not present

## 2021-12-01 DIAGNOSIS — R609 Edema, unspecified: Secondary | ICD-10-CM

## 2021-12-01 DIAGNOSIS — E1169 Type 2 diabetes mellitus with other specified complication: Secondary | ICD-10-CM | POA: Diagnosis not present

## 2021-12-01 DIAGNOSIS — K592 Neurogenic bowel, not elsewhere classified: Secondary | ICD-10-CM

## 2021-12-01 DIAGNOSIS — R339 Retention of urine, unspecified: Secondary | ICD-10-CM

## 2021-12-01 DIAGNOSIS — M7989 Other specified soft tissue disorders: Secondary | ICD-10-CM

## 2021-12-01 LAB — COMPREHENSIVE METABOLIC PANEL
ALT: 18 U/L (ref 0–44)
AST: 13 U/L — ABNORMAL LOW (ref 15–41)
Albumin: 2.9 g/dL — ABNORMAL LOW (ref 3.5–5.0)
Alkaline Phosphatase: 52 U/L (ref 38–126)
Anion gap: 8 (ref 5–15)
BUN: 27 mg/dL — ABNORMAL HIGH (ref 8–23)
CO2: 25 mmol/L (ref 22–32)
Calcium: 9 mg/dL (ref 8.9–10.3)
Chloride: 102 mmol/L (ref 98–111)
Creatinine, Ser: 1.19 mg/dL — ABNORMAL HIGH (ref 0.44–1.00)
GFR, Estimated: 50 mL/min — ABNORMAL LOW (ref >60)
Glucose, Bld: 96 mg/dL (ref 70–99)
Potassium: 4.6 mmol/L (ref 3.5–5.1)
Sodium: 135 mmol/L (ref 135–145)
Total Bilirubin: 0.7 mg/dL (ref 0.3–1.2)
Total Protein: 6 g/dL — ABNORMAL LOW (ref 6.5–8.1)

## 2021-12-01 LAB — CBC WITH DIFFERENTIAL/PLATELET
Abs Immature Granulocytes: 0.17 10*3/uL — ABNORMAL HIGH (ref 0.00–0.07)
Basophils Absolute: 0 10*3/uL (ref 0.0–0.1)
Basophils Relative: 0 %
Eosinophils Absolute: 0 10*3/uL (ref 0.0–0.5)
Eosinophils Relative: 0 %
HCT: 37.3 % (ref 36.0–46.0)
Hemoglobin: 11.1 g/dL — ABNORMAL LOW (ref 12.0–15.0)
Immature Granulocytes: 2 %
Lymphocytes Relative: 28 %
Lymphs Abs: 2.7 10*3/uL (ref 0.7–4.0)
MCH: 20.6 pg — ABNORMAL LOW (ref 26.0–34.0)
MCHC: 29.8 g/dL — ABNORMAL LOW (ref 30.0–36.0)
MCV: 69.1 fL — ABNORMAL LOW (ref 80.0–100.0)
Monocytes Absolute: 0.8 10*3/uL (ref 0.1–1.0)
Monocytes Relative: 8 %
Neutro Abs: 5.9 10*3/uL (ref 1.7–7.7)
Neutrophils Relative %: 62 %
Platelets: 420 10*3/uL — ABNORMAL HIGH (ref 150–400)
RBC: 5.4 MIL/uL — ABNORMAL HIGH (ref 3.87–5.11)
RDW: 27.7 % — ABNORMAL HIGH (ref 11.5–15.5)
WBC: 9.6 10*3/uL (ref 4.0–10.5)
nRBC: 0 % (ref 0.0–0.2)

## 2021-12-01 LAB — GLUCOSE, CAPILLARY
Glucose-Capillary: 97 mg/dL (ref 70–99)
Glucose-Capillary: 146 mg/dL — ABNORMAL HIGH (ref 70–99)
Glucose-Capillary: 135 mg/dL — ABNORMAL HIGH (ref 70–99)
Glucose-Capillary: 148 mg/dL — ABNORMAL HIGH (ref 70–99)

## 2021-12-01 MED ORDER — SORBITOL 70 % SOLN: 30.0000 mL | Freq: Once | Status: DC

## 2021-12-01 MED ORDER — FLEET ENEMA 7-19 GM/118ML RE ENEM: 1.0000 | ENEMA | Freq: Every day | RECTAL | Status: DC | PRN

## 2021-12-01 MED ORDER — SORBITOL 70 % SOLN
60.0000 mL | Freq: Once | Status: AC
  Administered 2021-12-01: 60 mL via ORAL
  Filled 2021-12-01: qty 60

## 2021-12-01 MED ORDER — ENSURE MAX PROTEIN PO LIQD
11.0000 [oz_av] | Freq: Every day | ORAL | Status: DC
  Administered 2021-12-01 – 2021-12-26 (×22): 11 [oz_av] via ORAL

## 2021-12-01 NOTE — Progress Notes (Signed)
Occupational Therapy Session Note  Patient Details  Name: Brandi [name] MRN: [id number] Date of Birth: [birth year]  Today's Date: 12/01/2021 OT Individual Time: 1515-1600 OT Individual Time Calculation (min): 45 min    Short Term Goals: Week 1:  OT Short Term Goal 1 (Week 1): Pt will don UB clothing with CGA OT Short Term Goal 2 (Week 1): Pt will complete sit > stand with max A of 1 using LRAD OT Short Term Goal 3 (Week 1): Pt will complete bed mobility to EOB with CGA  Skilled Therapeutic Interventions/Progress Updates:    Pt received in bed. Discussed with pt her desire to shower and how the evaluating OT recommended using a stedy to the shower. Told pt we would not have time for a shower now, but I could get the necessary DME prepared so she could shower the next session.  Obtained a wide drop arm BSC for pt to use in the shower.  Obtained a stedy lift.  Pt sat to EOB with min A and then needed mod A to scoot to EOB.. To stand in stedy, she was able to pull halfway up but then had great difficulty lifting her torso and extending her legs. With multiple tries, able to lift hips high enough to get hips up to adjust  stedy pads under her.    Pt then leaned over stedy bar and could not maintain a safe upright posture. Told pt that at this time it is not safe for her to use the stedy unless she is with therapy.. changed safety plan to Bed pan only as pt will not be safe to get to [facility] yet. Pt understood recommendations.  It took another few trials to get pt to stand up all the way to remove stedy pads so she could sit back down on the edge of the bed.  Mod A to lift legs into the bed.  Pt adjusted in bed with all needs met.  Alarm set.   Therapy Documentation Precautions:  Precautions Precautions: Fall Restrictions Weight Bearing Restrictions: No   Vital Signs: Therapy Vitals Temp: 98 F (36.7 C) Temp Source: Oral Pulse Rate: (!) 57 Resp: 20 BP: 115/61 Patient Position (if  appropriate): Lying Oxygen Therapy SpO2: 98 % O2 Device: Room Air Pain: Pain Assessment Pain Score: 2  ADL: ADL Eating: Supervision/safety Where Assessed-Eating: Bed level Grooming: Supervision/safety Where Assessed-Grooming: Edge of bed Upper Body Bathing: Minimal assistance Where Assessed-Upper Body Bathing: Edge of bed Lower Body Bathing: Moderate assistance Where Assessed-Lower Body Bathing: Bed level Upper Body Dressing: Minimal cueing, Moderate assistance Where Assessed-Upper Body Dressing: Edge of bed Lower Body Dressing: Dependent Where Assessed-Lower Body Dressing: Bed level Toileting: Unable to assess Toilet Transfer: Unable to assess   Therapy/Group: Individual Therapy  [place] 12/01/2021, 5:34 PM

## 2021-12-01 NOTE — Progress Notes (Signed)
Inpatient Rehabilitation Care Coordinator Assessment and Plan Patient Details  Name: Brandi [name] MRN: [id number] Date of Birth: 12-26-1955  Today's Date: 12/01/2021  Hospital Problems: Principal Problem:   Myelopathy [facility])  Past Medical History:  Past Medical History:  Diagnosis Date   Anemia    Diabetes mellitus without complication ([place])    Hypertension    Stroke ([place]) 2013   Past Surgical History:  Past Surgical History:  Procedure Laterality Date   BOWEL RESECTION N/A 02/01/2017   Procedure: PARTIAL SMALL BOWEL RESECTION;  Surgeon: Aviva Signs, MD;  Location: [facility] ORS;  Service: General;  Laterality: N/A;   COLONOSCOPY WITH PROPOFOL N/A 01/26/2017   Procedure: COLONOSCOPY WITH PROPOFOL;  Surgeon: Daneil Dolin, MD;  Location: [facility] ENDO SUITE;  Service: Endoscopy;  Laterality: N/A;   cyst removed from right breast     ESOPHAGOGASTRODUODENOSCOPY (EGD) WITH PROPOFOL N/A 01/26/2017   Procedure: ESOPHAGOGASTRODUODENOSCOPY (EGD) WITH PROPOFOL;  Surgeon: Daneil Dolin, MD;  Location: [facility] ENDO SUITE;  Service: Endoscopy;  Laterality: N/A;   GIVENS CAPSULE STUDY N/A 01/27/2017   Procedure: GIVENS CAPSULE STUDY;  Surgeon: Daneil Dolin, MD;  Location: [facility] END[facility] SU[facility];  Service: Endoscopy;  Laterality: N/A;   KNEE SURGERY  right knee   LAPAROTOMY N/A 02/01/2017   Procedure: EXPLORATORY LAPAROTOMY;  Surgeon: Aviva Signs, MD;  Location: [facility] ORS;  Service: General;  Laterality: N/A;   POLYPECTOMY  01/26/2017   Procedure: POLYPECTOMY;  Surgeon: Daneil Dolin, MD;  Location: [facility] END[facility];  Service: Endoscopy;;  sigmoid and rectal   Social History:  reports that she quit smoking about 10 years ago. Her smoking use included cigarettes. She has a 25.00 pack-year smoking history. She has never used smokeless tobacco. She reports that she does not drink alcohol and does not use drugs.  Family / Support Systems Marital Status: Single Patient Roles: Parent Spouse/Significant Other:  N/A Children: One adult son- [name] Other Supports: None reported Anticipated Caregiver: [name] Ability/Limitations of Caregiver: None reported Caregiver Availability: 24/7 Family Dynamics: [name] lives in the home with her.  Social History Preferred language: English Religion: [employer] Cultural Background: Unknown due to pt aphasia unale to obtain information., Education: some college Health Literacy - How often do you need to have someone help you when you read instructions, pamphlets, or other written material from your doctor or pharmacy?: Never Writes: Yes Employment Status: Disabled Date Retired/Disabled/Unemployed: 2008 Legal History/Current Legal Issues: Denies Guardian/Conservator: N/A   Abuse/Neglect Abuse/Neglect Assessment Can Be Completed: Yes (Pt has aphasia and unable to communicate some things to SW) Physical Abuse: Denies Verbal Abuse: Denies Sexual Abuse: Denies Exploitation of patient/patient's resources: Denies Self-Neglect: Denies  Patient response to: Social Isolation - How often do you feel lonely or isolated from those around you?: Never  Emotional Status Pt's affect, behavior and adjustment status: Pt in good spirits at time of visit. Recent Psychosocial Issues: Denies Psychiatric History: Denies Substance Abuse History: Denies  Patient / Family Perceptions, Expectations & Goals Pt/Family understanding of illness & functional limitations: Pt has a general understanding of care needs Premorbid pt/family roles/activities: Independence with some ADLs. Pt reports that her son would help with grocery shopping. Anticipated changes in roles/activities/participation: Assistance with ADLs/IADLs Pt/family expectations/goals: Pt goal is to work on walking again.  Community Resources [employer] Scripts: None Premorbid Home Care/DME Agencies: None Transportation available at discharge: Son [name] Is the patient able to respond to transportation  needs?: Yes In the past 12 months, has lack of transportation  kept you from medical appointments or from getting medications?: No In the past 12 months, has lack of transportation kept you from meetings, work, or from getting things needed for daily living?: No Resource referrals recommended: Neuropsychology  Discharge Planning Living Arrangements: Children Support Systems: Children Type of Residence: Private residence Insurance Resources: Multimedia programmer (specify) ([employer] Medicare) Financial Resources: SSD Financial Screen Referred: No Living Expenses: Mortgage Money Management: Patient Does the patient have any problems obtaining your medications?: No Home Management: Pt reports she would prepare meals, and do house cleaning. States her son would help with house cleaning and running errands. Patient/Family Preliminary Plans: TBD Care Coordinator Barriers to Discharge: Decreased caregiver support, Lack of/limited family support, Insurance for SNF coverage Care Coordinator Anticipated Follow Up Needs: HH/OP  Clinical Impression SW met with pt in room to introduce self, explain role, and discuss discharge process. Pt is not a English as a second language teacher. No HCPOA but would designate her son [name]. DME: cane. Pt aware SW will follow-up with her son [name].   1422-SW made several attempts to contact her son [name] [phone]) but no answer, phone just rings and unable to leave a message. SW will continue to make contact with her son.   Per EMR, pt has NCPASR #[id number] A  .  Rana Snare 12/01/2021, 2:33 PM

## 2021-12-01 NOTE — Discharge Instructions (Addendum)
Inpatient Rehab Discharge Instructions  [facility] Discharge date and time: No discharge date for patient encounter.   Activities/Precautions/ Functional Status: Activity: activity as tolerated Diet: regular diet Wound Care: Routine skin checks Functional status:  ___ No restrictions     ___ Walk up steps independently ___ 24/7 supervision/assistance   ___ Walk up steps with assistance ___ Intermittent supervision/assistance  ___ Bathe/dress independently ___ Walk with walker     _x__ Bathe/dress with assistance ___ Walk Independently    ___ Shower independently ___ Walk with assistance    ___ Shower with assistance ___ No alcohol     ___ Return to work/school ________   COMMUNITY REFERRALS UPON DISCHARGE:    Home Health:   PT     OT                     [facility]  Phone: [phone] *Please expect follow-up within 2-3 days to schedule your home visit. If you have not received follow-up, be sure to contact the branch directly.*    Medical Equipment/Items Ordered: wheelchair, transfer board, hospital bed, and bariatric drop arm bedside commode                                                 Agency/Supplier: [facility] [employer] [phone]    Special Instructions: No driving smoking or alcohol   My questions have been answered and I understand these instructions. I will adhere to these goals and the provided educational materials after my discharge from the hospital.  Patient/Caregiver Signature _______________________________ Date __________  Clinician Signature _______________________________________ Date __________  Please bring this form and your medication list with you to all your follow-up doctor's appointments.

## 2021-12-01 NOTE — Progress Notes (Signed)
Inpatient Rehabilitation Admission Medication Review by a Pharmacist  A complete drug regimen review was completed for this patient to identify any potential clinically significant medication issues.  High Risk Drug Classes Is patient taking? Indication by Medication  Antipsychotic No   Anticoagulant Yes Heparin for DVT px  Antibiotic No   Opioid Yes Oxycodone for pain  Antiplatelet Yes ASA - CVA  Hypoglycemics/insulin Yes SSI - DM  Vasoactive Medication Yes Hydralazine - HTN  Chemotherapy No   Other Yes Xanax - anxiety Protonix - GERD Flomax - BPH Zocor - HLD Ferrous sulfate - anemia Trazodone - depression     Type of Medication Issue Identified Description of Issue Recommendation(s)  Drug Interaction(s) (clinically significant)     Duplicate Therapy     Allergy     No Medication Administration End Date     Incorrect Dose  Oxycodone QID Oxycodone PRN on CIR per Marlowe Shores, PA  Additional Drug Therapy Needed  Albuterol, metoprolol Metoprolol dc per Dr Dagoberto Ligas Resume albuterol as needed  Significant med changes from prior encounter (inform family/care partners about these prior to discharge).    Other       Clinically significant medication issues were identified that warrant physician communication and completion of prescribed/recommended actions by midnight of the next day:  No  Name of provider notified for urgent issues identified:   Provider Method of Notification:     Pharmacist comments:   Time spent performing this drug regimen review (minutes):  [place], PharmD, [place], AAHIVP, CPP Infectious Disease Pharmacist 12/01/2021 8:34 AM

## 2021-12-01 NOTE — H&P (Addendum)
Physical Medicine and Rehabilitation Admission H&P         Chief Complaint  Patient presents with   Fall   Extremity Weakness      Pt states she has bad knees.  : HPI: Brandi [name] is a 66 year old right-handed female with history of parasitic anemia, diabetes mellitus hypertension, history of CVA maintained on aspirin, hyperlipidemia, quit smoking 10 years ago.  Per chart review patient lives with children.  1 level home 10 steps to entry.  Independent with straight point cane.  Presented 11/20/2021 with lower extremity weakness progressive paraplegia to [facility] as well as tingling in left foot x2 weeks as well as gait instability.Marland Kitchen  CT/MRI of the brain chronic left temporoparietal encephalomalacia no evidence of acute abnormality.  MRA of the head negative. 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  MRI thoracic spine showed T3-4, T4-5 disc bulge with osteophytic ridging with moderate to severe canal stenosis.  Neurosurgical /neurology follow-up no surgical intervention placed on steroid taper.  Scans reviewed by neurosurgery for etiology of her weakness and did not believe it was from her cervical disease she had no upper extremity involvement.  Suspect myelopathy with upper thoracic sensory level   It was felt that transverse myelitis relatively unlikely.  Awaiting plan for possible myelogram.  Patient was cleared to begin subcutaneous heparin for DVT prophylaxis.  Bouts of acute urinary retention Foley tube removed currently maintained on Flomax.  Chronic anemia hemoglobin 9.5 and monitored.  Therapy evaluations completed due to patient decreased functional mobility was admitted for a comprehensive rehab program.   Review of Systems  Constitutional:  Negative for chills, fever and malaise/fatigue.  HENT:  Negative for hearing loss.   Eyes:  Negative for blurred vision and double vision.  Respiratory:  Negative for  cough and shortness of breath.   Cardiovascular:  Negative for chest pain, palpitations and leg swelling.  Gastrointestinal:  Positive for constipation. Negative for heartburn, nausea and vomiting.  Genitourinary:  Positive for urgency. Negative for dysuria, flank pain and hematuria.  Musculoskeletal:  Positive for myalgias.  Skin:  Negative for rash.  Neurological:  Positive for sensory change and weakness.  Psychiatric/Behavioral:  The patient has insomnia.        Anxiety  All other systems reviewed and are negative.     Past Medical History:  Diagnosis Date   Anemia     Diabetes mellitus without complication ([place])     Hypertension     Stroke ([place]) 2013         Past Surgical History:  Procedure Laterality Date   BOWEL RESECTION N/A 02/01/2017    Procedure: PARTIAL SMALL BOWEL RESECTION;  Surgeon: Aviva Signs, MD;  Location: [facility];  Service: General;  Laterality: N/A;   COLONOSCOPY WITH PROPOFOL N/A 01/26/2017    Procedure: COLONOSCOPY WITH PROPOFOL;  Surgeon: Daneil Dolin, MD;  Location: [facility];  Service: Endoscopy;  Laterality: N/A;   cyst removed from right breast       ESOPHAGOGASTRODUODENOSCOPY (EGD) WITH PROPOFOL N/A 01/26/2017    Procedure: ESOPHAGOGASTRODUODENOSCOPY (EGD) WITH PROPOFOL;  Surgeon: Daneil Dolin, MD;  Location: [facility];  Service: Endoscopy;  Laterality: N/A;   GIVENS CAPSULE STUDY N/A 01/27/2017    Procedure: GIVENS CAPSULE STUDY;  Surgeon: Daneil Dolin, MD;  Location: [facility];  Service: Endoscopy;  Laterality: N/A;   KNEE SURGERY   right knee  LAPAROTOMY N/A 02/01/2017    Procedure: EXPLORATORY LAPAROTOMY;  Surgeon: Aviva Signs, MD;  Location: [facility];  Service: General;  Laterality: N/A;   POLYPECTOMY   01/26/2017    Procedure: POLYPECTOMY;  Surgeon: Daneil Dolin, MD;  Location: [facility];  Service: Endoscopy;;  sigmoid and rectal         Family History  Problem Relation Age of Onset   Heart attack Father     Ulcers Mother           stomach    Social History:  reports that she quit smoking about 10 years ago. Her smoking use included cigarettes. She has a 25.00 pack-year smoking history. She has never used smokeless tobacco. She reports that she does not drink alcohol and does not use drugs. Allergies:       Allergies  Allergen Reactions   Codeine        Unknown           Medications Prior to Admission  Medication Sig Dispense Refill   albuterol (VENTOLIN HFA) 108 (90 Base) MCG/ACT inhaler Inhale into the lungs.       ALPRAZolam (XANAX) 0.25 MG tablet Take 0.25 mg by mouth 2 (two) times daily as needed.       aspirin 325 MG tablet Take 325 mg by mouth daily.       hydrALAZINE (APRESOLINE) 25 MG tablet Take 25 mg by mouth daily.       lisinopril (ZESTRIL) 20 MG tablet Take 20 mg by mouth daily.       metoprolol (LOPRESSOR) 100 MG tablet Take 0.5 tablets (50 mg total) by mouth 2 (two) times daily. 120 tablet 0   Oxycodone HCl 10 MG TABS Take 5 mg by mouth 4 (four) times daily.   0   simvastatin (ZOCOR) 20 MG tablet Take 20 mg by mouth daily.       traZODone (DESYREL) 150 MG tablet Take 150 mg by mouth at bedtime.       HYDROcodone-acetaminophen (NORCO/VICODIN) 5-325 MG tablet Take 1 tablet by mouth every 6 (six) hours as needed for moderate pain. (Patient not taking: Reported on 02/22/2017) 30 tablet 0   traMADol (ULTRAM) 50 MG tablet Take 1 tablet (50 mg total) by mouth every 6 (six) hours as needed. (Patient not taking: Reported on 11/20/2021) 10 tablet 0          Home: Home Living Family/patient expects to be discharged to:: Private residence Living Arrangements: Children Available Help at Discharge: Family, Available 24 hours/day Type of Home: House Home Access: Stairs to enter [employer] of Steps: 10 Entrance Stairs-Rails: Right Home Layout: One level Bathroom Shower/Tub: Chiropodist: Standard Home Equipment: Cane - single point Additional Comments: son lives with  pt   Functional History: Prior Function Prior Level of Function : Independent/Modified Independent Mobility Comments: mobility with SPC ADLs Comments: mod I, bathes sitting on the side of the tub, cooks/cleans   Functional Status:  Mobility: Bed Mobility Overal bed mobility: Needs Assistance Bed Mobility: Rolling, Sidelying to Sit Rolling: Mod assist, +2 for physical assistance Sidelying to sit: +2 for physical assistance, Mod assist, HOB elevated Supine to sit: Mod assist, HOB elevated Sit to supine: Max assist Sit to sidelying: Max assist General bed mobility comments: pt with motor planning and processing difficulties requiring max verbal and tactile cues, especially with reaching across body, modA for trunk elevation and anterior scooting to foot flat Transfers Overall transfer level: Needs assistance Equipment  used: Sliding board Transfers: Bed to chair/wheelchair/BSC Sit to Stand: Max assist, +2 physical assistance, From elevated surface Bed to/from chair/wheelchair/BSC transfer type:: Lateral/scoot transfer  Lateral/Scoot Transfers: With slide board, +2 physical assistance, Max assist General transfer comment: pt dependent for slide board placement, pt given step by step verbal cues to lean to the L to offweight R hip as pt trying to push self across board to the chair to her R. constant verbal cues to lean forward and away from the chair. pt with solid effort but difficulty motor planning ultimately needing maxA +2 to reach chair. Ambulation/Gait General Gait Details: unable at this time   ADL: ADL Overall ADL's : Needs assistance/impaired Eating/Feeding: Independent, Sitting Grooming: Set up, Sitting Grooming Details (indicate cue type and reason): performed seated on EOB with min guard for balance due to reliant on at least one extremity for balance Upper Body Bathing: Minimal assistance, Sitting Lower Body Bathing: Maximal assistance, Bed level, +2 for physical  assistance Upper Body Dressing : Minimal assistance, Sitting Upper Body Dressing Details (indicate cue type and reason): changed gowns seated on EOB Lower Body Dressing: Maximal assistance, +2 for physical assistance, Bed level Toilet Transfer: Moderate assistance, +2 for physical assistance, +2 for safety/equipment, Requires wide/bariatric, Requires drop arm, Transfer board Toilet Transfer Details (indicate cue type and reason): simulated. bed>chair with slide board. requires simple one step directions & transfer toward R so she can push with L Toileting- Clothing Manipulation and Hygiene: Maximal assistance, Bed level Functional mobility during ADLs: Moderate assistance, +2 for physical assistance, +2 for safety/equipment General ADL Comments: good sitting balance today with cues for body positioning. Initally with posteror lean, able to self correct. session focused on slide board transfer OOB - max cues but min-mod physical assist   Cognition: Cognition Overall Cognitive Status: Impaired/Different from baseline Orientation Level: Oriented X4 Cognition Arousal/Alertness: Awake/alert Behavior During Therapy: WFL for tasks assessed/performed Overall Cognitive Status: Impaired/Different from baseline Area of Impairment: Attention, Following commands, Safety/judgement, Awareness, Problem solving Orientation Level: Disoriented to, Time Current Attention Level: Sustained Following Commands: Follows one step commands with increased time, Follows one step commands inconsistently (pt with very hard time with R and L, processing cross body cues like reach across with your R hand to the L hand rain requiring max verbal and tactile cues) Safety/Judgement: Decreased awareness of deficits, Decreased awareness of safety Awareness: Emergent Problem Solving: Slow processing, Requires verbal cues, Difficulty sequencing, Requires tactile cues General Comments: pt with noted processing difficulty requiring  constant verbal cues and tactile cues to complete tasks. pt with poor STM ie. hand placement on slide board, seems to have motor planning issues, multimodal cues needed to complete all tasks. Eager to participate   Physical Exam: Blood pressure 101/61, pulse (!) 48, temperature 97.7 F (36.5 C), temperature source Oral, resp. rate 18, height 5\' 6"  (1.676 m), weight 111 kg, SpO2 97 %. Physical Exam  General: Alert and oriented x 3, No apparent distress, obese HEENT: Head is normocephalic, atraumatic, PERRLA, EOMI, sclera anicteric, oral mucosa pink and moist, dentition intact, ext ear canals clear,  Neck: Supple without JVD or lymphadenopathy Heart: Reg rate and rhythm. No murmurs rubs or gallops Chest: CTA bilaterally without wheezes, rales, or rhonchi; no distress Abdomen: Soft, non-tender, non-distended, bowel sounds positive. Extremities: No clubbing, cyanosis, tr LE edema. Pulses are 2+ Psych: Pt's affect is appropriate. Pt is cooperative Skin: Clean and intact without signs of breakdown Neuro:  Alert and oriented x 3. Normal insight and awareness.  Intact Memory. Normal language and speech. Cranial nerve exam unremarkable. UE motor 5/5. LE: 2/5 HF, HE. KE 2+, ADF/APF 4- to 4/5. Right leg 1/2 grade weaker than left. Stocking glove sensory loss in both ankles/feet. No sensory level in abdomen. DTR's 1+. No abnormal tone Musculoskeletal: Full ROM, No pain with  PROM in the neck, trunk, or extremities.       Lab Results Last 48 Hours        Results for orders placed or performed during the hospital encounter of 11/20/21 (from the past 48 hour(s))  Glucose, capillary     Status: Abnormal    Collection Time: 11/28/21  3:33 PM  Result Value Ref Range    Glucose-Capillary 163 (H) 70 - 99 mg/dL      Comment: Glucose reference range applies only to samples taken after fasting for at least 8 hours.  Glucose, capillary     Status: Abnormal    Collection Time: 11/28/21 11:01 PM  Result Value Ref  Range    Glucose-Capillary 126 (H) 70 - 99 mg/dL      Comment: Glucose reference range applies only to samples taken after fasting for at least 8 hours.  Glucose, capillary     Status: None    Collection Time: 11/29/21  5:53 AM  Result Value Ref Range    Glucose-Capillary 95 70 - 99 mg/dL      Comment: Glucose reference range applies only to samples taken after fasting for at least 8 hours.  Glucose, capillary     Status: Abnormal    Collection Time: 11/29/21 11:29 AM  Result Value Ref Range    Glucose-Capillary 130 (H) 70 - 99 mg/dL      Comment: Glucose reference range applies only to samples taken after fasting for at least 8 hours.  Glucose, capillary     Status: Abnormal    Collection Time: 11/29/21  5:22 PM  Result Value Ref Range    Glucose-Capillary 118 (H) 70 - 99 mg/dL      Comment: Glucose reference range applies only to samples taken after fasting for at least 8 hours.  Glucose, capillary     Status: Abnormal    Collection Time: 11/29/21  9:31 PM  Result Value Ref Range    Glucose-Capillary 112 (H) 70 - 99 mg/dL      Comment: Glucose reference range applies only to samples taken after fasting for at least 8 hours.  CBC with Differential/Platelet     Status: Abnormal    Collection Time: 11/30/21  3:00 AM  Result Value Ref Range    WBC 12.8 (H) 4.0 - 10.5 K/uL    RBC 5.13 (H) 3.87 - 5.11 MIL/uL    Hemoglobin 10.5 (L) 12.0 - 15.0 g/dL    HCT 36.0 36.0 - 46.0 %    MCV 70.2 (L) 80.0 - 100.0 fL    MCH 20.5 (L) 26.0 - 34.0 pg    MCHC 29.2 (L) 30.0 - 36.0 g/dL    RDW 26.6 (H) 11.5 - 15.5 %    Platelets 431 (H) 150 - 400 K/uL    nRBC 0.0 0.0 - 0.2 %    Neutrophils Relative % 67 %    Neutro Abs 8.7 (H) 1.7 - 7.7 K/uL    Lymphocytes Relative 22 %    Lymphs Abs 2.8 0.7 - 4.0 K/uL    Monocytes Relative 8 %    Monocytes Absolute 1.0 0.1 - 1.0 K/uL    Eosinophils Relative 1 %  Eosinophils Absolute 0.1 0.0 - 0.5 K/uL    Basophils Relative 0 %    Basophils Absolute 0.0 0.0 -  0.1 K/uL    Immature Granulocytes 2 %    Abs Immature Granulocytes 0.27 (H) 0.00 - 0.07 K/uL      Comment: Performed at [facility] [address], [place], [place] [address]  Basic metabolic panel     Status: Abnormal    Collection Time: 11/30/21  3:00 AM  Result Value Ref Range    Sodium 136 135 - 145 mmol/L    Potassium 4.5 3.5 - 5.1 mmol/L    Chloride 103 98 - 111 mmol/L    CO2 23 22 - 32 mmol/L    Glucose, Bld 94 70 - 99 mg/dL      Comment: Glucose reference range applies only to samples taken after fasting for at least 8 hours.    BUN 28 (H) 8 - 23 mg/dL    Creatinine, Ser 1.10 (H) 0.44 - 1.00 mg/dL    Calcium 8.8 (L) 8.9 - 10.3 mg/dL    GFR, Estimated 55 (L) >60 mL/min      Comment: (NOTE) Calculated using the CKD-EPI Creatinine Equation (2021)      Anion gap 10 5 - 15      Comment: Performed at [facility] [address] [place], [place] [address]  Glucose, capillary     Status: None    Collection Time: 11/30/21  6:31 AM  Result Value Ref Range    Glucose-Capillary 90 70 - 99 mg/dL      Comment: Glucose reference range applies only to samples taken after fasting for at least 8 hours.      Imaging Results (Last 48 hours)  No results found.         Blood pressure 101/61, pulse (!) 48, temperature 97.7 F (36.5 C), temperature source Oral, resp. rate 18, height 5\' 6"  (1.676 m), weight 111 kg, SpO2 97 %.   Medical Problem List and Plan: 1. 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 - Decadron taper             -patient may shower             -ELOS/Goals: 10-12 days, supervision goals with PT and OT 2.  Antithrombotics: -DVT/anticoagulation:  Pharmaceutical: Heparin.  Check vascular study             -antiplatelet therapy: Aspirin 325 mg daily 3. Pain Management: Oxycodone as needed 4. Mood: Trazodone 150 mg nightly, Xanax 0.25 mg twice daily as needed             -antipsychotic agents: N/A 5. Neuropsych: This patient is capable of  making decisions on his own behalf. 6. Skin/Wound Care: Routine skin checks  -encourage appropriate nutrition   -pressure relief 7. Fluids/Electrolytes/Nutrition:    I personally reviewed the patient's labs today.    -add protein supp for low albumin 8.  Diabetes mellitus.  Hemoglobin A1c 6.0.     CBG (last 3)  Recent Labs    11/30/21 1645 11/30/21 2154 12/01/21 0601  GLUCAP 154* 130* 97    2/17 cbg's seem to be falling with steroid taper  -cover with SSI for now 9.  Hypertension.  Lopressor 25 mg twice daily, hydralazine 25 mg every 8 hours.   2/17 bp well controlled 10.  Hyperlipidemia.  Zocor 11.  Microcytic anemia.  Continue iron supplement.  Follow-up CBC 12.  Acute urinary retention/neurogenic bladder.  Foley tube removed.   -Presently on Flomax 0.4 mg.   -need pvr's 13.  Constipation/neurogenic bowel.   -Senokot twice daily, MiraLAX daily 2/17-had small, hard bm yesterday and minimal before  Give sorbitol today  -fleet enema if needed  14.  Obesity.  BMI 39.5.  Dietary follow-up 15.  History of CVA.  Modified independent with straight point cane prior to admission.  Continue aspirin 16.  Leukocytosis.  Suspect steroid-induced--improved to 9.6 2/17   Lavon Paganini Angiulli, PA-C 11/30/2021   I have personally performed a face to face diagnostic evaluation of this patient and formulated the key components of the plan.  Additionally, I have personally reviewed laboratory data, imaging studies, as well as relevant notes and concur with the physician assistant's documentation above.  The patient's status has not changed from the original H&P.  Any changes in documentation from the acute care chart have been noted above.  Meredith Staggers, MD, Mellody Drown

## 2021-12-01 NOTE — Evaluation (Signed)
Physical Therapy Assessment and Plan  Patient Details  Name: Brandi [name] MRN: [id number] Date of Birth: 05/21/1956  PT Diagnosis: Coordination disorder, Difficulty walking, Hypotonia, Impaired sensation, Muscle weakness, and Paraplegia Rehab Potential: Fair ELOS: 21-25 days   Today's Date: 12/01/2021 PT Individual Time: 1302-1400 PT Individual Time Calculation (min): 58 min    Hospital Problem: Principal Problem:   Myelopathy ([place])   Past Medical History:  Past Medical History:  Diagnosis Date   Anemia    Diabetes mellitus without complication ([place])    Hypertension    Stroke ([place]) 2013   Past Surgical History:  Past Surgical History:  Procedure Laterality Date   BOWEL RESECTION N/A 02/01/2017   Procedure: PARTIAL SMALL BOWEL RESECTION;  Surgeon: Aviva Signs, MD;  Location: [facility];  Service: General;  Laterality: N/A;   COLONOSCOPY WITH PROPOFOL N/A 01/26/2017   Procedure: COLONOSCOPY WITH PROPOFOL;  Surgeon: Daneil Dolin, MD;  Location: [facility];  Service: Endoscopy;  Laterality: N/A;   cyst removed from right breast     ESOPHAGOGASTRODUODENOSCOPY (EGD) WITH PROPOFOL N/A 01/26/2017   Procedure: ESOPHAGOGASTRODUODENOSCOPY (EGD) WITH PROPOFOL;  Surgeon: Daneil Dolin, MD;  Location: [facility];  Service: Endoscopy;  Laterality: N/A;   GIVENS CAPSULE STUDY N/A 01/27/2017   Procedure: GIVENS CAPSULE STUDY;  Surgeon: Daneil Dolin, MD;  Location: [facility];  Service: Endoscopy;  Laterality: N/A;   KNEE SURGERY  right knee   LAPAROTOMY N/A 02/01/2017   Procedure: EXPLORATORY LAPAROTOMY;  Surgeon: Aviva Signs, MD;  Location: [facility];  Service: General;  Laterality: N/A;   POLYPECTOMY  01/26/2017   Procedure: POLYPECTOMY;  Surgeon: Daneil Dolin, MD;  Location: [facility];  Service: Endoscopy;;  sigmoid and rectal    Assessment & Plan Clinical Impression: Patient is a 66 year old right-handed female with history of parasitic anemia, diabetes mellitus  hypertension, history of CVA maintained on aspirin, hyperlipidemia, quit smoking 10 years ago.  Per chart review patient lives with children.  1 level home 10 steps to entry.  Independent with straight point cane.  Presented 11/20/2021 with lower extremity weakness progressive paraplegia to [facility] as well as tingling in left foot x2 weeks as well as gait instability.Marland Kitchen  CT/MRI of the brain chronic left temporoparietal encephalomalacia no evidence of acute abnormality.  MRA of the head negative. 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  MRI thoracic spine showed T3-4, T4-5 disc bulge with osteophytic ridging with moderate to severe canal stenosis.  Neurosurgical /neurology follow-up no surgical intervention placed on steroid taper.  Scans reviewed by neurosurgery for etiology of her weakness and did not believe it was from her cervical disease she had no upper extremity involvement.  Suspect myelopathy with upper thoracic sensory level   It was felt that transverse myelitis relatively unlikely.  Awaiting plan for possible myelogram.  Patient was cleared to begin subcutaneous heparin for DVT prophylaxis.  Bouts of acute urinary retention Foley tube removed currently maintained on Flomax.  Chronic anemia hemoglobin 9.5 and monitored.  Therapy evaluations completed due to patient decreased functional mobility was admitted for a comprehensive rehab program.  Patient transferred to CIR on 11/30/2021 .   Patient currently requires total with mobility secondary to muscle weakness and muscle joint tightness, decreased cardiorespiratoy endurance, abnormal tone, unbalanced muscle activation, and decreased coordination, and decreased sitting balance, decreased standing balance, decreased postural control, and decreased balance strategies.  Prior to hospitalization, patient  was modified independent  with mobility and lived with Son in a House home.  Home  access is 2 in the backStairs to enter.  Patient will benefit from skilled PT intervention to maximize safe functional mobility, minimize fall risk, and decrease caregiver burden for planned discharge home with 24 hour assist.  Anticipate patient will benefit from follow up [facility] at discharge.  PT - End of Session Activity Tolerance: Tolerates < 10 min activity, no significant change in vital signs Endurance Deficit Description: generalized weakness/deconditioning PT Assessment Rehab Potential (ACUTE/IP ONLY): Fair PT Barriers to Discharge: [place] home environment;Decreased caregiver support;Home environment access/layout;Lack of/limited family support;Insurance for SNF coverage;Weight PT Patient demonstrates impairments in the following area(s): Balance;Edema;Endurance;Motor;Pain;Safety;Sensory PT Transfers Functional Problem(s): Bed Mobility;Bed to Chair;Car;Furniture PT Locomotion Functional Problem(s): Wheelchair Mobility;Ambulation;Stairs PT Plan PT Intensity: Minimum of 1-2 x/day ,45 to 90 minutes PT Frequency: 5 out of 7 days PT Duration Estimated Length of Stay: 21-25 days PT Treatment/Interventions: Ambulation/gait training;Discharge planning;Functional mobility training;Psychosocial support;Therapeutic Activities;Visual/perceptual remediation/compensation;Balance/vestibular training;Disease management/prevention;Neuromuscular re-education;Skin care/wound management;Therapeutic Exercise;Wheelchair propulsion/positioning;Cognitive remediation/compensation;DME/adaptive equipment instruction;Pain management;Splinting/orthotics;Community reintegration;Functional electrical stimulation;Patient/family education;Stair training;UE/LE Coordination activities;UE/LE Strength taining/ROM PT Transfers Anticipated Outcome(s): min assist with LRAD to and from [facility] PT Locomotion Anticipated Outcome(s): supervision assist WC level PT Recommendation Recommendations for Other Services: Therapeutic  Recreation consult Therapeutic Recreation Interventions: Stress management Patient destination: Home Equipment Recommended: Wheelchair (measurements);Wheelchair cushion (measurements);To be determined   PT Evaluation Precautions/Restrictions Precautions Precautions: Fall Restrictions Weight Bearing Restrictions: No General Chart Reviewed: Yes Vital SignsTherapy Vitals Temp: 98.7 F (37.1 C) Pulse Rate: (!) 53 (rechecked) Resp: 14 BP: (!) 102/53 Patient Position (if appropriate): Lying Oxygen Therapy SpO2: 98 % O2 Device: Room Air Pain Pain Assessment Pain Scale: 0-10 Pain Score: 3  Pain Type: Acute pain Pain Location: Knee Pain Orientation: Right;Left Pain Descriptors / Indicators: Aching Pain Onset: On-going Patients Stated Pain Goal: 3 Pain Intervention(s): Medication (See eMAR) Pain Interference Pain Interference Pain Effect on Sleep: 3. Frequently Pain Interference with Therapy Activities: 3. Frequently Pain Interference with Day-to-Day Activities: 3. Frequently Home Living/Prior Functioning Home Living Available Help at Discharge: Family;Available 24 hours/day Type of Home: House Home Access: Stairs to enter [employer] of Steps: 2 in the back Entrance Stairs-Rails: None Home Layout: One level Bathroom Shower/Tub: Chiropodist: Standard Bathroom Accessibility: Yes Additional Comments: son lives with pt  Lives With: Son Prior Function Level of Independence: Independent with basic ADLs;Needs assistance with gait;Independent with homemaking with ambulation;Requires assistive device for independence (cane in the community)  Able to Take Stairs?: Yes Driving: No Vocation: Retired Leisure: Hobbies-yes (Comment) (church, visiting friends) Vision/Perception  Vision - History Ability to See in Adequate Light: 0 Adequate Perception Perception: Within Functional Limits Praxis Praxis: Intact  Cognition Overall Cognitive Status:  History of cognitive impairments - at baseline Arousal/Alertness: Awake/alert Orientation Level: Oriented X4 Year: 2023 Month: February Day of Week: Correct Attention: Selective Selective Attention: Appears intact Memory: Appears intact Awareness: Appears intact Problem Solving: Appears intact Safety/Judgment: Appears intact Comments: 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 Pt with slow processing and sequencing at times Sensation Sensation Light Touch: Impaired Detail Central sensation comments: Residual R diminished sensation Light Touch Impaired Details: Impaired RUE;Impaired RLE;Impaired LLE Coordination Gross Motor Movements are Fluid and Coordinated: No Fine Motor Movements are Fluid and Coordinated: No Coordination and Movement Description: Residual R hemi and BLE weakness Finger Nose Finger Test: Coordination deficits on the RUE Heel Shin Test: significant limitations on the RLE. mild limitations on the LLE  Motor  Motor Motor: Hemiplegia;Other (comment);Paraplegia Motor - Skilled Clinical Observations: residual R weakness and BLE weakness with generalized deconditioning   Trunk/Postural Assessment  Cervical Assessment Cervical Assessment: Within Functional Limits Thoracic Assessment Thoracic Assessment: Exceptions to [facility] (rounded shoulders) Lumbar Assessment Lumbar Assessment: Exceptions to [facility] (posterior pelvic tilt) Postural Control Postural Control: Deficits on evaluation Righting Reactions: delayed Protective Responses: delayed  Balance Balance Balance Assessed: Yes Static Sitting Balance Static Sitting - Balance Support: Feet supported Static Sitting - Level of Assistance: 5: Stand by assistance Dynamic Sitting Balance Dynamic Sitting - Balance Support: During functional activity;Feet supported Dynamic Sitting - Level of Assistance: 4: Min assist Dynamic Standing Balance Dynamic Standing - Comments: unable to achieve full  standing Extremity Assessment      RLE Assessment RLE Assessment: Exceptions to [facility] General Strength Comments: grossly 3-/5 LLE Assessment LLE Assessment: Exceptions to [facility] General Strength Comments: 3/5 proximal to distal  Care Tool Care Tool Bed Mobility Roll left and right activity   Roll left and right assist level: Minimal Assistance - Patient > 75%    Sit to lying activity   Sit to lying assist level: Moderate Assistance - Patient 50 - 74%    Lying to sitting on side of bed activity   Lying to sitting on side of bed assist level: the ability to move from lying on the back to sitting on the side of the bed with no back support.: Moderate Assistance - Patient 50 - 74%     Care Tool Transfers Sit to stand transfer   Sit to stand assist level: 2 Helpers    Chair/bed transfer   Chair/bed transfer assist level: 2 Public relations account executive transfer activity did not occur: Safety/medical concerns        Care Tool Locomotion Ambulation Ambulation activity did not occur: Safety/medical concerns        Walk 10 feet activity Walk 10 feet activity did not occur: Safety/medical concerns       Walk 50 feet with 2 turns activity Walk 50 feet with 2 turns activity did not occur: Safety/medical concerns      Walk 150 feet activity Walk 150 feet activity did not occur: Safety/medical concerns      Walk 10 feet on uneven surfaces activity Walk 10 feet on uneven surfaces activity did not occur: Safety/medical concerns      Stairs Stair activity did not occur: Safety/medical concerns        Walk up/down 1 step activity Walk up/down 1 step or curb (drop down) activity did not occur: Safety/medical concerns      Walk up/down 4 steps activity Walk up/down 4 steps activity did not occur: Safety/medical concerns      Walk up/down 12 steps activity Walk up/down 12 steps activity did not occur: Safety/medical concerns      Pick up small objects  from floor Pick up small object from the floor (from standing position) activity did not occur: Safety/medical concerns      Wheelchair Is the patient using a wheelchair?: Yes Type of Wheelchair: Manual   Wheelchair assist level: Moderate Assistance - Patient 50 - 74% Max wheelchair distance: 150  Wheel 50 feet with 2 turns activity   Assist Level: Moderate Assistance - Patient 50 - 74%  Wheel 150 feet activity   Assist Level: Moderate Assistance - Patient 50 - 74%    Refer to Care Plan for Long Term  Goals  SHORT TERM GOAL WEEK 1 PT Short Term Goal 1 (Week 1): Pt will transfer to [facility] with Slide board and max assist PT Short Term Goal 2 (Week 1): Pt will performed sit<>stand in parallel bars with max assist +2 PT Short Term Goal 3 (Week 1): Pt will propel WC 18f with supervision assist PT Short Term Goal 4 (Week 1): pt will tolerate sitting up in WC >2 hours [facility] Recommendations for other services: Therapeutic Recreation  Stress management  Skilled Therapeutic Intervention Mobility Bed Mobility Bed Mobility: Sit to Supine;Supine to Sit;Rolling Right;Rolling Left Rolling Right: Minimal Assistance - Patient > 75% Rolling Left: Minimal Assistance - Patient > 75% Supine to Sit: Minimal Assistance - Patient > 75%;Moderate Assistance - Patient 50-74% Sit to Supine: Moderate Assistance - Patient 50-74% Transfers Transfers: Sit to Stand;Stand to Sit Sit to Stand: Moderate Assistance - Patient 50-74%;Maximal Assistance - Patient 25-49% Stand to Sit: Moderate Assistance - Patient 50-74% Transfer via Lift Equipment: SProbation officerAmbulation: No Gait Gait: No Stairs / Additional Locomotion Stairs: No WArchitect Yes Wheelchair Assistance: Moderate Assistance - Patient 50 - 74% Wheelchair Propulsion: Both upper extremities Wheelchair Parts Management: Needs assistance   Pt received supine in bed and agreeable to PT.  Supine>sit transfer with mod assist and heavy use of bed rails. PT instructed patient in PT Evaluation and initiated treatment intervention; see above for results. PT [place], rehab goals, and discharge recommendations along with recommendation for follow-up rehabilitation services.  [facility] and [facility] elevated. WC mobility as listed. Attemptes sit<>stand in parallel bars, but unable to engage knee extension with assist of 1 to attain standing. Pt returned to room and performed stedy transfer to bed with max assist. Sit>supine completed with mod-max assist, and left supine in bed with call bell in reach and all needs met.     Discharge Criteria: Patient will be discharged from PT if patient refuses treatment 3 consecutive times without medical reason, if treatment goals not met, if there is a change in medical status, if patient makes no progress towards goals or if patient is discharged from hospital.  The above assessment, treatment plan, treatment alternatives and goals were discussed and mutually agreed upon: by patient  ALorie Phenix2/17/2023, 11:26 PM

## 2021-12-01 NOTE — Progress Notes (Addendum)
Received a call from [facility]  on 11/30/2020 at 22:10 reporting Brandi [name] was bradycardic. Her Metoprolol was held due to her bradycardia. Hydralazine was also held and EKG was ordered. She verbalize understanding. Monissa was unable to transmit the EKG in chart, she asked the charge nurse for help as well, she reports.

## 2021-12-01 NOTE — Progress Notes (Addendum)
Received a call from [facility] at 6:15 reporting vitals on Brandi [name], her hydralazine was held. She was asked to leave note for MD to discuss her antihypertensive medication, she verbalizes understanding.  Will send a message to  Linna Hoff to ask if he could review the EKG.

## 2021-12-01 NOTE — Progress Notes (Signed)
Lower extremity venous has been completed.   Preliminary results in CV Proc.   Brandi [name] 12/01/2021 10:49 AM

## 2021-12-01 NOTE — Evaluation (Signed)
Occupational Therapy Assessment and Plan  Patient Details  Name: Brandi [name] MRN: [id number] Date of Birth: 03/21/1956  OT Diagnosis: acute pain and muscle weakness (generalized) Rehab Potential: Rehab Potential (ACUTE ONLY): Good ELOS: 3 weeks   Today's Date: 12/01/2021 OT Individual Time: 0850-1000 OT Individual Time Calculation (min): 70 min     Hospital Problem: Principal Problem:   Myelopathy ([place])   Past Medical History:  Past Medical History:  Diagnosis Date   Anemia    Diabetes mellitus without complication ([place])    Hypertension    Stroke ([place]) 2013   Past Surgical History:  Past Surgical History:  Procedure Laterality Date   BOWEL RESECTION N/A 02/01/2017   Procedure: PARTIAL SMALL BOWEL RESECTION;  Surgeon: Aviva Signs, MD;  Location: [facility];  Service: General;  Laterality: N/A;   COLONOSCOPY WITH PROPOFOL N/A 01/26/2017   Procedure: COLONOSCOPY WITH PROPOFOL;  Surgeon: Daneil Dolin, MD;  Location: [facility];  Service: Endoscopy;  Laterality: N/A;   cyst removed from right breast     ESOPHAGOGASTRODUODENOSCOPY (EGD) WITH PROPOFOL N/A 01/26/2017   Procedure: ESOPHAGOGASTRODUODENOSCOPY (EGD) WITH PROPOFOL;  Surgeon: Daneil Dolin, MD;  Location: [facility];  Service: Endoscopy;  Laterality: N/A;   GIVENS CAPSULE STUDY N/A 01/27/2017   Procedure: GIVENS CAPSULE STUDY;  Surgeon: Daneil Dolin, MD;  Location: [facility];  Service: Endoscopy;  Laterality: N/A;   KNEE SURGERY  right knee   LAPAROTOMY N/A 02/01/2017   Procedure: EXPLORATORY LAPAROTOMY;  Surgeon: Aviva Signs, MD;  Location: [facility];  Service: General;  Laterality: N/A;   POLYPECTOMY  01/26/2017   Procedure: POLYPECTOMY;  Surgeon: Daneil Dolin, MD;  Location: [facility];  Service: Endoscopy;;  sigmoid and rectal    Assessment & Plan Clinical Impression: Brandi [name] is a 66 year old right-handed female with history of parasitic anemia, diabetes mellitus hypertension, history of CVA  maintained on aspirin, hyperlipidemia, quit smoking 10 years ago.  Per chart review patient lives with children.  1 level home 10 steps to entry.  Independent with straight point cane.  Presented 11/20/2021 with lower extremity weakness progressive paraplegia to [facility] as well as tingling in left foot x2 weeks as well as gait instability.Marland Kitchen  CT/MRI of the brain chronic left temporoparietal encephalomalacia no evidence of acute abnormality.  MRA of the head negative. 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  MRI thoracic spine showed T3-4, T4-5 disc bulge with osteophytic ridging with moderate to severe canal stenosis.  Neurosurgical /neurology follow-up no surgical intervention placed on steroid taper.  Scans reviewed by neurosurgery for etiology of her weakness and did not believe it was from her cervical disease she had no upper extremity involvement.  Suspect myelopathy with upper thoracic sensory level   It was felt that transverse myelitis relatively unlikely.  Awaiting plan for possible myelogram.  Patient was cleared to begin subcutaneous heparin for DVT prophylaxis.  Bouts of acute urinary retention Foley tube removed currently maintained on Flomax.  Chronic anemia hemoglobin 9.5 and monitored.  Therapy evaluations completed due to patient decreased functional mobility was admitted for a comprehensive rehab program.  Patient transferred to CIR on 11/30/2021 .    Patient currently requires max with basic self-care skills secondary to muscle weakness, decreased cardiorespiratoy endurance, decreased coordination, decreased problem solving, and decreased standing balance, decreased postural control, hemiplegia, and decreased balance strategies.  Prior to hospitalization, patient could complete ADLs with  modified independent .  Patient will benefit from skilled intervention to decrease level of assist with basic self-care skills prior to  discharge home with care partner.  Anticipate patient will require 24 hour supervision and minimal physical assistance and follow up home health.  OT - End of Session Activity Tolerance: Tolerates 10 - 20 min activity with multiple rests Endurance Deficit: Yes Endurance Deficit Description: generalized weakness/deconditioning OT Assessment Rehab Potential (ACUTE ONLY): Good OT Barriers to Discharge: Inaccessible home environment OT Barriers to Discharge Comments: 10 stairs to enter, unsure if she can go in the back OT Patient demonstrates impairments in the following area(s): Balance;Safety;Sensory;Cognition;Endurance;Motor;Skin Integrity OT Basic ADL's Functional Problem(s): Bathing;Dressing;Toileting OT Transfers Functional Problem(s): Toilet;Tub/Shower OT Additional Impairment(s): None OT Plan OT Intensity: Minimum of 1-2 x/day, 45 to 90 minutes OT Frequency: 5 out of 7 days OT Duration/Estimated Length of Stay: 3 weeks OT Treatment/Interventions: Discharge planning;Balance/vestibular training;Pain management;Self Care/advanced ADL retraining;Therapeutic Activities;UE/LE Coordination activities;Therapeutic Exercise;Skin care/wound managment;Functional mobility training;Patient/family education;Wheelchair propulsion/positioning;UE/LE Strength taining/ROM;Splinting/orthotics;Disease mangement/prevention;Community reintegration;DME/adaptive equipment instruction;Neuromuscular re-education OT Self Feeding Anticipated Outcome(s): no goal set OT Basic Self-Care Anticipated Outcome(s): min A OT Toileting Anticipated Outcome(s): min A OT Bathroom Transfers Anticipated Outcome(s): min A OT Recommendation Patient destination: Home Follow Up Recommendations: Home health OT Equipment Recommended: To be determined   OT Evaluation Precautions/Restrictions  Precautions Precautions: Fall Restrictions Weight Bearing Restrictions: No General Chart Reviewed: Yes Family/Caregiver Present:  No  Pain Pain Assessment Pain Scale: 0-10 Pain Score: 6  Faces Pain Scale: Hurts a little bit Pain Type: Acute pain Pain Location: Knee Pain Orientation: Left Pain Descriptors / Indicators: Aching Pain Onset: On-going Pain Intervention(s): Medication (See eMAR) PAINAD (Pain Assessment in Advanced Dementia) Breathing: normal Negative Vocalization: none Body Language: relaxed Consolability: no need to console Home Living/Prior Hope expects to be discharged to:: Private residence Living Arrangements: Children Available Help at Discharge: Family, Available 24 hours/day Type of Home: House Home Access: Stairs to enter [employer] of Steps: 10- reports only 2 in the back Entrance Stairs-Rails: Right Home Layout: One level Bathroom Shower/Tub: Government social research officer Accessibility: Yes Additional Comments: son lives with pt  Lives With: Son IADL History Homemaking Responsibilities: Yes Meal Prep Responsibility: Primary Laundry Responsibility: Primary Cleaning Responsibility: Primary Bill Paying/Finance Responsibility: Secondary Shopping Responsibility: Primary Current License: No Occupation: Retired Prior Function Level of Independence: Independent with basic ADLs, Needs assistance with gait, Independent with homemaking with ambulation (cane in the community) Driving: Yes Vocation: Retired Leisure: Hobbies-yes (Comment) (church, visiting friends) Vision Baseline Vision/History: 0 No visual deficits Ability to See in Adequate Light: 0 Adequate Patient Visual Report: No change from baseline Vision Assessment?: No apparent visual deficits Perception  Perception: Within Functional Limits Praxis Praxis: Intact Cognition Overall Cognitive Status: History of cognitive impairments - at baseline Arousal/Alertness: Awake/alert Orientation Level: Person;Place;Situation Person: Oriented Place:  Oriented Situation: Oriented Year: 2023 Month: February Day of Week: Correct Memory: Appears intact Immediate Memory Recall: Sock;Blue;Bed Memory Recall Sock: Without Cue Memory Recall Blue: Without Cue Memory Recall Bed: Without Cue Attention: Selective Selective Attention: Appears intact Awareness: Appears intact Problem Solving: Appears intact Safety/Judgment: Appears intact Comments: 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 Pt with slow processing and sequencing at times Sensation Sensation Light Touch: Appears Intact Central sensation comments: Residual R diminished sensation Light Touch Impaired Details: Impaired RUE;Impaired RLE Hot/Cold: Appears Intact Coordination Gross Motor Movements are Fluid and Coordinated: No Fine Motor Movements are Fluid and Coordinated: No Coordination and Movement  Description: Residual R hemi and BLE weakness Finger Nose Finger Test: Coordination deficits on the RUE Motor  Motor Motor: Hemiplegia;Other (comment) Motor - Skilled Clinical Observations: residual R weakness (mild) and BLE weakness with generalized deconditioning  Trunk/Postural Assessment  Cervical Assessment Cervical Assessment: Within Functional Limits Thoracic Assessment Thoracic Assessment: Exceptions to [facility] (rounded shoulders) Lumbar Assessment Lumbar Assessment: Exceptions to [facility] (posterior pelvic tilt) Postural Control Postural Control: Deficits on evaluation Righting Reactions: delayed Protective Responses: delayed  Balance Balance Balance Assessed: Yes Static Sitting Balance Static Sitting - Balance Support: Feet supported Static Sitting - Level of Assistance: 5: Stand by assistance Dynamic Sitting Balance Dynamic Sitting - Balance Support: During functional activity;Feet supported Dynamic Sitting - Level of Assistance: 5: Stand by assistance Extremity/Trunk Assessment RUE Assessment RUE Assessment: Exceptions to [facility] Active Range of  Motion (AROM) Comments: Full AROM and normal strength, some residual coordination deficits and stiffness LUE Assessment LUE Assessment: Within Functional Limits  Care Tool Care Tool Self Care Eating   Eating Assist Level: Set up assist    Oral Care    Oral Care Assist Level: Supervision/Verbal cueing    Bathing   Body parts bathed by patient: Right arm;Left arm;Chest;Abdomen;Front perineal area;Right upper leg;Left upper leg;Face Body parts bathed by helper: Buttocks;Right lower leg;Left lower leg   Assist Level: Maximal Assistance - Patient 24 - 49% (bed level and EOB)    Upper Body Dressing(including orthotics)   What is the patient wearing?: Pull over shirt   Assist Level: Moderate Assistance - Patient 50 - 74%    Lower Body Dressing (excluding footwear)   What is the patient wearing?: Underwear/pull up Assist for lower body dressing: 2 Helpers    Putting on/Taking off footwear   What is the patient wearing?: Non-skid slipper socks Assist for footwear: Dependent - Patient 0%       Care Tool Toileting Toileting activity Toileting Activity did not occur (Clothing management and hygiene only): N/A (no void or bm)       Care Tool Bed Mobility Roll left and right activity   Roll left and right assist level: Minimal Assistance - Patient > 75%    Sit to lying activity   Sit to lying assist level: Minimal Assistance - Patient > 75%    Lying to sitting on side of bed activity   Lying to sitting on side of bed assist level: the ability to move from lying on the back to sitting on the side of the bed with no back support.: Minimal Assistance - Patient > 75%     Care Tool Transfers Sit to stand transfer   Sit to stand assist level: 2 Helpers    Chair/bed transfer   Chair/bed transfer assist level: 2 Pension scheme manager transfer   Assist Level: 2 Helpers     Care Tool Cognition  Expression of Ideas and Wants Expression of Ideas and Wants: 3. Some difficulty -  exhibits some difficulty with expressing needs and ideas (e.g, some words or finishing thoughts) or speech is not clear (baseline word finding deficits)  Understanding Verbal and Non-Verbal Content Understanding Verbal and Non-Verbal Content: 3. Usually understands - understands most conversations, but misses some part/intent of message. Requires cues at times to understand   Memory/Recall Ability Memory/Recall Ability : Current season;That he or she is in a hospital/hospital unit   Refer to Care Plan for [place] 1 OT Short Term Goal 1 (Week 1): Pt will  don UB clothing with CGA OT Short Term Goal 2 (Week 1): Pt will complete sit > stand with max A of 1 using LRAD OT Short Term Goal 3 (Week 1): Pt will complete bed mobility to EOB with CGA  Recommendations for other services: None    Skilled Therapeutic Intervention ADL ADL Eating: Supervision/safety Where Assessed-Eating: Bed level Grooming: Supervision/safety Where Assessed-Grooming: Edge of bed Upper Body Bathing: Minimal assistance Where Assessed-Upper Body Bathing: Edge of bed Lower Body Bathing: Moderate assistance Where Assessed-Lower Body Bathing: Bed level Upper Body Dressing: Minimal cueing;Moderate assistance Where Assessed-Upper Body Dressing: Edge of bed Lower Body Dressing: Dependent Where Assessed-Lower Body Dressing: Bed level Toileting: Unable to assess Toilet Transfer: Unable to assess Mobility  Bed Mobility Bed Mobility: Sit to Supine;Supine to Sit;Rolling Right;Rolling Left Rolling Right: Minimal Assistance - Patient > 75% Rolling Left: Minimal Assistance - Patient > 75% Supine to Sit: Minimal Assistance - Patient > 75% Sit to Supine: Moderate Assistance - Patient 50-74% Transfers Sit to Stand: 2 Helpers Stand to Sit: 2 Helpers   Skilled OT evaluation completed. Education provided re rehab expectations, OT POC, fall reduction policy, and DME/home recommendations. ADLs  performed EOB as described above. Attempted sit > stand and pt was able to activate BLE and initiate lift but also slide forward each attempt significantly. No +2 available and OT determined it unsafe to continue trying. Per acute notes and clinical judgement, stedy +2 assist will likely be safest transfer at this time. Pt completed LB bathing and dressing supine. Pt left supine with ultrasound tech entering room for LE doppler.   Discharge Criteria: Patient will be discharged from OT if patient refuses treatment 3 consecutive times without medical reason, if treatment goals not met, if there is a change in medical status, if patient makes no progress towards goals or if patient is discharged from hospital.  The above assessment, treatment plan, treatment alternatives and goals were discussed and mutually agreed upon: by patient  Curtis Sites 12/01/2021, 12:38 PM

## 2021-12-01 NOTE — Plan of Care (Signed)
°  Problem: RH Balance Goal: LTG Patient will maintain dynamic sitting balance (PT) Description: LTG:  Patient will maintain dynamic sitting balance with assistance during mobility activities (PT) Flowsheets (Taken 12/01/2021 2327) LTG: Pt will maintain dynamic sitting balance during mobility activities with:: Supervision/Verbal cueing   Problem: Sit to Stand Goal: LTG:  Patient will perform sit to stand with assistance level (PT) Description: LTG:  Patient will perform sit to stand with assistance level (PT) Flowsheets (Taken 12/01/2021 2327) LTG: PT will perform sit to stand in preparation for functional mobility with assistance level: Moderate Assistance - Patient 50 - 74%   Problem: RH Bed Mobility Goal: LTG Patient will perform bed mobility with assist (PT) Description: LTG: Patient will perform bed mobility with assistance, with/without cues (PT). Flowsheets (Taken 12/01/2021 2327) LTG: Pt will perform bed mobility with assistance level of: Supervision/Verbal cueing   Problem: RH Bed to Chair Transfers Goal: LTG Patient will perform bed/chair transfers w/assist (PT) Description: LTG: Patient will perform bed to chair transfers with assistance (PT). Flowsheets (Taken 12/01/2021 2327) LTG: Pt will perform Bed to Chair Transfers with assistance level: Minimal Assistance - Patient > 75%   Problem: RH Car Transfers Goal: LTG Patient will perform car transfers with assist (PT) Description: LTG: Patient will perform car transfers with assistance (PT). Flowsheets (Taken 12/01/2021 2327) LTG: Pt will perform car transfers with assist:: Moderate Assistance - Patient 50 - 74%   Problem: RH Wheelchair Mobility Goal: LTG Patient will propel w/c in controlled environment (PT) Description: LTG: Patient will propel wheelchair in controlled environment, # of feet with assist (PT) Flowsheets (Taken 12/01/2021 2327) LTG: Pt will propel w/c in controlled environ  assist needed:: Supervision/Verbal  cueing LTG: Propel w/c distance in controlled environment: 142ft Goal: LTG Patient will propel w/c in home environment (PT) Description: LTG: Patient will propel wheelchair in home environment, # of feet with assistance (PT). Flowsheets (Taken 12/01/2021 2327) LTG: Pt will propel w/c in home environ  assist needed:: Supervision/Verbal cueing LTG: Propel w/c distance in home environment: 51ft

## 2021-12-01 NOTE — Progress Notes (Signed)
Inpatient Rehabilitation  Patient information reviewed and entered into eRehab system by Aalayah Riles M. Mehul Rudin, M.A., CCC/SLP, PPS Coordinator.  Information including medical coding, functional ability and quality indicators will be reviewed and updated through discharge.    

## 2021-12-02 DIAGNOSIS — G959 Disease of spinal cord, unspecified: Secondary | ICD-10-CM | POA: Diagnosis not present

## 2021-12-02 LAB — GLUCOSE, CAPILLARY
Glucose-Capillary: 81 mg/dL (ref 70–99)
Glucose-Capillary: 138 mg/dL — ABNORMAL HIGH (ref 70–99)
Glucose-Capillary: 130 mg/dL — ABNORMAL HIGH (ref 70–99)
Glucose-Capillary: 116 mg/dL — ABNORMAL HIGH (ref 70–99)

## 2021-12-02 MED ORDER — DICLOFENAC SODIUM 1 % EX GEL
2.0000 g | Freq: Four times a day (QID) | CUTANEOUS | Status: DC
  Administered 2021-12-02 – 2021-12-26 (×94): 2 g via TOPICAL
  Filled 2021-12-02 (×4): qty 100

## 2021-12-02 NOTE — IPOC Note (Signed)
Overall Plan of Care [facility]) Patient Details Name: Brandi [name] MRN: [id number] DOB: May 21, 1956  Admitting Diagnosis: Myelopathy [facility])  Hospital Problems: Principal Problem:   Myelopathy ([place])     Functional Problem List: Nursing Bladder, Bowel, Endurance, Motor, Pain, Safety, Sensory  PT Balance, Edema, Endurance, Motor, Pain, Safety, Sensory  OT Balance, Safety, Sensory, Cognition, Endurance, Motor, Skin Integrity  SLP    TR         Basic ADLs: OT Bathing, Dressing, Toileting     Advanced  ADLs: OT       Transfers: PT Bed Mobility, Bed to Chair, Car, Manufacturing systems engineer, Metallurgist: PT Emergency planning/management officer, Ambulation, Stairs     Additional Impairments: OT None  SLP        TR      Anticipated Outcomes Item Anticipated Outcome  Self Feeding no goal set  Swallowing      Basic self-care  min A  Toileting  min A   Bathroom Transfers min A  Bowel/Bladder  supervision  Transfers  min assist with LRAD to and from [facility]  Locomotion  supervision assist WC level  Communication     Cognition     Pain  < 3  Safety/Judgment  supervision   Therapy Plan: PT Intensity: Minimum of 1-2 x/day ,45 to 90 minutes PT Frequency: 5 out of 7 days PT Duration Estimated Length of Stay: 21-25 days OT Intensity: Minimum of 1-2 x/day, 45 to 90 minutes OT Frequency: 5 out of 7 days OT Duration/Estimated Length of Stay: 3 weeks     Due to the current state of emergency, patients may not be receiving their 3-hours of Medicare-mandated therapy.   Team Interventions: Nursing Interventions Patient/Family Education, Bladder Management, Bowel Management, Disease Management/Prevention, Pain Management, Discharge Planning  PT interventions Ambulation/gait training, Discharge planning, Functional mobility training, Psychosocial support, Therapeutic Activities, Visual/perceptual remediation/compensation, Balance/vestibular training, Disease  management/prevention, Neuromuscular re-education, Skin care/wound management, Therapeutic Exercise, Wheelchair propulsion/positioning, Cognitive remediation/compensation, DME/adaptive equipment instruction, Pain management, Splinting/orthotics, Community reintegration, Technical sales engineer stimulation, Patient/family education, IT trainer, UE/LE Coordination activities, UE/LE Strength taining/ROM  OT Interventions Discharge planning, Training and development officer, Pain management, Self Care/advanced ADL retraining, Therapeutic Activities, UE/LE Coordination activities, Therapeutic Exercise, Skin care/wound managment, Functional mobility training, Patient/family education, Wheelchair propulsion/positioning, UE/LE Strength taining/ROM, Splinting/orthotics, Disease mangement/prevention, Academic librarian, Engineer, drilling, Neuromuscular re-education  SLP Interventions    TR Interventions    SW/CM Interventions Discharge Planning, Psychosocial Support, Patient/Family Education   Barriers to Discharge MD  Medical stability and Weight  Nursing Decreased caregiver support, Home environment access/layout, Incontinence, Lack of/limited family support, Weight, Weight bearing restrictions 1 level, 10 steps front, 2 steps back, right rail. Son doesn't work, can provide assist at discharge.  PT Inaccessible home environment, Decreased caregiver support, Home environment access/layout, Lack of/limited family support, Insurance for SNF coverage, Weight    OT Inaccessible home environment 10 stairs to enter, unsure if she can go in the back  SLP      SW Decreased caregiver support, Lack of/limited family support, Insurance underwriter for SNF coverage     Team Discharge Planning: Destination: PT-Home ,OT- Home , SLP-  Projected Follow-up: PT- , OT-  Home health OT, SLP-  Projected Equipment Needs: PT-Wheelchair (measurements), Wheelchair cushion (measurements), To be determined, OT- To be  determined, SLP-  Equipment Details: PT- , OT-  Patient/family involved in discharge planning: PT- Patient,  OT-Patient, SLP-   MD ELOS: 21-25d Medical Rehab Prognosis:  Good Assessment: 66 year old  right-handed female with history of parasitic anemia, diabetes mellitus hypertension, history of CVA maintained on aspirin, hyperlipidemia, quit smoking 10 years ago.  Per chart review patient lives with children.  1 level home 10 steps to entry.  Independent with straight point cane.  Presented 11/20/2021 with lower extremity weakness progressive paraplegia to [facility] as well as tingling in left foot x2 weeks as well as gait instability.Marland Kitchen  CT/MRI of the brain chronic left temporoparietal encephalomalacia no evidence of acute abnormality.  MRA of the head negative. 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  MRI thoracic spine showed T3-4, T4-5 disc bulge with osteophytic ridging with moderate to severe canal stenosis.  Neurosurgical /neurology follow-up no surgical intervention placed on steroid taper.  Scans reviewed by neurosurgery for etiology of her weakness and did not believe it was from her cervical disease she had no upper extremity involvement.  Suspect myelopathy with upper thoracic sensory level   It was felt that transverse myelitis relatively unlikely.  Awaiting plan for possible myelogram.  Patient was cleared to begin subcutaneous heparin for DVT prophylaxis.  Bouts of acute urinary retention Foley tube removed currently maintained on Flomax.  Chronic anemia hemoglobin 9.5 and monitored.  Therapy evaluations completed due to patient decreased functional mobility was admitted for a comprehensive rehab program.    See Team Conference Notes for weekly updates to the plan of care

## 2021-12-02 NOTE — Progress Notes (Signed)
PROGRESS NOTE   Subjective/Complaints:  No issues overnite   Objective:   VAS [place] LOWER EXTREMITY VENOUS (DVT)  Result Date: 12/01/2021  Lower Venous DVT Study Patient Name:  Brandi [name]  Date of Exam:   12/01/2021 Medical Rec #: [id number]          Accession #:    [id number] Date of Birth: [birth year]          Patient Gender: F Patient Age:   66 years Exam Location:  [facility] Procedure:      VAS [place] LOWER EXTREMITY VENOUS (DVT) Referring Phys: Lauraine Rinne --------------------------------------------------------------------------------  Indications: Swelling, and Edema.  Comparison Study: no prior Performing Technologist: Archie Patten RVS  Examination Guidelines: A complete evaluation includes B-mode imaging, spectral Doppler, color Doppler, and power Doppler as needed of all accessible portions of each vessel. Bilateral testing is considered an integral part of a complete examination. Limited examinations for reoccurring indications may be performed as noted. The reflux portion of the exam is performed with the patient in reverse Trendelenburg.  +---------+---------------+---------+-----------+----------+--------------+  RIGHT     Compressibility Phasicity Spontaneity Properties Thrombus Aging  +---------+---------------+---------+-----------+----------+--------------+  CFV       Full            Yes       Yes                                    +---------+---------------+---------+-----------+----------+--------------+  SFJ       Full                                                             +---------+---------------+---------+-----------+----------+--------------+  FV Prox   Full                                                             +---------+---------------+---------+-----------+----------+--------------+  FV Mid    Full                                                              +---------+---------------+---------+-----------+----------+--------------+  FV Distal Full                                                             +---------+---------------+---------+-----------+----------+--------------+  PFV       Full                                                             +---------+---------------+---------+-----------+----------+--------------+  POP       Full            Yes       Yes                                    +---------+---------------+---------+-----------+----------+--------------+  PTV       Full                                                             +---------+---------------+---------+-----------+----------+--------------+  PERO      Full                                                             +---------+---------------+---------+-----------+----------+--------------+   +---------+---------------+---------+-----------+----------+--------------+  LEFT      Compressibility Phasicity Spontaneity Properties Thrombus Aging  +---------+---------------+---------+-----------+----------+--------------+  CFV       Full            Yes       Yes                                    +---------+---------------+---------+-----------+----------+--------------+  SFJ       Full                                                             +---------+---------------+---------+-----------+----------+--------------+  FV Prox   Full                                                             +---------+---------------+---------+-----------+----------+--------------+  FV Mid    Full                                                             +---------+---------------+---------+-----------+----------+--------------+  FV Distal Full                                                             +---------+---------------+---------+-----------+----------+--------------+  PFV       Full                                                              +---------+---------------+---------+-----------+----------+--------------+  POP       Full            Yes       Yes                                    +---------+---------------+---------+-----------+----------+--------------+  PTV       Full                                                             +---------+---------------+---------+-----------+----------+--------------+  PERO      Full                                                             +---------+---------------+---------+-----------+----------+--------------+     Summary: BILATERAL: - No evidence of deep vein thrombosis seen in the lower extremities, bilaterally. -No evidence of popliteal cyst, bilaterally.   *See table(s) above for measurements and observations. Electronically signed by Orlie Pollen on 12/01/2021 at 6:40:43 PM.    Final    Recent Labs    11/30/21 0300 12/01/21 0549  WBC 12.8* 9.6  HGB 10.5* 11.1*  HCT 36.0 37.3  PLT 431* 420*   Recent Labs    11/30/21 0300 12/01/21 0549  NA 136 135  K 4.5 4.6  CL 103 102  CO2 23 25  GLUCOSE 94 96  BUN 28* 27*  CREATININE 1.10* 1.19*  CALCIUM 8.8* 9.0    Intake/Output Summary (Last 24 hours) at 12/02/2021 0732 Last data filed at 12/01/2021 1900 Gross per 24 hour  Intake 495 ml  Output --  Net 495 ml        Physical Exam: Vital Signs Blood pressure (!) 113/59, pulse (!) 51, temperature 98.8 F (37.1 C), resp. rate 16, height 5\' 6"  (1.676 m), weight 126 kg, SpO2 99 %.  General: No acute distress Mood and affect are appropriate Heart: Regular rate and rhythm no rubs murmurs or extra sounds Lungs: Clear to auscultation, breathing unlabored, no rales or wheezes Abdomen: Positive bowel sounds, soft nontender to palpation, nondistended Extremities: No clubbing, cyanosis, or edema Skin: No evidence of breakdown, no evidence of rash Neurologic: Cranial nerves II through XII intact, motor strength is /5 in bilateral deltoid, bicep, tricep, grip, 2- bilateral hip  flexor, knee extensors, ankle dorsiflexor and plantar flexor Sensory exam parasthesia to LT in BLE Cerebellar exam normal finger to nose to finger as well as heel to shin in bilateral upper and lower extremities Musculoskeletal: Pain with Bilateral knee ROM, no knee effusion or tenderness     Assessment/Plan: 1. Functional deficits which require 3+ hours per day of interdisciplinary therapy in a comprehensive inpatient rehab setting. Physiatrist is providing close team supervision and 24 hour management of active medical problems listed below. Physiatrist and rehab team continue to assess barriers to discharge/monitor patient progress toward functional and medical goals  Care Tool:  Bathing    Body parts bathed by patient: Right arm, Left arm, Chest, Abdomen, Front perineal area, Right upper leg, Left upper leg, Face   Body  parts bathed by helper: Buttocks, Right lower leg, Left lower leg     Bathing assist Assist Level: Maximal Assistance - Patient 24 - 49%     Upper Body Dressing/Undressing Upper body dressing   What is the patient wearing?: Pull over shirt    Upper body assist Assist Level: Moderate Assistance - Patient 50 - 74%    Lower Body Dressing/Undressing Lower body dressing      What is the patient wearing?: Underwear/pull up     Lower body assist Assist for lower body dressing: 2 Helpers     Toileting Toileting Toileting Activity did not occur (Clothing management and hygiene only): N/A (no void or bm)  Toileting assist Assist for toileting: Total Assistance - Patient < 25%     Transfers Chair/bed transfer  Transfers assist     Chair/bed transfer assist level: 2 Helpers     Locomotion Ambulation   Ambulation assist   Ambulation activity did not occur: Safety/medical concerns          Walk 10 feet activity   Assist  Walk 10 feet activity did not occur: Safety/medical concerns        Walk 50 feet activity   Assist Walk 50 feet  with 2 turns activity did not occur: Safety/medical concerns         Walk 150 feet activity   Assist Walk 150 feet activity did not occur: Safety/medical concerns         Walk 10 feet on uneven surface  activity   Assist Walk 10 feet on uneven surfaces activity did not occur: Safety/medical concerns         Wheelchair     Assist Is the patient using a wheelchair?: Yes Type of Wheelchair: Manual    Wheelchair assist level: Moderate Assistance - Patient 50 - 74% Max wheelchair distance: 150    Wheelchair 50 feet with 2 turns activity    Assist        Assist Level: Moderate Assistance - Patient 50 - 74%   Wheelchair 150 feet activity     Assist      Assist Level: Moderate Assistance - Patient 50 - 74%   Blood pressure (!) 113/59, pulse (!) 51, temperature 98.8 F (37.1 C), resp. rate 16, height 5\' 6"  (1.676 m), weight 126 kg, SpO2 99 %.  Medical Problem List and Plan: 1. 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 - Decadron taper             -patient may shower             -ELOS/Goals: 10-12 days, supervision goals with PT and OT 2.  Antithrombotics: -DVT/anticoagulation:  Pharmaceutical: Heparin.  Check vascular study             -antiplatelet therapy: Aspirin 325 mg daily 3. 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 Mood: Trazodone 150 mg nightly, Xanax 0.25 mg twice daily as needed             -antipsychotic agents: N/A 5. Neuropsych: This patient is capable of making decisions on his own behalf. 6. Skin/Wound Care: Routine skin checks             -encourage appropriate nutrition              -pressure relief 7. Fluids/Electrolytes/Nutrition:  I personally reviewed the patient's labs today.               -add protein supp for low albumin 8.  Diabetes mellitus.  Hemoglobin A1c 6.0.                CBG (last 3)  Recent Labs  (last 2 labs)        Recent Labs    11/30/21 1645 11/30/21 2154 12/01/21 0601  GLUCAP 154* 130* 97          controlled 2/18 9.  Hypertension.  Lopressor 25 mg twice daily, hydralazine 25 mg every 8 hours.    2/18 bp well controlled Vitals:   12/01/21 2045 12/02/21 0520  BP:  (!) 113/59  Pulse: (!) 53 (!) 51  Resp:  16  Temp:  98.8 F (37.1 C)  SpO2:  99%    10.  Hyperlipidemia.  Zocor 11.  Microcytic anemia.  Continue iron supplement.  Follow-up CBC 12.  Acute urinary retention/neurogenic bladder.  Foley tube removed.   -Presently on Flomax 0.4 mg.   -need pvr's 13.  Constipation/neurogenic bowel.   -Senokot twice daily, MiraLAX daily 2/17-had small, hard bm yesterday and minimal before             Give sorbitol today             -fleet enema if needed   14.  Obesity.  BMI 39.5.  Dietary follow-up 15.  History of CVA.  Modified independent with straight point cane prior to admission.  Continue aspirin 16.  Leukocytosis.  Suspect steroid-induced--improved to 9.6 2/17    LOS: 2 days A FACE TO FACE EVALUATION WAS PERFORMED  Charlett Blake 12/02/2021, 7:32 AM

## 2021-12-03 DIAGNOSIS — G959 Disease of spinal cord, unspecified: Secondary | ICD-10-CM | POA: Diagnosis not present

## 2021-12-03 LAB — GLUCOSE, CAPILLARY
Glucose-Capillary: 106 mg/dL — ABNORMAL HIGH (ref 70–99)
Glucose-Capillary: 108 mg/dL — ABNORMAL HIGH (ref 70–99)
Glucose-Capillary: 107 mg/dL — ABNORMAL HIGH (ref 70–99)
Glucose-Capillary: 144 mg/dL — ABNORMAL HIGH (ref 70–99)

## 2021-12-03 NOTE — Progress Notes (Signed)
Physical Therapy Session Note  Patient Details  Name: Brandi [name] MRN: [id number] Date of Birth: 09/01/1956  Today's Date: 12/03/2021 PT Individual Time: 0900-1000 PT Individual Time Calculation (min): 60 min   Short Term Goals: Week 1:  PT Short Term Goal 1 (Week 1): Pt will transfer to [facility] with Slide board and max assist PT Short Term Goal 2 (Week 1): Pt will performed sit<>stand in parallel bars with max assist +2 PT Short Term Goal 3 (Week 1): Pt will propel WC 130ft with supervision assist PT Short Term Goal 4 (Week 1): pt will tolerate sitting up in WC >2 hours between therapies in WC PT Short Term Goal 5 - Progress (Week 1): Revised due to lack of progress  Skilled Therapeutic Interventions/Progress Updates:    Pt received supine in bed, agreeable to PT session. Pt reports pain in B knees at rest, not rated and reports being premedicated prior to start of therapy session. Pt requesting to don pants prior to getting OOB this AM. Rolling L/R with mod to max A for dependent donning of pants. Supine to sit with max A for trunk elevation and BLE management. Pt is able to maintain sitting balance EOB with close Supervision. Attempt sit to stand from elevated bed to stedy, pt able to lift buttocks up from bed but unable to fully stand due to LE pain and weakness. Pt agreeable to attempt slide board transfer this session. Slide board transfer bed to w/c initially with mod A, however pt begins to slide forwards off of slide board during transfer. Pt able to recover balance with total A, however she then loses balance laterally and ends up in R lateral lean in w/c with buttocks in chair. Pt becomes anxious and fearful of falling, distress button activated. With total A x 2 pt able to return to sitting upright position and complete slide board transfer safely to w/c. Manual w/c propulsion up to 50 ft with use of BUE at min A level. Pt exhibits difficulty grasping with RUE due to residual deficits  from prior CVA. Added theraband to R rim for improved grip, pt continues to require min A for w/c propulsion due to strength and sensory deficits in R hand. Pt agreeable to remain seated in w/c at end of session, needs in reach.  Therapy Documentation Precautions:  Precautions Precautions: Fall Restrictions Weight Bearing Restrictions: No      Therapy/Group: Individual Therapy   Excell Seltzer, PT, DPT, CSRS  12/03/2021, 10:59 AM

## 2021-12-03 NOTE — Progress Notes (Signed)
Occupational Therapy Session Note  Patient Details  Name: Brandi [name] MRN: [id number] Date of Birth: 1956/09/10  Today's Date: 12/03/2021 OT Individual Time: 1330-1425 OT Individual Time Calculation (min): 55 min    Short Term Goals: Week 1:  OT Short Term Goal 1 (Week 1): Pt will don UB clothing with CGA OT Short Term Goal 2 (Week 1): Pt will complete sit > stand with max A of 1 using LRAD OT Short Term Goal 3 (Week 1): Pt will complete bed mobility to EOB with CGA  Skilled Therapeutic Interventions/Progress Updates:    Pt resting in bed upon arrival. Initial focus on BUE therex with 3# bar and green theraband. Pt completed UB exercises 12x3 with rest. Pt requested to use bed pan. Bed mobility with mod A+2 for rolling R/L and tot A for clothing mgmt. Pt unsuccessful. Pt completed hygiene without assistance but dependent for clothing mgmt. After clothing rearranged, pt requested use of bed pan again. Bed mobility with mod A+1 this time. Pt remained on bed pan. RN notified. Bed alarm activated.  Therapy Documentation Precautions:  Precautions Precautions: Fall Restrictions Weight Bearing Restrictions: No  Pain:  Pt c/o Bil knee pain R>L; repositoined and PROM   Therapy/Group: Individual Therapy  [name] 12/03/2021, 2:25 PM

## 2021-12-03 NOTE — Progress Notes (Signed)
Occupational Therapy Session Note  Patient Details  Name: LEANNAH GUSE MRN: [id number] Date of Birth: 30-Dec-1955  Today's Date: 12/03/2021 OT Individual Time: 0700-0810 OT Individual Time Calculation (min): 70 min    Short Term Goals: Week 1:  OT Short Term Goal 1 (Week 1): Pt will don UB clothing with CGA OT Short Term Goal 2 (Week 1): Pt will complete sit > stand with max A of 1 using LRAD OT Short Term Goal 3 (Week 1): Pt will complete bed mobility to EOB with CGA  Skilled Therapeutic Interventions/Progress Updates:    Pt resting in bed upon arrival. Pt states "nursing already washed my legs." Initial OT intervention with focus on UB bathing and donning night gown including bed mobility. Supine>sit EOB with max A. Pt required min A for sitting balance. Did not attempt sit<>stand with Stedy this morning. Discussed discharge plans and DME requirements. Obtained scrub pants to don later. Bariatric Stedy located and placed in bathroom.   Therapy Documentation Precautions:  Precautions Precautions: Fall Restrictions Weight Bearing Restrictions: No Pain:  Pt reports Bil knee pain but improved from previous day; emotional support   Therapy/Group: Individual Therapy  [name] 12/03/2021, 8:12 AM

## 2021-12-03 NOTE — Progress Notes (Signed)
Attempted to void X 104minutes. Bladder scan=466cc's. I & O cath=550cc's. Brandi [name]

## 2021-12-03 NOTE — Progress Notes (Signed)
PROGRESS NOTE   Subjective/Complaints:  No issues overnite , still with R>L knee pain ROS- neg CP, SOB, N/V/D Objective:   VAS [place] LOWER EXTREMITY VENOUS (DVT)  Result Date: 12/01/2021  Lower Venous DVT Study Patient Name:  Brandi [name]  Date of Exam:   12/01/2021 Medical Rec #: [id number]          Accession #:    [id number] Date of Birth: 10-02-1956          Patient Gender: F Patient Age:   66 years Exam Location:  [facility] Procedure:      VAS [place] LOWER EXTREMITY VENOUS (DVT) Referring Phys: Lauraine Rinne --------------------------------------------------------------------------------  Indications: Swelling, and Edema.  Comparison Study: no prior Performing Technologist: Archie Patten RVS  Examination Guidelines: A complete evaluation includes B-mode imaging, spectral Doppler, color Doppler, and power Doppler as needed of all accessible portions of each vessel. Bilateral testing is considered an integral part of a complete examination. Limited examinations for reoccurring indications may be performed as noted. The reflux portion of the exam is performed with the patient in reverse Trendelenburg.  +---------+---------------+---------+-----------+----------+--------------+  RIGHT     Compressibility Phasicity Spontaneity Properties Thrombus Aging  +---------+---------------+---------+-----------+----------+--------------+  CFV       Full            Yes       Yes                                    +---------+---------------+---------+-----------+----------+--------------+  SFJ       Full                                                             +---------+---------------+---------+-----------+----------+--------------+  FV Prox   Full                                                             +---------+---------------+---------+-----------+----------+--------------+  FV Mid    Full                                                              +---------+---------------+---------+-----------+----------+--------------+  FV Distal Full                                                             +---------+---------------+---------+-----------+----------+--------------+  PFV  Full                                                             +---------+---------------+---------+-----------+----------+--------------+  POP       Full            Yes       Yes                                    +---------+---------------+---------+-----------+----------+--------------+  PTV       Full                                                             +---------+---------------+---------+-----------+----------+--------------+  PERO      Full                                                             +---------+---------------+---------+-----------+----------+--------------+   +---------+---------------+---------+-----------+----------+--------------+  LEFT      Compressibility Phasicity Spontaneity Properties Thrombus Aging  +---------+---------------+---------+-----------+----------+--------------+  CFV       Full            Yes       Yes                                    +---------+---------------+---------+-----------+----------+--------------+  SFJ       Full                                                             +---------+---------------+---------+-----------+----------+--------------+  FV Prox   Full                                                             +---------+---------------+---------+-----------+----------+--------------+  FV Mid    Full                                                             +---------+---------------+---------+-----------+----------+--------------+  FV Distal Full                                                             +---------+---------------+---------+-----------+----------+--------------+  PFV       Full                                                              +---------+---------------+---------+-----------+----------+--------------+  POP       Full            Yes       Yes                                    +---------+---------------+---------+-----------+----------+--------------+  PTV       Full                                                             +---------+---------------+---------+-----------+----------+--------------+  PERO      Full                                                             +---------+---------------+---------+-----------+----------+--------------+     Summary: BILATERAL: - No evidence of deep vein thrombosis seen in the lower extremities, bilaterally. -No evidence of popliteal cyst, bilaterally.   *See table(s) above for measurements and observations. Electronically signed by Orlie Pollen on 12/01/2021 at 6:40:43 PM.    Final    Recent Labs    12/01/21 0549  WBC 9.6  HGB 11.1*  HCT 37.3  PLT 420*    Recent Labs    12/01/21 0549  NA 135  K 4.6  CL 102  CO2 25  GLUCOSE 96  BUN 27*  CREATININE 1.19*  CALCIUM 9.0     Intake/Output Summary (Last 24 hours) at 12/03/2021 0830 Last data filed at 12/03/2021 0600 Gross per 24 hour  Intake 596 ml  Output 1050 ml  Net -454 ml         Physical Exam: Vital Signs Blood pressure (!) 105/59, pulse 63, temperature (!) 97.5 F (36.4 C), resp. rate 18, height 5\' 6"  (1.676 m), weight 126 kg, SpO2 98 %.  General: No acute distress Mood and affect are appropriate Heart: Regular rate and rhythm no rubs murmurs or extra sounds Lungs: Clear to auscultation, breathing unlabored, no rales or wheezes Abdomen: Positive bowel sounds, soft nontender to palpation, nondistended Extremities: No clubbing, cyanosis, or edema Skin: No evidence of breakdown, no evidence of rash Neurologic: Cranial nerves II through XII intact, motor strength is /5 in bilateral deltoid, bicep, tricep, grip, 2- bilateral hip flexor, knee extensors, ankle dorsiflexor and plantar flexor Sensory exam  parasthesia to LT in BLE Cerebellar exam normal finger to nose to finger as well as heel to shin in bilateral upper and lower extremities Musculoskeletal: Pain with Bilateral knee ROM, no knee effusion or tenderness     Assessment/Plan: 1. Functional deficits which require 3+ hours per day of interdisciplinary therapy in a comprehensive inpatient rehab setting. Physiatrist is providing close team supervision  and 24 hour management of active medical problems listed below. Physiatrist and rehab team continue to assess barriers to discharge/monitor patient progress toward functional and medical goals  Care Tool:  Bathing    Body parts bathed by patient: Right arm, Left arm, Chest, Abdomen, Front perineal area, Right upper leg, Left upper leg, Face   Body parts bathed by helper: Buttocks, Right lower leg, Left lower leg     Bathing assist Assist Level: Maximal Assistance - Patient 24 - 49%     Upper Body Dressing/Undressing Upper body dressing   What is the patient wearing?: Pull over shirt    Upper body assist Assist Level: Moderate Assistance - Patient 50 - 74%    Lower Body Dressing/Undressing Lower body dressing      What is the patient wearing?: Underwear/pull up     Lower body assist Assist for lower body dressing: 2 Helpers     Toileting Toileting Toileting Activity did not occur (Clothing management and hygiene only): N/A (no void or bm)  Toileting assist Assist for toileting: Total Assistance - Patient < 25%     Transfers Chair/bed transfer  Transfers assist     Chair/bed transfer assist level: 2 Helpers     Locomotion Ambulation   Ambulation assist   Ambulation activity did not occur: Safety/medical concerns          Walk 10 feet activity   Assist  Walk 10 feet activity did not occur: Safety/medical concerns        Walk 50 feet activity   Assist Walk 50 feet with 2 turns activity did not occur: Safety/medical concerns          Walk 150 feet activity   Assist Walk 150 feet activity did not occur: Safety/medical concerns         Walk 10 feet on uneven surface  activity   Assist Walk 10 feet on uneven surfaces activity did not occur: Safety/medical concerns         Wheelchair     Assist Is the patient using a wheelchair?: Yes Type of Wheelchair: Manual    Wheelchair assist level: Moderate Assistance - Patient 50 - 74% Max wheelchair distance: 150    Wheelchair 50 feet with 2 turns activity    Assist        Assist Level: Moderate Assistance - Patient 50 - 74%   Wheelchair 150 feet activity     Assist      Assist Level: Moderate Assistance - Patient 50 - 74%   Blood pressure (!) 105/59, pulse 63, temperature (!) 97.5 F (36.4 C), resp. rate 18, height 5\' 6"  (1.676 m), weight 126 kg, SpO2 98 %.  Medical Problem List and Plan: 1. 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 - Decadron taper             -patient may shower             -ELOS/Goals: 10-12 days, supervision goals with PT and OT 2.  Antithrombotics: -DVT/anticoagulation:  Pharmaceutical: Heparin.  Check vascular study             -antiplatelet therapy: Aspirin 325 mg daily 3. 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 Mood: Trazodone 150 mg nightly, Xanax 0.25 mg twice daily as needed             -antipsychotic agents: N/A 5. Neuropsych: This patient is  capable of making decisions on his own behalf. 6. Skin/Wound Care: Routine skin checks             -encourage appropriate nutrition              -pressure relief 7. Fluids/Electrolytes/Nutrition:               I personally reviewed the patient's labs today.               -add protein supp for low albumin 8.  Diabetes mellitus.  Hemoglobin A1c 6.0.                CBG (last 3)  Recent Labs (last 2 labs)        Recent Labs    11/30/21 1645 11/30/21 2154  12/01/21 0601  GLUCAP 154* 130* 97          controlled 2/18 9.  Hypertension.  Lopressor 25 mg twice daily, hydralazine 25 mg every 8 hours.    2/18 bp well controlled Vitals:   12/02/21 1948 12/03/21 0506  BP: (!) 100/51 (!) 105/59  Pulse: (!) 59 63  Resp: 18 18  Temp: 97.6 F (36.4 C) (!) 97.5 F (36.4 C)  SpO2: 98% 98%    10.  Hyperlipidemia.  Zocor 11.  Microcytic anemia.  Continue iron supplement.  Follow-up CBC 12.  Acute urinary retention/neurogenic bladder.  Foley tube removed.   -Presently on Flomax 0.4 mg.   -need pvr's 13.  Constipation/neurogenic bowel.   -Senokot twice daily, MiraLAX daily 2/17-had small, hard bm yesterday and minimal before             Give sorbitol today             -fleet enema if needed   14.  Obesity.  BMI 39.5.  Dietary follow-up 15.  History of CVA.  Modified independent with straight point cane prior to admission.  Continue aspirin 16.  Leukocytosis.  Suspect steroid-induced--improved to 9.6 2/17    LOS: 3 days A FACE TO FACE EVALUATION WAS PERFORMED  Charlett Blake 12/03/2021, 8:30 AM

## 2021-12-04 DIAGNOSIS — G959 Disease of spinal cord, unspecified: Secondary | ICD-10-CM | POA: Diagnosis not present

## 2021-12-04 LAB — BASIC METABOLIC PANEL
Anion gap: 10 (ref 5–15)
BUN: 31 mg/dL — ABNORMAL HIGH (ref 8–23)
CO2: 23 mmol/L (ref 22–32)
Calcium: 8.9 mg/dL (ref 8.9–10.3)
Chloride: 100 mmol/L (ref 98–111)
Creatinine, Ser: 1.56 mg/dL — ABNORMAL HIGH (ref 0.44–1.00)
GFR, Estimated: 36 mL/min — ABNORMAL LOW (ref >60)
Glucose, Bld: 121 mg/dL — ABNORMAL HIGH (ref 70–99)
Potassium: 4.8 mmol/L (ref 3.5–5.1)
Sodium: 133 mmol/L — ABNORMAL LOW (ref 135–145)

## 2021-12-04 LAB — CBC
HCT: 37.9 % (ref 36.0–46.0)
Hemoglobin: 11.3 g/dL — ABNORMAL LOW (ref 12.0–15.0)
MCH: 21.4 pg — ABNORMAL LOW (ref 26.0–34.0)
MCHC: 29.8 g/dL — ABNORMAL LOW (ref 30.0–36.0)
MCV: 71.9 fL — ABNORMAL LOW (ref 80.0–100.0)
Platelets: 360 10*3/uL (ref 150–400)
RBC: 5.27 MIL/uL — ABNORMAL HIGH (ref 3.87–5.11)
RDW: 29.2 % — ABNORMAL HIGH (ref 11.5–15.5)
WBC: 7.8 10*3/uL (ref 4.0–10.5)
nRBC: 0 % (ref 0.0–0.2)

## 2021-12-04 LAB — GLUCOSE, CAPILLARY
Glucose-Capillary: 122 mg/dL — ABNORMAL HIGH (ref 70–99)
Glucose-Capillary: 127 mg/dL — ABNORMAL HIGH (ref 70–99)
Glucose-Capillary: 175 mg/dL — ABNORMAL HIGH (ref 70–99)
Glucose-Capillary: 143 mg/dL — ABNORMAL HIGH (ref 70–99)

## 2021-12-04 MED ORDER — OXYCODONE HCL 5 MG PO TABS
5.0000 mg | ORAL_TABLET | Freq: Four times a day (QID) | ORAL | Status: DC | PRN
Start: 2021-12-04 — End: 2021-12-07
  Administered 2021-12-04 – 2021-12-07 (×8): 5 mg via ORAL
  Filled 2021-12-04 (×8): qty 1

## 2021-12-04 MED ORDER — SENNA 8.6 MG PO TABS
2.0000 | ORAL_TABLET | Freq: Every day | ORAL | Status: DC
  Administered 2021-12-04 – 2021-12-07 (×4): 17.2 mg via ORAL
  Filled 2021-12-04 (×3): qty 2

## 2021-12-04 MED ORDER — SORBITOL 70 % SOLN
30.0000 mL | Freq: Once | Status: AC
  Administered 2021-12-04: 30 mL via ORAL
  Filled 2021-12-04: qty 30

## 2021-12-04 NOTE — Progress Notes (Signed)
PROGRESS NOTE   Subjective/Complaints:   Miralax doesn't work for her- wants sorbitol to have a BM.   LBM 3 days ago- knees are hurting bad- took oxycodone/voltaren gel and tylenol-  Said nothing is helping knee pain.     ROS-   Pt denies SOB, abd pain, CP, N/V(+)/C/D, and vision changes  Objective:   No results found. Recent Labs    12/04/21 0549  WBC 7.8  HGB 11.3*  HCT 37.9  PLT 360   Recent Labs    12/04/21 0549  NA 133*  K 4.8  CL 100  CO2 23  GLUCOSE 121*  BUN 31*  CREATININE 1.56*  CALCIUM 8.9    Intake/Output Summary (Last 24 hours) at 12/04/2021 1350 Last data filed at 12/04/2021 0755 Gross per 24 hour  Intake 660 ml  Output 1243 ml  Net -583 ml        Physical Exam: Vital Signs Blood pressure (!) 96/58, pulse 73, temperature 97.9 F (36.6 C), resp. rate 18, height 5\' 6"  (1.676 m), weight 126 kg, SpO2 98 %.  BP 96/58- asymptomatic  General: awake, alert, appropriate, supine in bed; c/o pain; NAD HENT: conjugate gaze; oropharynx moist CV: regular rate; no JVD Pulmonary: CTA B/L; no W/R/R- good air movement GI: soft, NT, ND, (+)BS Psychiatric: appropriate- less interactive than epxected, but polite Neurological: alert  Extremities: No clubbing, cyanosis, or edema Skin: No evidence of breakdown, no evidence of rash Neurologic: Cranial nerves II through XII intact, motor strength is /5 in bilateral deltoid, bicep, tricep, grip, 2- bilateral hip flexor, knee extensors, ankle dorsiflexor and plantar flexor Sensory exam parasthesia to LT in BLE Cerebellar exam normal finger to nose to finger as well as heel to shin in bilateral upper and lower extremities Musculoskeletal: Pain with Bilateral knee ROM, no knee effusion or tenderness     Assessment/Plan: 1. Functional deficits which require 3+ hours per day of interdisciplinary therapy in a comprehensive inpatient rehab  setting. Physiatrist is providing close team supervision and 24 hour management of active medical problems listed below. Physiatrist and rehab team continue to assess barriers to discharge/monitor patient progress toward functional and medical goals  Care Tool:  Bathing    Body parts bathed by patient: Right arm, Left arm, Chest, Abdomen, Front perineal area, Right upper leg, Left upper leg, Face   Body parts bathed by helper: Buttocks, Right lower leg, Left lower leg     Bathing assist Assist Level: Maximal Assistance - Patient 24 - 49%     Upper Body Dressing/Undressing Upper body dressing   What is the patient wearing?: Pull over shirt    Upper body assist Assist Level: Moderate Assistance - Patient 50 - 74%    Lower Body Dressing/Undressing Lower body dressing      What is the patient wearing?: Underwear/pull up     Lower body assist Assist for lower body dressing: 2 Helpers     Toileting Toileting Toileting Activity did not occur (Clothing management and hygiene only): N/A (no void or bm)  Toileting assist Assist for toileting: Total Assistance - Patient < 25%     Transfers Chair/bed transfer  Transfers assist  Chair/bed transfer assist level: 2 Helpers (slide board)     Locomotion Ambulation   Ambulation assist   Ambulation activity did not occur: Safety/medical concerns          Walk 10 feet activity   Assist  Walk 10 feet activity did not occur: Safety/medical concerns        Walk 50 feet activity   Assist Walk 50 feet with 2 turns activity did not occur: Safety/medical concerns         Walk 150 feet activity   Assist Walk 150 feet activity did not occur: Safety/medical concerns         Walk 10 feet on uneven surface  activity   Assist Walk 10 feet on uneven surfaces activity did not occur: Safety/medical concerns         Wheelchair     Assist Is the patient using a wheelchair?: Yes Type of Wheelchair:  Manual    Wheelchair assist level: Minimal Assistance - Patient > 75% Max wheelchair distance: 51'    Wheelchair 50 feet with 2 turns activity    Assist        Assist Level: Minimal Assistance - Patient > 75%   Wheelchair 150 feet activity     Assist      Assist Level: Moderate Assistance - Patient 50 - 74%   Blood pressure (!) 96/58, pulse 73, temperature 97.9 F (36.6 C), resp. rate 18, height 5\' 6"  (1.676 m), weight 126 kg, SpO2 98 %.  Medical Problem List and Plan: 1. 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 - Decadron taper             -patient may shower             -ELOS/Goals: 10-12 days, supervision goals with PT and OT  -con't PT and OT- CIR 2.  Antithrombotics: -DVT/anticoagulation:  Pharmaceutical: Heparin.  Check vascular study             -antiplatelet therapy: Aspirin 325 mg daily 3. Pain Management: Oxycodone as needed Knee pain 2019 xray show severe OA on RIght and Moderate on Left  OA will add voltaren gel   2/20- will increase oxycodone to q6 hours prn 4. Mood: Trazodone 150 mg nightly, Xanax 0.25 mg twice daily as needed             -antipsychotic agents: N/A 5. Neuropsych: This patient is capable of making decisions on his own behalf. 6. Skin/Wound Care: Routine skin checks             -encourage appropriate nutrition              -pressure relief 7. Fluids/Electrolytes/Nutrition:               I personally reviewed the patient's labs today.               -add protein supp for low albumin 8.  Diabetes mellitus.  Hemoglobin A1c 6.0.                CBG (last 3)  Recent Labs (last 2 labs)        Recent Labs    11/30/21 1645 11/30/21 2154 12/01/21 0601  GLUCAP 154* 130* 97          2/20- CBGs controlled- con't regimen 9.  Hypertension.  Lopressor 25 mg twice daily, hydralazine 25 mg every 8 hours.      2/20- BP a little low this AM- not drinking well= probably playing  a part- will con't to monitor trend Vitals:    12/03/21 2009 12/04/21 0433  BP: (!) 119/50 (!) 96/58  Pulse: 71 73  Resp: 19 18  Temp: 98.1 F (36.7 C) 97.9 F (36.6 C)  SpO2: 98% 98%    10.  Hyperlipidemia.  Zocor 11.  Microcytic anemia.  Continue iron supplement.  Follow-up CBC 12.  Acute urinary retention/neurogenic bladder.  Foley tube removed.   -Presently on Flomax 0.4 mg.   -need pvr's 13.  Constipation/neurogenic bowel.   -Senokot twice daily, MiraLAX daily 2/17-had small, hard bm yesterday and minimal before             Give sorbitol today             -fleet enema if needed  2/20- will give sorbitol today 14.  Obesity.  BMI 39.5.  Dietary follow-up 15.  History of CVA.  Modified independent with straight point cane prior to admission.  Continue aspirin 16.  Leukocytosis.  Suspect steroid-induced--improved to 9.6 2/17  2/20- WBC donw to 7.8k- con't regimen 17. AKI  2/20- Cr up to 1.56 and BUN up to 31- will recheck Wednesday and push fluids- hopefully will help BP and Cr.    I spent a total of 35   minutes on total care today- >50% coordination of care- due to prolonged d/w with pt as well as nursing.      LOS: 4 days A FACE TO FACE EVALUATION WAS PERFORMED  Brandi [name] 12/04/2021, 1:50 PM

## 2021-12-04 NOTE — Progress Notes (Signed)
Physical Therapy Session Note  Patient Details  Name: Brandi [name] MRN: [id number] Date of Birth: 1956-04-24  Today's Date: 12/04/2021 PT Individual Time: 0800-0858 PT Individual Time Calculation (min): 58 min   Short Term Goals: Week 1:  PT Short Term Goal 1 (Week 1): Pt will transfer to [facility] with Slide board and max assist PT Short Term Goal 2 (Week 1): Pt will performed sit<>stand in parallel bars with max assist +2 PT Short Term Goal 3 (Week 1): Pt will propel WC 177f with supervision assist PT Short Term Goal 4 (Week 1): pt will tolerate sitting up in WC >2 hours between therapies in WC PT Short Term Goal 5 - Progress (Week 1): Revised due to lack of progress  Skilled Therapeutic Interventions/Progress Updates:      Pt greeted supine in bed - reports 5/10 bilateral knee pain that she recently received pain medication for. Pt also reports bladder incontinence - reports that she called nursing for assistance but they didn't arrive in time (no signs of calling for assistance based on Hill-ROM in room). Completed brief change and pericare with totalA at bed level. Rolling in bed to her R with minA using bed rails and modA to the L using bed rails - difficulty achieving full sidelying due to body habitus and weakness.   Completed supine<>sit with modA with HOB flat, using bed rail - assist needed for both BLE and trunk support. Once sitting, able to sit unsupported with CGA. Provided bariatric STEDY and from raised EOB, [place] sit[place] - difficulty achieving full upright with inadequate hip and trunk extension with R truncal lean. While sitting in perched position, she required minA for balance due to R lean. Completed 1x5 sit<>stands from perched position with +2 maxA, unable to stand longer than 5 seconds due to fatigue. Completed 1x5 "arm push[place]ups" in the [place] support due to premorbid R sided weakness from prior CVA. Stedy transfer completed with  dependant assist, requiring +2 for steering and minA for sitting balance in perched position.   Required +2 maxA for scooting posteriorly in w/c - VC/TC for "walking her hips' backwards which she responded well too - demonstrated brief episodes of ability to scoot without assist but motor planning and body habitus limiting.  She propelled herself ~546fminA in w/c using BUE's to ortho rehab gym - decreased stroke propulsion and efficiency with decreased speed.  Setup in standing frame (weight restriction 300#, pt weights 277# per chart). Completed 2 stands dependant assist (encouraged her to put forth effort during each stand). 1st stand she could tolerate standing for 5 minutes and 2nd stand shet tolerated for 3 minutes. While standing, we worked on postural control with emphasis on shldr/trunk extension, glut sets for hip extension.   Transported back to her room in w/c for time and energy conservation. Concluded session seated in w/c with safety belt alarm on in place, all needs met, call bell in reach.   Therapy Documentation Precautions:  Precautions Precautions: Fall Restrictions Weight Bearing Restrictions: No General:     Therapy/Group: Individual Therapy  Tracker Mance P Ulla Mckiernan 12/04/2021, 9:15 AM

## 2021-12-04 NOTE — Progress Notes (Signed)
Physical Therapy Session Note  Patient Details  Name: Brandi [name] MRN: [id number] Date of Birth: 1955/12/31  Today's Date: 12/04/2021 PT Individual Time: 1100-1200 PT Individual Time Calculation (min): 60 min   Short Term Goals: Week 1:  PT Short Term Goal 1 (Week 1): Pt will transfer to [facility] with Slide board and max assist PT Short Term Goal 2 (Week 1): Pt will performed sit<>stand in parallel bars with max assist +2 PT Short Term Goal 3 (Week 1): Pt will propel WC 140ft with supervision assist PT Short Term Goal 4 (Week 1): pt will tolerate sitting up in WC >2 hours between therapies in WC PT Short Term Goal 5 - Progress (Week 1): Revised due to lack of progress  Skilled Therapeutic Interventions/Progress Updates:    Pt received seated in w/c in room, agreeable to PT session. No complaints of pain this date. Dependent transport via w/c to therapy gym for time conservation. Slide board transfer w/c to mat table with max A with increased time needed for sequencing and motor planning. Session focus on sitting balance EOM. Pt able perform anterior leans and lateral leans x 10 reps each with CGA to min A needed to return to midline, LOB posteriorly 2x with max A needed to recover. Seated alt UE reaching outside BOS and across midline with CGA for sitting balance. Seated 3# dowel rod chest press 3 x 10 reps with cues needed to maintain upright trunk during exercise. Slide board transfer back to w/c with total A x 2 needed due to posterior lean during transfer and sliding off board anteriorly. Attempt to have pt perform manual w/c propulsion x 50 ft with min A needed with use of BUE, cues to attend to R hand. Pt continues to struggle with motor planning with RUE. Pt requests to remain seated in w/c at end of session, needs in reach, quick release belt and chair alarm in place.  Therapy Documentation Precautions:  Precautions Precautions: Fall Restrictions Weight Bearing Restrictions:  No       Therapy/Group: Individual Therapy   Excell Seltzer, PT, DPT, CSRS  12/04/2021, 12:14 PM

## 2021-12-04 NOTE — Progress Notes (Signed)
Occupational Therapy Session Note  Patient Details  Name: Brandi [name] MRN: [id number] Date of Birth: 1955/10/31  Today's Date: 12/04/2021 OT Individual Time: 1003-1057 OT Individual Time Calculation (min): 54 min    Short Term Goals: Week 1:  OT Short Term Goal 1 (Week 1): Pt will don UB clothing with CGA OT Short Term Goal 2 (Week 1): Pt will complete sit > stand with max A of 1 using LRAD OT Short Term Goal 3 (Week 1): Pt will complete bed mobility to EOB with CGA  Skilled Therapeutic Interventions/Progress Updates:  Pt greeted  seated in w/c  agreeable to OT intervention. Session focus on functional mobility, BUE strengthening/ endurance and decreasing overall caregiver burden.  Attempted x2 sit<>stands with bari stedy with pt unable to stand despite MAX +2 efforts. Pt just sliding forward and reports that this AM she was leaning too far forward reporting fear of falling. Pt transported to gym with total A for time mgmt. Remainder of session to focus on BUE strengthening and endurance with pt completing 5 mins of Scifit at level 5 completing forward propulsions; 3 min rest break; 5 more mins completing backward propulsions on level 5. Pt noted to lean laterally to R side needing pillow support on R side.  HR 108 bpm SpO2 98% on RA. Pt completed below therex with 3lb dowel rod to promote improved BUE strength for higher level functional mobility:  2x10 chest presses 2x10 bicep curls 2x10 OH presses  Of note pt needed MAX multimodal cues to recall body mechanics as well as intermittent hand over hand assist to demo correct body mechanics, pt would perseverate on key cues such as "to my chest to my knees" when doing bicep curls. Pt states "I just forget a lot". Pt transported back to room with total A where pt left up in w/c with alarm belt activated and all needs within reach.                      Therapy Documentation Precautions:  Precautions Precautions: Fall Restrictions Weight  Bearing Restrictions: No  Pain: no pain reported during session     Therapy/Group: Individual Therapy  Corinne Ports [facility] 12/04/2021, 10:58 AM

## 2021-12-05 DIAGNOSIS — G959 Disease of spinal cord, unspecified: Secondary | ICD-10-CM | POA: Diagnosis not present

## 2021-12-05 LAB — GLUCOSE, CAPILLARY
Glucose-Capillary: 113 mg/dL — ABNORMAL HIGH (ref 70–99)
Glucose-Capillary: 120 mg/dL — ABNORMAL HIGH (ref 70–99)
Glucose-Capillary: 113 mg/dL — ABNORMAL HIGH (ref 70–99)
Glucose-Capillary: 122 mg/dL — ABNORMAL HIGH (ref 70–99)

## 2021-12-05 MED ORDER — HYDRALAZINE HCL 10 MG PO TABS
10.0000 mg | ORAL_TABLET | Freq: Three times a day (TID) | ORAL | Status: DC
  Administered 2021-12-05 (×2): 10 mg via ORAL
  Filled 2021-12-05 (×4): qty 1

## 2021-12-05 NOTE — Plan of Care (Signed)
°  Problem: Consults Goal: RH GENERAL PATIENT EDUCATION Description: See Patient Education module for education specifics. Outcome: Progressing   Problem: RH BOWEL ELIMINATION Goal: RH STG MANAGE BOWEL WITH ASSISTANCE Description: STG Manage Bowel with Supervision Assistance. Outcome: Progressing Goal: RH STG MANAGE BOWEL W/MEDICATION W/ASSISTANCE Description: STG Manage Bowel with Medication with Supervision Assistance. Outcome: Progressing   Problem: RH BLADDER ELIMINATION Goal: RH STG MANAGE BLADDER WITH ASSISTANCE Description: STG Manage Bladder With Supervision Assistance Outcome: Progressing Goal: RH STG MANAGE BLADDER WITH MEDICATION WITH ASSISTANCE Description: STG Manage Bladder With Medication With Supervision Assistance. Outcome: Progressing   Problem: RH SKIN INTEGRITY Goal: RH STG MAINTAIN SKIN INTEGRITY WITH ASSISTANCE Description: STG Maintain Skin Integrity With Supervision Assistance. Outcome: Progressing   Problem: RH SAFETY Goal: RH STG ADHERE TO SAFETY PRECAUTIONS W/ASSISTANCE/DEVICE Description: STG Adhere to Safety Precautions With Cues and Reminders. Outcome: Progressing Goal: RH STG DECREASED RISK OF FALL WITH ASSISTANCE Description: STG Decreased Risk of Fall With Supervision Assistance. Outcome: Progressing   Problem: RH PAIN MANAGEMENT Goal: RH STG PAIN MANAGED AT OR BELOW PT'S PAIN GOAL Description: < 3 on a 0-10 pain scale. Outcome: Progressing   Problem: RH KNOWLEDGE DEFICIT GENERAL Goal: RH STG INCREASE KNOWLEDGE OF SELF CARE AFTER HOSPITALIZATION Description: Patient will demonstrate knowledge of self-care, medication/pain management, bowel/bladder management, and weight bearing precautions with educational materials and handouts provided by staff independently at discharge. Outcome: Progressing

## 2021-12-05 NOTE — Progress Notes (Signed)
Physical Therapy Session Note  Patient Details  Name: Brandi [name] MRN: [id number] Date of Birth: September 05, 1956  Today's Date: 12/05/2021 PT Individual Time: 0800-0900 PT Individual Time Calculation (min): 60 min   Short Term Goals: Week 1:  PT Short Term Goal 1 (Week 1): Pt will transfer to [facility] with Slide board and max assist PT Short Term Goal 2 (Week 1): Pt will performed sit<>stand in parallel bars with max assist +2 PT Short Term Goal 3 (Week 1): Pt will propel WC 1100ft with supervision assist PT Short Term Goal 4 (Week 1): pt will tolerate sitting up in WC >2 hours between therapies in WC PT Short Term Goal 5 - Progress (Week 1): Revised due to lack of progress  Skilled Therapeutic Interventions/Progress Updates:    Pt received supine in bed, agreeable to PT session. Pt reports 10/10 pain in B knees this AM due to OA. Nursing not available to provide pain medication until end of therapy session. Assisted pt with applying Voltaren gel to B knees for pain management. Pt found to be incontinent of urine in bed. Rolling L/R with max A and cueing for sequencing and hand placement to assist with transfer for dependent pericare and brief change as well as donning pants at bed level. Supine to sit with max A for LE management and trunk elevation with HOB elevated and use of bedrail. Pt is min A to doff nightgown and don bra and shirt while seated EOB. Attempt to stand 3x to stedy with assist x 2 (pt's son assisting) but pt unable to achieve stance due to LE weakness. Slide board transfer bed to w/c with max A x 2 for safety. Pt agreeable to remain seated in w/c at end of session, needs in reach, son present.  Therapy Documentation Precautions:  Precautions Precautions: Fall Restrictions Weight Bearing Restrictions: No        Therapy/Group: Individual Therapy   Excell Seltzer, PT, DPT, CSRS  12/05/2021, 3:12 PM

## 2021-12-05 NOTE — Progress Notes (Signed)
Physical Therapy Session Note  Patient Details  Name: Brandi [name] MRN: [id number] Date of Birth: February 10, 1956  Today's Date: 12/05/2021 PT Individual Time: 7628-3151 PT Individual Time Calculation (min): 42 min   Short Term Goals: Week 1:  PT Short Term Goal 1 (Week 1): Pt will transfer to [facility] with Slide board and max assist PT Short Term Goal 2 (Week 1): Pt will performed sit<>stand in parallel bars with max assist +2 PT Short Term Goal 3 (Week 1): Pt will propel WC 132ft with supervision assist PT Short Term Goal 4 (Week 1): pt will tolerate sitting up in WC >2 hours between therapies in WC PT Short Term Goal 5 - Progress (Week 1): Revised due to lack of progress  Skilled Therapeutic Interventions/Progress Updates:     Pt supine in bed to start session. Her son at bedside but leaves upon start of session. Pt reports 5/10 B knee pain - provided rest breaks and repositioning for pain management. Supine<>sitting with maxA with HOB fully elevated. Able to sit unsupported at EOB with CGA. Completed sit<>stand using STEDY with +2 totalA (pt <25%). Pt with significant difficulty achieving full upright with inadequate and insufficient hip/trunk extension. In perched position, she initially required minA for static sitting but this faded to modA with fatigue. Completed 1x5 sit<>stands from perched position with +2 totalA, lacking full upright. Dependent transfer in stedy to w/c and she was able to reposition hips posteriorly with supervision and VC for "walking" her hips backwards.  Focused remainder of session on w/c mobility. She could propel herself ~1ft + ~22ft (seated rest) with CGA in w/c using BUE's to propel. She struggled with motor planning, using RUE to push, and maintaining straight path - required mod instructional cues throughout. We also worked on figure-8 w/c propulsion which she required minA for clearing cones and had the most difficulty with turning to her L due to R arm weakness  and motor planning deficits. She also had difficulty entering wide doorway into her room, despite instruction for "wide turns" and would benefit from further practice.   She concluded session seated in w/c, safety belt alarm on, all immediate needs within reach.   Therapy Documentation Precautions:  Precautions Precautions: Fall Restrictions Weight Bearing Restrictions: No General:    Therapy/Group: Individual Therapy  Kristeen Lantz P Delphin Funes 12/05/2021, 8:00 AM

## 2021-12-05 NOTE — Patient Care Conference (Signed)
Inpatient RehabilitationTeam Conference and Plan of Care Update Date: 12/05/2021   Time: 11:01 AM    Patient Name: Brandi [name]      Medical Record Number: [id number]  Date of Birth: 29-Aug-1956 Sex: Female         Room/Bed: 4M05C/4M05C-01 Payor Info: Payor: [employer] MEDICARE / Plan: [facility] MEDICARE / Product Type: *No Product type* /    Admit Date/Time:  11/30/2021  5:17 PM  Primary Diagnosis:  Myelopathy [facility])  Hospital Problems: Principal Problem:   Myelopathy [facility])    Expected Discharge Date: Expected Discharge Date: 12/26/21  Team Members Present: Physician leading conference: Dr. Courtney Heys Social Worker Present: Loralee Pacas, [place] Nurse Present: Dorthula Nettles, RN PT Present: Excell Seltzer, PT OT Present: Roanna Epley, COTA;Jennifer Tamala Julian, OT PPS Coordinator present : Gunnar Fusi, SLP     Current Status/Progress Goal Weekly Team Focus  Bowel/Bladder   Pt is cont of B/B  Remain cont of B/B  Assess toileting needs q shift and PRN   Swallow/Nutrition/ Hydration             ADL's   bed mobility-mod/max A; SB transfers +2; unable to stand in [place]; UB bathing/dressing-min A; LB bathing/dressing-max A; motot planning deficits  min A overall w/c level  bed mobilty, tranfsers, sitting balance, BADLs, education   Mobility   mod to max A bed mobility, +2 transfers via SB or stedy pending LE weakness, min A w/c mobility x 50 ft; significant LE weakness and motor planning deficits  min to mod A at w/c level  sitting balance, transfers, LE NMR, w/c mobility, standing as safe and able   Communication             Safety/Cognition/ Behavioral Observations            Pain   Pt c/o bilateral knee pain rated 10/10  Remain <4  Assess pain qshift and PRN   Skin   Pt has stage II sacral wound, foam dressing applied  Wound to heal and remain free from new skin impairments  Assess qshift and PRN     Discharge Planning:  Pt to d/c tohome with her son [name] who  will provide 24/7 care. Patient will confirm no barriers to discharge.   Team Discussion: Bowel movement with Sorbitol. BP low, Cr elevated. Encourage fluids. Incontinent/continent B/B. Bilateral knee pain. Abrasion vs pressure injury to bottom with foam dressing. Home with son, will provide 24/7 care. Ramp being installed but still 5 steps to enter home.  Patient on target to meet rehab goals: Min assist overall goals. Currently mod/max assist +2 with slide board. Max assist +1 with steady.   *See Care Plan and progress notes for long and short-term goals.   Revisions to Treatment Plan:  Adjusting medications   Teaching Needs: Family education, medication/pain management, skin/wound care, bowel/bladder management, transfer training, etc.   Current Barriers to Discharge: Home enviroment access/layout, Incontinence, Wound care, and weakness, motor planning deficits.  Possible Resolutions to Barriers: Family education Ramp installation Follow-up PT/OT     Medical Summary Current Status: has stage II on L buttock- vs abrasion- ; contin/incontinent- knows, but cannot get there in time-alos severe B/L knee pain- asking for meds frequently  Barriers to Discharge: Weight;Decreased family/caregiver support;Home enviroment access/layout;Incontinence;Medical stability;Weight bearing restrictions;Wound care  Barriers to Discharge Comments: home with son-supportive but has 10+ steps to get into house Possible Resolutions to Barriers/Weekly Focus: (+) 2 sliding board transfers- legs too weak to stand- motor planning  issues due to previous CVA-needs to get in steps-3/14- d/c   Continued Need for Acute Rehabilitation Level of Care: The patient requires daily medical management by a physician with specialized training in physical medicine and rehabilitation for the following reasons: Direction of a multidisciplinary physical rehabilitation program to maximize functional independence : Yes Medical  management of patient stability for increased activity during participation in an intensive rehabilitation regime.: Yes Analysis of laboratory values and/or radiology reports with any subsequent need for medication adjustment and/or medical intervention. : Yes   I attest that I was present, lead the team conference, and concur with the assessment and plan of the team.   Cristi Loron 12/05/2021, 2:22 PM

## 2021-12-05 NOTE — Progress Notes (Signed)
Patient ID: Danecia A Murcia, female   DOB: 11/24/1955, 65 y.o.   MRN: [id number] ° °SW spoke with pt son Mike ([phone]) to introduce self, explain role, discuss discharge process, and ELOS 21-25 days. SW confirmed with him that he will be primary caregiver for his mother. He is aware SW will follow-up after team conference with updates. SW made corrections to chart with his correct phone number. ° °*SW met with pt and pt son Michael to provide updates from team conference, and d/c date 3/14. He confirms that he is working on getting his mother a ramp built through social services in their county and they will be coming out to the home this week. Confirms he will be there to provide support. He would like updates on his mother's medical condition to explain what happened to her. SW shared his concerns with medical team.  ° °Auria Chamberlain, MSW, LCSWA °Office: [phone] °Cell: [phone] °Fax: [phone]  °

## 2021-12-05 NOTE — Progress Notes (Signed)
PROGRESS NOTE   Subjective/Complaints:  Had a good BM with sorbitol last night-  B/L knee pain- is chronic.   ROS-   Pt denies SOB, abd pain, CP, N/V/C/D, and vision changes  Objective:   No results found. Recent Labs    12/04/21 0549  WBC 7.8  HGB 11.3*  HCT 37.9  PLT 360   Recent Labs    12/04/21 0549  NA 133*  K 4.8  CL 100  CO2 23  GLUCOSE 121*  BUN 31*  CREATININE 1.56*  CALCIUM 8.9    Intake/Output Summary (Last 24 hours) at 12/05/2021 5732 Last data filed at 12/05/2021 0700 Gross per 24 hour  Intake 360 ml  Output --  Net 360 ml     Pressure Injury 12/04/21 Buttocks Left Stage 2 -  Partial thickness loss of dermis presenting as a shallow open injury with a red, pink wound bed without slough. Stage 2 pressure injury L buttocks cheek (Active)  12/04/21 1458  Location: Buttocks  Location Orientation: Left  Staging: Stage 2 -  Partial thickness loss of dermis presenting as a shallow open injury with a red, pink wound bed without slough.  Wound Description (Comments): Stage 2 pressure injury L buttocks cheek  Present on Admission: Yes    Physical Exam: Vital Signs Blood pressure (!) 94/54, pulse 77, temperature 98.4 F (36.9 C), temperature source Oral, resp. rate 14, height 5\' 6"  (1.676 m), weight 126 kg, SpO2 98 %.  BP 94/54 General: awake, alert, appropriate, sitting up in bed; son at bedside; NAD HENT: conjugate gaze; oropharynx moist CV: regular rate; no JVD Pulmonary: CTA B/L; no W/R/R- good air movement GI: soft, NT, ND, (+)BS Psychiatric: appropriate- interactive Neurological: alert Extremities: No clubbing, cyanosis, or edema Skin: No evidence of breakdown, no evidence of rash Neurologic: Cranial nerves II through XII intact, motor strength is /5 in bilateral deltoid, bicep, tricep, grip, 2- bilateral hip flexor, knee extensors, ankle dorsiflexor and plantar flexor Sensory exam  parasthesia to LT in BLE Cerebellar exam normal finger to nose to finger as well as heel to shin in bilateral upper and lower extremities Musculoskeletal: Pain with Bilateral knee ROM, no knee effusion or tenderness     Assessment/Plan: 1. Functional deficits which require 3+ hours per day of interdisciplinary therapy in a comprehensive inpatient rehab setting. Physiatrist is providing close team supervision and 24 hour management of active medical problems listed below. Physiatrist and rehab team continue to assess barriers to discharge/monitor patient progress toward functional and medical goals  Care Tool:  Bathing    Body parts bathed by patient: Right arm, Left arm, Chest, Abdomen, Front perineal area, Right upper leg, Left upper leg, Face   Body parts bathed by helper: Buttocks, Right lower leg, Left lower leg     Bathing assist Assist Level: Maximal Assistance - Patient 24 - 49%     Upper Body Dressing/Undressing Upper body dressing   What is the patient wearing?: Pull over shirt    Upper body assist Assist Level: Moderate Assistance - Patient 50 - 74%    Lower Body Dressing/Undressing Lower body dressing      What is the patient wearing?:  Underwear/pull up     Lower body assist Assist for lower body dressing: 2 Helpers     Toileting Toileting Toileting Activity did not occur (Clothing management and hygiene only): N/A (no void or bm)  Toileting assist Assist for toileting: Total Assistance - Patient < 25%     Transfers Chair/bed transfer  Transfers assist     Chair/bed transfer assist level: 2 Helpers (slide board)     Locomotion Ambulation   Ambulation assist   Ambulation activity did not occur: Safety/medical concerns          Walk 10 feet activity   Assist  Walk 10 feet activity did not occur: Safety/medical concerns        Walk 50 feet activity   Assist Walk 50 feet with 2 turns activity did not occur: Safety/medical  concerns         Walk 150 feet activity   Assist Walk 150 feet activity did not occur: Safety/medical concerns         Walk 10 feet on uneven surface  activity   Assist Walk 10 feet on uneven surfaces activity did not occur: Safety/medical concerns         Wheelchair     Assist Is the patient using a wheelchair?: Yes Type of Wheelchair: Manual    Wheelchair assist level: Minimal Assistance - Patient > 75% Max wheelchair distance: 29'    Wheelchair 50 feet with 2 turns activity    Assist        Assist Level: Minimal Assistance - Patient > 75%   Wheelchair 150 feet activity     Assist      Assist Level: Moderate Assistance - Patient 50 - 74%   Blood pressure (!) 94/54, pulse 77, temperature 98.4 F (36.9 C), temperature source Oral, resp. rate 14, height 5\' 6"  (1.676 m), weight 126 kg, SpO2 98 %.  Medical Problem List and Plan: 1. 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 - Decadron taper             -patient may shower             -ELOS/Goals: 10-12 days, supervision goals with PT and OT  Con't CIR_ PT and OT-team conference today to determine length of stay 2.  Antithrombotics: -DVT/anticoagulation:  Pharmaceutical: Heparin.  Check vascular study             -antiplatelet therapy: Aspirin 325 mg daily 3. Pain Management: Oxycodone as needed Knee pain 2019 xray show severe OA on RIght and Moderate on Left  OA will add voltaren gel   2/20- will increase oxycodone to q6 hours prn  2/21- doing a little better- con't regimen 4. Mood: Trazodone 150 mg nightly, Xanax 0.25 mg twice daily as needed             -antipsychotic agents: N/A 5. Neuropsych: This patient is capable of making decisions on his own behalf. 6. Skin/Wound Care: Routine skin checks             -encourage appropriate nutrition              -pressure relief 7. Fluids/Electrolytes/Nutrition:               I personally reviewed the patient's labs today.                -add protein supp for low albumin 8.  Diabetes mellitus.  Hemoglobin A1c 6.0.  CBG (last 3)  Recent Labs (last 2 labs)        Recent Labs    11/30/21 1645 11/30/21 2154 12/01/21 0601  GLUCAP 154* 130* 97         2/21- CBGs controlled- con't regimen 9.  Hypertension.  Lopressor 25 mg twice daily, hydralazine 25 mg every 8 hours.      2/20- BP a little low this AM- not drinking well= probably playing a part- will con't to monitor trend  2/21- BP still low- will decrease hydralazine to 10 mg TID- also off Lopressor per chart Vitals:   12/04/21 1954 12/05/21 0523  BP: 97/61 (!) 94/54  Pulse: 79 77  Resp: 14 14  Temp: 97.9 F (36.6 C) 98.4 F (36.9 C)  SpO2: 96% 98%    10.  Hyperlipidemia.  Zocor 11.  Microcytic anemia.  Continue iron supplement.  Follow-up CBC 12.  Acute urinary retention/neurogenic bladder.  Foley tube removed.   -Presently on Flomax 0.4 mg.   -need pvr's  2/21- last cathed 2/19 early AM- but bladder scans <150 cc since then- off PVRs right now 13.  Constipation/neurogenic bowel.   -Senokot twice daily, MiraLAX daily 2/17-had small, hard bm yesterday and minimal before             Give sorbitol today             -fleet enema if needed  2/20- will give sorbitol today  2/21- LBM overnight with sorbitol.  14.  Obesity.  BMI 39.5.  Dietary follow-up 15.  History of CVA.  Modified independent with straight point cane prior to admission.  Continue aspirin 16.  Leukocytosis.  Suspect steroid-induced--improved to 9.6 2/17  2/20- WBC donw to 7.8k- con't regimen 17. AKI  2/20- Cr up to 1.56 and BUN up to 31- will recheck Wednesday and push fluids- hopefully will help BP and Cr.   2/21- labs in AM- educated pt on drinking at least 8 cups/water/day   I spent a total of  36  minutes on total care today- >50% coordination of care- due to team conference qand reviewing labs- educating pt on drinking and d/w nurse about water at bedside     LOS: 5  days A FACE TO FACE EVALUATION WAS PERFORMED  Tawnie Ehresman 12/05/2021, 9:54 AM

## 2021-12-05 NOTE — Care Management (Signed)
Inpatient [facility] Individual Statement of Services  Patient Name:  Brandi [name]  Date:  12/05/2021  Welcome to the [facility].  Our goal is to provide you with an individualized program based on your diagnosis and situation, designed to meet your specific needs.  With this comprehensive rehabilitation program, you will be expected to participate in at least 3 hours of rehabilitation therapies Monday-Friday, with modified therapy programming on the weekends.  Your rehabilitation program will include the following services:  Physical Therapy (PT), Occupational Therapy (OT), Speech Therapy (ST), 24 hour per day rehabilitation nursing, Therapeutic Recreaction (TR), Psychology, Neuropsychology, Care Coordinator, Rehabilitation Medicine, [place], and Other  Weekly team conferences will be held on Tuesdays to discuss your progress.  Your Inpatient Rehabilitation Care Coordinator will talk with you frequently to get your input and to update you on team discussions.  Team conferences with you and your family in attendance may also be held.  Expected length of stay: 21-25 days  Overall anticipated outcome:  Minimal Assistance  Depending on your progress and recovery, your program may change. Your Inpatient Rehabilitation Care Coordinator will coordinate services and will keep you informed of any changes. Your Inpatient Rehabilitation Care Coordinator's name and contact numbers are listed  below.  The following services may also be recommended but are not provided by the [facility] will be made to provide these services after discharge if needed.  Arrangements include referral to agencies that provide these services.  Your insurance has been verified to be:  [facility] Medicare  Your  primary doctor is:  Allyn Kenner  Pertinent information will be shared with your doctor and your insurance company.  Inpatient Rehabilitation Care Coordinator:  Cathleen Corti [phone] or [phone]  Information discussed with and copy given to patient by: Rana Snare, 12/05/2021, 9:47 AM

## 2021-12-05 NOTE — Progress Notes (Signed)
Occupational Therapy Session Note  Patient Details  Name: Brandi [name] MRN: [id number] Date of Birth: [birth year]  Today's Date: 12/05/2021 OT Individual Time: 0737-1062 OT Individual Time Calculation (min): 70 min    Short Term Goals: Week 1:  OT Short Term Goal 1 (Week 1): Pt will don UB clothing with CGA OT Short Term Goal 2 (Week 1): Pt will complete sit > stand with max A of 1 using LRAD OT Short Term Goal 3 (Week 1): Pt will complete bed mobility to EOB with CGA  Skilled Therapeutic Interventions/Progress Updates:    Pt resting in bed upon arrival with son present. OT intervention with focus on bed mobility, sitting balance, SB transfer, sit<>stand in [place], and activity tolerance. Pt noted with motor planning deficits and frequently requires max verbal cues for sequencing. Supine>sit EOB with max A and max verbal cues. Pt requied max A to scoot to EOB. Sitting balance with close supervison. SB transfer bed>w/c with max A and son stabilizing SB. Max verbal cues for sequencing and correct posture on SB. Roho seat cushion provided for pt. Attempted sit<>stand X 4 in [place] with max A+2. Pt unable to achieve complete upright position to enable placement of Stedy paddles. Pt able to complete reciprocal scooting in w/c with max A and max verbal cues. Pt returned to room. Belt alarm activated. All needs within reach. Son present.   Therapy Documentation Precautions:  Precautions Precautions: Fall Restrictions Weight Bearing Restrictions: No  Pain: Pt reports chronic pain in Bil knees but "feeling better" today Therapy/Group: Individual Therapy  [name] 12/05/2021, 12:02 PM

## 2021-12-05 NOTE — Consult Note (Addendum)
WOC Nurse Consult Note: Patient receiving care in [facility] 4M05. With the patient's permission, her son stayed in the room during my assessment. Reason for Consult: "boil on the butt" Wound type: The area on the left buttock is NOT a boil, nor is it a pressure injury, nor is it related to MASD - IAD or ITD.  There is no surrounding induration or erythema. It is likely an abrasion. Pressure Injury POA: NA Measurement:2.2 cm x 2 cm x no depth Wound bed: 100% clean and pink Drainage (amount, consistency, odor) none Periwound: intact Dressing procedure/placement/frequency: Use a foam dressing to cover the abrasion on the left buttock. Change every 3 days and prn soilage. Thank you for the consult.  Discussed plan of care with the patient and bedside nurse.  [place] nurse will not follow at this time.  Please re-consult the [place] team if needed.  Val Riles, RN, MSN, CWOCN, CNS-BC, pager [phone]

## 2021-12-05 NOTE — Progress Notes (Signed)
Pt restless through out the night. Constantly asking for pain medication before schedule. Pt needs constant reminders of medication schedule. Stage II on buttocks, cleansed and foam dressing applied.

## 2021-12-06 DIAGNOSIS — G959 Disease of spinal cord, unspecified: Secondary | ICD-10-CM | POA: Diagnosis not present

## 2021-12-06 LAB — GLUCOSE, CAPILLARY
Glucose-Capillary: 132 mg/dL — ABNORMAL HIGH (ref 70–99)
Glucose-Capillary: 129 mg/dL — ABNORMAL HIGH (ref 70–99)
Glucose-Capillary: 106 mg/dL — ABNORMAL HIGH (ref 70–99)
Glucose-Capillary: 142 mg/dL — ABNORMAL HIGH (ref 70–99)

## 2021-12-06 LAB — BASIC METABOLIC PANEL
Anion gap: 10 (ref 5–15)
BUN: 29 mg/dL — ABNORMAL HIGH (ref 8–23)
CO2: 22 mmol/L (ref 22–32)
Calcium: 9 mg/dL (ref 8.9–10.3)
Chloride: 98 mmol/L (ref 98–111)
Creatinine, Ser: 1.36 mg/dL — ABNORMAL HIGH (ref 0.44–1.00)
GFR, Estimated: 43 mL/min — ABNORMAL LOW (ref >60)
Glucose, Bld: 118 mg/dL — ABNORMAL HIGH (ref 70–99)
Potassium: 4.7 mmol/L (ref 3.5–5.1)
Sodium: 130 mmol/L — ABNORMAL LOW (ref 135–145)

## 2021-12-06 NOTE — Progress Notes (Signed)
PROGRESS NOTE   Subjective/Complaints: Pt reports they got blood from her this AM.  No issues.   BUN down slightly to 29 and Cr down to 1.36 ROS-   Pt denies SOB, abd pain, CP, N/V/C/D, and vision changes   Objective:   No results found. Recent Labs    12/04/21 0549  WBC 7.8  HGB 11.3*  HCT 37.9  PLT 360   Recent Labs    12/04/21 0549 12/06/21 0521  NA 133* 130*  K 4.8 4.7  CL 100 98  CO2 23 22  GLUCOSE 121* 118*  BUN 31* 29*  CREATININE 1.56* 1.36*  CALCIUM 8.9 9.0    Intake/Output Summary (Last 24 hours) at 12/06/2021 0839 Last data filed at 12/06/2021 0700 Gross per 24 hour  Intake 1560 ml  Output --  Net 1560 ml       Physical Exam: Vital Signs Blood pressure (!) 91/54, pulse 78, temperature 98 F (36.7 C), temperature source Oral, resp. rate 17, height 5\' 6"  (1.676 m), weight 126 kg, SpO2 99 %.  BP 91/54  General: awake, alert, appropriate, sitting up in bed; NAD HENT: conjugate gaze; oropharynx moist CV: regular rate; no JVD Pulmonary: CTA B/L; no W/R/R- good air movement GI: soft, NT, ND, (+)BS Psychiatric: appropriate Neurological: Ox3  Extremities: No clubbing, cyanosis, or edema Skin: No evidence of breakdown, no evidence of rash- has abrasion on L buttock- not pressure Neurologic: Cranial nerves II through XII intact, motor strength is /5 in bilateral deltoid, bicep, tricep, grip, 2- bilateral hip flexor, knee extensors, ankle dorsiflexor and plantar flexor Sensory exam parasthesia to LT in BLE Cerebellar exam normal finger to nose to finger as well as heel to shin in bilateral upper and lower extremities Musculoskeletal: Pain with Bilateral knee ROM, no knee effusion or tenderness     Assessment/Plan: 1. Functional deficits which require 3+ hours per day of interdisciplinary therapy in a comprehensive inpatient rehab setting. Physiatrist is providing close team supervision and  24 hour management of active medical problems listed below. Physiatrist and rehab team continue to assess barriers to discharge/monitor patient progress toward functional and medical goals  Care Tool:  Bathing    Body parts bathed by patient: Right arm, Left arm, Chest, Abdomen, Front perineal area, Right upper leg, Left upper leg, Face   Body parts bathed by helper: Buttocks, Right lower leg, Left lower leg     Bathing assist Assist Level: Maximal Assistance - Patient 24 - 49%     Upper Body Dressing/Undressing Upper body dressing   What is the patient wearing?: Pull over shirt    Upper body assist Assist Level: Moderate Assistance - Patient 50 - 74%    Lower Body Dressing/Undressing Lower body dressing      What is the patient wearing?: Underwear/pull up     Lower body assist Assist for lower body dressing: 2 Helpers     Toileting Toileting Toileting Activity did not occur (Clothing management and hygiene only): N/A (no void or bm)  Toileting assist Assist for toileting: Total Assistance - Patient < 25%     Transfers Chair/bed transfer  Transfers assist     Chair/bed transfer  assist level: 2 Helpers (slide board)     Locomotion Ambulation   Ambulation assist   Ambulation activity did not occur: Safety/medical concerns          Walk 10 feet activity   Assist  Walk 10 feet activity did not occur: Safety/medical concerns        Walk 50 feet activity   Assist Walk 50 feet with 2 turns activity did not occur: Safety/medical concerns         Walk 150 feet activity   Assist Walk 150 feet activity did not occur: Safety/medical concerns         Walk 10 feet on uneven surface  activity   Assist Walk 10 feet on uneven surfaces activity did not occur: Safety/medical concerns         Wheelchair     Assist Is the patient using a wheelchair?: Yes Type of Wheelchair: Manual    Wheelchair assist level: Minimal Assistance -  Patient > 75% Max wheelchair distance: 25'    Wheelchair 50 feet with 2 turns activity    Assist        Assist Level: Minimal Assistance - Patient > 75%   Wheelchair 150 feet activity     Assist      Assist Level: Moderate Assistance - Patient 50 - 74%   Blood pressure (!) 91/54, pulse 78, temperature 98 F (36.7 C), temperature source Oral, resp. rate 17, height 5\' 6"  (1.676 m), weight 126 kg, SpO2 99 %.  Medical Problem List and Plan: 1. 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 - Decadron taper             -patient may shower             -ELOS/Goals: 10-12 days, supervision goals with PT and OT  Con't CIR- d/c 3/14- PT and OT 2.  Antithrombotics: -DVT/anticoagulation:  Pharmaceutical: Heparin.  Check vascular study             -antiplatelet therapy: Aspirin 325 mg daily 3. 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 Mood: Trazodone 150 mg nightly, Xanax 0.25 mg twice daily as needed             -antipsychotic agents: N/A 5. Neuropsych: This patient is capable of making decisions on his own behalf. 6. Skin/Wound Care: Routine skin checks             -encourage appropriate nutrition              -pressure relief 7. Fluids/Electrolytes/Nutrition:               I personally reviewed the patient's labs today.               -add protein supp for low albumin 8.  Diabetes mellitus.  Hemoglobin A1c 6.0.                CBG (last 3)  Recent Labs (last 2 labs)        Recent Labs    11/30/21 1645 11/30/21 2154 12/01/21 0601  GLUCAP 154* 130* 97         2/21- CBGs controlled- con't regimen 9.  Hypertension.  Lopressor 25 mg twice daily, hydralazine 25 mg every 8 hours.      2/20- BP  a little low this AM- not drinking well= probably playing a part- will con't to monitor  trend  2/22- will stop hydralazine- BP 91/54 this AM- and 95/57 last night-  Vitals:   12/05/21 2020 12/06/21 0505  BP: (!) 95/57 (!) 91/54  Pulse: 77 78  Resp:  17  Temp: 98 F (36.7 C) 98 F (36.7 C)  SpO2: 99% 99%    10.  Hyperlipidemia.  Zocor 11.  Microcytic anemia.  Continue iron supplement.  Follow-up CBC 12.  Acute urinary retention/neurogenic bladder.  Foley tube removed.   -Presently on Flomax 0.4 mg.   -need pvr's  2/21- last cathed 2/19 early AM- but bladder scans <150 cc since then- off PVRs right now 13.  Constipation/neurogenic bowel.   -Senokot twice daily, MiraLAX daily 2/17-had small, hard bm yesterday and minimal before             Give sorbitol today             -fleet enema if needed  2/20- will give sorbitol today  2/21- LBM overnight with sorbitol.  14.  Obesity.  BMI 39.5.  Dietary follow-up 15.  History of CVA.  Modified independent with straight point cane prior to admission.  Continue aspirin 16.  Leukocytosis.  Suspect steroid-induced--improved to 9.6 2/17  2/20- WBC donw to 7.8k- con't regimen 17. AKI  2/20- Cr up to 1.56 and BUN up to 31- will recheck Wednesday and push fluids- hopefully will help BP and Cr.   2/21- labs in AM- educated pt on drinking at least 8 cups/water/day  2/2- pt drinking a lot- Cr down to 1.36 and Bun down very slightly to 29- will recheck Monday and if not better, will give IVFs.   18. Abrasion L buttock  2/22- doesn' thave pressure ulcer- per my exam nor WOC- will con't wound care.    I spent a total of 36   minutes on total care today- >50% coordination of care- due to d/w [place] and nursing.         LOS: 6 days A FACE TO FACE EVALUATION WAS PERFORMED  Brandi [name] 12/06/2021, 8:39 AM

## 2021-12-06 NOTE — Progress Notes (Signed)
Occupational Therapy Weekly Progress Note  Patient Details  Name: Brandi [name] MRN: [id number] Date of Birth: 09/19/1956  Beginning of progress report period: December 01, 2021 End of progress report period: December 06, 2021  Patient has met 0 of 3 short term goals.  Pt progress has been slow but steady this week. Pt requires mod A for supine>sit EOB and CGA/supervision for sitting balance. Bathing/dressing at bed level. LB bathing/dressing with max A. UB bathing/dressing with min A. SB transfers with min A to level surface. Pt currently using bedpan for toileting. Pt has been unable to stand in [place] with OT. Pt with slight motor planning deficits from CVA 12 years ago. Pt's son has been present and observed therapy sessions.   Patient continues to demonstrate the following deficits: muscle weakness, decreased cardiorespiratoy endurance, impaired timing and sequencing and unbalanced muscle activation, and decreased sitting balance, decreased standing balance, decreased postural control, and decreased balance strategies and therefore will continue to benefit from skilled OT intervention to enhance overall performance with BADL and Reduce care partner burden.  Patient progressing toward long term goals..  Continue plan of care.  OT Short Term Goals Week 1:  OT Short Term Goal 1 (Week 1): Pt will don UB clothing with CGA OT Short Term Goal 1 - Progress (Week 1): Progressing toward goal OT Short Term Goal 2 (Week 1): Pt will complete sit > stand with max A of 1 using LRAD OT Short Term Goal 2 - Progress (Week 1): Progressing toward goal OT Short Term Goal 3 (Week 1): Pt will complete bed mobility to EOB with CGA OT Short Term Goal 3 - Progress (Week 1): Progressing toward goal Week 2:  OT Short Term Goal 1 (Week 2): Pt will don UB clothing with CGA OT Short Term Goal 2 (Week 2): Pt will complete sit > stand with max A of 1 using LRAD OT Short Term Goal 3 (Week 2): Pt will complete bed  mobility to EOB with CGA       [name] 12/06/2021, 2:58 PM

## 2021-12-06 NOTE — Progress Notes (Signed)
Physical Therapy Session Note  Patient Details  Name: Brandi [name] MRN: [id number] Date of Birth: [birth year]  Today's Date: 12/06/2021 PT Individual Time: 1300-1410 PT Individual Time Calculation (min): 70 min   Short Term Goals: Week 1:  PT Short Term Goal 1 (Week 1): Pt will transfer to [facility] with Slide board and max assist PT Short Term Goal 2 (Week 1): Pt will performed sit<>stand in parallel bars with max assist +2 PT Short Term Goal 3 (Week 1): Pt will propel WC 141ft with supervision assist PT Short Term Goal 4 (Week 1): pt will tolerate sitting up in WC >2 hours between therapies in WC PT Short Term Goal 5 - Progress (Week 1): Revised due to lack of progress  Skilled Therapeutic Interventions/Progress Updates:    Pt received seated in w/c in room, agreeable to PT session. Pt reports pain in her buttocks from sitting up in chair, agreeable to participate in therapy session and get out of chair. Dependent transport via w/c to/from therapy gym for time and energy conservation. Slide board transfer w/c to mat table with max A x 1 and min A x 1. Sit to stand to stedy from elevated mat table with max A x 2 with several attempts required before pt able to achieve standing. Pt does remain in semi-perched position in standing with hip, knee, and trunk flexion. Pt is able to achieve full, upright stance from perched position on stedy seat x 2 reps with mod A. Stedy transfer back to w/c onto standing frame sling. Sit to stand in standing frame x 2 reps, pt able to tolerate standing up to 5 min each repetition. While in standing pt able to reach outside BOS and across midline with alt UE for targets. Pt also able to perform mini-squats 2 x 10 reps in standing frame with increased time and cues needed to perform exercise correctly. Pt requests to return to bed at end of session. Slide board transfer w/c to bed with max A x 1 and min A x 1 for balance. Sit to supine mod A needed for BLE management. Pt  left seated in bed with needs in reach, bed alarm in place at end of session.  Therapy Documentation Precautions:  Precautions Precautions: Fall Restrictions Weight Bearing Restrictions: No       Therapy/Group: Individual Therapy   Excell Seltzer, PT, DPT, CSRS  12/06/2021, 5:53 PM

## 2021-12-06 NOTE — Progress Notes (Signed)
Physical Therapy Session Note  Patient Details  Name: Brandi [name] MRN: [id number] Date of Birth: 07/16/1956  Today's Date: 12/06/2021 PT Individual Time: 0805-0915 PT Individual Time Calculation (min): 70 min   Short Term Goals: Week 1:  PT Short Term Goal 1 (Week 1): Pt will transfer to [facility] with Slide board and max assist PT Short Term Goal 2 (Week 1): Pt will performed sit<>stand in parallel bars with max assist +2 PT Short Term Goal 3 (Week 1): Pt will propel WC 124f with supervision assist PT Short Term Goal 4 (Week 1): pt will tolerate sitting up in WC >2 hours between therapies in WC PT Short Term Goal 5 - Progress (Week 1): Revised due to lack of progress  Skilled Therapeutic Interventions/Progress Updates: Pt presented in bed agreeable to therapy. Pt states pain 7/10 recently premedicated. Pt requesting to change clothes. Pt recently with brief change and was clean/dry. PTA threaded pants total A and pt performed rolling L/R with minA to allow PTA to pull pants over hips. Pt then transitioned to long sit with minA and was able to wash upper body with minA for cleaning L axilla as well as PTA providing intermittent guarding for truncal support. Pt was able to don shirt and bra with minA overall. After brief rest pt then performed supine to sit with HOB elevated and modA. Pt set up in [place] from [place] bed performed Sit to stand with maxA x 2. Pt noted to lean forward on bar instead of pushing through legs to allow good clearance for hips to be placed on St[place] plates. Once pt in [place] moved over to mirror and had pt performed additional x2 stands in [place] feedback and cues to look "up" instead of leaning forward. Although pt required heavy ModA from [place] pt was able to achieve improved erect posture for a few seconds before leaning forward. Pt then performed one last stand in [place] to transfer to w/c with pt able to stand long enough to allow PTA to clear  plate easily. Pt then transferred to day room and participated in Cybex Kinetron 80cm/sec for 2 min then 3 additional bouts x 1 min each. Pt encouraged to push RLE through full range with each round. Pt transported back to room at end of session and remained in w/c with belt alarm on, call bell within reach and needs met.      Therapy Documentation Precautions:  Precautions Precautions: Fall Restrictions Weight Bearing Restrictions: No General:   Vital Signs: Therapy Vitals Temp: (!) 97.5 F (36.4 C) Temp Source: Oral Pulse Rate: 73 Resp: 18 BP: 105/62 Patient Position (if appropriate): Lying Oxygen Therapy SpO2: 97 % O2 Device: Room Air Pain: Pain Assessment Pain Score: 10-Worst pain ever Mobility:   Locomotion :    Trunk/Postural Assessment :    Balance:   Exercises:   Other Treatments:      Therapy/Group: Individual Therapy  Rawson Minix 12/06/2021, 4:20 PM

## 2021-12-06 NOTE — Progress Notes (Signed)
Occupational Therapy Session Note  Patient Details  Name: Brandi [name] MRN: [id number] Date of Birth: 1956-07-19  Today's Date: 12/06/2021 OT Individual Time: 0930-1040 OT Individual Time Calculation (min): 70 min    Short Term Goals: Week 1:  OT Short Term Goal 1 (Week 1): Pt will don UB clothing with CGA OT Short Term Goal 2 (Week 1): Pt will complete sit > stand with max A of 1 using LRAD OT Short Term Goal 3 (Week 1): Pt will complete bed mobility to EOB with CGA  Skilled Therapeutic Interventions/Progress Updates:    Pt resting in w/c upon arrival and agreeable to going to gym. OT intervention with focus on sit<>stand in [place] and BUE therex. Attempted sit<>stand in [place] X 4 but pt unable to push up to upright postiion. Pt reports her legs are weak from earlier session. BUE therex with 2# bar per below (3 sets);  Chest presses X 10 Ball taps x 10 Biceps curls x 10  W/c mobility with superviision 50'  Pt returned to room and remained in w/c with all needs within reach. Belt alarm activated.   Therapy Documentation Precautions:  Precautions Precautions: Fall Restrictions Weight Bearing Restrictions: No  Pain: Pt reports Bil knee pain (unrated); emotional support, premedicated   Therapy/Group: Individual Therapy  [name] 12/06/2021, 10:57 AM

## 2021-12-07 DIAGNOSIS — G959 Disease of spinal cord, unspecified: Secondary | ICD-10-CM | POA: Diagnosis not present

## 2021-12-07 LAB — GLUCOSE, CAPILLARY
Glucose-Capillary: 112 mg/dL — ABNORMAL HIGH (ref 70–99)
Glucose-Capillary: 112 mg/dL — ABNORMAL HIGH (ref 70–99)
Glucose-Capillary: 93 mg/dL (ref 70–99)
Glucose-Capillary: 118 mg/dL — ABNORMAL HIGH (ref 70–99)

## 2021-12-07 MED ORDER — SORBITOL 70 % SOLN: 30.0000 mL | Freq: Once | Status: DC

## 2021-12-07 MED ORDER — OXYCODONE HCL 5 MG PO TABS
5.0000 mg | ORAL_TABLET | ORAL | Status: DC | PRN
  Administered 2021-12-07 – 2021-12-26 (×69): 5 mg via ORAL
  Filled 2021-12-07 (×73): qty 1

## 2021-12-07 MED ORDER — SENNA 8.6 MG PO TABS
2.0000 | ORAL_TABLET | Freq: Two times a day (BID) | ORAL | Status: DC
  Administered 2021-12-08 – 2021-12-11 (×3): 17.2 mg via ORAL
  Filled 2021-12-07 (×7): qty 2

## 2021-12-07 NOTE — Progress Notes (Signed)
Physical Therapy Session Note  Patient Details  Name: Brandi [name] MRN: [id number] Date of Birth: [birth year]  Today's Date: 12/07/2021 PT Individual Time: 0905-1003 PT Individual Time Calculation (min): 58 min   Short Term Goals: Week 1:  PT Short Term Goal 1 (Week 1): Pt will transfer to [facility] with Slide board and max assist PT Short Term Goal 2 (Week 1): Pt will performed sit<>stand in parallel bars with max assist +2 PT Short Term Goal 3 (Week 1): Pt will propel WC 165ft with supervision assist PT Short Term Goal 4 (Week 1): pt will tolerate sitting up in WC >2 hours between therapies in WC PT Short Term Goal 5 - Progress (Week 1): Revised due to lack of progress  Skilled Therapeutic Interventions/Progress Updates: Pt presented in bed agreeable to therapy. NT just left room completing peri-care and ortho team dropped off soft ankle brace.  Pt states improved pain in B knees. PTA threaded pants total A for time management.  Pt performed rolling L/R with minA to allow PTA to pull pants over hips. Pt then performed supine to sit with modA and heavy use of bed features. With bed elevated pt attempted Sit to stand in [place]. Pt was unable to achieve full stand but PTA was able to shift hips to place plates behind pt. Pt then transferred to w/c and was and although unable to achieve full stand pt was able to improve stand to remove plates to allow pt to sit in w/c. PTA then placed socks, soft ankle brace, and shoes. Pt then transported to ortho gym and attempted to perform additional sit to stands with mirror feedback however was unable to power up despite total A x 2. Pt then transported back to room and remained in w/c with belt alarm on, call bell within reach and needs met.      Therapy Documentation Precautions:  Precautions Precautions: Fall Restrictions Weight Bearing Restrictions: No General:   Vital Signs: Therapy Vitals Temp: 98.1 F (36.7 C) Pulse Rate: 83 Resp: 18 BP: (!)  91/40 Patient Position (if appropriate): Lying Oxygen Therapy SpO2: 96 % O2 Device: Room Air Pain:   Mobility:   Locomotion :    Trunk/Postural Assessment :    Balance:   Exercises:   Other Treatments:      Therapy/Group: Individual Therapy  Ivonna Kinnick 12/07/2021, 4:10 PM

## 2021-12-07 NOTE — Progress Notes (Signed)
PROGRESS NOTE   Subjective/Complaints:  Drinking 8 cups/day-says dirnking a lot of water.   LBM 3 days ago. Miralax doesn't work, so wants senna increased.   Otherwise, things OK/no issues.   ROS-   Pt denies SOB, abd pain, CP, N/V/(+)C/D, and vision changes    Objective:   No results found. No results for input(s): WBC, HGB, HCT, PLT in the last 72 hours.  Recent Labs    12/06/21 0521  NA 130*  K 4.7  CL 98  CO2 22  GLUCOSE 118*  BUN 29*  CREATININE 1.36*  CALCIUM 9.0    Intake/Output Summary (Last 24 hours) at 12/07/2021 0840 Last data filed at 12/06/2021 1824 Gross per 24 hour  Intake 480 ml  Output --  Net 480 ml       Physical Exam: Vital Signs Blood pressure (!) 96/57, pulse 79, temperature 98.1 F (36.7 C), temperature source Oral, resp. rate 18, height 5\' 6"  (1.676 m), weight 126 kg, SpO2 98 %.    General: awake, alert, appropriate, sitting up slightly in bed; NAD HENT: conjugate gaze; oropharynx moist CV: regular rate; no JVD Pulmonary: CTA B/L; no W/R/R- good air movement GI: soft, NT, ND, (+)BS- hypoactive Psychiatric: appropriate Neurological: Ox3 Skin- mild tenting noted on exam Extremities: No clubbing, cyanosis, or edema Skin: No evidence of breakdown, no evidence of rash- has abrasion on L buttock- not pressure Neurologic: Cranial nerves II through XII intact, motor strength is /5 in bilateral deltoid, bicep, tricep, grip, 2- bilateral hip flexor, knee extensors, ankle dorsiflexor and plantar flexor Sensory exam parasthesia to LT in BLE Cerebellar exam normal finger to nose to finger as well as heel to shin in bilateral upper and lower extremities Musculoskeletal: Pain with Bilateral knee ROM, no knee effusion or tenderness     Assessment/Plan: 1. Functional deficits which require 3+ hours per day of interdisciplinary therapy in a comprehensive inpatient rehab  setting. Physiatrist is providing close team supervision and 24 hour management of active medical problems listed below. Physiatrist and rehab team continue to assess barriers to discharge/monitor patient progress toward functional and medical goals  Care Tool:  Bathing    Body parts bathed by patient: Right arm, Left arm, Chest, Abdomen, Front perineal area, Right upper leg, Left upper leg, Face   Body parts bathed by helper: Buttocks, Right lower leg, Left lower leg     Bathing assist Assist Level: Maximal Assistance - Patient 24 - 49%     Upper Body Dressing/Undressing Upper body dressing   What is the patient wearing?: Pull over shirt    Upper body assist Assist Level: Moderate Assistance - Patient 50 - 74%    Lower Body Dressing/Undressing Lower body dressing      What is the patient wearing?: Underwear/pull up     Lower body assist Assist for lower body dressing: 2 Helpers     Toileting Toileting Toileting Activity did not occur (Clothing management and hygiene only): N/A (no void or bm)  Toileting assist Assist for toileting: Total Assistance - Patient < 25%     Transfers Chair/bed transfer  Transfers assist     Chair/bed transfer assist level: 2 Helpers (  slide board)     Locomotion Ambulation   Ambulation assist   Ambulation activity did not occur: Safety/medical concerns          Walk 10 feet activity   Assist  Walk 10 feet activity did not occur: Safety/medical concerns        Walk 50 feet activity   Assist Walk 50 feet with 2 turns activity did not occur: Safety/medical concerns         Walk 150 feet activity   Assist Walk 150 feet activity did not occur: Safety/medical concerns         Walk 10 feet on uneven surface  activity   Assist Walk 10 feet on uneven surfaces activity did not occur: Safety/medical concerns         Wheelchair     Assist Is the patient using a wheelchair?: Yes Type of Wheelchair:  Manual    Wheelchair assist level: Minimal Assistance - Patient > 75% Max wheelchair distance: 3'    Wheelchair 50 feet with 2 turns activity    Assist        Assist Level: Minimal Assistance - Patient > 75%   Wheelchair 150 feet activity     Assist      Assist Level: Moderate Assistance - Patient 50 - 74%   Blood pressure (!) 96/57, pulse 79, temperature 98.1 F (36.7 C), temperature source Oral, resp. rate 18, height 5\' 6"  (1.676 m), weight 126 kg, SpO2 98 %.  Medical Problem List and Plan: 1. 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 - Decadron taper             -patient may shower             -ELOS/Goals: 10-12 days, supervision goals with PT and OT  D/c 3/14  Con't CIR_ PT and OT 2.  Antithrombotics: -DVT/anticoagulation:  Pharmaceutical: Heparin.  Check vascular study             -antiplatelet therapy: Aspirin 325 mg daily 3. Pain Management: Oxycodone as needed Knee pain 2019 xray show severe OA on RIght and Moderate on Left  OA will add voltaren gel   2/20- will increase oxycodone to q6 hours prn  2/23- will change oxy to q4 hours prn to help with increased pain 4. Mood: Trazodone 150 mg nightly, Xanax 0.25 mg twice daily as needed             -antipsychotic agents: N/A 5. Neuropsych: This patient is capable of making decisions on his own behalf. 6. Skin/Wound Care: Routine skin checks             -encourage appropriate nutrition              -pressure relief 7. Fluids/Electrolytes/Nutrition:               I personally reviewed the patient's labs today.               -add protein supp for low albumin 8.  Diabetes mellitus.  Hemoglobin A1c 6.0.                CBG (last 3)       2/23- CBGs 106-142- con't regimen 9.  Hypertension.  Lopressor 25 mg twice daily, hydralazine 25 mg every 8 hours.      2/20- BP a little low this AM- not drinking well= probably playing a part- will con't to monitor trend  2/22- will stop hydralazine- BP 91/54  this AM- and  95/57 last night-   2/23- BP still 90s/50s- off all BP meds Vitals:   12/06/21 1946 12/07/21 0317  BP: (!) 93/59 (!) 96/57  Pulse: 81 79  Resp: 20 18  Temp: 98.7 F (37.1 C) 98.1 F (36.7 C)  SpO2: 100% 98%    10.  Hyperlipidemia.  Zocor 11.  Microcytic anemia.  Continue iron supplement.  Follow-up CBC 12.  Acute urinary retention/neurogenic bladder.  Foley tube removed.   -Presently on Flomax 0.4 mg.   -need pvr's  2/21- last cathed 2/19 early AM- but bladder scans <150 cc since then- off PVRs right now 13.  Constipation/neurogenic bowel.   -Senokot twice daily, MiraLAX daily 2/17-had small, hard bm yesterday and minimal before             Give sorbitol today             -fleet enema if needed  2/20- will give sorbitol today  2/21- LBM overnight with sorbitol.  2/23- will increase senna to 2 tabs BID- and try Sorbitol again today per pt request 14.  Obesity.  BMI 39.5.  Dietary follow-up 15.  History of CVA.  Modified independent with straight point cane prior to admission.  Continue aspirin 16.  Leukocytosis.  Suspect steroid-induced--improved to 9.6 2/17  2/20- WBC down to 7.8k- con't regimen 17. AKI  2/20- Cr up to 1.56 and BUN up to 31- will recheck Wednesday and push fluids- hopefully will help BP and Cr.   2/21- labs in AM- educated pt on drinking at least 8 cups/water/day  2/23- push fluids- and recheck BMP in AM 18. Abrasion L buttock  2/22- doesn' thave pressure ulcer- per my exam nor WOC- will con't wound care.    I spent a total of  36  minutes on total care today- >50% coordination of care- due to d/w pt about bowels and follow up on BMP/elevated Cr/BUN       LOS: 7 days A FACE TO FACE EVALUATION WAS PERFORMED  Danilyn Cocke 12/07/2021, 8:40 AM

## 2021-12-07 NOTE — Progress Notes (Signed)
Occupational Therapy Session Note  Patient Details  Name: Brandi [name] MRN: [id number] Date of Birth: 01/24/1956  Today's Date: 12/08/2021 OT Individual Time: 6720-9470 OT Individual Time Calculation (min): 74 min   Short Term Goals: Week 1:  OT Short Term Goal 1 (Week 1): Pt will don UB clothing with CGA OT Short Term Goal 1 - Progress (Week 1): Progressing toward goal OT Short Term Goal 2 (Week 1): Pt will complete sit > stand with max A of 1 using LRAD OT Short Term Goal 2 - Progress (Week 1): Progressing toward goal OT Short Term Goal 3 (Week 1): Pt will complete bed mobility to EOB with CGA OT Short Term Goal 3 - Progress (Week 1): Progressing toward goal  Skilled Therapeutic Interventions/Progress Updates:    Pt greeted in the w/c, agreeable to session. Stated she had received a lot of laxative medication today. Agreeable to attempt slideboard<drop arm BSC. Pt able to complete stated transfer with Mod A of 1 and CGA of 2nd helper. Note that pt has some motor planning deficits and required multimodal cues for carryover of transfer instruction. Pt able to lower clothing by leaning with 1 helper and Mod A, mod cues while 2nd helper assisted with lowering clothing over hips. Difficult for pt to perform clothing mgt herself at this time due to body habitus. Pt able to have a successful BM. Pt leaned forward with 1 assist for safety while 2nd helper performed perihygiene. Pt unable to reach buttocks on her own. Heavy +2 assist for clothing mgt using lateral leans and small push ups using the toilet bars, pt required multimodal cues for technique. Slideboard<w/c completed with the same assistance as written above. Increased time and multimodal cues for reciprocal scooting back in the w/c. Informed nursing that pt would like assistance returning to bed. Left pt in the w/c with all needs within reach and safety belt fastened. Tx focus placed on adaptive self care skills, functional transfers, and  postural control.   Therapy Documentation Precautions:  Precautions Precautions: Fall Restrictions Weight Bearing Restrictions: No Pain:   ADL: ADL Eating: Supervision/safety Where Assessed-Eating: Bed level Grooming: Supervision/safety Where Assessed-Grooming: Edge of bed Upper Body Bathing: Minimal assistance Where Assessed-Upper Body Bathing: Edge of bed Lower Body Bathing: Moderate assistance Where Assessed-Lower Body Bathing: Bed level Upper Body Dressing: Minimal cueing, Moderate assistance Where Assessed-Upper Body Dressing: Edge of bed Lower Body Dressing: Dependent Where Assessed-Lower Body Dressing: Bed level Toileting: Unable to assess Toilet Transfer: Unable to assess Therapy/Group: Individual Therapy  Zen Felling A Cortana Vanderford 12/08/2021, 4:19 PM

## 2021-12-07 NOTE — Progress Notes (Signed)
Orthopedic Tech Progress Note Patient Details:  Brandi [name] January 22, 1956 [id number]  Ortho Devices Type of Ortho Device: ASO Ortho Device/Splint Location: rLE Ortho Device/Splint Interventions: Ordered      [name] [name] 12/07/2021, 9:22 AM

## 2021-12-07 NOTE — Progress Notes (Signed)
Occupational Therapy Session Note  Patient Details  Name: Brandi [name] MRN: [id number] Date of Birth: 1955/10/24  Today's Date: 12/07/2021 OT Individual Time: 6269-4854 session 1 OT Individual Time Calculation (min): 53 min  Session 2: 1300-1410 ( 70 mins)   Short Term Goals: Week 2:  OT Short Term Goal 1 (Week 2): Pt will don UB clothing with CGA OT Short Term Goal 2 (Week 2): Pt will complete sit > stand with max A of 1 using LRAD OT Short Term Goal 3 (Week 2): Pt will complete bed mobility to EOB with CGA  Skilled Therapeutic Interventions/Progress Updates:  Session 1: Pt greeted seated in w/c  agreeable to OT intervention. Session focus on functional mobility, and BUE/BLE strengthening and endurance and decreasing overall caregiver burden.pt denied need for ADLs, total A transport to gym from w/c.   Pt completed below therex to increase BUE strength and endurance with 2 lb dowel rod:     X10 bicep curls X10 rows X10 OH presses  Attempted standing in sara plus lift, with unsuccessful attempts as lift only lifting pts UB despite rearranging belt. Pt completed seated UB/LB therex as indicated below: 2x10 seated marches with Static hold of 2 lb dowel rod with shoulders at 90* sh flexion  Ball taps with 2lb dowel rod for 2 mins  Of note Pt needs MAX multimodal cues to sequence correct body mechanics for all therex  Pt transported back to room with total A where pt left seated in w/c with alarm belt activated and all needs within reach.                       Session 2: Pt greeted seated in w/c  agreeable to OT intervention. Session focus on standing tolerance,  LB strengthening upright posture, cognition and decreasing caregiver burden. Pt transported to gym with total A where OTA utilized standing frame. Pt able to stand for 5 mins while pt engaged in therapeutic activity of sorting cards using R and LUE to reach out of BOS for cards to work on RUE coordination and standing balance.  Pt needed OTA to hold LUE as pt wanting to reach with LUE only. Pt needed MOD cues to organize cards correctly as pt needed assist to determining the "9" vs the "6." Pt needed seated rest break ~ 3 mins then able to stand an additional 5 mins to play card game of "21." Pt needed increased time to problem solve how many cards pt had. Pt reports pain in BLEs after last standing trial declining further standing. Pt transported back to room with total A where pt was able to stand to stedy with MAX A +2 efforts. Total A transport to EOB, wanted to work on additional sit<>stands from seat of stedy with pt reporting pain. MOD A +2 to stand from seat of stedy and return to EOB. MAX A +2 for sit>supine needing assist to elevate BLEs back bed. Rolled pt on her L side with MOD A as pt c/o pain in buttock.   pt left supine in bed with all needs within reach and bed alarm activated.                 Therapy Documentation Precautions:  Precautions Precautions: Fall Restrictions Weight Bearing Restrictions: No  Pain: session 1: no pain reported during session as pain meds were not given  Session 2: unrated pain reported in RLE and buttock, rest breaks provided and repositioning in bed provided  to off load glutes from supine.     Therapy/Group: Individual Therapy  Precious Haws 12/07/2021, 12:10 PM

## 2021-12-08 DIAGNOSIS — G959 Disease of spinal cord, unspecified: Secondary | ICD-10-CM | POA: Diagnosis not present

## 2021-12-08 LAB — BASIC METABOLIC PANEL
Anion gap: 9 (ref 5–15)
BUN: 29 mg/dL — ABNORMAL HIGH (ref 8–23)
CO2: 22 mmol/L (ref 22–32)
Calcium: 9.3 mg/dL (ref 8.9–10.3)
Chloride: 102 mmol/L (ref 98–111)
Creatinine, Ser: 1.37 mg/dL — ABNORMAL HIGH (ref 0.44–1.00)
GFR, Estimated: 43 mL/min — ABNORMAL LOW (ref >60)
Glucose, Bld: 121 mg/dL — ABNORMAL HIGH (ref 70–99)
Potassium: 5.1 mmol/L (ref 3.5–5.1)
Sodium: 133 mmol/L — ABNORMAL LOW (ref 135–145)

## 2021-12-08 LAB — GLUCOSE, CAPILLARY
Glucose-Capillary: 127 mg/dL — ABNORMAL HIGH (ref 70–99)
Glucose-Capillary: 122 mg/dL — ABNORMAL HIGH (ref 70–99)
Glucose-Capillary: 127 mg/dL — ABNORMAL HIGH (ref 70–99)
Glucose-Capillary: 98 mg/dL (ref 70–99)

## 2021-12-08 MED ORDER — SODIUM CHLORIDE 0.9 % IV SOLN: INTRAVENOUS | Status: AC

## 2021-12-08 NOTE — Progress Notes (Signed)
I stop by and saw her today and examined her.  She was sitting in a wheelchair.  She is basically stable.  Hip flexors 4 out of 5 bilaterally, knee extensors 4 out of 5 bilaterally, left dorsiflexor 4+ out of 5, right dorsiflexor 3 out of 5, plantarflexion is good bilaterally.  She has 2+ reflexes with no clonus.  Still not strong enough to stand.  The question remains is the etiology of her weakness her thoracic spinal stenosis?  It is difficult to know but I once again mention to her today that an option is to do a thoracic laminectomy at T4 and T5 to decompress the canal.  It may or may not help her weakness.  It is difficult to know if she fully understands the implications of surgery versus continued observation.  She is certainly much stronger than when she first came to the hospital.  Continue therapy for now.

## 2021-12-08 NOTE — Plan of Care (Signed)
°  Problem: Consults Goal: RH GENERAL PATIENT EDUCATION Description: See Patient Education module for education specifics. Outcome: Progressing   Problem: RH BOWEL ELIMINATION Goal: RH STG MANAGE BOWEL WITH ASSISTANCE Description: STG Manage Bowel with Supervision Assistance. Outcome: Progressing Goal: RH STG MANAGE BOWEL W/MEDICATION W/ASSISTANCE Description: STG Manage Bowel with Medication with Supervision Assistance. Outcome: Progressing   Problem: RH BLADDER ELIMINATION Goal: RH STG MANAGE BLADDER WITH ASSISTANCE Description: STG Manage Bladder With Supervision Assistance Outcome: Progressing Goal: RH STG MANAGE BLADDER WITH MEDICATION WITH ASSISTANCE Description: STG Manage Bladder With Medication With Supervision Assistance. Outcome: Progressing   Problem: RH SKIN INTEGRITY Goal: RH STG MAINTAIN SKIN INTEGRITY WITH ASSISTANCE Description: STG Maintain Skin Integrity With Supervision Assistance. Outcome: Progressing   Problem: RH SAFETY Goal: RH STG ADHERE TO SAFETY PRECAUTIONS W/ASSISTANCE/DEVICE Description: STG Adhere to Safety Precautions With Cues and Reminders. Outcome: Progressing Goal: RH STG DECREASED RISK OF FALL WITH ASSISTANCE Description: STG Decreased Risk of Fall With Supervision Assistance. Outcome: Progressing   Problem: RH PAIN MANAGEMENT Goal: RH STG PAIN MANAGED AT OR BELOW PT'S PAIN GOAL Description: < 3 on a 0-10 pain scale. Outcome: Progressing   Problem: RH KNOWLEDGE DEFICIT GENERAL Goal: RH STG INCREASE KNOWLEDGE OF SELF CARE AFTER HOSPITALIZATION Description: Patient will demonstrate knowledge of self-care, medication/pain management, bowel/bladder management, and weight bearing precautions with educational materials and handouts provided by staff independently at discharge. Outcome: Progressing

## 2021-12-08 NOTE — Progress Notes (Signed)
PROGRESS NOTE   Subjective/Complaints:   Pt reports was asymptomatic when BP was "low" last night. BP running 90s/47- 60s Had 2 large BM's overnight- feels much better.   Cr and BUN same-    ROS-   Pt denies SOB, abd pain, CP, N/V/C/D, and vision changes    Objective:   No results found. No results for input(s): WBC, HGB, HCT, PLT in the last 72 hours.  Recent Labs    12/06/21 0521 12/08/21 0643  NA 130* 133*  K 4.7 5.1  CL 98 102  CO2 22 22  GLUCOSE 118* 121*  BUN 29* 29*  CREATININE 1.36* 1.37*  CALCIUM 9.0 9.3    Intake/Output Summary (Last 24 hours) at 12/08/2021 0804 Last data filed at 12/07/2021 1900 Gross per 24 hour  Intake 360 ml  Output --  Net 360 ml       Physical Exam: Vital Signs Blood pressure 98/64, pulse 95, temperature 98.5 F (36.9 C), resp. rate 14, height 5\' 6"  (1.676 m), weight 126 kg, SpO2 98 %.     General: awake, alert, appropriate, laying supine in bed- just had BM with nursing; NAD HENT: conjugate gaze; oropharynx moist- poor dentition CV: regular rate and rhythm- in 90s; no JVD Pulmonary: CTA B/L; no W/R/R- good air movement GI: soft, NT, ND, (+)BS- much softer Psychiatric: appropriate Neurological: alert- repeats herself 2-3x with short phrases  Skin- mild tenting noted on exam- same today Extremities: No clubbing, cyanosis, or edema Skin: No evidence of breakdown, no evidence of rash- has abrasion on L buttock- not pressure Neurologic: Cranial nerves II through XII intact, motor strength is /5 in bilateral deltoid, bicep, tricep, grip, 2- bilateral hip flexor, knee extensors, ankle dorsiflexor and plantar flexor Sensory exam parasthesia to LT in BLE Cerebellar exam normal finger to nose to finger as well as heel to shin in bilateral upper and lower extremities Musculoskeletal: Pain with Bilateral knee ROM, no knee effusion or tenderness     Assessment/Plan: 1.  Functional deficits which require 3+ hours per day of interdisciplinary therapy in a comprehensive inpatient rehab setting. Physiatrist is providing close team supervision and 24 hour management of active medical problems listed below. Physiatrist and rehab team continue to assess barriers to discharge/monitor patient progress toward functional and medical goals  Care Tool:  Bathing    Body parts bathed by patient: Right arm, Left arm, Chest, Abdomen, Front perineal area, Right upper leg, Left upper leg, Face   Body parts bathed by helper: Buttocks, Right lower leg, Left lower leg     Bathing assist Assist Level: Maximal Assistance - Patient 24 - 49%     Upper Body Dressing/Undressing Upper body dressing   What is the patient wearing?: Pull over shirt    Upper body assist Assist Level: Moderate Assistance - Patient 50 - 74%    Lower Body Dressing/Undressing Lower body dressing      What is the patient wearing?: Underwear/pull up     Lower body assist Assist for lower body dressing: 2 Helpers     Toileting Toileting Toileting Activity did not occur (Clothing management and hygiene only): N/A (no void or bm)  Toileting  assist Assist for toileting: Total Assistance - Patient < 25%     Transfers Chair/bed transfer  Transfers assist     Chair/bed transfer assist level: Dependent - mechanical lift     Locomotion Ambulation   Ambulation assist   Ambulation activity did not occur: Safety/medical concerns          Walk 10 feet activity   Assist  Walk 10 feet activity did not occur: Safety/medical concerns        Walk 50 feet activity   Assist Walk 50 feet with 2 turns activity did not occur: Safety/medical concerns         Walk 150 feet activity   Assist Walk 150 feet activity did not occur: Safety/medical concerns         Walk 10 feet on uneven surface  activity   Assist Walk 10 feet on uneven surfaces activity did not occur:  Safety/medical concerns         Wheelchair     Assist Is the patient using a wheelchair?: Yes Type of Wheelchair: Manual    Wheelchair assist level: Minimal Assistance - Patient > 75% Max wheelchair distance: 48'    Wheelchair 50 feet with 2 turns activity    Assist        Assist Level: Minimal Assistance - Patient > 75%   Wheelchair 150 feet activity     Assist      Assist Level: Moderate Assistance - Patient 50 - 74%   Blood pressure 98/64, pulse 95, temperature 98.5 F (36.9 C), resp. rate 14, height 5\' 6"  (1.676 m), weight 126 kg, SpO2 98 %.  Medical Problem List and Plan: 1. 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 - Decadron taper             -patient may shower             -ELOS/Goals: 10-12 days, supervision goals with PT and OT  D/c 3/14  Con't CIR_ PT and OT- will determine if low BP affecting therapy- if so, will add midodrine- if not, will wait.  2.  Antithrombotics: -DVT/anticoagulation:  Pharmaceutical: Heparin.  Check vascular study             -antiplatelet therapy: Aspirin 325 mg daily 3. Pain Management: Oxycodone as needed Knee pain 2019 xray show severe OA on RIght and Moderate on Left  OA will add voltaren gel   Changed Oxy to q4 hours prn  2/24- pain doing somewhat better 4. Mood: Trazodone 150 mg nightly, Xanax 0.25 mg twice daily as needed  2/24- bright affect- cont regimen             -antipsychotic agents: N/A 5. Neuropsych: This patient is capable of making decisions on his own behalf. 6. Skin/Wound Care: Routine skin checks             -encourage appropriate nutrition              -pressure relief 7. Fluids/Electrolytes/Nutrition:  .               -add protein supp for low albumin 8.  Diabetes mellitus.  Hemoglobin A1c 6.0.                CBG (last 3)       2/24- CBGs 90s-140s- con't regimen 9.  Hypertension.  Lopressor 25 mg twice daily, hydralazine 25 mg every 8 hours.      Off all BP meds  2/24- BP  still low-  asymptomatic per pt- however if therapy feels she's having Sx's- can add Midodrine 5mg  BID with breakfast/lunch Vitals:   12/08/21 0330 12/08/21 0514  BP: (!) 113/47 98/64  Pulse: 84 95  Resp: 14   Temp: 98.5 F (36.9 C)   SpO2: 99% 98%    10.  Hyperlipidemia.  Zocor 11.  Microcytic anemia.  Continue iron supplement.  Follow-up CBC 12.  Acute urinary retention/neurogenic bladder.  Foley tube removed.   -Presently on Flomax 0.4 mg.   -need pvr's  2/21- last cathed 2/19 early AM- but bladder scans <150 cc since then- off PVRs right now 13.  Constipation/neurogenic bowel.   -Senokot twice daily, MiraLAX daily Changed Senna to 2 tabs BID 2/24- 2 large BM's- last night- con't regimen and titrate as required 14.  Obesity.  BMI 39.5.  Dietary follow-up 15.  History of CVA.  Modified independent with straight point cane prior to admission.  Continue aspirin 16.  Leukocytosis.  Suspect steroid-induced--improved to 9.6 2/17  2/20- WBC down to 7.8k- con't regimen 17. AKI  2/20- Cr up to 1.56 and BUN up to 31- will recheck Wednesday and push fluids- hopefully will help BP and Cr.   2/24- BUN still 29 and Cr 1.37- will give IVFs since pushing lfuids isn't working- and recheck in AM- IVFs NS 75 cc/hour x 24 hours 18. Abrasion L buttock  2/22- doesn' thave pressure ulcer- per my exam nor WOC- will con't wound care.    I spent a total of  37  minutes on total care today- >50% coordination of care- due to d/w nursing and assessment of renal issues and low BP- also d/w NT.        LOS: 8 days A FACE TO FACE EVALUATION WAS PERFORMED  Wilbur Labuda 12/08/2021, 8:04 AM

## 2021-12-08 NOTE — Progress Notes (Signed)
Pt Mews green 1 for BP. Bps continue to trend 90's SBP and high 40's DBP. Pt denies any dizziness/ lightheadedness. Patient education on pain meds (OXY), and increasing PO water intake. Pt given PRN 650 mg Tylenol for buttock pain instead of Oxy at 0200. Provided water at bedside and will continue to Monitor.

## 2021-12-08 NOTE — Progress Notes (Signed)
Occupational Therapy Session Note  Patient Details  Name: DESMOND TUFANO MRN: [id number] Date of Birth: [birth year]  Today's Date: 12/08/2021 OT Group Time: 1100-1200 OT Group Time Calculation (min): 60 min   Short Term Goals: Week 1:  OT Short Term Goal 1 (Week 1): Pt will don UB clothing with CGA OT Short Term Goal 1 - Progress (Week 1): Progressing toward goal OT Short Term Goal 2 (Week 1): Pt will complete sit > stand with max A of 1 using LRAD OT Short Term Goal 2 - Progress (Week 1): Progressing toward goal OT Short Term Goal 3 (Week 1): Pt will complete bed mobility to EOB with CGA OT Short Term Goal 3 - Progress (Week 1): Progressing toward goal  Skilled Therapeutic Interventions/Progress Updates:    Pt participated in rhythmic drumming group. Pain not reported throughout session Focus of group on BUE coordination, strengthening, endurance, timing/control, activity tolerance, and social participation and engagement. Pt performs session from seated position for energy conservation. Skilled interventions included accessing rhythm for control/coordination, unsupported sittng for banace/care activation. Warm up performed prior to exercises and UB stretching completed at end of group with demo from OT. Pt able to select preferred song to share with group. Returned pt to room at end of session. Exited session with pt seated in w/c, exit alarm on and call light in reach   Therapy Documentation Precautions:  Precautions Precautions: Fall Restrictions Weight Bearing Restrictions: No General:    Therapy/Group: Group Therapy  Tonny Branch 12/08/2021, 6:53 AM

## 2021-12-08 NOTE — Progress Notes (Signed)
Physical Therapy Session Note  Patient Details  Name: Brandi [name] MRN: [id number] Date of Birth: 31-Mar-1956  Today's Date: 12/08/2021 PT Individual Time: 0805-0900 PT Individual Time Calculation (min): 55 min   Short Term Goals: Week 1:  PT Short Term Goal 1 (Week 1): Pt will transfer to [facility] with Slide board and max assist PT Short Term Goal 2 (Week 1): Pt will performed sit<>stand in parallel bars with max assist +2 PT Short Term Goal 3 (Week 1): Pt will propel WC 138ft with supervision assist PT Short Term Goal 4 (Week 1): pt will tolerate sitting up in WC >2 hours between therapies in WC PT Short Term Goal 5 - Progress (Week 1): Revised due to lack of progress  Skilled Therapeutic Interventions/Progress Updates: Pt presented in bed agreeable to therapy. Pt c/o pain in buttocks, nsg aware, unable to receive am meds until after session. PTA threaded pants total A and pt performed rolling L/R with minA to pull pants over hips. Pt then performed supine to sit with modA and max cues for sequencing but assistance primarily required for BLE management. PTA donned socks, soft ankle brace, and shoes total A while pt maintained close S sitting EOB. Pt stating increased dizziness while sitting EOB, BP checked 113/71. Pt then donned bra and shirt with minA. Performed Slide board transfer to w/c with modA with PTA facilitating via [place] technique to improve head/hips relationship. In w/c pt required max multimodal cues for improved postioning. Pt transported to ortho gym to trial standing. Pt indicated continued to feel dizzy, BP checked again 94/68 (78), although pt not orthostatic PTA deferred standing activities at this time. Pt moved to hallway and was able to propel w/c back to room including turning into room with close supervision! Pt left in w/c at end of session with belt alarm on, call bell within reach and needs met.      Therapy Documentation Precautions:  Precautions Precautions:  Fall Restrictions Weight Bearing Restrictions: No General:   Vital Signs: Therapy Vitals Temp: 98 F (36.7 C) Temp Source: Oral Pulse Rate: 98 Resp: 20 BP: 132/66 Patient Position (if appropriate): Sitting Oxygen Therapy SpO2: 99 % O2 Device: Room Air Pain:   Mobility:   Locomotion :    Trunk/Postural Assessment :    Balance:   Exercises:   Other Treatments:      Therapy/Group: Individual Therapy  Shila Kruczek 12/08/2021, 4:41 PM

## 2021-12-08 NOTE — Progress Notes (Signed)
Physical Therapy Weekly Progress Note  Patient Details  Name: Brandi [name] MRN: [id number] Date of Birth: 23-Dec-1955  Beginning of progress report period: December 01, 2021 End of progress report period: December 08, 2021  Today's Date: 12/11/2021 PT Individual Time: 0900-1010 PT Individual Time Calculation (min): 70 min   Patient has met 3 of 4 short term goals.  Pt is slowly progressing towards goals this session. Pt is currently minA for rolling, modA for supine to sit, and maxA x 1 for Slide board transfers. Pt has progressed with w/c mobility however continues to require maxA x 2 for attempting standing in [place] however is inconsistent in clearing plates. Pt has tolerated standing frame however.   Patient continues to demonstrate the following deficits muscle weakness and muscle paralysis, impaired timing and sequencing, unbalanced muscle activation, decreased coordination, and decreased motor planning, and decreased initiation, decreased attention, and decreased safety awareness and therefore will continue to benefit from skilled PT intervention to increase functional independence with mobility.  Patient progressing toward long term goals..  Continue plan of care.  PT Short Term Goals Week 1:  PT Short Term Goal 1 (Week 1): Pt will transfer to [facility] with Slide board and max assist PT Short Term Goal 1 - Progress (Week 1): Met PT Short Term Goal 2 (Week 1): Pt will performed sit<>stand in parallel bars with max assist +2 PT Short Term Goal 2 - Progress (Week 1): Progressing toward goal PT Short Term Goal 3 (Week 1): Pt will propel WC 122ft with supervision assist PT Short Term Goal 3 - Progress (Week 1): Met PT Short Term Goal 4 (Week 1): pt will tolerate sitting up in WC >2 hours between therapies in WC PT Short Term Goal 4 - Progress (Week 1): Met PT Short Term Goal 5 - Progress (Week 1): Revised due to lack of progress Week 2:  PT Short Term Goal 1 (Week 2): Pt will perform sit  to stand with max A and LRAD PT Short Term Goal 2 (Week 2): Pt will perform least restrictive transfer with assist x 1 PT Short Term Goal 3 (Week 2): Pt will perform bed mobility with mod A consistently  Skilled Therapeutic Interventions/Progress Updates:  Discharge planning;Functional mobility training;Psychosocial support;Therapeutic Activities;Visual/perceptual remediation/compensation;Balance/vestibular training;Disease management/prevention;Neuromuscular re-education;Skin care/wound management;Therapeutic Exercise;Wheelchair propulsion/positioning;Cognitive remediation/compensation;DME/adaptive equipment instruction;Pain management;Splinting/orthotics;Community reintegration;Functional electrical stimulation;Patient/family education;UE/LE Coordination activities;UE/LE Strength taining/ROM;Ambulation/gait training   Therapy Documentation Precautions:  Precautions Precautions: Fall Restrictions Weight Bearing Restrictions: No    Therapy/Group: Individual Therapy   Excell Seltzer, PT, DPT, CSRS 12/11/2021, 12:26 PM

## 2021-12-09 DIAGNOSIS — E1165 Type 2 diabetes mellitus with hyperglycemia: Secondary | ICD-10-CM

## 2021-12-09 DIAGNOSIS — E785 Hyperlipidemia, unspecified: Secondary | ICD-10-CM

## 2021-12-09 DIAGNOSIS — Z8679 Personal history of other diseases of the circulatory system: Secondary | ICD-10-CM | POA: Diagnosis not present

## 2021-12-09 DIAGNOSIS — G959 Disease of spinal cord, unspecified: Secondary | ICD-10-CM | POA: Diagnosis not present

## 2021-12-09 DIAGNOSIS — N179 Acute kidney failure, unspecified: Secondary | ICD-10-CM

## 2021-12-09 LAB — BASIC METABOLIC PANEL
Anion gap: 9 (ref 5–15)
BUN: 26 mg/dL — ABNORMAL HIGH (ref 8–23)
CO2: 20 mmol/L — ABNORMAL LOW (ref 22–32)
Calcium: 9.1 mg/dL (ref 8.9–10.3)
Chloride: 104 mmol/L (ref 98–111)
Creatinine, Ser: 1.29 mg/dL — ABNORMAL HIGH (ref 0.44–1.00)
GFR, Estimated: 46 mL/min — ABNORMAL LOW (ref >60)
Glucose, Bld: 110 mg/dL — ABNORMAL HIGH (ref 70–99)
Potassium: 4.8 mmol/L (ref 3.5–5.1)
Sodium: 133 mmol/L — ABNORMAL LOW (ref 135–145)

## 2021-12-09 LAB — GLUCOSE, CAPILLARY
Glucose-Capillary: 104 mg/dL — ABNORMAL HIGH (ref 70–99)
Glucose-Capillary: 106 mg/dL — ABNORMAL HIGH (ref 70–99)
Glucose-Capillary: 99 mg/dL (ref 70–99)

## 2021-12-09 NOTE — Progress Notes (Addendum)
PROGRESS NOTE   Subjective/Complaints: Patient seen lying in bed this morning.  She states she slept well overnight.  She notes she is drinking more water.  She was seen by neurosurgery yesterday, notes reviewed-stable, continue therapy for now-potential for T4/T5 laminectomy in future.  ROS-   Pt denies SOB, abd pain, CP, N/V/C/D, and vision changes    Objective:   No results found. No results for input(s): WBC, HGB, HCT, PLT in the last 72 hours.  Recent Labs    12/08/21 0643 12/09/21 0642  NA 133* 133*  K 5.1 4.8  CL 102 104  CO2 22 20*  GLUCOSE 121* 110*  BUN 29* 26*  CREATININE 1.37* 1.29*  CALCIUM 9.3 9.1     Intake/Output Summary (Last 24 hours) at 12/09/2021 0930 Last data filed at 12/08/2021 2145 Gross per 24 hour  Intake 1317.4 ml  Output --  Net 1317.4 ml        Physical Exam: Vital Signs Blood pressure 100/66, pulse 76, temperature 98.4 F (36.9 C), temperature source Oral, resp. rate 16, height 5\' 6"  (1.676 m), weight 126 kg, SpO2 95 %.     General: awake, alert, appropriate, NAD.  HENT: conjugate gaze; oropharynx moist- poor dentition CV: regular rate and rhythm- in 90s; no JVD Pulmonary: CTA B/L; no W/R/R- good air movement GI: soft, NT, ND, (+)BS- much softer Psychiatric: appropriate Extremities: No clubbing, cyanosis, or edema Neurologic: Alert motor strength is /5 in bilateral deltoid, bicep, tricep, grip, 3- bilateral hip flexor, knee extensors, ankle dorsiflexor and plantar flexor    Assessment/Plan: 1. Functional deficits which require 3+ hours per day of interdisciplinary therapy in a comprehensive inpatient rehab setting. Physiatrist is providing close team supervision and 24 hour management of active medical problems listed below. Physiatrist and rehab team continue to assess barriers to discharge/monitor patient progress toward functional and medical goals  Care  Tool:  Bathing    Body parts bathed by patient: Right arm, Left arm, Chest, Abdomen, Front perineal area, Right upper leg, Left upper leg, Face   Body parts bathed by helper: Buttocks, Right lower leg, Left lower leg     Bathing assist Assist Level: Maximal Assistance - Patient 24 - 49%     Upper Body Dressing/Undressing Upper body dressing   What is the patient wearing?: Pull over shirt    Upper body assist Assist Level: Moderate Assistance - Patient 50 - 74%    Lower Body Dressing/Undressing Lower body dressing      What is the patient wearing?: Underwear/pull up     Lower body assist Assist for lower body dressing: 2 Helpers     Toileting Toileting Toileting Activity did not occur (Clothing management and hygiene only): N/A (no void or bm)  Toileting assist Assist for toileting: Total Assistance - Patient < 25%     Transfers Chair/bed transfer  Transfers assist     Chair/bed transfer assist level: Dependent - mechanical lift     Locomotion Ambulation   Ambulation assist   Ambulation activity did not occur: Safety/medical concerns          Walk 10 feet activity   Assist  Walk 10 feet activity  did not occur: Safety/medical concerns        Walk 50 feet activity   Assist Walk 50 feet with 2 turns activity did not occur: Safety/medical concerns         Walk 150 feet activity   Assist Walk 150 feet activity did not occur: Safety/medical concerns         Walk 10 feet on uneven surface  activity   Assist Walk 10 feet on uneven surfaces activity did not occur: Safety/medical concerns         Wheelchair     Assist Is the patient using a wheelchair?: Yes Type of Wheelchair: Manual    Wheelchair assist level: Minimal Assistance - Patient > 75% Max wheelchair distance: 40'    Wheelchair 50 feet with 2 turns activity    Assist        Assist Level: Minimal Assistance - Patient > 75%   Wheelchair 150 feet activity      Assist      Assist Level: Moderate Assistance - Patient 50 - 74%   Blood pressure 100/66, pulse 76, temperature 98.4 F (36.9 C), temperature source Oral, resp. rate 16, height 5\' 6"  (1.676 m), weight 126 kg, SpO2 95 %.  Medical Problem List and Plan: 1. 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 - Decadron taper             -patient may shower             -ELOS/Goals: 10-12 days, supervision goals with PT and OT  D/c 3/14  Continue CIR 2.  Antithrombotics: -DVT/anticoagulation:  Pharmaceutical: Heparin.               -antiplatelet therapy: Aspirin 325 mg daily 3. Pain Management: Oxycodone as needed Knee pain 2019 xray show severe OA on RIght and Moderate on Left  OA will add voltaren gel   Changed Oxy to q4 hours prn  2/24- pain doing somewhat better 4. Mood: Trazodone 150 mg nightly, Xanax 0.25 mg twice daily as needed  2/24- bright affect- cont regimen             -antipsychotic agents: N/A 5. Neuropsych: This patient is capable of making decisions on his own behalf. 6. Skin/Wound Care: Routine skin checks             -encourage appropriate nutrition              -pressure relief 7. Fluids/Electrolytes/Nutrition:  .               -add protein supp for low albumin 8.  Diabetes mellitus.  Hemoglobin A1c 6.0.                CBG (last 3)       Controlled on 2/25 9.  Hypertension.  Lopressor 25 mg twice daily, hydralazine 25 mg every 8 hours.      Off all BP meds  Soft, but asymptomatic on 2/25 Vitals:   12/09/21 0453 12/09/21 0800  BP: 109/66 100/66  Pulse: 77 76  Resp: 18 16  Temp: 97.9 F (36.6 C) 98.4 F (36.9 C)  SpO2: 100% 95%    10.  Hyperlipidemia.  Zocor 11.  Microcytic anemia.  Continue iron supplement.  Follow-up CBC 12.  Acute urinary retention/neurogenic bladder.  Foley tube removed.   -Presently on Flomax 0.4 mg.   -need pvr's  2/21- last cathed 2/19 early AM- but bladder scans <150 cc since then- off PVRs right now 13.  Constipation   -Senokot twice daily, MiraLAX daily Changed Senna to 2 tabs BID She notes improvement in bowel movements and states she did not take the laxatives yesterday 14.  Obesity.  BMI 39.5.  Dietary follow-up 15.  History of CVA.  Modified independent with straight point cane prior to admission.  Continue aspirin 16.  Leukocytosis.  Suspect steroid-induced--improved to 9.6 2/17  2/20- WBC down to 7.8k- con't regimen 17. AKI  2/20- Cr up to 1.56 and BUN up to 31- will recheck Wednesday and push fluids- hopefully will help BP and Cr.   2/24- BUN still 29 and Cr 1.37- will give IVFs since pushing lfuids isn't working- and recheck in AM- IVFs NS 75 cc/hour x 24 hours  Creatinine 1.29 on 2/25, improving 18. Abrasion L buttock  2/22- doesn' thave pressure ulcer- per my exam nor WOC- will con't wound care.  19.  Hyponatremia  Sodium 133 on 2/25-stable   LOS: 9 days A FACE TO FACE EVALUATION WAS PERFORMED  Devondre Guzzetta Lorie Phenix 12/09/2021, 9:30 AM

## 2021-12-10 LAB — GLUCOSE, CAPILLARY
Glucose-Capillary: 116 mg/dL — ABNORMAL HIGH (ref 70–99)
Glucose-Capillary: 92 mg/dL (ref 70–99)
Glucose-Capillary: 104 mg/dL — ABNORMAL HIGH (ref 70–99)

## 2021-12-10 NOTE — Progress Notes (Signed)
Physical Therapy Session Note  Patient Details  Name: NAIRI OSWALD MRN: [id number] Date of Birth: [birth year]  Today's Date: 12/10/2021 PT Individual Time: 0805-0900 PT Individual Time Calculation (min): 55 min   Short Term Goals: Week 1:  PT Short Term Goal 1 (Week 1): Pt will transfer to [facility] with Slide board and max assist PT Short Term Goal 1 - Progress (Week 1): Met PT Short Term Goal 2 (Week 1): Pt will performed sit<>stand in parallel bars with max assist +2 PT Short Term Goal 2 - Progress (Week 1): Progressing toward goal PT Short Term Goal 3 (Week 1): Pt will propel WC 153f with supervision assist PT Short Term Goal 3 - Progress (Week 1): Met PT Short Term Goal 4 (Week 1): pt will tolerate sitting up in WC >2 hours between therapies in WC PT Short Term Goal 4 - Progress (Week 1): Met PT Short Term Goal 5 - Progress (Week 1): Revised due to lack of progress  Skilled Therapeutic Interventions/Progress Updates: Pt presented in bed agreeable to therapy. Pt states pain 7/10 with pt receiving meds from nsg during session. PTA threaded pants total A and pt performed rolling L/R with minA to allow PTA to pull pants over hips. Pt then performed supine to sit with modA and use of bed features. Performed Slide board transfer to w/c with maxA. Pt requesting to was up at sink. Pt was able to wash upper body at w/c level with set up and increased time. Once completed pt transported to day room and participated in Cybex Kinetron 70cm/sec 15 cycles x 3 bouts. Pt with improved use to RLE this session as compared to when performed with this therapist previously. Pt then propelled w/c [id number] supervision. Pt transported remaining distance and remained in w/c with belt alarm on, call bell within reach and needs met.      Therapy Documentation Precautions:  Precautions Precautions: Fall Restrictions Weight Bearing Restrictions: No General:   Vital Signs: Therapy Vitals Temp: 97.6 F (36.4  C) Pulse Rate: 86 Resp: 16 BP: (!) 130/58 Patient Position (if appropriate): Lying Oxygen Therapy SpO2: 100 % Pain: Pain Assessment Pain Score: Asleep Mobility:   Locomotion :    Trunk/Postural Assessment :    Balance:   Exercises:   Other Treatments:      Therapy/Group: Individual Therapy  Janye Maynor 12/10/2021, 4:28 PM

## 2021-12-10 NOTE — Plan of Care (Signed)
°  Problem: Consults Goal: RH GENERAL PATIENT EDUCATION Description: See Patient Education module for education specifics. Outcome: Progressing   Problem: RH BOWEL ELIMINATION Goal: RH STG MANAGE BOWEL WITH ASSISTANCE Description: STG Manage Bowel with Supervision Assistance. Outcome: Progressing Goal: RH STG MANAGE BOWEL W/MEDICATION W/ASSISTANCE Description: STG Manage Bowel with Medication with Supervision Assistance. Outcome: Progressing   Problem: RH BLADDER ELIMINATION Goal: RH STG MANAGE BLADDER WITH ASSISTANCE Description: STG Manage Bladder With Supervision Assistance Outcome: Progressing Goal: RH STG MANAGE BLADDER WITH MEDICATION WITH ASSISTANCE Description: STG Manage Bladder With Medication With Supervision Assistance. Outcome: Progressing   Problem: RH SKIN INTEGRITY Goal: RH STG MAINTAIN SKIN INTEGRITY WITH ASSISTANCE Description: STG Maintain Skin Integrity With Supervision Assistance. Outcome: Progressing   Problem: RH SAFETY Goal: RH STG ADHERE TO SAFETY PRECAUTIONS W/ASSISTANCE/DEVICE Description: STG Adhere to Safety Precautions With Cues and Reminders. Outcome: Progressing Goal: RH STG DECREASED RISK OF FALL WITH ASSISTANCE Description: STG Decreased Risk of Fall With Supervision Assistance. Outcome: Progressing   Problem: RH PAIN MANAGEMENT Goal: RH STG PAIN MANAGED AT OR BELOW PT'S PAIN GOAL Description: < 3 on a 0-10 pain scale. Outcome: Progressing   Problem: RH KNOWLEDGE DEFICIT GENERAL Goal: RH STG INCREASE KNOWLEDGE OF SELF CARE AFTER HOSPITALIZATION Description: Patient will demonstrate knowledge of self-care, medication/pain management, bowel/bladder management, and weight bearing precautions with educational materials and handouts provided by staff independently at discharge. Outcome: Progressing

## 2021-12-11 DIAGNOSIS — G959 Disease of spinal cord, unspecified: Secondary | ICD-10-CM | POA: Diagnosis not present

## 2021-12-11 DIAGNOSIS — Z8673 Personal history of transient ischemic attack (TIA), and cerebral infarction without residual deficits: Secondary | ICD-10-CM | POA: Diagnosis not present

## 2021-12-11 LAB — CBC WITH DIFFERENTIAL/PLATELET
Abs Immature Granulocytes: 0 10*3/uL (ref 0.00–0.07)
Basophils Absolute: 0 10*3/uL (ref 0.0–0.1)
Basophils Relative: 1 %
Eosinophils Absolute: 0.1 10*3/uL (ref 0.0–0.5)
Eosinophils Relative: 4 %
HCT: 39.3 % (ref 36.0–46.0)
Hemoglobin: 12.1 g/dL (ref 12.0–15.0)
Lymphocytes Relative: 49 %
Lymphs Abs: 1.6 10*3/uL (ref 0.7–4.0)
MCH: 22.6 pg — ABNORMAL LOW (ref 26.0–34.0)
MCHC: 30.8 g/dL (ref 30.0–36.0)
MCV: 73.5 fL — ABNORMAL LOW (ref 80.0–100.0)
Monocytes Absolute: 0.3 10*3/uL (ref 0.1–1.0)
Monocytes Relative: 8 %
Neutro Abs: 1.3 10*3/uL — ABNORMAL LOW (ref 1.7–7.7)
Neutrophils Relative %: 38 %
Platelets: 263 10*3/uL (ref 150–400)
RBC: 5.35 MIL/uL — ABNORMAL HIGH (ref 3.87–5.11)
WBC: 3.3 10*3/uL — ABNORMAL LOW (ref 4.0–10.5)
nRBC: 0 % (ref 0.0–0.2)
nRBC: 0 /100 WBC

## 2021-12-11 LAB — COMPREHENSIVE METABOLIC PANEL
ALT: 21 U/L (ref 0–44)
AST: 15 U/L (ref 15–41)
Albumin: 3.3 g/dL — ABNORMAL LOW (ref 3.5–5.0)
Alkaline Phosphatase: 53 U/L (ref 38–126)
Anion gap: 9 (ref 5–15)
BUN: 19 mg/dL (ref 8–23)
CO2: 24 mmol/L (ref 22–32)
Calcium: 9.5 mg/dL (ref 8.9–10.3)
Chloride: 102 mmol/L (ref 98–111)
Creatinine, Ser: 1.25 mg/dL — ABNORMAL HIGH (ref 0.44–1.00)
GFR, Estimated: 48 mL/min — ABNORMAL LOW (ref >60)
Glucose, Bld: 102 mg/dL — ABNORMAL HIGH (ref 70–99)
Potassium: 4.2 mmol/L (ref 3.5–5.1)
Sodium: 135 mmol/L (ref 135–145)
Total Bilirubin: 0.3 mg/dL (ref 0.3–1.2)
Total Protein: 6.5 g/dL (ref 6.5–8.1)

## 2021-12-11 LAB — GLUCOSE, CAPILLARY
Glucose-Capillary: 93 mg/dL (ref 70–99)
Glucose-Capillary: 113 mg/dL — ABNORMAL HIGH (ref 70–99)
Glucose-Capillary: 103 mg/dL — ABNORMAL HIGH (ref 70–99)
Glucose-Capillary: 114 mg/dL — ABNORMAL HIGH (ref 70–99)

## 2021-12-11 MED ORDER — SENNA 8.6 MG PO TABS
3.0000 | ORAL_TABLET | Freq: Every day | ORAL | Status: DC
  Filled 2021-12-11: qty 3

## 2021-12-11 NOTE — Progress Notes (Signed)
Physical Therapy Session Note  Patient Details  Name: Brandi [name] MRN: [id number] Date of Birth: 08-15-1956  Today's Date: 12/11/2021 PT Individual Time: 0900-1010 PT Minutes: 70 minutes    Short Term Goals: Week 2:  PT Short Term Goal 1 (Week 2): Pt will perform sit to stand with max A and LRAD PT Short Term Goal 2 (Week 2): Pt will perform least restrictive transfer with assist x 1 PT Short Term Goal 3 (Week 2): Pt will perform bed mobility with mod A consistently  Skilled Therapeutic Interventions/Progress Updates:    Pt received seated in bed, agreeable to PT session. No complaints of pain this AM, reports she has been premedicated prior to start of therapy session. Pt is dependent to don pant at bed level. Rolling L/R with min A with use of bedrails, increased time for motor planning required and dependent to pull pants up over hips. Supine to sit with max A for BLE management and trunk elevation. Assisted pt with donning R ASO, socks, and shoes while seated EOB. Slide board transfer bed to w/c with mod A x 1 and CGA x 1 for safety. Pt continues to require increased time and cues to scoot herself safely all the way back in the w/c. Attempt standing in // bars with total A x 2 and knees blocked. Pt with significant anterior lean in standing and unable to hold herself up without total A x 2 to perform any functional standing tasks, returned to sitting in w/c. Slide board transfer w/c to/from mat table with mod A x 1 and CGA to min A x 1 for board stabilization and pt safety. Seated balance EOM performing L/R lateral leans with min to CGA required to return to midline (increased difficulty with RUE as compared to LUE), reciprocal scooting forwards/backwards with min cueing, seated ball toss with progress to ball toss against rebounder with CGA for sitting balance. Seated BLE strengthening on Kinetron level 60 cm/sec performing hip and knee extesion 2 x 1 min each. Cues for decreased anterior  trunk lean and R rotation during exercise. Pt agreeable to remain seated in w/c in room at end of session, needs in reach.  Therapy Documentation Precautions:  Precautions Precautions: Fall Restrictions Weight Bearing Restrictions: No       Therapy/Group: Individual Therapy   Excell Seltzer, PT, DPT, CSRS  12/11/2021, 4:57 PM

## 2021-12-11 NOTE — Consult Note (Signed)
Neuropsychological Consultation   Patient:   Brandi [name]   DOB:   11-24-1955  MR Number:  [id number]  Location:  [place] [address] C[facility] [place] [id number] [place] [address] Dept: [place]: [phone]           Date of Service:   12/11/2021  Start Time:   8 AM End Time:   9 AM  Provider/Observer:  Ilean Skill, Psy.D.       Clinical Neuropsychologist       Billing Code/Service: [phone]  Chief Complaint:    Brandi [name] is a 66 year old right-handed female with a past medical history that includes parasitic anemia, diabetes, hypertension, hyperlipidemia.  Patient has a history of cerebrovascular accident in 2013 and has been maintained on aspirin since that time.  Patient presented on 11/20/2021 with lower extremity weakness and progressive paraplegia.  Patient had presented to [facility] with gait instability and gait changes as well as tingling in the left foot for the prior 2 weeks.  CT/MRI of the brain did identify chronic left temporoparietal encephalomalacia with no evidence of acute abnormality.  MRA of head was was negative.  MRI cervical thoracic lumbar spine showed widespread spondylosis and stenosis.  T3-4, T4-5 disc bulge with osteophyte ridging and with moderate to severe canal stenosis.  Patient placed on steroid taper with suspected myeloneuropathy with upper thoracic sensory level.  Felt transverse myelitis relatively unlikely.  Patient assessed by therapies and due to decreased functional mobility was admitted to CIR.  Patient has continued to have expressive language deficits noted since 2013.  Patient's MRI in 2013 following her stroke indicated left middle cerebral artery distribution acute nonhemorrhagic infarct.  Involvement included left frontal lobe, left parietal lobe, left ocular and subinsular region and left temporal lobe involvement.  There was also indications of  remote infarct of the left frontal parietal junction noted in 2013.  More brain areas were involved then more captured in the most recent MRI as the patient is claustrophobic and there are some challenges of getting an extended MRI.   Reason for Service:  Patient was referred for neuropsychological consultation due to ongoing coping issues with extended hospital stay but also concerns about cognitive functioning in light of prior history of CVA impacting wide ranges of her left hemisphere with observable impacts on expressive language capacity.  Below is the HPI for the current admission.  HPI: Brandi [name] is a 66 year old right-handed female with history of parasitic anemia, diabetes mellitus hypertension, history of CVA maintained on aspirin, hyperlipidemia, quit smoking 10 years ago.  Per chart review patient lives with children.  1 level home 10 steps to entry.  Independent with straight point cane.  Presented 11/20/2021 with lower extremity weakness progressive paraplegia to [facility] as well as tingling in left foot x2 weeks as well as gait instability.Marland Kitchen  CT/MRI of the brain chronic left temporoparietal encephalomalacia no evidence of acute abnormality.  MRA of the head negative. 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  MRI thoracic spine showed T3-4, T4-5 disc bulge with osteophytic ridging with moderate to severe canal stenosis.  Neurosurgical /neurology follow-up no surgical intervention placed on steroid taper.  Scans reviewed by neurosurgery for etiology of her weakness and did not believe it was from her cervical disease she had no upper extremity involvement.  Suspect myelopathy with upper thoracic sensory level  It was felt that transverse myelitis relatively unlikely.  Awaiting plan for possible myelogram.  Patient was cleared to begin subcutaneous heparin for DVT prophylaxis.  Bouts of acute urinary retention Foley  tube removed currently maintained on Flomax.  Chronic anemia hemoglobin 9.5 and monitored.  Therapy evaluations completed due to patient decreased functional mobility was admitted for a comprehensive rehab program.  Current Status:  The patient was awake and alert as I entered the room with bright affect.  She appeared to be well oriented with good mental status but clear nonfluent aphasia.  However, her presentation was not typical of focal involvement of left frontal region/Broca's area type of aphasia.  Has reported that her expressive language functioning improves with benzodiazepine and and does have a order for as needed meds.  Patient denied any significant anxiety and denied any depressive symptomatology.  Patient reported that there are times when she has difficulty processing clearly what others are saying and also has difficulty getting her thoughts and what she wants to express organize clearly.  While the patient reports that she has difficulties at times with word finding when she does say the word it tends to be pronounced and articulated correctly.  Assessing overall cognitive functioning the patient does appear to be aware and competent and this does appear to be primarily a language based function although there are some issues of comprehension and executive functioning involved in her deficits.  The patient is able to understand what is being said.  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  However, when the patient either attempts or self or is instructed to slow down and take her time to get her thoughts before speaking her ability to effectively express herself improved significantly.  Is recommended that her work with speech should involve helping her take time rather than impulsively respond verbally.  Patient is aware of her language deficits.  The patient does have a certain degree of impulse  control but has a positive mood state and is engaging without hesitation.  Behavioral Observation: Brandi [name]  presents as a 66 y.o.-year-old Right handed African American Female who appeared her stated age. her dress was Appropriate and she was Well Groomed and her manners were Appropriate but somewhat impulsive to the situation.  her participation was indicative of Appropriate behaviors.  There were physical disabilities noted.  she displayed an appropriate level of cooperation and motivation.     Interactions:    Active Redirectable  Attention:   abnormal and attention span appeared shorter than expected for age  Memory:   within normal limits; recent and remote memory intact  Visuo-spatial:  not examined  Speech (Volume):  normal  Speech:   non-fluent aphasia; patient presented with symptoms consistent with both frontal and parietal lobe involvement in her language deficits.  Thought Process:  Coherent and Relevant  Though Content:  WNL; not suicidal and not homicidal  Orientation:   person, place, and time/date  Judgment:   Fair  Planning:   Poor  Affect:    Appropriate  Mood:    Euthymic  Insight:   Fair  Intelligence:   low  Medical History:   Past Medical History:  Diagnosis Date   Anemia    Diabetes mellitus without complication ([place])    Hypertension    Stroke ([place]) 2013         Patient Active Problem List   Diagnosis Date Noted   Dyslipidemia  History of hypertension    Controlled type 2 diabetes mellitus with hyperglycemia, without long-term current use of insulin ([place])    AKI (acute kidney injury) ([place])    Myelopathy ([place]) 11/30/2021   Leucocytosis 11/25/2021   Acute urinary retention 11/23/2021   Leg weakness, bilateral 11/20/2021   Degenerative joint disease of spine 11/20/2021   History of CVA (cerebrovascular accident) 11/20/2021   Wheezing 10/24/2021   Anxiety 07/24/2021   Chronic kidney disease, stage 3a ([place]) 07/23/2021    Hypocapnia 07/23/2021   Iron deficiency anemia 07/23/2021   Morbid obesity ([place]) 06/16/2021   Pain in joint of right knee 05/25/2021   Acute maxillary sinusitis 05/25/2021   Chronic insomnia 05/25/2021   Mixed hyperlipidemia 05/25/2021   Seasonal allergic rhinitis 05/25/2021   Cerebrovascular accident ([place]) 05/25/2021   Essential hypertension 05/25/2021   Hypertensive disorder 05/25/2021   Small bowel mass    Symptomatic anemia 01/25/2017   Microcytic anemia 01/25/2017   DM type 2 (diabetes mellitus, type 2) ([place]) 01/25/2017   CVA (cerebral vascular accident) ([place]) 06/23/2012   HTN (hypertension) 06/23/2012   Obesity 06/23/2012   Expressive aphasia 06/23/2012   Tobacco abuse 06/23/2012     Psychiatric History:  No prior psychiatric history noted  Family Med/Psych History:  Family History  Problem Relation Age of Onset   Heart attack Father    Ulcers Mother        stomach    Impression/DX:  Brandi [name] is a 66 year old right-handed female with a past medical history that includes parasitic anemia, diabetes, hypertension, hyperlipidemia.  Patient has a history of cerebrovascular accident in 2013 and has been maintained on aspirin since that time.  Patient presented on 11/20/2021 with lower extremity weakness and progressive paraplegia.  Patient had presented to [facility] with gait instability and gait changes as well as tingling in the left foot for the prior 2 weeks.  CT/MRI of the brain did identify chronic left temporoparietal encephalomalacia with no evidence of acute abnormality.  MRA of head was was negative.  MRI cervical thoracic lumbar spine showed widespread spondylosis and stenosis.  T3-4, T4-5 disc bulge with osteophyte ridging and with moderate to severe canal stenosis.  Patient placed on steroid taper with suspected myeloneuropathy with upper thoracic sensory level.  Felt transverse myelitis relatively unlikely.  Patient assessed by therapies and due to  decreased functional mobility was admitted to CIR.  Patient has continued to have expressive language deficits noted since 2013.  Patient's MRI in 2013 following her stroke indicated left middle cerebral artery distribution acute nonhemorrhagic infarct.  Involvement included left frontal lobe, left parietal lobe, left ocular and subinsular region and left temporal lobe involvement.  There was also indications of remote infarct of the left frontal parietal junction noted in 2013.  More brain areas were involved then more captured in the most recent MRI as the patient is claustrophobic and there are some challenges of getting an extended MRI.  The patient was awake and alert as I entered the room with bright affect.  She appeared to be well oriented with good mental status but clear nonfluent aphasia.  However, her presentation was not typical of focal involvement of left frontal region/Broca's area type of aphasia.  Has reported that her expressive language functioning improves with benzodiazepine and and does have a order for as needed meds.  Patient denied any significant anxiety and denied any depressive symptomatology.  Patient reported that there are times when she has difficulty processing clearly  what others are saying and also has difficulty getting her thoughts and what she wants to express organize clearly.  While the patient reports that she has difficulties at times with word finding when she does say the word it tends to be pronounced and articulated correctly.  Assessing overall cognitive functioning the patient does appear to be aware and competent and this does appear to be primarily a language based function although there are some issues of comprehension and executive functioning involved in her deficits.  The patient is able to understand what is being said.  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   However, when the patient either attempts or self or is instructed to slow down and take her time to get her thoughts before speaking her ability to effectively express herself improved significantly.  Is recommended that her work with speech should involve helping her take time rather than impulsively respond verbally.  Patient is aware of her language deficits.  The patient does have a certain degree of impulse control but has a positive mood state and is engaging without hesitation.  Disposition/Plan:  The patient appears to improve her expressive speech if she consciously and purposefully slows down and also reports that it improves with the inclusion of a benzodiazepine.  Patient has orders for as needed alprazolam.  It would be helpful when patient is having increased expressive language deficits to instruct her to slow down and organize her thoughts first before attempting to verbalize them.  Diagnosis:    Prior history of CVA impacting left frontal, parietal and temporal regions.         Electronically Signed   _______________________ Ilean Skill, Psy.D. Clinical Neuropsychologist

## 2021-12-11 NOTE — Progress Notes (Signed)
Occupational Therapy Session Note  Patient Details  Name: Brandi [name] MRN: [id number] Date of Birth: 07-22-1956  Today's Date: 12/11/2021 OT Individual Time: 1015-1100 OT Individual Time Calculation (min): 45 min    Short Term Goals: Week 1:  OT Short Term Goal 1 (Week 1): Pt will don UB clothing with CGA OT Short Term Goal 1 - Progress (Week 1): Progressing toward goal OT Short Term Goal 2 (Week 1): Pt will complete sit > stand with max A of 1 using LRAD OT Short Term Goal 2 - Progress (Week 1): Progressing toward goal OT Short Term Goal 3 (Week 1): Pt will complete bed mobility to EOB with CGA OT Short Term Goal 3 - Progress (Week 1): Progressing toward goal  Skilled Therapeutic Interventions/Progress Updates:    Pt resting in w/c upon arrival. OT intervention with focus on w/c mobility and BUE therex to increase overall strengthening and endurance. W/c mobility with supervision for 50'. BUE therex with 4# bar-tapping 3x12, chest presses 3x12. Pt also practiced reciprocal scooting in w/c with min A and min demonstrational cues. Pt continues to required increased time to initiate tasks 2/2 residual motor planning deficits. Pt remained in w/c with all needs within reach. Belt alarm activated and son present.   Therapy Documentation Precautions:  Precautions Precautions: Fall Restrictions Weight Bearing Restrictions: No   Pain: Pt denies pain this morning   Therapy/Group: Individual Therapy  [name] 12/11/2021, 11:23 AM

## 2021-12-11 NOTE — Progress Notes (Signed)
Occupational Therapy Session Note  Patient Details  Name: Brandi [name] MRN: [id number] Date of Birth: 10-May-1956  Today's Date: 12/11/2021 OT Individual Time: 1330-1425 OT Individual Time Calculation (min): 55 min    Short Term Goals: Week 2:  OT Short Term Goal 1 (Week 2): Pt will don UB clothing with CGA OT Short Term Goal 2 (Week 2): Pt will complete sit > stand with max A of 1 using LRAD OT Short Term Goal 3 (Week 2): Pt will complete bed mobility to EOB with CGA  Skilled Therapeutic Interventions/Progress Updates:    Pt resting in bed upon arrival. OT intervention with focus on bed mobility, sitting balance, SB transfers, sit<>stand, w/c mobility, and BUE/BLE therex to increase pt's independence with BADLs. Supine>sit EOB with min A. Pt able to perform reciprocal scooting to EOB with supervision. Lateral leans EOB with close supervision. SB tranfser to w/c with CGA and +2 for safety and stabilize w/c. Sit<>stand in [place] with mod A+2 X 2. Pt able to stand from paddles with min A. BLE therex with 2# weights-kicking ball 3x12 and seated marching 3x20. BUE therex with 4# bar-tapping ball 3x12, chest presses 3x12, and overhead presses 3x10. Pt propelled w/c 44' with supervision. Pt returned to room and remained in w/c with all needs within reach. Belt alarm activated.   Therapy Documentation Precautions:  Precautions Precautions: Fall Restrictions Weight Bearing Restrictions: No   Pain:  Pt denies pain this afternoon. Pt's son states pt just received medications.   Therapy/Group: Individual Therapy  [name] 12/11/2021, 2:37 PM

## 2021-12-11 NOTE — Progress Notes (Signed)
PROGRESS NOTE   Subjective/Complaints:   Pt reports legs a somewhat stronger.  LBM yesterday x3- somewhat loose.   Says when gets anxious, uses Xanax at home- because it becomes harder to speak and cannot get her words out sometimes.   Has the order prn - hasn't had since 2/22  ROS-   Pt denies SOB, abd pain, CP, N/V/C/D, and vision changes    Objective:   No results found. No results for input(s): WBC, HGB, HCT, PLT in the last 72 hours.  Recent Labs    12/09/21 0642  NA 133*  K 4.8  CL 104  CO2 20*  GLUCOSE 110*  BUN 26*  CREATININE 1.29*  CALCIUM 9.1    Intake/Output Summary (Last 24 hours) at 12/11/2021 0810 Last data filed at 12/10/2021 1316 Gross per 24 hour  Intake 458 ml  Output --  Net 458 ml       Physical Exam: Vital Signs Blood pressure 105/66, pulse 74, temperature 97.8 F (36.6 C), temperature source Oral, resp. rate 14, height 5\' 6"  (1.676 m), weight 126 kg, SpO2 97 %.    Labs pending for today   General: awake, alert, appropriate,  sitting up in bed; appears anxious this AM; NAD HENT: conjugate gaze; oropharynx moist- poor dentition CV: regular rate; no JVD Pulmonary: CTA B/L; no W/R/R- good air movement GI: soft, NT, ND, (+)BS Psychiatric: appropriate- somewhat anxious Neurological: alert -word finding issues more evident this AM  Extremities: No clubbing, cyanosis, or edema Neurologic: Alert motor strength is /5 in bilateral deltoid, bicep, tricep, grip, 3- bilateral hip flexor, knee extensors, ankle dorsiflexor and plantar flexor    Assessment/Plan: 1. Functional deficits which require 3+ hours per day of interdisciplinary therapy in a comprehensive inpatient rehab setting. Physiatrist is providing close team supervision and 24 hour management of active medical problems listed below. Physiatrist and rehab team continue to assess barriers to discharge/monitor patient  progress toward functional and medical goals  Care Tool:  Bathing    Body parts bathed by patient: Right arm, Left arm, Chest, Abdomen, Front perineal area, Right upper leg, Left upper leg, Face   Body parts bathed by helper: Buttocks, Right lower leg, Left lower leg     Bathing assist Assist Level: Maximal Assistance - Patient 24 - 49%     Upper Body Dressing/Undressing Upper body dressing   What is the patient wearing?: Pull over shirt    Upper body assist Assist Level: Moderate Assistance - Patient 50 - 74%    Lower Body Dressing/Undressing Lower body dressing      What is the patient wearing?: Underwear/pull up     Lower body assist Assist for lower body dressing: 2 Helpers     Toileting Toileting Toileting Activity did not occur (Clothing management and hygiene only): N/A (no void or bm)  Toileting assist Assist for toileting: Total Assistance - Patient < 25%     Transfers Chair/bed transfer  Transfers assist     Chair/bed transfer assist level: Dependent - mechanical lift     Locomotion Ambulation   Ambulation assist   Ambulation activity did not occur: Safety/medical concerns  Walk 10 feet activity   Assist  Walk 10 feet activity did not occur: Safety/medical concerns        Walk 50 feet activity   Assist Walk 50 feet with 2 turns activity did not occur: Safety/medical concerns         Walk 150 feet activity   Assist Walk 150 feet activity did not occur: Safety/medical concerns         Walk 10 feet on uneven surface  activity   Assist Walk 10 feet on uneven surfaces activity did not occur: Safety/medical concerns         Wheelchair     Assist Is the patient using a wheelchair?: Yes Type of Wheelchair: Manual    Wheelchair assist level: Minimal Assistance - Patient > 75% Max wheelchair distance: 72'    Wheelchair 50 feet with 2 turns activity    Assist        Assist Level: Minimal  Assistance - Patient > 75%   Wheelchair 150 feet activity     Assist      Assist Level: Moderate Assistance - Patient 50 - 74%   Blood pressure 105/66, pulse 74, temperature 97.8 F (36.6 C), temperature source Oral, resp. rate 14, height 5\' 6"  (1.676 m), weight 126 kg, SpO2 97 %.  Medical Problem List and Plan: 1. 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 - Decadron taper             -patient may shower             -ELOS/Goals: 10-12 days, supervision goals with PT and OT  D/c 3/14  Con't CIR_ PT and OT- [facility] says Surgical intervention is possible- will likely need to check with family? 2.  Antithrombotics: -DVT/anticoagulation:  Pharmaceutical: Heparin.               -antiplatelet therapy: Aspirin 325 mg daily 3. Pain Management: Oxycodone as needed Knee pain 2019 xray show severe OA on RIght and Moderate on Left  OA will add voltaren gel   Changed Oxy to q4 hours prn  2/24- pain doing somewhat better 4. Mood: Trazodone 150 mg nightly, Xanax 0.25 mg twice daily as needed  2/24- bright affect- cont regimen  2/27- encouraged pt to ask for Xanax.              -antipsychotic agents: N/A 5. Neuropsych: This patient is capable of making decisions on his own behalf. 6. Skin/Wound Care: Routine skin checks             -encourage appropriate nutrition              -pressure relief 7. Fluids/Electrolytes/Nutrition:  .               -add protein supp for low albumin 8.  Diabetes mellitus.  Hemoglobin A1c 6.0.                CBG (last 3)       Controlled on 2/25 9.  Hypertension.  Lopressor 25 mg twice daily, hydralazine 25 mg every 8 hours.      Off all BP meds  Soft, but asymptomatic on 2/25  2/27- BP doing better- 130/65- will con't off meds Vitals:   12/10/21 2035 12/11/21 0603  BP: 130/65 105/66  Pulse: 79 74  Resp: 18 14  Temp: 98.6 F (37 C) 97.8 F (36.6 C)  SpO2: 98% 97%    10.  Hyperlipidemia.  Zocor 11.  Microcytic anemia.  Continue iron  supplement.  Follow-up CBC 12.  Acute urinary retention/neurogenic bladder.  Foley tube removed.   -Presently on Flomax 0.4 mg.   -need pvr's  2/21- last cathed 2/19 early AM- but bladder scans <150 cc since then- off PVRs right now 13.  Constipation   -Senokot twice daily, MiraLAX daily Changed Senna to 2 tabs BID She notes improvement in bowel movements and states she did not take the laxatives yesterday 2/27- reduced laxatives to 3 pills/day. Since was having loose stools- 3 Bms yesterdays.  14.  Obesity.  BMI 39.5.  Dietary follow-up 15.  History of CVA.  Modified independent with straight point cane prior to admission.  Continue aspirin 16.  Leukocytosis.  Suspect steroid-induced--improved to 9.6 2/17  2/20- WBC down to 7.8k- con't regimen 17. AKI  2/20- Cr up to 1.56 and BUN up to 31- will recheck Wednesday and push fluids- hopefully will help BP and Cr.   2/24- BUN still 29 and Cr 1.37- will give IVFs since pushing lfuids isn't working- and recheck in AM- IVFs NS 75 cc/hour x 24 hours  Creatinine 1.29 on 2/25, improving  2/27- Cr/labs pending this AM- will monitor 18. Abrasion L buttock  2/22- doesn' thave pressure ulcer- per my exam nor WOC- will con't wound care.  19.  Hyponatremia  Sodium 133 on 2/25-stable  2/27- labs pending    I spent a total of  35  minutes on total care today- >50% coordination of care- due to d/w nursing about care and seeing pt.    LOS: 11 days A FACE TO FACE EVALUATION WAS PERFORMED  Brandi [name] 12/11/2021, 8:10 AM

## 2021-12-12 DIAGNOSIS — G959 Disease of spinal cord, unspecified: Secondary | ICD-10-CM | POA: Diagnosis not present

## 2021-12-12 DIAGNOSIS — L8990 Pressure ulcer of unspecified site, unspecified stage: Secondary | ICD-10-CM | POA: Insufficient documentation

## 2021-12-12 LAB — GLUCOSE, CAPILLARY
Glucose-Capillary: 111 mg/dL — ABNORMAL HIGH (ref 70–99)
Glucose-Capillary: 105 mg/dL — ABNORMAL HIGH (ref 70–99)
Glucose-Capillary: 100 mg/dL — ABNORMAL HIGH (ref 70–99)
Glucose-Capillary: 106 mg/dL — ABNORMAL HIGH (ref 70–99)

## 2021-12-12 MED ORDER — SENNA 8.6 MG PO TABS
1.0000 | ORAL_TABLET | Freq: Every day | ORAL | Status: DC
  Administered 2021-12-12 – 2021-12-14 (×3): 8.6 mg via ORAL
  Filled 2021-12-12 (×2): qty 1

## 2021-12-12 NOTE — Progress Notes (Signed)
Patient ID: Brandi [name], female   DOB: July 05, 1956, 66 y.o.   MRN: [id number]  SW met with pt and [name] in room to provide updates from team conference. Pt d/c date remains 12/26/21. Pt son confirms his mother will d/c to home. He will have support from his children's mother. He confirms the ramp was finished yesterday. He intends to look into bariatric DABSC, TTB, and hospital bed and will let SW know by Thursday what DME needs to be ordered.   SW will continue to follow-up.   Loralee Pacas, MSW, [place] Office: [phone] Cell: [phone] Fax: [phone]

## 2021-12-12 NOTE — Progress Notes (Signed)
I stop by and saw her again today because the son was in the room and no one to discuss her situation with her son.  She continues to have at least antigravity and likely stronger strength in the lower extremities this morning.  She feels like she is "better."  I have discussed the situation at length with the son and with her present.  The difficulty is knowing whether or not surgical intervention is warranted.  He states he has "talked to people outside of the hospital" and he feels like it may be too risky.  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  I simply think it is the most likely etiology of her leg weakness though her presentation would be very atypical.  He states "but it is not 100% guarantee, and therefore it is too risky."  I do not believe they are very interested in surgical intervention at this time, and that is certainly understandable.  I explained that she may or may not need some type of decompressive surgery at some point, but it seems like we all feel there is not enough guarantee that it is warranted at this time to put her through the risks of surgery presently.  Continue therapy.  Hope for improvement.  I will continue to check on her intermittently.

## 2021-12-12 NOTE — Patient Care Conference (Signed)
Inpatient RehabilitationTeam Conference and Plan of Care Update Date: 12/12/2021   Time: 11:01 AM    Patient Name: Brandi [name]      Medical Record Number: [id number]  Date of Birth: 05-May-1956 Sex: Female         Room/Bed: 4M05C/4M05C-01 Payor Info: Payor: [employer] MEDICARE / Plan: UHC MEDICARE / Product Type: *No Product type* /    Admit Date/Time:  11/30/2021  5:17 PM  Primary Diagnosis:  Myelopathy [facility])  Hospital Problems: Principal Problem:   Myelopathy ([place]) Active Problems:   Dyslipidemia   History of hypertension   Controlled type 2 diabetes mellitus with hyperglycemia, without long-term current use of insulin ([place])   AKI (acute kidney injury) ([place])   Pressure injury of skin    Expected Discharge Date: Expected Discharge Date: 12/26/21  Team Members Present: Physician leading conference: Dr. Courtney Heys Social Worker Present: Loralee Pacas, [place] Nurse Present: Dorthula Nettles, RN PT Present: Excell Seltzer, PT OT Present: Roanna Epley, COTA;Jennifer Tamala Julian, OT PPS Coordinator present : Gunnar Fusi, SLP     Current Status/Progress Goal Weekly Team Focus  Bowel/Bladder   Continent times 2 Last [facility] 2/27  Remain continent  Toilet Q 2 hours Keep Pt clean and dry   Swallow/Nutrition/ Hydration             ADL's   bed mobility-mod/max A; SB tranfsers-min A or dependent with Stedy; LB bathing/dressing at bed level with max A; UB bathing/dressing-min A; toileting-dependent  min A overall w/c level  toileting, LB ADLs, education, bed mobility   Mobility   min A rolling, mod to max A supine to/from sit, mod A x 1 and min A x 1 for SB transfers vs dependent with stedy, min A w/c mobility 50-100 ft  min to mod A at w/c level  sitting balance, transfers, w/c mobility, LE NMR and strengthening, standing as safe and able   Communication             Safety/Cognition/ Behavioral Observations            Pain   8/10 Buttock and bilaterally knee pain  assess  pain and maintain level at 2/10  Pain controlled with Oxy, and Tylenol.   Skin   abrasion on L buttock covered with foam dressing  Keep skin intact, clean, and dry  Assess for new skin breakdown, prevent new injuries     Discharge Planning:  Pt to d/c tohome with her son [name] who will provide 24/7 care. Patient will confirm no barriers to discharge.   Team Discussion: Surgery talked to patient and son, decided against surgery. CBG's good, BP soft at times, asymptomatic. Incontinent B/B. Reports 10/10 pain, improves after pain medication. Only abrasion to buttocks, not stage 2. Discharging home with son, ramp almost complete.  Patient on target to meet rehab goals: yes, min/mod assist at [facility] level. Currently +2 for safety. Supervision 150 ft WC level. Bathing and dressing at bed level. Lower body max assist. Rolling has improved. +2 with slide board.  *See Care Plan and progress notes for long and short-term goals.   Revisions to Treatment Plan:  None at this time.   Teaching Needs: Family education, medication/pain management, skin/wound care, skin/wound care, transfer training, etc.   Current Barriers to Discharge: Incontinence  Possible Resolutions to Barriers: Family education Time toileting schedule     Medical Summary Current Status: has ABRASIOn on L buttock- not pressure ulcer- incontinent B/B due to timing- can tell;  c/o  10/10 pain to nursing- but doesn't c/o pain to MD and therapy;  Barriers to Discharge: Home enviroment access/layout;Incontinence;Medical stability;Wound care;Weight bearing restrictions;Weight;Other (comments)  Barriers to Discharge Comments: home with son- getting ramp- -doesn't want to go to SNF (pt)- a LOT of care-  hx of stroke with poor motor planning. Possible Resolutions to [employer] Focus: pt/son deciced against surgical intervention-+2 with sliding board transfers- w/c Supervision- legs real weak- will be at w/c level- ADLs bed level- D  for toileting/hygiene- d/c 3/14   Continued Need for Acute Rehabilitation Level of Care: The patient requires daily medical management by a physician with specialized training in physical medicine and rehabilitation for the following reasons: Direction of a multidisciplinary physical rehabilitation program to maximize functional independence : Yes Medical management of patient stability for increased activity during participation in an intensive rehabilitation regime.: Yes Analysis of laboratory values and/or radiology reports with any subsequent need for medication adjustment and/or medical intervention. : Yes   I attest that I was present, lead the team conference, and concur with the assessment and plan of the team.   Cristi Loron 12/12/2021, 4:29 PM

## 2021-12-12 NOTE — Progress Notes (Signed)
Occupational Therapy Session Note  Patient Details  Name: Brandi [name] MRN: [id number] Date of Birth: 1955-11-03  Today's Date: 12/12/2021 OT Individual Time: 1315-1411 OT Individual Time Calculation (min): 56 min  and Today's Date: 12/12/2021 OT Missed Time: 45 Minutes Missed Time Reason: Other (comment) (schedule conflict)   Short Term Goals: Week 1:  OT Short Term Goal 1 (Week 1): Pt will don UB clothing with CGA OT Short Term Goal 1 - Progress (Week 1): Progressing toward goal OT Short Term Goal 2 (Week 1): Pt will complete sit > stand with max A of 1 using LRAD OT Short Term Goal 2 - Progress (Week 1): Progressing toward goal OT Short Term Goal 3 (Week 1): Pt will complete bed mobility to EOB with CGA OT Short Term Goal 3 - Progress (Week 1): Progressing toward goal Week 2:  OT Short Term Goal 1 (Week 2): Pt will don UB clothing with CGA OT Short Term Goal 2 (Week 2): Pt will complete sit > stand with max A of 1 using LRAD OT Short Term Goal 3 (Week 2): Pt will complete bed mobility to EOB with CGA   Skilled Therapeutic Interventions/Progress Updates:    Pt greeted at time of session sitting up in wheelchair agreeable to OT session with son in room who did not remain for OT session. Pt initially requesting to perform ADL tasks for bathing and dressing, agreeable to sink level bathing. Once explained that this would be her OT session, pt changed her mind and did not want to do ADL tasks instead wanting to do there ex. Transported to hall and self propel to ortho gym (approx 75 ft) with Supervision and needing assist to make the tight turn into ortho gym. Positioned at standing frame and with multiple trials of standing in frame, partial squats and isometric hold in hip extension for 5-10 second intervals. Attempted chest press with 2# dowel in standing frame, but unable to maintain position and downgraded to UE ROM only no bar for resistance. Pt with knee pain, returned to sitting in  chair and switched to BUE there ex with 2x10 wheelchair pushups, 1x15 chest press, overhead press, and bicep curl with 2# dowel and therapist added resistance. Transported back to room, total A for slide board placement and Min/CGA for slide board to L side. Doffed shirt seated and donned new gown. Sit > supine with Mod A and doffed pants with total A with rolling L/R. Pt in bed resting alarm on call bell in reach. Missed 19 minutes total 2/2 schedule conflict.   Therapy Documentation Precautions:  Precautions Precautions: Fall Restrictions Weight Bearing Restrictions: No     Therapy/Group: Individual Therapy  Viona Gilmore 12/12/2021, 3:13 PM

## 2021-12-12 NOTE — Progress Notes (Signed)
Physical Therapy Session Note  Patient Details  Name: Brandi [name] MRN: [id number] Date of Birth: [birth year]  Today's Date: 12/12/2021 PT Individual Time: 1000-1055 PT Individual Time Calculation (min): 55 min   Short Term Goals: Week 2:  PT Short Term Goal 1 (Week 2): Pt will perform sit to stand with max A and LRAD PT Short Term Goal 2 (Week 2): Pt will perform least restrictive transfer with assist x 1 PT Short Term Goal 3 (Week 2): Pt will perform bed mobility with mod A consistently  Skilled Therapeutic Interventions/Progress Updates:    Pt received seated on [facility] with son present. Nursing then in room to assist with pericare and brief change following void of BM on BSC. Pt is max to total A x 2 to stand in stedy from [facility]. Stedy transfer to w/c. Manual w/c propulsion x 150 ft, x 100 ft with use of BUE at Supervision level. Pt exhibits improved ability to propel w/c this date. Sit to stand in standing frame x 3 reps. Standing mini-squats 2 x 10 reps from semi-perched position in sling to full upright stance with focus on use of BLE extension to achieve stance vs pulling up with her UE. Pt fatigues very quickly with standing. Pt left seated in w/c in room with needs in reach, son present at end of session.  Therapy Documentation Precautions:  Precautions Precautions: Fall Restrictions Weight Bearing Restrictions: No      Therapy/Group: Individual Therapy   Excell Seltzer, PT, DPT, CSRS  12/12/2021, 12:15 PM

## 2021-12-12 NOTE — Progress Notes (Signed)
PROGRESS NOTE   Subjective/Complaints:   Pt reports bowels working too well- every time she eats, she has a loose stool. "Right through her".    ROS-   Pt denies SOB, abd pain, CP, N/V/C//(+) loose stools/D, and vision changes     Objective:   No results found. Recent Labs    12/11/21 0709  WBC 3.3*  HGB 12.1  HCT 39.3  PLT 263    Recent Labs    12/11/21 0709  NA 135  K 4.2  CL 102  CO2 24  GLUCOSE 102*  BUN 19  CREATININE 1.25*  CALCIUM 9.5    Intake/Output Summary (Last 24 hours) at 12/12/2021 0759 Last data filed at 12/11/2021 1434 Gross per 24 hour  Intake 118 ml  Output --  Net 118 ml       Physical Exam: Vital Signs Blood pressure 106/75, pulse 80, temperature 98.1 F (36.7 C), resp. rate 17, height 5\' 6"  (1.676 m), weight 126 kg, SpO2 98 %.     General: awake, alert, appropriate, laying supine in bed; son at bedside; NAD HENT: conjugate gaze; oropharynx moist CV: regular rate; no JVD Pulmonary: CTA B/L; no W/R/R- good air movement GI: soft, NT, ND, (+)BS- slightly hyperactive Psychiatric: appropriate- less anxious Neurological: alert- less word finding deficits noted this AM   Extremities: No clubbing, cyanosis, or edema Neurologic: Alert motor strength is /5 in bilateral deltoid, bicep, tricep, grip, 3- bilateral hip flexor, knee extensors, ankle dorsiflexor and plantar flexor    Assessment/Plan: 1. Functional deficits which require 3+ hours per day of interdisciplinary therapy in a comprehensive inpatient rehab setting. Physiatrist is providing close team supervision and 24 hour management of active medical problems listed below. Physiatrist and rehab team continue to assess barriers to discharge/monitor patient progress toward functional and medical goals  Care Tool:  Bathing    Body parts bathed by patient: Right arm, Left arm, Chest, Abdomen, Front perineal area, Right  upper leg, Left upper leg, Face   Body parts bathed by helper: Buttocks, Right lower leg, Left lower leg     Bathing assist Assist Level: Maximal Assistance - Patient 24 - 49%     Upper Body Dressing/Undressing Upper body dressing   What is the patient wearing?: Pull over shirt    Upper body assist Assist Level: Moderate Assistance - Patient 50 - 74%    Lower Body Dressing/Undressing Lower body dressing      What is the patient wearing?: Underwear/pull up     Lower body assist Assist for lower body dressing: 2 Helpers     Toileting Toileting Toileting Activity did not occur (Clothing management and hygiene only): N/A (no void or bm)  Toileting assist Assist for toileting: Total Assistance - Patient < 25%     Transfers Chair/bed transfer  Transfers assist     Chair/bed transfer assist level: 2 Helpers (via slide board)     Locomotion Ambulation   Ambulation assist   Ambulation activity did not occur: Safety/medical concerns          Walk 10 feet activity   Assist  Walk 10 feet activity did not occur: Safety/medical concerns  Walk 50 feet activity   Assist Walk 50 feet with 2 turns activity did not occur: Safety/medical concerns         Walk 150 feet activity   Assist Walk 150 feet activity did not occur: Safety/medical concerns         Walk 10 feet on uneven surface  activity   Assist Walk 10 feet on uneven surfaces activity did not occur: Safety/medical concerns         Wheelchair     Assist Is the patient using a wheelchair?: Yes Type of Wheelchair: Manual    Wheelchair assist level: Minimal Assistance - Patient > 75% Max wheelchair distance: 7'    Wheelchair 50 feet with 2 turns activity    Assist        Assist Level: Minimal Assistance - Patient > 75%   Wheelchair 150 feet activity     Assist      Assist Level: Moderate Assistance - Patient 50 - 74%   Blood pressure 106/75, pulse 80,  temperature 98.1 F (36.7 C), resp. rate 17, height 5\' 6"  (1.676 m), weight 126 kg, SpO2 98 %.  Medical Problem List and Plan: 1. 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 - Decadron taper             -patient may shower             -ELOS/Goals: 10-12 days, supervision goals with PT and OT  D/c 3/14   [facility] says Surgical intervention is possible- will likely need to check with family?  2/28- surgeon seeing pt/son this AM- con't CIR- PT and OT 2.  Antithrombotics: -DVT/anticoagulation:  Pharmaceutical: Heparin.               -antiplatelet therapy: Aspirin 325 mg daily 3. Pain Management: Oxycodone as needed Knee pain 2019 xray show severe OA on RIght and Moderate on Left  OA will add voltaren gel   Changed Oxy to q4 hours prn  2/28- pain better- less complaints- con't regimen 4. Mood: Trazodone 150 mg nightly, Xanax 0.25 mg twice daily as needed 2/28- d/w pt again that has to ask for meds- also let son know              -antipsychotic agents: N/A 5. Neuropsych: This patient is capable of making decisions on her own behalf. 6. Skin/Wound Care: Routine skin checks             -encourage appropriate nutrition              -pressure relief 7. Fluids/Electrolytes/Nutrition:  .               -add protein supp for low albumin 8.  Diabetes mellitus.  Hemoglobin A1c 6.0.                CBG (last 3)       Controlled on 2/25  2/28- CBGs controlled- con't reigmen 9.  Hypertension.  Lopressor 25 mg twice daily, hydralazine 25 mg every 8 hours.      Off all BP meds  Soft, but asymptomatic on 2/25  2/27- BP doing better- 130/65- will con't off meds  2/28- BP 120s/60s- con't off meds Vitals:   12/11/21 2003 12/12/21 0508  BP: 121/66 106/75  Pulse: 89 80  Resp: 14 17  Temp: 97.8 F (36.6 C) 98.1 F (36.7 C)  SpO2: 99% 98%    10.  Hyperlipidemia.  Zocor 11.  Microcytic anemia.  Continue iron supplement.  Follow-up CBC 12.  Acute urinary retention/neurogenic bladder.  Foley tube  removed.   -Presently on Flomax 0.4 mg.   -need pvr's  2/21- last cathed 2/19 early AM- but bladder scans <150 cc since then- off PVRs right now 13.  Constipation   -Senokot twice daily, MiraLAX daily Changed Senna to 2 tabs BID She notes improvement in bowel movements and states she did not take the laxatives yesterday 2/27- reduced laxatives to 3 pills/day. Since was having loose stools- 3 Bms yesterdays.  2/28- Reduced senna to 1 tab/day since on pain meds- but should reduce such frequent BM's.  14.  Obesity.  BMI 39.5.  Dietary follow-up 15.  History of CVA.  Modified independent with straight point cane prior to admission.  Continue aspirin 16.  Leukocytosis.  Suspect steroid-induced--improved to 9.6 2/17  2/20- WBC down to 7.8k- con't regimen  2/28- WBC down to 3.3- will monitor 17. AKI  2/20- Cr up to 1.56 and BUN up to 31- will recheck Wednesday and push fluids- hopefully will help BP and Cr.   2/24- BUN still 29 and Cr 1.37- will give IVFs since pushing lfuids isn't working- and recheck in AM- IVFs NS 75 cc/hour x 24 hours  Creatinine 1.29 on 2/25, improving  2/27- Cr/labs pending this AM- will monitor  2/28- Cr 1.25- BUN 19- not dry anymore- con't to monitor 18. Abrasion L buttock  2/22- doesn' thave pressure ulcer- per my exam nor WOC- will con't wound care.  19.  Hyponatremia  Sodium 133 on 2/25-stable  2/28- Na 135-   I spent a total of  38  minutes on total care today- >50% coordination of care- due to team conference and d/w son about possible surgical intervention.     LOS: 12 days A FACE TO FACE EVALUATION WAS PERFORMED  Margareth Kanner 12/12/2021, 7:59 AM

## 2021-12-12 NOTE — Progress Notes (Signed)
Occupational Therapy Session Note  Patient Details  Name: Brandi [name] MRN: [id number] Date of Birth: May 05, 1956  Today's Date: 12/12/2021 OT Individual Time: 0800-0910 OT Individual Time Calculation (min): 70 min    Short Term Goals: Week 2:  OT Short Term Goal 1 (Week 2): Pt will don UB clothing with CGA OT Short Term Goal 2 (Week 2): Pt will complete sit > stand with max A of 1 using LRAD OT Short Term Goal 3 (Week 2): Pt will complete bed mobility to EOB with CGA  Skilled Therapeutic Interventions/Progress Updates:    Pt resting in bed upon arrival with son present. Pt already donned pullover shirt but required assistance donning pants at bed level. Rolling R/L in bed with mod A. Pt tot A for donning pants at bed level. Supine>sit EOB with mod A using bed rails. Sitting balance EOB with supervision. Pt able to preform reciprocal scoots to EOB in preparation for placement of SB for tranfser. Pt performs lateral lean with CGA to facilitate placement of SB. SB tranfser to w/c with min A +2 for safety. Pt able to reposition in w/c without assistance. BUE/BLE therex w/c level. 2.5# ankle weight donned on LLE and pt kicked large therapy ball 3x10 with BLE. Small ball placed between knees-squeezes 3x10. BUE therex with 4# bar: chest presses 3x12 and overhead presses 3x12. Pt also completed therapy ball taps 3x12. Pt propelled w/c 23' with supervisoin. Pt returned to room and remained in w/c. All needs within reach and son present.   Therapy Documentation Precautions:  Precautions Precautions: Fall Restrictions Weight Bearing Restrictions: No   Pain: Pt reports that her Rt knee "feels stiff"; PROM/AROM with report of "feeling better"   Therapy/Group: Individual Therapy  [name] 12/12/2021, 9:16 AM

## 2021-12-13 DIAGNOSIS — G959 Disease of spinal cord, unspecified: Secondary | ICD-10-CM | POA: Diagnosis not present

## 2021-12-13 LAB — GLUCOSE, CAPILLARY
Glucose-Capillary: 93 mg/dL (ref 70–99)
Glucose-Capillary: 99 mg/dL (ref 70–99)
Glucose-Capillary: 110 mg/dL — ABNORMAL HIGH (ref 70–99)
Glucose-Capillary: 119 mg/dL — ABNORMAL HIGH (ref 70–99)

## 2021-12-13 NOTE — Progress Notes (Signed)
Occupational Therapy Weekly Progress Note ? ?Patient Details  ?Name: Brandi [name] ?MRN: [id number] ?Date of Birth: [birth year] ? ?Beginning of progress report period: December 06, 2021 ?End of progress report period: December 13, 2021 ? ? ?Patient has met 1 of 3 short term goals.  Pt has made slow but steady progress with functional transfers and BADLs during the past week. Pt requires mod A for bed mobility (supine>sit in hospital bed). Pt currently completed bathing/dressing at bed level-bathing with mod A, UB dressing with CGA, and LB dressing with tot A. SB tranfsers to [facility] with mod A+2 and dependent for toileting tasks. Pt's son has been present during therapy sessions. ? ?Patient continues to demonstrate the following deficits: muscle weakness, decreased cardiorespiratoy endurance, impaired timing and sequencing, abnormal tone, decreased coordination, and decreased motor planning, decreased motor planning, and decreased sitting balance, decreased standing balance, decreased postural control, hemiplegia, and decreased balance strategies and therefore will continue to benefit from skilled OT intervention to enhance overall performance with BADL and Reduce care partner burden. ? ?Patient progressing toward long term goals..  Continue plan of care. ? ?OT Short Term Goals ?Week 2:  OT Short Term Goal 1 (Week 2): Pt will don UB clothing with CGA ?OT Short Term Goal 1 - Progress (Week 2): Met ?OT Short Term Goal 2 (Week 2): Pt will complete sit > stand with max A of 1 using LRAD ?OT Short Term Goal 2 - Progress (Week 2): Progressing toward goal ?OT Short Term Goal 3 (Week 2): Pt will complete bed mobility to EOB with CGA ?OT Short Term Goal 3 - Progress (Week 2): Progressing toward goal ?OT Short Term Goal 4 (Week 2): Pt will complete SB DABSC transfer with mod A+1 ?OT Short Term Goal 5 (Week 2): Pt will complete LB dressing tasks with max A at bed level ?Week 3:  OT Short Term Goal 1 (Week 3): Pt will complete SB  DABSC transfer with mod A+1 ?OT Short Term Goal 2 (Week 3): Pt will complete bed mobility to EOB with CGA ?OT Short Term Goal 3 (Week 3): Pt will complete LB dressing tasks with max A at bed level ? ?  ?[name] ?12/13/2021, 6:32 AM  ?

## 2021-12-13 NOTE — Progress Notes (Signed)
Occupational Therapy Session Note ? ?Patient Details  ?Name: Brandi [name] ?MRN: [id number] ?Date of Birth: 10/22/1955 ? ?Today's Date: 12/13/2021 ?OT Individual Time: 9179-1505 ?OT Individual Time Calculation (min): 55 min  ? ? ?Short Term Goals: ?Week 3:  OT Short Term Goal 1 (Week 3): Pt will complete SB DABSC transfer with mod A+1 ?OT Short Term Goal 2 (Week 3): Pt will complete bed mobility to EOB with CGA ?OT Short Term Goal 3 (Week 3): Pt will complete LB dressing tasks with max A at bed level ? ?Skilled Therapeutic Interventions/Progress Updates:  ?  Pt resting in bed upon arrival and ready to get OOB. Supine>sit EOB with assistance only for moving RLE off EOB. Pt able to push to upright with HOB elevated 15*. Pt completed reciprocal scooting to EOB with supervision. SB tranfser to w/c with CGA. Pt propelled w/c to gym with supervision. W/c activities included tossing ball and 1kg ball against rebounder 3x20. BUE therex with 4# bar-overhead presses 3x10. Pt propelled w/c back to room with supervision. Pt remained in w/c with all needs within reach and belt alarm activated.  ? ?Therapy Documentation ?Precautions:  ?Precautions ?Precautions: Fall ?Restrictions ?Weight Bearing Restrictions: No ?  ?Pain: ? Pt denies pain this afternoon ? ? ?Therapy/Group: Individual Therapy ? ?[name] ?12/13/2021, 2:00 PM ?

## 2021-12-13 NOTE — Progress Notes (Signed)
Occupational Therapy Session Note ? ?Patient Details  ?Name: Brandi [name] ?MRN: [id number] ?Date of Birth: [birth year] ? ?Today's Date: 12/13/2021 ?OT Individual Time: [phone] ?OT Individual Time Calculation (min): 73 min  ? ? ?Short Term Goals: ?Week 3:  OT Short Term Goal 1 (Week 3): Pt will complete SB DABSC transfer with mod A+1 ?OT Short Term Goal 2 (Week 3): Pt will complete bed mobility to EOB with CGA ?OT Short Term Goal 3 (Week 3): Pt will complete LB dressing tasks with max A at bed level ? ?Skilled Therapeutic Interventions/Progress Updates:  ?  Pt resting in bed upon arrival. Pt requested bed pan. Tot A for placement of bed pan. Rolling in bed with min A. LB dressing at bed level with tot A. Supine>sit EOB with mod A. SB transfer to w/c with CGA. Pt completed UB bathing/dressing w/c level at sink. Assistance to fasten bra. W/c mobility 41' with supervision. BUE therex with 5# bar-ball taps 3x12, chest presses 3x10 and rowing 3x10. Pt propelled w/c back to room. Pt remained in w/c with all needs within reach and belt alarm activated.  ? ?Therapy Documentation ?Precautions:  ?Precautions ?Precautions: Fall ?Restrictions ?Weight Bearing Restrictions: No ?  ?Pain: ?Pt denies pain this morning ? ?Therapy/Group: Individual Therapy ? ?[name] ?12/13/2021, 9:31 AM ?

## 2021-12-13 NOTE — Progress Notes (Signed)
Physical Therapy Session Note ? ?Patient Details  ?Name: Brandi [name] ?MRN: [id number] ?Date of Birth: 10/06/1956 ? ?Today's Date: 12/13/2021 ?PT Individual Time: [id number] ?PT Individual Time Calculation (min): 45 min  ? ?Short Term Goals: ?Week 2:  PT Short Term Goal 1 (Week 2): Pt will perform sit to stand with max A and LRAD ?PT Short Term Goal 2 (Week 2): Pt will perform least restrictive transfer with assist x 1 ?PT Short Term Goal 3 (Week 2): Pt will perform bed mobility with mod A consistently ? ?Skilled Therapeutic Interventions/Progress Updates: Pt presented in w/c agreeable to therapy. Pt denies pain at rest, but seen intermittently rubbing L knee. Pt transported to day room for time management and participated in Cybex Kinetron at 70, 60, and 50cm/sec each at 1 min intervals. Pt  noted to have improved ROM in RLE with increased resistance. Pt then moved over to high/low mat and performed Slide board transfer to R with light minA. Pt demonstrating improved awareness of foot placement and increased truncal flexion when PTA facilitating via Bobath technique. Pt then participated in lateral leans x 5 bilaterally with pt noted to use BUE to push up from mat vs reaching out for object to grab and pull. On last lean pt was able to perform hip flexion x 3 on contralateral lean to simulate when placing slideboard. PTA then obtained Stedy and placed in reverse with pt performed anterior lean to BLE activation in preparation for standing. Pt required mod to max A x 2 for safety and to facilitation anterior lean. Pt then transferred back to w/c in same manner as prior and returned to room. Performed Slide board transfer to L to bed with minA!Marland Kitchen PTA doffed shoes and pt performed sit to supine with modA. Pt was able to reposition to middle of bed with supervision and use of bed rails. Pt left in bed at end of session with bed alarm on, call bell within reach and needs met.  ?   ? ?Therapy Documentation ?Precautions:   ?Precautions ?Precautions: Fall ?Restrictions ?Weight Bearing Restrictions: No ?General: ?  ?Vital Signs: ?Therapy Vitals ?Temp: 98.2 ?F (36.8 ?C) ?Pulse Rate: 93 ?Resp: 18 ?BP: 112/77 ?Patient Position (if appropriate): Sitting ?Oxygen Therapy ?SpO2: 99 % ?O2 Device: Room Air ?Pain: ?  ?Mobility: ?  ?Locomotion : ?   ?Trunk/Postural Assessment : ?   ?Balance: ?  ?Exercises: ?  ?Other Treatments:   ? ? ? ?Therapy/Group: Individual Therapy ? ?Aariyana Manz ?12/13/2021, 4:12 PM  ?

## 2021-12-13 NOTE — Progress Notes (Addendum)
RN rounding complete. Pain assessed. 10/10 buttock pain. PRN Oxy given. Pt repositioned and turned from left to right and back to supine. Legs elevated, and pillows to offload buttock pressure. Pt used bedpan to urinate large amount of foul smelling, cloudy urine. Pericare, skin protectant, and new foam applied to L buttock.. RN will reassess pain. No other needs at this time.  ? ?Pt had an uneventful night without any distress.  ?

## 2021-12-13 NOTE — Progress Notes (Signed)
Physical Therapy Session Note ? ?Patient Details  ?Name: Brandi [name] ?MRN: [id number] ?Date of Birth: 1956/02/21 ? ?Today's Date: 12/13/2021 ?PT Individual Time: 1130-1200 ?PT Individual Time Calculation (min): 30 min  ? ?Short Term Goals: ?Week 2:  PT Short Term Goal 1 (Week 2): Pt will perform sit to stand with max A and LRAD ?PT Short Term Goal 2 (Week 2): Pt will perform least restrictive transfer with assist x 1 ?PT Short Term Goal 3 (Week 2): Pt will perform bed mobility with mod A consistently ? ?Skilled Therapeutic Interventions/Progress Updates:  ?  Pt received seated in w/c in room, agreeable to PT session. No complaints of pain. Dependent transport via w/c to therapy gym for time and energy conservation. Session focus on increasing independence with slide board transfers. Slide board transfer w/c to/from mat table with min to mod A during session via Bobath technique. Pt exhibits improved independence with slide board transfers this date. Manual w/c propulsion x 100 ft with use of BUE at Supervision level. Pt left seated in w/c in room with needs in reach, quick release belt and chair alarm in place. ? ?Therapy Documentation ?Precautions:  ?Precautions ?Precautions: Fall ?Restrictions ?Weight Bearing Restrictions: No ? ? ? ? ? ? ?Therapy/Group: Individual Therapy ? ? ?Excell Seltzer, PT, DPT, CSRS ? ?12/13/2021, 12:10 PM  ?

## 2021-12-13 NOTE — Progress Notes (Signed)
?                                                       PROGRESS NOTE ? ? ?Subjective/Complaints: ? ? ?Pt reports needs pain meds- tailbone hurting- has called for meds.  ?Knee pain is a little better.  ?LBM yesterday- still needs to have BM right after eats.  ? ? ? ?ROS-  ? ?Pt denies SOB, abd pain, CP, N/V/C/D, and vision changes ? ? ? ? ?Objective: ?  ?No results found. ?Recent Labs  ?  12/11/21 ?0709  ?WBC 3.3*  ?HGB 12.1  ?HCT 39.3  ?PLT 263  ? ? ?Recent Labs  ?  12/11/21 ?0709  ?NA 135  ?K 4.2  ?CL 102  ?CO2 24  ?GLUCOSE 102*  ?BUN 19  ?CREATININE 1.25*  ?CALCIUM 9.5  ? ? ?Intake/Output Summary (Last 24 hours) at 12/13/2021 0804 ?Last data filed at 12/12/2021 1715 ?Gross per 24 hour  ?Intake 200 ml  ?Output --  ?Net 200 ml  ?  ? ? ? ?Physical Exam: ?Vital Signs ?Blood pressure 111/63, pulse 82, temperature 98.2 ?F (36.8 ?C), resp. rate 16, height 5\' 6"  (1.676 m), weight 126 kg, SpO2 98 %. ? ? ? ? ? ?General: awake, alert, appropriate, sitting up in bed; shifting to get weight off buttocks; NAD ?HENT: conjugate gaze; oropharynx moist ?CV: regular rate; no JVD ?Pulmonary: CTA B/L; no W/R/R- good air movement ?GI: soft, NT, ND, (+)BS ?Psychiatric: appropriate- bright affect ?Neurological: alert- word finding issues and a lot of repetition noted, but is stable ? ?Extremities: No clubbing, cyanosis, or edema ?Neurologic: Alert ?motor strength is /5 in bilateral deltoid, bicep, tricep, grip, 3- bilateral hip flexor, knee extensors, ankle dorsiflexor and plantar flexor ? ? ? ?Assessment/Plan: ?1. Functional deficits which require 3+ hours per day of interdisciplinary therapy in a comprehensive inpatient rehab setting. ?Physiatrist is providing close team supervision and 24 hour management of active medical problems listed below. ?Physiatrist and rehab team continue to assess barriers to discharge/monitor patient progress toward functional and medical goals ? ?Care Tool: ? ?Bathing ?   ?Body parts bathed by patient:  Right arm, Left arm, Chest, Abdomen, Front perineal area, Right upper leg, Left upper leg, Face  ? Body parts bathed by helper: Buttocks, Right lower leg, Left lower leg ?  ?  ?Bathing assist Assist Level: Maximal Assistance - Patient 24 - 49% ?  ?  ?Upper Body Dressing/Undressing ?Upper body dressing   ?What is the patient wearing?: Pull over shirt ?   ?Upper body assist Assist Level: Moderate Assistance - Patient 50 - 74% ?   ?Lower Body Dressing/Undressing ?Lower body dressing ? ? ?   ?What is the patient wearing?: Underwear/pull up ? ?  ? ?Lower body assist Assist for lower body dressing: 2 Helpers ?   ? ?Toileting ?Toileting Toileting Activity did not occur (Probation officer and hygiene only): N/A (no void or bm)  ?Toileting assist Assist for toileting: Total Assistance - Patient < 25% ?  ?  ?Transfers ?Chair/bed transfer ? ?Transfers assist ?   ? ?Chair/bed transfer assist level: Dependent - mechanical lift ?  ?  ?Locomotion ?Ambulation ? ? ?Ambulation assist ? ? Ambulation activity did not occur: Safety/medical concerns ? ?  ?  ?   ? ?Walk 10 feet activity ? ? ?  Assist ? Walk 10 feet activity did not occur: Safety/medical concerns ? ?  ?   ? ?Walk 50 feet activity ? ? ?Assist Walk 50 feet with 2 turns activity did not occur: Safety/medical concerns ? ?  ?   ? ? ?Walk 150 feet activity ? ? ?Assist Walk 150 feet activity did not occur: Safety/medical concerns ? ?  ?  ?  ? ?Walk 10 feet on uneven surface  ?activity ? ? ?Assist Walk 10 feet on uneven surfaces activity did not occur: Safety/medical concerns ? ? ?  ?   ? ?Wheelchair ? ? ? ? ?Assist Is the patient using a wheelchair?: Yes ?Type of Wheelchair: Manual ?  ? ?Wheelchair assist level: Supervision/Verbal cueing ?Max wheelchair distance: 150'  ? ? ?Wheelchair 50 feet with 2 turns activity ? ? ? ?Assist ? ?  ?  ? ? ?Assist Level: Supervision/Verbal cueing  ? ?Wheelchair 150 feet activity  ? ? ? ?Assist ?   ? ? ?Assist Level: Supervision/Verbal cueing   ? ?Blood pressure 111/63, pulse 82, temperature 98.2 ?F (36.8 ?C), resp. rate 16, height 5\' 6"  (1.676 m), weight 126 kg, SpO2 98 %. ? ?Medical Problem List and Plan: ?1. 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 ?- Decadron taper ?            -patient may shower ?            -ELOS/Goals: 10-12 days, supervision goals with PT and OT ?  [facility] says Surgical intervention is possible- will likely need to check with family? ? 2/28- surgeon seeing pt/son this AM- con't CIR- PT and OT ? 3/1- son still plans to take pt home- d/c 3/14 ? Con't CIR_ PT and OT- declines surgery ?2.  Antithrombotics: ?-DVT/anticoagulation:  Pharmaceutical: Heparin.   ?            -antiplatelet therapy: Aspirin 325 mg daily ?3. Pain Management: Oxycodone as needed ?Knee pain 2019 xray show severe OA on RIght and Moderate on Left  OA will add voltaren gel  ? Changed Oxy to q4 hours prn ? 3/1- having tailbone pain this AM for sitting in place for a long time- con't regimen ?4. Mood: Trazodone 150 mg nightly, Xanax 0.25 mg twice daily as needed ?2/28- d/w pt again that has to ask for meds- also let son know  ?            -antipsychotic agents: N/A ?5. Neuropsych: This patient is capable of making decisions on her own behalf. ?6. Skin/Wound Care: Routine skin checks ?            -encourage appropriate nutrition  ?            -pressure relief ?7. Fluids/Electrolytes/Nutrition:  .   ?            -add protein supp for low albumin ?8.  Diabetes mellitus.  Hemoglobin A1c 6.0.    ?            CBG (last 3)  ?     Controlled on 2/25 ? 2/28- CBGs controlled- con't reigmen ?9.  Hypertension.  Lopressor 25 mg twice daily, hydralazine 25 mg every 8 hours.     ? Off all BP meds ? Soft, but asymptomatic on 2/25 ? 3/1- per therapy, no drops in BP during therapy- con't regimen ?Vitals:  ? 12/12/21 2005 12/13/21 0357  ?BP: 113/69 111/63  ?Pulse: 76 82  ?Resp: 16 16  ?Temp: 98.7 ?F (37.1 ?C)  98.2 ?F (36.8 ?C)  ?SpO2: 99% 98%  ?  ?10.  Hyperlipidemia.   Zocor ?11.  Microcytic anemia.  Continue iron supplement.  Follow-up CBC ?12.  Acute urinary retention/neurogenic bladder.  Foley tube removed.   ?-Presently on Flomax 0.4 mg.   ?-need pvr's ? 2/21- last cathed 2/19 early AM- but bladder scans <150 cc since then- off PVRs right now ?13.  Constipation   ?-Senokot twice daily, MiraLAX daily ?Changed Senna to 2 tabs BID ?She notes improvement in bowel movements and states she did not take the laxatives yesterday ?2/27- reduced laxatives to 3 pills/day. Since was having loose stools- 3 Bms yesterdays.  ?2/28- Reduced senna to 1 tab/day since on pain meds- but should reduce such frequent BM's.  ?3/1- still having BM's frequently- con't regimen for now- will wait to drop meds again.  ?14.  Obesity.  BMI 39.5.  Dietary follow-up ?15.  History of CVA.  Modified independent with straight point cane prior to admission.  Continue aspirin ?16.  Leukocytosis.  Suspect steroid-induced--improved to 9.6 2/17 ? 2/20- WBC down to 7.8k- con't regimen ? 2/28- WBC down to 3.3- will monitor ?17. AKI ? 2/20- Cr up to 1.56 and BUN up to 31- will recheck Wednesday and push fluids- hopefully will help BP and Cr.  ? 2/24- BUN still 29 and Cr 1.37- will give IVFs since pushing lfuids isn't working- and recheck in AM- IVFs NS 75 cc/hour x 24 hours ? Creatinine 1.29 on 2/25, improving ? 2/27- Cr/labs pending this AM- will monitor ? 2/28- Cr 1.25- BUN 19- not dry anymore- con't to monitor ?18. Abrasion L buttock ? 2/22- doesn' thave pressure ulcer- per my exam nor WOC- will con't wound care.  ?19.  Hyponatremia ? Sodium 133 on 2/25-stable ? 2/28- Na 135- ? ? ? ? ?LOS: ?13 days ?A FACE TO FACE EVALUATION WAS PERFORMED ? ?Mccauley Diehl ?12/13/2021, 8:04 AM  ? ? ? ?

## 2021-12-14 DIAGNOSIS — G959 Disease of spinal cord, unspecified: Secondary | ICD-10-CM | POA: Diagnosis not present

## 2021-12-14 LAB — GLUCOSE, CAPILLARY
Glucose-Capillary: 104 mg/dL — ABNORMAL HIGH (ref 70–99)
Glucose-Capillary: 110 mg/dL — ABNORMAL HIGH (ref 70–99)
Glucose-Capillary: 88 mg/dL (ref 70–99)
Glucose-Capillary: 128 mg/dL — ABNORMAL HIGH (ref 70–99)

## 2021-12-14 NOTE — Progress Notes (Signed)
Occupational Therapy Session Note ? ?Patient Details  ?Name: Brandi [name] ?MRN: [id number] ?Date of Birth: [birth year] ? ?Today's Date: 12/14/2021 ?OT Individual Time: 2197-5883 ?OT Individual Time Calculation (min): 75 min  ? ? ?Short Term Goals: ?Week 2:  OT Short Term Goal 1 (Week 2): Pt will don UB clothing with CGA ?OT Short Term Goal 1 - Progress (Week 2): Met ?OT Short Term Goal 2 (Week 2): Pt will complete sit > stand with max A of 1 using LRAD ?OT Short Term Goal 2 - Progress (Week 2): Discontinued (comment) ?OT Short Term Goal 3 (Week 2): Pt will complete bed mobility to EOB with CGA ?OT Short Term Goal 3 - Progress (Week 2): Progressing toward goal ?OT Short Term Goal 4 (Week 2): Pt will complete SB DABSC transfer with mod A+1 ?OT Short Term Goal 5 (Week 2): Pt will complete LB dressing tasks with max A at bed level ? ?Skilled Therapeutic Interventions/Progress Updates:  ?  Pt resting in bed upon arrival. Pt stated she needed to urinate and wanted to use bedpan. Min A for rolling in bed using bed rails and dependent for placement of bedpan. Tot A for toileting at bed level. Tot A for donning pants at bed level. Supine>sit EOB with min A for managing RLE. SB tranfser to w/c with CGA. Pt completed UB bathing/dressing tasks w/c level at sink. Pt propelled w/c to gym for BUE activities tapping therapy ball and BUE therex with 5# bar-overhead presses 3x12. Pt propelled w/c back to room and remained in w/c with belt alarm activated. All needs within reach.  ? ?Therapy Documentation ?Precautions:  ?Precautions ?Precautions: Fall ?Restrictions ?Weight Bearing Restrictions: No ? ?Pain: ? Pt denies pain this morning ? ? ? ?Therapy/Group: Individual Therapy ? ?[name] ?12/14/2021, 9:36 AM ?

## 2021-12-14 NOTE — Progress Notes (Signed)
Physical Therapy Session Note ? ?Patient Details  ?Name: Brandi [name] ?MRN: [id number] ?Date of Birth: [birth year] ? ?Today's Date: 12/14/2021 ?PT Individual Time: 1055-1200 ?PT Individual Time Calculation (min): 65 min  ? ?Short Term Goals: ?Week 2:  PT Short Term Goal 1 (Week 2): Pt will perform sit to stand with max A and LRAD ?PT Short Term Goal 2 (Week 2): Pt will perform least restrictive transfer with assist x 1 ?PT Short Term Goal 3 (Week 2): Pt will perform bed mobility with mod A consistently ? ?Skilled Therapeutic Interventions/Progress Updates: Pt presented in w/c agreeable to therapy. Pt c/o pain in L knee and buttock, nsg notified and p received pain meds during session. Pt was able to propel to ortho gym with supervision and increased time. Performed Slide board transfer to R with minA with PTA providing total A for Slide board set up. While sitting on mat, pt participated in ball catches for sitting balance x 15, kicking ball x 15 bilaterally with pt able to generate more power R>L,  and ball taps with 3lb dowel x 15. Pt then set up with standing frame in front of mat and participated in standing x 3, 6, and 3 min respectively. While in standing pt attempted to perform TKE to which she was successful with  LLE but required assist with RLE. Pt also participated in reaching for ball using BUE simultaneously during 2/3 standing bouts. Pt then transferred back to w/c via Slide board and MinA nearing CGA with verbal cues. Pt transported back to room at end of session and left with belt alarm on, call bell within reach and needs met.  ?   ? ?Therapy Documentation ?Precautions:  ?Precautions ?Precautions: Fall ?Restrictions ?Weight Bearing Restrictions: No ?General: ?  ?Vital Signs: ?Therapy Vitals ?Temp: 97.9 ?F (36.6 ?C) ?Temp Source: Oral ?Pulse Rate: 98 ?Resp: 18 ?BP: 129/78 ?Patient Position (if appropriate): Sitting ?Oxygen Therapy ?SpO2: 98 % ?O2 Device: Room Air ?Pain: ?Pain Assessment ?Pain Score:  2  ?Mobility: ?  ?Locomotion : ?   ?Trunk/Postural Assessment : ?   ?Balance: ?  ?Exercises: ?  ?Other Treatments:   ? ? ? ?Therapy/Group: Individual Therapy ? ?Merit Gadsby ?12/14/2021, 4:19 PM  ?

## 2021-12-14 NOTE — Progress Notes (Signed)
Occupational Therapy Session Note ? ?Patient Details  ?Name: ADRIE PICKING ?MRN: [id number] ?Date of Birth: Jul 25, 1956 ? ?Today's Date: 12/14/2021 ?OT Individual Time: 1345-1430 ?OT Individual Time Calculation (min): 45 min  ? ? ?Short Term Goals: ?Week 2:  OT Short Term Goal 1 (Week 2): Pt will don UB clothing with CGA ?OT Short Term Goal 1 - Progress (Week 2): Met ?OT Short Term Goal 2 (Week 2): Pt will complete sit > stand with max A of 1 using LRAD ?OT Short Term Goal 2 - Progress (Week 2): Discontinued (comment) ?OT Short Term Goal 3 (Week 2): Pt will complete bed mobility to EOB with CGA ?OT Short Term Goal 3 - Progress (Week 2): Progressing toward goal ?OT Short Term Goal 4 (Week 2): Pt will complete SB DABSC transfer with mod A+1 ?OT Short Term Goal 5 (Week 2): Pt will complete LB dressing tasks with max A at bed level ? ?Skilled Therapeutic Interventions/Progress Updates:  ?  OT intervention with focus on sit<>stand in [place] and from [place] paddles, and bed mobility. Sit<>stand X 3 from w/c and X 4 from paddles. Sit<>stand from w/c with mod A. Sit<>stand from paddles with min A. Pt requested to return to bed to use bed pan. Min A for bed mobility and tot A for toileting tasks. Pt remained in bed with all needs within reach. Bed alarm activated.  ? ?Therapy Documentation ?Precautions:  ?Precautions ?Precautions: Fall ?Restrictions ?Weight Bearing Restrictions: No ?Pain: ?Pt denies pain this afternoon ? ? ?Therapy/Group: Individual Therapy ? ?[name] ?12/14/2021, 3:13 PM ?

## 2021-12-14 NOTE — Progress Notes (Signed)
?                                                       PROGRESS NOTE ? ? ?Subjective/Complaints: ? ? ?BM's doing better- have lessened in urgency and frequency ?Doing pretty well overall.  ? ?ROS-  ? ?Pt denies SOB, abd pain, CP, N/V/C/D, and vision changes ? ? ? ?Objective: ?  ?No results found. ?No results for input(s): WBC, HGB, HCT, PLT in the last 72 hours. ? ? ?No results for input(s): NA, K, CL, CO2, GLUCOSE, BUN, CREATININE, CALCIUM in the last 72 hours. ? ? ?Intake/Output Summary (Last 24 hours) at 12/14/2021 0758 ?Last data filed at 12/14/2021 5102 ?Gross per 24 hour  ?Intake 560 ml  ?Output 650 ml  ?Net -90 ml  ?  ? ? ? ?Physical Exam: ?Vital Signs ?Blood pressure (!) 101/57, pulse 79, temperature 98.4 ?F (36.9 ?C), resp. rate 18, height 5\' 6"  (1.676 m), weight 126 kg, SpO2 100 %. ? ? ? ? ? ? ?General: awake, alert, appropriate, sitting up in bed eating breakfast; NAD ?HENT: conjugate gaze; oropharynx moist ?CV: regular rate; no JVD ?Pulmonary: CTA B/L; no W/R/R- good air movement ?GI: soft, NT, ND, (+)BS- normoactive ?Psychiatric: appropriate ?Neurological: fewer word finding issues this AM; alert ?Extremities: No clubbing, cyanosis, or edema ?Neurologic: Alert ?motor strength is /5 in bilateral deltoid, bicep, tricep, grip, 3- bilateral hip flexor, knee extensors, ankle dorsiflexor and plantar flexor ? ? ? ?Assessment/Plan: ?1. Functional deficits which require 3+ hours per day of interdisciplinary therapy in a comprehensive inpatient rehab setting. ?Physiatrist is providing close team supervision and 24 hour management of active medical problems listed below. ?Physiatrist and rehab team continue to assess barriers to discharge/monitor patient progress toward functional and medical goals ? ?Care Tool: ? ?Bathing ?   ?Body parts bathed by patient: Right arm, Left arm, Chest, Abdomen, Front perineal area, Right upper leg, Left upper leg, Face  ? Body parts bathed by helper: Buttocks, Right lower leg, Left  lower leg ?  ?  ?Bathing assist Assist Level: Moderate Assistance - Patient 50 - 74% ?  ?  ?Upper Body Dressing/Undressing ?Upper body dressing   ?What is the patient wearing?: Pull over shirt, Bra ?   ?Upper body assist Assist Level: Minimal Assistance - Patient > 75% ?   ?Lower Body Dressing/Undressing ?Lower body dressing ? ? ?   ?What is the patient wearing?: Incontinence brief, Pants ? ?  ? ?Lower body assist Assist for lower body dressing: Total Assistance - Patient < 25% ?   ? ?Toileting ?Toileting Toileting Activity did not occur (Probation officer and hygiene only): N/A (no void or bm)  ?Toileting assist Assist for toileting: Total Assistance - Patient < 25% ?  ?  ?Transfers ?Chair/bed transfer ? ?Transfers assist ?   ? ?Chair/bed transfer assist level: Dependent - mechanical lift ?  ?  ?Locomotion ?Ambulation ? ? ?Ambulation assist ? ? Ambulation activity did not occur: Safety/medical concerns ? ?  ?  ?   ? ?Walk 10 feet activity ? ? ?Assist ? Walk 10 feet activity did not occur: Safety/medical concerns ? ?  ?   ? ?Walk 50 feet activity ? ? ?Assist Walk 50 feet with 2 turns activity did not occur: Safety/medical concerns ? ?  ?   ? ? ?  Walk 150 feet activity ? ? ?Assist Walk 150 feet activity did not occur: Safety/medical concerns ? ?  ?  ?  ? ?Walk 10 feet on uneven surface  ?activity ? ? ?Assist Walk 10 feet on uneven surfaces activity did not occur: Safety/medical concerns ? ? ?  ?   ? ?Wheelchair ? ? ? ? ?Assist Is the patient using a wheelchair?: Yes ?Type of Wheelchair: Manual ?  ? ?Wheelchair assist level: Supervision/Verbal cueing ?Max wheelchair distance: 150'  ? ? ?Wheelchair 50 feet with 2 turns activity ? ? ? ?Assist ? ?  ?  ? ? ?Assist Level: Supervision/Verbal cueing  ? ?Wheelchair 150 feet activity  ? ? ? ?Assist ?   ? ? ?Assist Level: Supervision/Verbal cueing  ? ?Blood pressure (!) 101/57, pulse 79, temperature 98.4 ?F (36.9 ?C), resp. rate 18, height 5\' 6"  (1.676 m), weight 126 kg, SpO2  100 %. ? ?Medical Problem List and Plan: ?1. 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 ?- Decadron taper ?            -patient may shower ?            -ELOS/Goals: 10-12 days, supervision goals with PT and OT ?  [facility] says Surgical intervention is possible- will likely need to check with family? ? 2/28- surgeon seeing pt/son this AM- con't CIR- PT and OT ? 3/1- son still plans to take pt home- d/c 3/14 ? Con't CIR- PT and OT- declines surgery ?2.  Antithrombotics: ?-DVT/anticoagulation:  Pharmaceutical: Heparin.   ?            -antiplatelet therapy: Aspirin 325 mg daily ?3. Pain Management: Oxycodone as needed ?Knee pain 2019 xray show severe OA on RIght and Moderate on Left  OA will add voltaren gel  ? Changed Oxy to q4 hours prn ? 3/1- having tailbone pain this AM for sitting in place for a long time- con't regimen ? 3/2- pain usually controlled- cont' regimen ?4. Mood: Trazodone 150 mg nightly, Xanax 0.25 mg twice daily as needed ?2/28- d/w pt again that has to ask for meds- also let son know  ?            -antipsychotic agents: N/A ?5. Neuropsych: This patient is capable of making decisions on her own behalf. ?6. Skin/Wound Care: Routine skin checks ?            -encourage appropriate nutrition  ?            -pressure relief ?7. Fluids/Electrolytes/Nutrition:  .   ?            -add protein supp for low albumin ?8.  Diabetes mellitus.  Hemoglobin A1c 6.0.    ?            CBG (last 3)  ?     Controlled on 2/25 ? 2/28- CBGs controlled- con't regimen ? 3/2- 93-119-con't regimen ?9.  Hypertension.  Lopressor 25 mg twice daily, hydralazine 25 mg every 8 hours.     ? Off all BP meds ? 3/2- BP soft, but no drops in therapy- con't off meds- con't to monitor ?Vitals:  ? 12/13/21 1446 12/13/21 2042  ?BP: 112/77 (!) 101/57  ?Pulse: 93 79  ?Resp: 18 18  ?Temp: 98.2 ?F (36.8 ?C) 98.4 ?F (36.9 ?C)  ?SpO2: 99% 100%  ?  ?10.  Hyperlipidemia.  Zocor ?11.  Microcytic anemia.  Continue iron supplement.  Follow-up CBC ?12.   Acute urinary retention/neurogenic bladder.  Foley tube removed.   ?-Presently on Flomax 0.4 mg.   ?-need pvr's ? 2/21- last cathed 2/19 early AM- but bladder scans <150 cc since then- off PVRs right now ?13.  Constipation   ?-Senokot twice daily, MiraLAX daily ?Changed Senna to 2 tabs BID ?She notes improvement in bowel movements and states she did not take the laxatives yesterday ?2/27- reduced laxatives to 3 pills/day. Since was having loose stools- 3 Bms yesterdays.  ?2/28- Reduced senna to 1 tab/day since on pain meds- but should reduce such frequent BM's.  ?3/1- still having BM's frequently- con't regimen for now- will wait to drop meds again. ?3/2- have decreased frequency and urgency- con't regimen  ?14.  Obesity.  BMI 39.5.  Dietary follow-up ?15.  History of CVA.  Modified independent with straight point cane prior to admission.  Continue aspirin ?16.  Leukocytosis.  Suspect steroid-induced--improved to 9.6 2/17 ? 2/20- WBC down to 7.8k- con't regimen ? 2/28- WBC down to 3.3- will monitor ?17. AKI ? 2/20- Cr up to 1.56 and BUN up to 31- will recheck Wednesday and push fluids- hopefully will help BP and Cr.  ? 2/24- BUN still 29 and Cr 1.37- will give IVFs since pushing lfuids isn't working- and recheck in AM- IVFs NS 75 cc/hour x 24 hours ? Creatinine 1.29 on 2/25, improving ? 2/27- Cr/labs pending this AM- will monitor ? 2/28- Cr 1.25- BUN 19- not dry anymore- con't to monitor ?18. Abrasion L buttock ? 2/22- doesn' thave pressure ulcer- per my exam nor WOC- will con't wound care. ?3/2- will double check with nursing, but WOC said was abrasion.   ?19.  Hyponatremia ? Sodium 133 on 2/25-stable ? 2/28- Na 135- ? ? ? ? ?LOS: ?14 days ?A FACE TO FACE EVALUATION WAS PERFORMED ? ?Brandi [name] ?12/14/2021, 7:58 AM  ? ? ? ?

## 2021-12-14 NOTE — Evaluation (Signed)
Recreational Therapy Assessment and Plan ? ?Patient Details  ?Name: Brandi [name] ?MRN: [id number] ?Date of Birth: 1956-07-17 ?Today's Date: 12/14/2021 ? ?Rehab Potential:  Good ?ELOS:   d/c 3/14 ? ?Assessment ?Hospital Problem: Principal Problem: ?  Myelopathy ([place]) ?  ?  ?Past Medical History:  ?    ?Past Medical History:  ?Diagnosis Date  ? Anemia    ? Diabetes mellitus without complication ([place])    ? Hypertension    ? Stroke [facility]) 2013  ?  ?Past Surgical History:  ?     ?Past Surgical History:  ?Procedure Laterality Date  ? BOWEL RESECTION N/A 02/01/2017  ?  Procedure: PARTIAL SMALL BOWEL RESECTION;  Surgeon: Aviva Signs, MD;  Location: [facility] ORS;  Service: General;  Laterality: N/A;  ? COLONOSCOPY WITH PROPOFOL N/A 01/26/2017  ?  Procedure: COLONOSCOPY WITH PROPOFOL;  Surgeon: Daneil Dolin, MD;  Location: [facility] END[facility] SUITE;  Service: Endoscopy;  Laterality: N/A;  ? cyst removed from right breast      ? ESOPHAGOGASTRODUODENOSCOPY (EGD) WITH PROPOFOL N/A 01/26/2017  ?  Procedure: ESOPHAGOGASTRODUODENOSCOPY (EGD) WITH PROPOFOL;  Surgeon: Daneil Dolin, MD;  Location: [facility];  Service: Endoscopy;  Laterality: N/A;  ? GIVENS CAPSULE STUDY N/A 01/27/2017  ?  Procedure: GIVENS CAPSULE STUDY;  Surgeon: Daneil Dolin, MD;  Location: [facility];  Service: Endoscopy;  Laterality: N/A;  ? KNEE SURGERY   right knee  ? LAPAROTOMY N/A 02/01/2017  ?  Procedure: EXPLORATORY LAPAROTOMY;  Surgeon: Aviva Signs, MD;  Location: [facility];  Service: General;  Laterality: N/A;  ? POLYPECTOMY   01/26/2017  ?  Procedure: POLYPECTOMY;  Surgeon: Daneil Dolin, MD;  Location: [facility];  Service: Endoscopy;;  sigmoid and rectal  ?  ?  ?Assessment & Plan ?Clinical Impression: Patient is a 66 year old right-handed female with history of parasitic anemia, diabetes mellitus hypertension, history of CVA maintained on aspirin, hyperlipidemia, quit smoking 10 years ago.  Per chart review patient lives with children.  1 level home 10 steps to  entry.  Independent with straight point cane.  Presented 11/20/2021 with lower extremity weakness progressive paraplegia to [facility] as well as tingling in left foot x2 weeks as well as gait instability.Marland Kitchen  CT/MRI of the brain chronic left temporoparietal encephalomalacia no evidence of acute abnormality.  MRA of the head negative. 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  MRI thoracic spine showed T3-4, T4-5 disc bulge with osteophytic ridging with moderate to severe canal stenosis.  Neurosurgical /neurology follow-up no surgical intervention placed on steroid taper.  Scans reviewed by neurosurgery for etiology of her weakness and did not believe it was from her cervical disease she had no upper extremity involvement.  Suspect myelopathy with upper thoracic sensory level   It was felt that transverse myelitis relatively unlikely.  Awaiting plan for possible myelogram.  Patient was cleared to begin subcutaneous heparin for DVT prophylaxis.  Bouts of acute urinary retention Foley tube removed currently maintained on Flomax.  Chronic anemia hemoglobin 9.5 and monitored.  Therapy evaluations completed due to patient decreased functional mobility was admitted for a comprehensive rehab program.  Patient transferred to CIR on 11/30/2021 .  ?  ?Met with pt today to discuss TR services including leisure education, activity analysis/modifications and stress management.  Also discussed the importance of social, emotional, spiritual health in addition to physical health and their effects on overall health and wellness.  Pt stated understanding. ? ?Pt presents with decreased activity tolerance, decreased functional mobility, decreased balance, decreased coordination Limiting pt's independence with leisure/community pursuits. ? ?Plan ? Min 1 TR session >20 minutes per week during LOS ? ?Recommendations for other services: None  ? ?Discharge Criteria: Patient  will be discharged from TR if patient refuses treatment 3 consecutive times without medical reason.  If treatment goals not met, if there is a change in medical status, if patient makes no progress towards goals or if patient is discharged from hospital. ? ?The above assessment, treatment plan, treatment alternatives and goals were discussed and mutually agreed upon: by patient ? ?Brandi [name] ?12/14/2021, 2:59 PM  ?

## 2021-12-15 DIAGNOSIS — G959 Disease of spinal cord, unspecified: Secondary | ICD-10-CM | POA: Diagnosis not present

## 2021-12-15 LAB — GLUCOSE, CAPILLARY
Glucose-Capillary: 106 mg/dL — ABNORMAL HIGH (ref 70–99)
Glucose-Capillary: 115 mg/dL — ABNORMAL HIGH (ref 70–99)
Glucose-Capillary: 87 mg/dL (ref 70–99)
Glucose-Capillary: 116 mg/dL — ABNORMAL HIGH (ref 70–99)

## 2021-12-15 MED ORDER — SORBITOL 70 % SOLN
30.0000 mL | Freq: Every day | Status: DC | PRN
Start: 2021-12-15 — End: 2021-12-26
  Administered 2021-12-16: 30 mL via ORAL
  Filled 2021-12-15: qty 30

## 2021-12-15 MED ORDER — SENNA 8.6 MG PO TABS
2.0000 | ORAL_TABLET | Freq: Every day | ORAL | Status: DC
  Administered 2021-12-15 – 2021-12-24 (×10): 17.2 mg via ORAL
  Filled 2021-12-15 (×11): qty 2

## 2021-12-15 NOTE — Progress Notes (Signed)
Occupational Therapy Session Note ? ?Patient Details  ?Name: Brandi [name] ?MRN: [id number] ?Date of Birth: 07-31-1956 ? ?Today's Date: 12/15/2021 ?OT Individual Time: 7680-8811 ?OT Individual Time Calculation (min): 71 min  ? ? ?Short Term Goals: ?Week 2:  OT Short Term Goal 1 (Week 2): Pt will don UB clothing with CGA ?OT Short Term Goal 1 - Progress (Week 2): Met ?OT Short Term Goal 2 (Week 2): Pt will complete sit > stand with max A of 1 using LRAD ?OT Short Term Goal 2 - Progress (Week 2): Discontinued (comment) ?OT Short Term Goal 3 (Week 2): Pt will complete bed mobility to EOB with CGA ?OT Short Term Goal 3 - Progress (Week 2): Progressing toward goal ?OT Short Term Goal 4 (Week 2): Pt will complete SB DABSC transfer with mod A+1 ?OT Short Term Goal 5 (Week 2): Pt will complete LB dressing tasks with max A at bed level ? ?Skilled Therapeutic Interventions/Progress Updates:  ?  Pt resting in bed upon arrival. LB dressing at bed level with max A. Rolling R/L min A using bed rails. Supine>sit EOB wth min A to manage RLE. Sidelying>sitting EOB with supervision and HOB elevated 10*. SB tranfser to w/c with min A/CGA. UB bathing/dressing w/c level at sink with supervision except fasteneing bra. Pt engaged in BUE therex with 1kg ball-chest presses and diagonals 3x10. Pt tapped medium size therapy ball with 5# bar 3x12. Pt propelled w/c back to room with supervision. Pt remained in w/c with belt alarm activated. All needs within reach.  ? ?Therapy Documentation ?Precautions:  ?Precautions ?Precautions: Fall ?Restrictions ?Weight Bearing Restrictions: No ?  ?Pain: ? Pt c/o Bil knee pain; meds admin prior to therapy ? ? ?Therapy/Group: Individual Therapy ? ?[name] ?12/15/2021, 9:33 AM ?

## 2021-12-15 NOTE — Progress Notes (Signed)
?                                                       PROGRESS NOTE ? ? ?Subjective/Complaints: ? ?LBM 2 days ago- starting to feel constipated- needs me to increase bowel meds.  ?Peeing OK.  ?Strength a little better every few days.   ? ?ROS-  ? ?Pt denies SOB, abd pain, CP, N/V/C/D, and vision changes ? ? ?Objective: ?  ?No results found. ?No results for input(s): WBC, HGB, HCT, PLT in the last 72 hours. ? ? ?No results for input(s): NA, K, CL, CO2, GLUCOSE, BUN, CREATININE, CALCIUM in the last 72 hours. ? ? ?Intake/Output Summary (Last 24 hours) at 12/15/2021 0744 ?Last data filed at 12/15/2021 0433 ?Gross per 24 hour  ?Intake 600 ml  ?Output 550 ml  ?Net 50 ml  ?  ? ? ? ?Physical Exam: ?Vital Signs ?Blood pressure 117/70, pulse 84, temperature 98 ?F (36.7 ?C), resp. rate 18, height 5\' 6"  (1.676 m), weight 121.5 kg, SpO2 98 %. ? ? ? ? ? ? ? ?General: awake, alert, appropriate, sitting up in bed; finished breakfast; NAD ?HENT: conjugate gaze; oropharynx moist ?CV: regular rate; no JVD ?Pulmonary: CTA B/L; no W/R/R- good air movement ?GI: soft, NT, ND, (+)BS ?Psychiatric: appropriate ?Neurological: alert- word finding issues stable ?Extremities: No clubbing, cyanosis, or edema ?Neurologic: Alert ?motor strength is /5 in bilateral deltoid, bicep, tricep, grip, 3- bilateral hip flexor, knee extensors, ankle dorsiflexor and plantar flexor ? ? ? ?Assessment/Plan: ?1. Functional deficits which require 3+ hours per day of interdisciplinary therapy in a comprehensive inpatient rehab setting. ?Physiatrist is providing close team supervision and 24 hour management of active medical problems listed below. ?Physiatrist and rehab team continue to assess barriers to discharge/monitor patient progress toward functional and medical goals ? ?Care Tool: ? ?Bathing ?   ?Body parts bathed by patient: Right arm, Left arm, Chest, Abdomen, Front perineal area, Right upper leg, Left upper leg, Face  ? Body parts bathed by helper:  Buttocks, Right lower leg, Left lower leg ?  ?  ?Bathing assist Assist Level: Moderate Assistance - Patient 50 - 74% ?  ?  ?Upper Body Dressing/Undressing ?Upper body dressing   ?What is the patient wearing?: Pull over shirt, Bra ?   ?Upper body assist Assist Level: Minimal Assistance - Patient > 75% ?   ?Lower Body Dressing/Undressing ?Lower body dressing ? ? ?   ?What is the patient wearing?: Incontinence brief, Pants ? ?  ? ?Lower body assist Assist for lower body dressing: Total Assistance - Patient < 25% ?   ? ?Toileting ?Toileting Toileting Activity did not occur (Probation officer and hygiene only): N/A (no void or bm)  ?Toileting assist Assist for toileting: Total Assistance - Patient < 25% ?  ?  ?Transfers ?Chair/bed transfer ? ?Transfers assist ?   ? ?Chair/bed transfer assist level: Minimal Assistance - Patient > 75% ?Chair/bed transfer assistive device: Sliding board ?  ?Locomotion ?Ambulation ? ? ?Ambulation assist ? ? Ambulation activity did not occur: Safety/medical concerns ? ?  ?  ?   ? ?Walk 10 feet activity ? ? ?Assist ? Walk 10 feet activity did not occur: Safety/medical concerns ? ?  ?   ? ?Walk 50 feet activity ? ? ?Assist Walk 50 feet with 2  turns activity did not occur: Safety/medical concerns ? ?  ?   ? ? ?Walk 150 feet activity ? ? ?Assist Walk 150 feet activity did not occur: Safety/medical concerns ? ?  ?  ?  ? ?Walk 10 feet on uneven surface  ?activity ? ? ?Assist Walk 10 feet on uneven surfaces activity did not occur: Safety/medical concerns ? ? ?  ?   ? ?Wheelchair ? ? ? ? ?Assist Is the patient using a wheelchair?: Yes ?Type of Wheelchair: Manual ?  ? ?Wheelchair assist level: Supervision/Verbal cueing ?Max wheelchair distance: 150'  ? ? ?Wheelchair 50 feet with 2 turns activity ? ? ? ?Assist ? ?  ?  ? ? ?Assist Level: Supervision/Verbal cueing  ? ?Wheelchair 150 feet activity  ? ? ? ?Assist ?   ? ? ?Assist Level: Supervision/Verbal cueing  ? ?Blood pressure 117/70, pulse 84,  temperature 98 ?F (36.7 ?C), resp. rate 18, height 5\' 6"  (1.676 m), weight 121.5 kg, SpO2 98 %. ? ?Medical Problem List and Plan: ?1. 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 ?- Decadron taper ?            -patient may shower ?            -ELOS/Goals: 10-12 days, supervision goals with PT and OT ?  [facility] says Surgical intervention is possible- will likely need to check with family? ? 2/28- surgeon seeing pt/son this AM- con't CIR- PT and OT ? 3/1- son still plans to take pt home- d/c 3/14 ? Con't CIR_ PT and OT- pt declines surgery for LE weakness ?2.  Antithrombotics: ?-DVT/anticoagulation:  Pharmaceutical: Heparin.   ?            -antiplatelet therapy: Aspirin 325 mg daily ?3. Pain Management: Oxycodone as needed ?Knee pain 2019 xray show severe OA on RIght and Moderate on Left  OA will add voltaren gel  ? Changed Oxy to q4 hours prn ? 3/1- having tailbone pain this AM for sitting in place for a long time- con't regimen ? 3/3- pain is an issue, but taking prn oxy- con't regimen ?4. Mood: Trazodone 150 mg nightly, Xanax 0.25 mg twice daily as needed ?2/28- d/w pt again that has to ask for meds- also let son know  ?            -antipsychotic agents: N/A ?5. Neuropsych: This patient is capable of making decisions on her own behalf. ?6. Skin/Wound Care: Routine skin checks ?            -encourage appropriate nutrition  ?            -pressure relief ?7. Fluids/Electrolytes/Nutrition:  .   ?            -add protein supp for low albumin ?8.  Diabetes mellitus.  Hemoglobin A1c 6.0.    ?            CBG (last 3)  ?     Controlled on 2/25 ? 3/3- CBGs look great- con't regimen ?9.  Hypertension.  Lopressor 25 mg twice daily, hydralazine 25 mg every 8 hours.     ? Off all BP meds ? 3/3- BP looking a littl ebetter- less soft- con't regimen off meds ?Vitals:  ? 12/14/21 1928 12/15/21 0414  ?BP: 118/74 117/70  ?Pulse: 98 84  ?Resp: 18 18  ?Temp: 98 ?F (36.7 ?C) 98 ?F (36.7 ?C)  ?SpO2: 99% 98%  ?  ?10.  Hyperlipidemia.   Zocor ?11.  Microcytic anemia.  Continue iron supplement.  Follow-up CBC ?12.  Acute urinary retention/neurogenic bladder.  Foley tube removed.   ?-Presently on Flomax 0.4 mg.   ?-need pvr's ? 2/21- last cathed 2/19 early AM- but bladder scans <150 cc since then- off PVRs right now ?13.  Constipation   ?-Senokot twice daily, MiraLAX daily ?Changed Senna to 2 tabs BID ?She notes improvement in bowel movements and states she did not take the laxatives yesterday ?2/27- reduced laxatives to 3 pills/day. Since was having loose stools- 3 Bms yesterdays.  ?2/28- Reduced senna to 1 tab/day since on pain meds- but should reduce such frequent BM's.  ?3/1- still having BM's frequently- con't regimen for now- will wait to drop meds again. ?3/2- have decreased frequency and urgency- con't regimen  ?3/3- will increase Senna to 2 tabs daily and add sorbitol prn if needs it.  ?14.  Obesity.  BMI 39.5.  Dietary follow-up ?15.  History of CVA.  Modified independent with straight point cane prior to admission.  Continue aspirin ?16.  Leukocytosis.  Suspect steroid-induced--improved to 9.6 2/17 ? 2/20- WBC down to 7.8k- con't regimen ? 2/28- WBC down to 3.3- will monitor ?17. AKI ? 2/20- Cr up to 1.56 and BUN up to 31- will recheck Wednesday and push fluids- hopefully will help BP and Cr.  ? 2/24- BUN still 29 and Cr 1.37- will give IVFs since pushing lfuids isn't working- and recheck in AM- IVFs NS 75 cc/hour x 24 hours ? Creatinine 1.29 on 2/25, improving ? 2/27- Cr/labs pending this AM- will monitor ? 2/28- Cr 1.25- BUN 19- not dry anymore- con't to monitor ?18. Abrasion L buttock ? 2/22- doesn' thave pressure ulcer- per my exam nor WOC- will con't wound care. ?3/2- will double check with nursing, but WOC said was abrasion.   ?19.  Hyponatremia ? Sodium 133 on 2/25-stable ? 2/28- Na 135- ? ? ? ? ?LOS: ?15 days ?A FACE TO FACE EVALUATION WAS PERFORMED ? ?Taesha Goodell ?12/15/2021, 7:44 AM  ? ? ? ?

## 2021-12-15 NOTE — Progress Notes (Signed)
Occupational Therapy Session Note ? ?Patient Details  ?Name: Brandi [name] ?MRN: [id number] ?Date of Birth: 08/01/1956 ? ?Today's Date: 12/15/2021 ?OT Individual Time: 1300-1400 ?OT Individual Time Calculation (min): 60 min  ? ? ?Short Term Goals: ?Week 1:  OT Short Term Goal 1 (Week 1): Pt will don UB clothing with CGA ?OT Short Term Goal 1 - Progress (Week 1): Progressing toward goal ?OT Short Term Goal 2 (Week 1): Pt will complete sit > stand with max A of 1 using LRAD ?OT Short Term Goal 2 - Progress (Week 1): Progressing toward goal ?OT Short Term Goal 3 (Week 1): Pt will complete bed mobility to EOB with CGA ?OT Short Term Goal 3 - Progress (Week 1): Progressing toward goal ? ?Skilled Therapeutic Interventions/Progress Updates:  ?  Pt received in w/c agreeable to OT. NO pain reported. ? ?ADL: ?Pt completes bed level toileting (refused DAC) on bed pan with increased time and tactile cuing to manage Les and roll in B directions to manage pants, place bed pan and complete peri care. Pt overall requires total A for toileting components ? ?Therapeutic exercise ?Pt completes slide board transfers w/c<>EOM/EOB with CGA to place board and supervision for scooting/removing board with cuing to scoot not only to the side but also back onto surface. Pt completes circuit of exercises 3x1 min with 1 min rest after all 3 exercises: sit ups with overhead reach, w/c push ups for tricep extension and manual resistance push/pull from OT with dowel rod.  ? ?Pt left at end of session in bed with exit alarm on, call light in reach and all needs met ? ? ?Therapy Documentation ?Precautions:  ?Precautions ?Precautions: Fall ?Restrictions ?Weight Bearing Restrictions: No ?General: ? ? ?Therapy/Group: Individual Therapy ? ?Brandi [name] ?12/15/2021, 6:58 AM ?

## 2021-12-15 NOTE — Progress Notes (Signed)
Occupational Therapy Session Note ? ?Patient Details  ?Name: Brandi [name] ?MRN: [id number] ?Date of Birth: [birth year] ? ?Today's Date: 12/16/2021 ?OT Individual Time: 1031-1101 ?OT Individual Time Calculation (min): 30 min  ? ?Skilled Therapeutic Interventions/Progress Updates:  ?  Pt greeted in the w/c, reporting that she really wanted to complete pericare because she felt like she was "smelling." Slideboard<bed completed with Min A for LE positioning while she scooted across board. Once pt transitioned to supine she reported urgently needing to void bladder. OT assisted with bedpan placement pt having +bladder void. OT assisted with posterior hygiene and pt then performed frontal hygiene while semi reclined in bed. Increased time required for this, pt motivated to thoroughly clean herself. OT then assisted with brief change and donning pants. Pt unable to effectively bridge at this time, needed to roll Rt>Lt with Min A to meet task demands during session. Pt with some motor planning deficits so increased time and repeated instruction with multimodal cues needed to increase her therapeutic success. Pt remained comfortably in bed at close of session, very grateful for hygiene assistance today. Left pt with all needs within reach and bed alarm set. Tx focus placed on praxis, transfers, and ADL retraining.  ? ?Therapy Documentation ?Precautions:  ?Precautions ?Precautions: Fall ?Restrictions ?Weight Bearing Restrictions: No ?Vital Signs: ?Therapy Vitals ?Temp: 97.7 ?F (36.5 ?C) ?Pulse Rate: 93 ?Resp: 16 ?BP: 105/63 ?Patient Position (if appropriate): Lying ?Oxygen Therapy ?SpO2: 96 % ?O2 Device: Room Air ?Pain: no c/o pain during session ?Pain Assessment ?Pain Score: 7  ?ADL: ?ADL ?Eating: Supervision/safety ?Where Assessed-Eating: Bed level ?Grooming: Supervision/safety ?Where Assessed-Grooming: Edge of bed ?Upper Body Bathing: Minimal assistance ?Where Assessed-Upper Body Bathing: Edge of bed ?Lower Body Bathing:  Moderate assistance ?Where Assessed-Lower Body Bathing: Bed level ?Upper Body Dressing: Minimal cueing, Moderate assistance ?Where Assessed-Upper Body Dressing: Edge of bed ?Lower Body Dressing: Dependent ?Where Assessed-Lower Body Dressing: Bed level ?Toileting: Unable to assess ?Toilet Transfer: Unable to assess ? ? ?Therapy/Group: Individual Therapy ? ?Lucilla Petrenko A Gerilyn Stargell ?12/16/2021, 4:15 PM ?

## 2021-12-15 NOTE — Progress Notes (Addendum)
Physical Therapy Session Note ? ?Patient Details  ?Name: Brandi [name] ?MRN: [id number] ?Date of Birth: August 01, 1956 ? ?Today's Date: 12/15/2021 ?PT Individual Time: 8984-2103  ?PT Individual Time Calculation (min): 58 min  ? ?Short Term Goals: ?Week 2:  PT Short Term Goal 1 (Week 2): Pt will perform sit to stand with max A and LRAD ?PT Short Term Goal 2 (Week 2): Pt will perform least restrictive transfer with assist x 1 ?PT Short Term Goal 3 (Week 2): Pt will perform bed mobility with mod A consistently ? ?Skilled Therapeutic Interventions/Progress Updates: Pt presented in w/c agreeable to therapy. Pt states pain in B knees unable to receive additional pain meds until end of session per nsg. Rest breaks provided as needed throughout session. Pt propelled w/c from room to elevators >248ft with supervision and x 1 rest break. Pt transported remaining distance to day room and set up for Slide board transfer. Pt was able to perform transfer to R with CGA and with cues was able to remove Slide board with supervision. Pt then participated in kicking soft back for BLE activation with pt c/o increased in in R knee with activity. PTA provided grade I/II a-p distraction for pain management followed by hamstring stretch 1 min x 2. Pt states some relief after stretching. Pt then set up with Stedy placed backwards to perform weight shifting forward. Pt was able to perform x 2 with maxA however PTA noted improved BLE activation. PTA then obtained Denna Haggard and removed foot plates. Mat was raised to 24in and pt was able to use Clarise Cruz to assist pulling to more upright position as well as engage BLE to near full knee extension. This activity was performed 2 additional times with pt able to clear buttocks from mat on third attempt without assist AND pt fully extending BLE! After activity pt performed Slide board transfer in same manner as prior and transported back to room. Pt left in w/c at end of session with belt alarm on, call  bell within reach and needs met.  ? ? ?Therapy Documentation ?Precautions:  ?Precautions ?Precautions: Fall ?Restrictions ?Weight Bearing Restrictions: No ?General: ?  ?Vital Signs: ?Therapy Vitals ?Temp: 98.2 ?F (36.8 ?C) ?Temp Source: Oral ?Pulse Rate: 84 ?Resp: 18 ?BP: 95/67 ?Patient Position (if appropriate): Lying ?Oxygen Therapy ?SpO2: 99 % ?O2 Device: Room Air ?Pain: ?  ?Mobility: ?  ?Locomotion : ?   ?Trunk/Postural Assessment : ?   ?Balance: ?  ?Exercises: ?  ?Other Treatments:   ? ? ? ?Therapy/Group: Individual Therapy ? ?Tacoya Altizer ?12/15/2021, 4:11 PM  ?

## 2021-12-16 DIAGNOSIS — I10 Essential (primary) hypertension: Secondary | ICD-10-CM

## 2021-12-16 DIAGNOSIS — E1165 Type 2 diabetes mellitus with hyperglycemia: Secondary | ICD-10-CM | POA: Diagnosis not present

## 2021-12-16 DIAGNOSIS — M1711 Unilateral primary osteoarthritis, right knee: Secondary | ICD-10-CM

## 2021-12-16 DIAGNOSIS — G959 Disease of spinal cord, unspecified: Secondary | ICD-10-CM | POA: Diagnosis not present

## 2021-12-16 LAB — GLUCOSE, CAPILLARY
Glucose-Capillary: 115 mg/dL — ABNORMAL HIGH (ref 70–99)
Glucose-Capillary: 106 mg/dL — ABNORMAL HIGH (ref 70–99)
Glucose-Capillary: 107 mg/dL — ABNORMAL HIGH (ref 70–99)

## 2021-12-16 NOTE — Progress Notes (Signed)
Physical Therapy Weekly Progress Note ? ?Patient Details  ?Name: Brandi [name] ?MRN: [id number] ?Date of Birth: [birth year] ? ?Beginning of progress report period: December 08, 2021 ?End of progress report period: December 16, 2021 ? ?Today's Date: 12/16/2021 ?PT Individual Time: 0900-1000 ?PT Individual Time Calculation (min): 60 min  ? ?Patient has met 2 of 3 short term goals.  Pt is making slow but steady progress towards therapy goals. She is currently min A for rolling in bed with use of bedrails, min A for supine to/from sit with use of hospital bed features, min A for SB transfers with cues for sequencing and safety, and Supervision for w/c mobility with use of BUE up to 150 ft. Pt continues to remain dependent for standing with use of lift equipment including stedy, Sara Plus, and standing frame. Pt does remain limited by difficulty with motor planning as well as ongoing BLE weakness. Pt remains very motivated and exhibits great participation in therapy sessions. ? ?Patient continues to demonstrate the following deficits muscle weakness, decreased cardiorespiratoy endurance, impaired timing and sequencing, unbalanced muscle activation, decreased coordination, and decreased motor planning, decreased attention, decreased awareness, decreased problem solving, and delayed processing, and decreased sitting balance, decreased standing balance, decreased postural control, hemiplegia, and decreased balance strategies and therefore will continue to benefit from skilled PT intervention to increase functional independence with mobility. ? ?Patient progressing toward long term goals..  Continue plan of care. ? ?PT Short Term Goals ?Week 2:  PT Short Term Goal 1 (Week 2): Pt will perform sit to stand with max A and LRAD ?PT Short Term Goal 1 - Progress (Week 2): Progressing toward goal ?PT Short Term Goal 2 (Week 2): Pt will perform least restrictive transfer with assist x 1 ?PT Short Term Goal 2 - Progress (Week 2): Met ?PT  Short Term Goal 3 (Week 2): Pt will perform bed mobility with mod A consistently ?PT Short Term Goal 3 - Progress (Week 2): Met ?Week 3:  PT Short Term Goal 1 (Week 3): =LTG due to ELOS ? ?Skilled Therapeutic Interventions/Progress Updates:  ?  Pt received seated in bed, agreeable to PT session. Pt reports some stiffness in BLE, no complaints of pain and premedicated for pain prior to start of therapy session. Pt is min A to don her bra, setup A to change her shirt while seated in bed. Rolling L/R with min A in order to don pants dependently. Supine to sit with min A for RLE management with heavy reliance on bedrails and HOB slightly elevated. Assisted pt with donning shoes while seated EOB. Pt performs slide board transfers bed to w/c and w/c to/from mat table with min A via [place] technique during session. Pt does require cues for safe LE placement during transfers. Manual w/c propulsion 2 x 100 ft with use of BUE at Supervision level before onset of fatigue. Sit to stand x 5 reps from elevated mat table (24") with use of Sara Plus with footplate removed. Pt able to active B quads and glutes for assisting with sit to stand from semi-perched position in [place]. Pt does fatigue very quickly with standing and requires extended seat rest breaks between reps of standing. Pt agreeable to remain seated in w/c at end of session, needs in reach. ? ?Therapy Documentation ?Precautions:  ?Precautions ?Precautions: Fall ?Restrictions ?Weight Bearing Restrictions: No ? ? ? ? ? ?Therapy/Group: Individual Therapy ? ? ?Excell Seltzer, PT, DPT, CSRS ? ?12/16/2021, 10:59 AM  ?

## 2021-12-16 NOTE — Progress Notes (Signed)
PROGRESS NOTE   Subjective/Complaints:  Overall feels fairly well. Right knee is sore especially with activity.   ROS: Patient denies fever, rash, sore throat, blurred vision, dizziness, nausea, vomiting, diarrhea, cough, shortness of breath or chest pain, back/neck pain, headache, or mood change.   Objective:   No results found. No results for input(s): WBC, HGB, HCT, PLT in the last 72 hours.   No results for input(s): NA, K, CL, CO2, GLUCOSE, BUN, CREATININE, CALCIUM in the last 72 hours.   Intake/Output Summary (Last 24 hours) at 12/16/2021 0913 Last data filed at 12/16/2021 0737 Gross per 24 hour  Intake 577 ml  Output --  Net 577 ml       Physical Exam: Vital Signs Blood pressure 107/61, pulse 82, temperature (!) 97.5 F (36.4 C), resp. rate 18, height '5\' 6"'$  (1.676 m), weight 121.5 kg, SpO2 98 %. Constitutional: No distress . Vital signs reviewed. HEENT: NCAT, EOMI, oral membranes moist Neck: supple Cardiovascular: RRR without murmur. No JVD    Respiratory/Chest: CTA Bilaterally without wheezes or rales. Normal effort    GI/Abdomen: BS +, non-tender, non-distended Ext: no clubbing, cyanosis, or edema Psych: pleasant and cooperative  Extremities: No clubbing, cyanosis, or edema Neurologic: Alert,  mild word finding issues motor strength is /5 in bilateral deltoid, bicep, tricep, grip, 3- bilateral hip flexor, knee extensors, ankle dorsiflexor and plantar flexor Musc: right knee externally rotated with valgus deformity. Associated effusion. Tender with ROM   Assessment/Plan: 1. Functional deficits which require 3+ hours per day of interdisciplinary therapy in a comprehensive inpatient rehab setting. Physiatrist is providing close team supervision and 24 hour management of active medical problems listed below. Physiatrist and rehab team continue to assess barriers to discharge/monitor patient progress toward  functional and medical goals  Care Tool:  Bathing    Body parts bathed by patient: Right arm, Left arm, Chest, Abdomen, Front perineal area, Right upper leg, Left upper leg, Face   Body parts bathed by helper: Buttocks, Right lower leg, Left lower leg     Bathing assist Assist Level: Moderate Assistance - Patient 50 - 74%     Upper Body Dressing/Undressing Upper body dressing   What is the patient wearing?: Pull over shirt, Bra    Upper body assist Assist Level: Minimal Assistance - Patient > 75%    Lower Body Dressing/Undressing Lower body dressing      What is the patient wearing?: Incontinence brief, Pants     Lower body assist Assist for lower body dressing: Total Assistance - Patient < 25%     Toileting Toileting Toileting Activity did not occur (Clothing management and hygiene only): N/A (no void or bm)  Toileting assist Assist for toileting: Total Assistance - Patient < 25%     Transfers Chair/bed transfer  Transfers assist     Chair/bed transfer assist level: Contact Guard/Touching assist Chair/bed transfer assistive device: Sliding board   Locomotion Ambulation   Ambulation assist   Ambulation activity did not occur: Safety/medical concerns          Walk 10 feet activity   Assist  Walk 10 feet activity did not occur: Safety/medical concerns  Walk 50 feet activity   Assist Walk 50 feet with 2 turns activity did not occur: Safety/medical concerns         Walk 150 feet activity   Assist Walk 150 feet activity did not occur: Safety/medical concerns         Walk 10 feet on uneven surface  activity   Assist Walk 10 feet on uneven surfaces activity did not occur: Safety/medical concerns         Wheelchair     Assist Is the patient using a wheelchair?: Yes Type of Wheelchair: Manual    Wheelchair assist level: Supervision/Verbal cueing Max wheelchair distance: 150'    Wheelchair 50 feet with 2 turns  activity    Assist        Assist Level: Supervision/Verbal cueing   Wheelchair 150 feet activity     Assist      Assist Level: Supervision/Verbal cueing   Blood pressure 107/61, pulse 82, temperature (!) 97.5 F (36.4 C), resp. rate 18, height '5\' 6"'$  (1.676 m), weight 121.5 kg, SpO2 98 %.  Medical Problem List and Plan: 1. 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 - Decadron taper             -patient may shower             -ELOS/Goals: 10-12 days, supervision goals with PT and OT   [facility] says Surgical intervention is possible- will likely need to check with family?  2/28- surgeon seeing pt/son this AM- con't CIR- PT and OT  3/1- son still plans to take pt home- d/c 3/14  -Continue CIR therapies including PT, OT   2.  Antithrombotics: -DVT/anticoagulation:  Pharmaceutical: Heparin.               -antiplatelet therapy: Aspirin 325 mg daily 3. Pain Management: Oxycodone as needed Knee pain 2019 xray show severe OA on RIght and Moderate on Left  OA will add voltaren gel   Changed Oxy to q4 hours prn  3/1- having tailbone pain this AM for sitting in place for a long time- con't regimen  3/3- pain is an issue, but taking prn oxy- con't regimen  3/4 encouraged use of voltaren gel. might benefit from OA brace to help stabilize knee for mobility although it appears she's still pre-gait at this point  4. Mood: Trazodone 150 mg nightly, Xanax 0.25 mg twice daily as needed 2/28- d/w pt again that has to ask for meds- also let son know              -antipsychotic agents: N/A 5. Neuropsych: This patient is capable of making decisions on her own behalf. 6. Skin/Wound Care: Routine skin checks             -encourage appropriate nutrition              -pressure relief 7. Fluids/Electrolytes/Nutrition:  .               -added protein supp for low albumin 8.  Diabetes mellitus.  Hemoglobin A1c 6.0.                CBG (last 3)       Controlled on 2/25  3/4- CBGs well  controlled 9.  Hypertension.  Lopressor 25 mg twice daily, hydralazine 25 mg every 8 hours.      Off all BP meds  3/4- BP stable to soft. con't regimen off meds Vitals:   12/15/21 1956 12/16/21 0448  BP:  102/61 107/61  Pulse: 78 82  Resp: 14 18  Temp: 97.7 F (36.5 C) (!) 97.5 F (36.4 C)  SpO2: 98% 98%    10.  Hyperlipidemia.  Zocor 11.  Microcytic anemia.  Continue iron supplement.  Follow-up CBC 12.  Acute urinary retention/neurogenic bladder.  Foley tube removed.   -Presently on Flomax 0.4 mg.   -need pvr's  2/21- last cathed 2/19 early AM- but bladder scans <150 cc since then- off PVRs right now 13.  Constipation   -Senokot twice daily, MiraLAX daily Changed Senna to 2 tabs BID She notes improvement in bowel movements and states she did not take the laxatives yesterday 2/27- reduced laxatives to 3 pills/day. Since was having loose stools- 3 Bms yesterdays.  2/28- Reduced senna to 1 tab/day since on pain meds- but should reduce such frequent BM's.  3/1- still having BM's frequently- con't regimen for now- will wait to drop meds again. 3/2- have decreased frequency and urgency- con't regimen  3/3- will increase Senna to 2 tabs daily and add sorbitol prn if needs it.  3/4 last bm 3/1, meds increased yesterday--observe today. Has prn's available 14.  Obesity.  BMI 39.5.  Dietary follow-up 15.  History of CVA.  Modified independent with straight point cane prior to admission.  Continue aspirin 16.  Leukocytosis.  Suspect steroid-induced--improved to 9.6 2/17  2/20- WBC down to 7.8k- con't regimen  2/28- WBC down to 3.3- will monitor 17. AKI  2/20- Cr up to 1.56 and BUN up to 31- will recheck Wednesday and push fluids- hopefully will help BP and Cr.   2/24- BUN still 29 and Cr 1.37- will give IVFs since pushing lfuids isn't working- and recheck in AM- IVFs NS 75 cc/hour x 24 hours  Creatinine 1.29 on 2/25, improving  2/27- Cr/labs pending this AM- will monitor  2/28- Cr 1.25-  BUN 19- not dry anymore- con't to monitor 18. Abrasion L buttock  2/22- doesn' thave pressure ulcer- per my exam nor WOC- will con't wound care. 3/2- will double check with nursing, but WOC said was abrasion.   19.  Hyponatremia  Sodium 133 on 2/25-stable  2/28- Na 135-     LOS: 16 days A FACE TO FACE EVALUATION WAS PERFORMED  Meredith Staggers 12/16/2021, 9:13 AM

## 2021-12-17 LAB — GLUCOSE, CAPILLARY
Glucose-Capillary: 136 mg/dL — ABNORMAL HIGH (ref 70–99)
Glucose-Capillary: 108 mg/dL — ABNORMAL HIGH (ref 70–99)
Glucose-Capillary: 113 mg/dL — ABNORMAL HIGH (ref 70–99)
Glucose-Capillary: 96 mg/dL (ref 70–99)

## 2021-12-18 DIAGNOSIS — G959 Disease of spinal cord, unspecified: Secondary | ICD-10-CM | POA: Diagnosis not present

## 2021-12-18 LAB — CBC WITH DIFFERENTIAL/PLATELET
Abs Immature Granulocytes: 0 10*3/uL (ref 0.00–0.07)
Basophils Absolute: 0 10*3/uL (ref 0.0–0.1)
Basophils Relative: 1 %
Eosinophils Absolute: 0.1 10*3/uL (ref 0.0–0.5)
Eosinophils Relative: 2 %
HCT: 38.6 % (ref 36.0–46.0)
Hemoglobin: 12 g/dL (ref 12.0–15.0)
Lymphocytes Relative: 52 %
Lymphs Abs: 2.2 10*3/uL (ref 0.7–4.0)
MCH: 23.4 pg — ABNORMAL LOW (ref 26.0–34.0)
MCHC: 31.1 g/dL (ref 30.0–36.0)
MCV: 75.4 fL — ABNORMAL LOW (ref 80.0–100.0)
Monocytes Absolute: 0.3 10*3/uL (ref 0.1–1.0)
Monocytes Relative: 6 %
Neutro Abs: 1.6 10*3/uL — ABNORMAL LOW (ref 1.7–7.7)
Neutrophils Relative %: 39 %
Platelets: 240 10*3/uL (ref 150–400)
RBC: 5.12 MIL/uL — ABNORMAL HIGH (ref 3.87–5.11)
WBC: 4.2 10*3/uL (ref 4.0–10.5)
nRBC: 0 % (ref 0.0–0.2)
nRBC: 0 /100 WBC

## 2021-12-18 LAB — COMPREHENSIVE METABOLIC PANEL
ALT: 21 U/L (ref 0–44)
AST: 16 U/L (ref 15–41)
Albumin: 3.2 g/dL — ABNORMAL LOW (ref 3.5–5.0)
Alkaline Phosphatase: 49 U/L (ref 38–126)
Anion gap: 8 (ref 5–15)
BUN: 16 mg/dL (ref 8–23)
CO2: 25 mmol/L (ref 22–32)
Calcium: 9.6 mg/dL (ref 8.9–10.3)
Chloride: 103 mmol/L (ref 98–111)
Creatinine, Ser: 1.2 mg/dL — ABNORMAL HIGH (ref 0.44–1.00)
GFR, Estimated: 50 mL/min — ABNORMAL LOW (ref >60)
Glucose, Bld: 89 mg/dL (ref 70–99)
Potassium: 4.3 mmol/L (ref 3.5–5.1)
Sodium: 136 mmol/L (ref 135–145)
Total Bilirubin: 0.5 mg/dL (ref 0.3–1.2)
Total Protein: 5.9 g/dL — ABNORMAL LOW (ref 6.5–8.1)

## 2021-12-18 LAB — GLUCOSE, CAPILLARY
Glucose-Capillary: 94 mg/dL (ref 70–99)
Glucose-Capillary: 125 mg/dL — ABNORMAL HIGH (ref 70–99)
Glucose-Capillary: 87 mg/dL (ref 70–99)
Glucose-Capillary: 136 mg/dL — ABNORMAL HIGH (ref 70–99)

## 2021-12-18 NOTE — Progress Notes (Signed)
Physical Therapy Session Note ? ?Patient Details  ?Name: Brandi [name] ?MRN: [id number] ?Date of Birth: Jun 20, 1956 ? ?Today's Date: 12/18/2021 ?PT Individual Time: 1015-1100; 6073-7106 ?PT Individual Time Calculation (min): 45 min and 55 min ? ?Short Term Goals: ?Week 3:  PT Short Term Goal 1 (Week 3): =LTG due to ELOS ? ?Skilled Therapeutic Interventions/Progress Updates:  ?  Session 1: ?Pt received seated in w/c in room, agreeable to PT session. Pt reports some soreness in her knees, premedicated prior to start of therapy session and using Voltarin gel on knees. Dependent transport via w/c to therapy gym for time and energy conservation. Slide board transfer w/c to/from mat table with min A. Sit to stand x 5 reps with Clarise Cruz Plus with focus on quad and glute activation. Manual w/c propulsion x 100 ft with use of BUE at Supervision level. Pt left seated in w/c in room with needs in reach, quick release belt and chair alarm in place at end of session. ? ?Session 2: ?Pt received seated in w/c in room transferring back to bed with nursing via slide board. Assisted nursing with completing safe slide board transfer and demonstrated safe slide board technique. Pt had urinary incontinence while seated in w/c in her room and had been calling for assist to transfer back to bed for pericare. Sit to supine min A for RLE management. Rolling L/R with min A and use of bedrails for dependent brief change and pericare. Sit to/from supine x 3 more reps with min A for RLE management, HOB slightly elevated, use of bedrail. Supine BLE strengthening therex: heel slides x 10 reps, bridges 2 x 10 reps. Pt left seated in bed with needs in reach, bed alarm in place at end of session. ? ?Therapy Documentation ?Precautions:  ?Precautions ?Precautions: Fall ?Restrictions ?Weight Bearing Restrictions: No ? ? ? ? ? ? ?Therapy/Group: Individual Therapy ? ? ?Excell Seltzer, PT, DPT, CSRS ? ?12/18/2021, 12:12 PM  ?

## 2021-12-18 NOTE — Progress Notes (Signed)
PROGRESS NOTE   Subjective/Complaints:  Pt reports she and son are worried- how to get hospital bed.  LBM overnight- x2. Feels good.    ROS:  Pt denies SOB, abd pain, CP, N/V/C/D, and vision changes   Objective:   No results found. Recent Labs    12/18/21 0508  WBC 4.2  HGB 12.0  HCT 38.6  PLT 240     Recent Labs    12/18/21 0508  NA 136  K 4.3  CL 103  CO2 25  GLUCOSE 89  BUN 16  CREATININE 1.20*  CALCIUM 9.6     Intake/Output Summary (Last 24 hours) at 12/18/2021 0809 Last data filed at 12/17/2021 1809 Gross per 24 hour  Intake 591 ml  Output --  Net 591 ml       Physical Exam: Vital Signs Blood pressure 110/69, pulse 85, temperature (!) 97.5 F (36.4 C), temperature source Oral, resp. rate 18, height '5\' 6"'$  (1.676 m), weight 121.5 kg, SpO2 98 %.    General: awake, alert, appropriate, sitting up in bed; NAD HENT: conjugate gaze; oropharynx moist CV: regular rate; no JVD Pulmonary: CTA B/L; no W/R/R- good air movement GI: soft, NT, ND, (+)BS- protuberant Psychiatric: appropriate Neurological: Ox3  Ext: no clubbing, cyanosis, or edema Psych: pleasant and cooperative  Extremities: No clubbing, cyanosis, or edema Neurologic: Alert,  mild word finding issues motor strength is /5 in bilateral deltoid, bicep, tricep, grip, 3- bilateral hip flexor, knee extensors, ankle dorsiflexor and plantar flexor Musc: right knee externally rotated with valgus deformity. Associated effusion. Tender with ROM   Assessment/Plan: 1. Functional deficits which require 3+ hours per day of interdisciplinary therapy in a comprehensive inpatient rehab setting. Physiatrist is providing close team supervision and 24 hour management of active medical problems listed below. Physiatrist and rehab team continue to assess barriers to discharge/monitor patient progress toward functional and medical goals  Care  Tool:  Bathing    Body parts bathed by patient: Right arm, Left arm, Chest, Abdomen, Front perineal area, Right upper leg, Left upper leg, Face   Body parts bathed by helper: Buttocks, Right lower leg, Left lower leg     Bathing assist Assist Level: Moderate Assistance - Patient 50 - 74%     Upper Body Dressing/Undressing Upper body dressing   What is the patient wearing?: Pull over shirt, Bra    Upper body assist Assist Level: Minimal Assistance - Patient > 75%    Lower Body Dressing/Undressing Lower body dressing      What is the patient wearing?: Incontinence brief, Pants     Lower body assist Assist for lower body dressing: Total Assistance - Patient < 25%     Toileting Toileting Toileting Activity did not occur (Clothing management and hygiene only): N/A (no void or bm)  Toileting assist Assist for toileting: Total Assistance - Patient < 25%     Transfers Chair/bed transfer  Transfers assist     Chair/bed transfer assist level: Minimal Assistance - Patient > 75% Chair/bed transfer assistive device: Sliding board   Locomotion Ambulation   Ambulation assist   Ambulation activity did not occur: Safety/medical concerns  Walk 10 feet activity   Assist  Walk 10 feet activity did not occur: Safety/medical concerns        Walk 50 feet activity   Assist Walk 50 feet with 2 turns activity did not occur: Safety/medical concerns         Walk 150 feet activity   Assist Walk 150 feet activity did not occur: Safety/medical concerns         Walk 10 feet on uneven surface  activity   Assist Walk 10 feet on uneven surfaces activity did not occur: Safety/medical concerns         Wheelchair     Assist Is the patient using a wheelchair?: Yes Type of Wheelchair: Manual    Wheelchair assist level: Supervision/Verbal cueing Max wheelchair distance: 100'    Wheelchair 50 feet with 2 turns activity    Assist         Assist Level: Supervision/Verbal cueing   Wheelchair 150 feet activity     Assist      Assist Level: Supervision/Verbal cueing   Blood pressure 110/69, pulse 85, temperature (!) 97.5 F (36.4 C), temperature source Oral, resp. rate 18, height '5\' 6"'$  (1.676 m), weight 121.5 kg, SpO2 98 %.  Medical Problem List and Plan: 1. 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 - Decadron taper             -patient may shower             -ELOS/Goals: 10-12 days, supervision goals with PT and OT   [facility] says Surgical intervention is possible- will likely need to check with family?  2/28- surgeon seeing pt/son this AM- con't CIR- PT and OT  3/1- son still plans to take pt home- d/c 3/14  Con't CIR- PT and OT 2.  Antithrombotics: -DVT/anticoagulation:  Pharmaceutical: Heparin.               -antiplatelet therapy: Aspirin 325 mg daily 3. Pain Management: Oxycodone as needed Knee pain 2019 xray show severe OA on RIght and Moderate on Left  OA will add voltaren gel   Changed Oxy to q4 hours prn  3/1- having tailbone pain this AM for sitting in place for a long time- con't regimen  3/3- pain is an issue, but taking prn oxy- con't regimen  3/4 encouraged use of voltaren gel. might benefit from OA brace to help stabilize knee for mobility although it appears she's still pre-gait at this point 3/6- will d/w therapy if knee brace would be helpful.   4. Mood: Trazodone 150 mg nightly, Xanax 0.25 mg twice daily as needed 2/28- d/w pt again that has to ask for meds- also let son know              -antipsychotic agents: N/A 5. Neuropsych: This patient is capable of making decisions on her own behalf. 6. Skin/Wound Care: Routine skin checks             -encourage appropriate nutrition              -pressure relief 7. Fluids/Electrolytes/Nutrition:  .               -added protein supp for low albumin 8.  Diabetes mellitus.  Hemoglobin A1c 6.0.                CBG (last 3)       3/6- CBGs  94-136- con't regimen  9.  Hypertension.  Lopressor 25 mg twice daily, hydralazine  25 mg every 8 hours.      Off all BP meds  3/6- BP soft- con't off meds- asymptomatic Vitals:   12/17/21 1311 12/17/21 1953  BP: (!) 109/57 110/69  Pulse: 87 85  Resp: 17 18  Temp: 97.9 F (36.6 C) (!) 97.5 F (36.4 C)  SpO2: 98% 98%    10.  Hyperlipidemia.  Zocor 11.  Microcytic anemia.  Continue iron supplement.  Follow-up CBC 12.  Acute urinary retention/neurogenic bladder.  Foley tube removed.   -Presently on Flomax 0.4 mg.   -need pvr's  2/21- last cathed 2/19 early AM- but bladder scans <150 cc since then- off PVRs right now 13.  Constipation   -Senokot twice daily, MiraLAX daily Changed Senna to 2 tabs BID She notes improvement in bowel movements and states she did not take the laxatives yesterday 2/27- reduced laxatives to 3 pills/day. Since was having loose stools- 3 Bms yesterdays.  2/28- Reduced senna to 1 tab/day since on pain meds- but should reduce such frequent BM's.  3/1- still having BM's frequently- con't regimen for now- will wait to drop meds again. 3/2- have decreased frequency and urgency- con't regimen  3/3- will increase Senna to 2 tabs daily and add sorbitol prn if needs it.  3/6- LBM overnight x2- large- con't regimen 14.  Obesity.  BMI 39.5.  Dietary follow-up 15.  History of CVA.  Modified independent with straight point cane prior to admission.  Continue aspirin 16.  Leukocytosis.  Suspect steroid-induced--improved to 9.6 2/17  2/20- WBC down to 7.8k- con't regimen  2/28- WBC down to 3.3- will monitor 17. AKI  2/20- Cr up to 1.56 and BUN up to 31- will recheck Wednesday and push fluids- hopefully will help BP and Cr.   2/24- BUN still 29 and Cr 1.37- will give IVFs since pushing lfuids isn't working- and recheck in AM- IVFs NS 75 cc/hour x 24 hours  Creatinine 1.29 on 2/25, improving  2/27- Cr/labs pending this AM- will monitor  2/28- Cr 1.25- BUN 19- not dry  anymore- con't to monitor  3/6- Cr 1.20- doing much better- and BUN 16-  18. Abrasion L buttock  2/22- doesn' thave pressure ulcer- per my exam nor WOC- will con't wound care. 3/2- will double check with nursing, but WOC said was abrasion.   19.  Hyponatremia  Sodium 133 on 2/25-stable  2/28- Na 135-  I spent a total of  36  minutes on total care today- >50% coordination of care- due to d/w son about hospital bed- we will get for pt. She will need. But we will help arrange- he doesn't have to do on his own.     LOS: 18 days A FACE TO FACE EVALUATION WAS PERFORMED  Flois Mctague 12/18/2021, 8:09 AM

## 2021-12-18 NOTE — Progress Notes (Signed)
Occupational Therapy Session Note ? ?Patient Details  ?Name: LENAH MESSENGER ?MRN: [id number] ?Date of Birth: 10-04-1956 ? ?Today's Date: 12/18/2021 ?OT Individual Time: 1975-8832 ?OT Individual Time Calculation (min): 71 min  ? ? ?Short Term Goals: ?Week 2:  OT Short Term Goal 1 (Week 2): Pt will don UB clothing with CGA ?OT Short Term Goal 1 - Progress (Week 2): Met ?OT Short Term Goal 2 (Week 2): Pt will complete sit > stand with max A of 1 using LRAD ?OT Short Term Goal 2 - Progress (Week 2): Discontinued (comment) ?OT Short Term Goal 3 (Week 2): Pt will complete bed mobility to EOB with CGA ?OT Short Term Goal 3 - Progress (Week 2): Progressing toward goal ?OT Short Term Goal 4 (Week 2): Pt will complete SB DABSC transfer with mod A+1 ?OT Short Term Goal 5 (Week 2): Pt will complete LB dressing tasks with max A at bed level ? ?Skilled Therapeutic Interventions/Progress Updates:  ?  Pt in bed upon arrival. Bed wet but not pad or brief. Pt requested to cleanse periarea because she "smelled like fish." Tot A for bathing periarea. Extra time required for LB bathing tasks this morning. Max A for LB dressing at bed level and seated EOB for donning shoes. SB tranfser to w/c with CGA/min A and mod verbal cues for sequencing. Pt able to reposition in w/c with supervision. UB bathing/dressing seated in w/c at sink with assistance only to fasten bra. Pt remained in w/c with belt alarm activated. All needs within reach.  ? ?Therapy Documentation ?Precautions:  ?Precautions ?Precautions: Fall ?Restrictions ?Weight Bearing Restrictions: No ? ?Pain: ?Pt reports 10/10 back and Bil knee pain; meds admin during session ? ? ?Therapy/Group: Individual Therapy ? ?[name] ?12/18/2021, 9:33 AM ?

## 2021-12-19 DIAGNOSIS — G959 Disease of spinal cord, unspecified: Secondary | ICD-10-CM | POA: Diagnosis not present

## 2021-12-19 LAB — GLUCOSE, CAPILLARY
Glucose-Capillary: 103 mg/dL — ABNORMAL HIGH (ref 70–99)
Glucose-Capillary: 105 mg/dL — ABNORMAL HIGH (ref 70–99)
Glucose-Capillary: 109 mg/dL — ABNORMAL HIGH (ref 70–99)
Glucose-Capillary: 110 mg/dL — ABNORMAL HIGH (ref 70–99)

## 2021-12-19 NOTE — Progress Notes (Signed)
Physical Therapy Session Note ? ?Patient Details  ?Name: Brandi [name] ?MRN: [id number] ?Date of Birth: 01/30/1956 ? ?Today's Date: 12/19/2021 ?PT Individual Time: 4098-1191; 4782-9562 ?PT Individual Time Calculation (min): 30 min and 75 min ? ?Short Term Goals: ?Week 3:  PT Short Term Goal 1 (Week 3): =LTG due to ELOS ? ?Skilled Therapeutic Interventions/Progress Updates:  ?  Session 1: ?Pt received supine in bed, agreeable to PT session. Pt reports her knee pain is well controlled this AM, premedicated prior to start of therapy session. Pt reports urinary incontinence. Pt also reports further urge to void, rolling L/R with min A for placement of bedpan. Pt able to continently void urine in bedpan. Rolling L/R with min A for dependent pericare, brief change, and donning of pants. Supine to sit with min A for RLE management, HOB elevated and use of bedrail. Pt is able to change into a clean shirt while seated EOB. Assisted pt with donning socks and shoes. Slide board transfer to w/c with min A. Pt left seated up in w/c in room with needs in reach at end of session. ? ?Session 2: ?Pt received seated in w/c in room, agreeable to PT session. No complaints of pain, reports she has been premedicated for knee pain. Manual w/c propulsion x 100 ft with use of BUE at Supervision level. Slide board transfer w/c to/from mat table with min A. Sit to stand x 2 reps to stedy from elevated mat table with max to total A x 2. Pt has urinary incontinence during sit to stand transfer due to exertion. Transfer back to bed for brief change and pericare. Sit to supine min A needed for RLE management. Rolling L/R with min A for dependent brief change and pericare. Pt returned to sitting EOB with min A for RLE management, HOB elevated and use of bedrail. Seated balance/core strengthening performing volleyball with 3# dowel rod, 3 x 30 reps to fatigue. Seated ball kicks with alt LE x 30 reps to fatigue. Pt returned to supine at end of  session, left supine in bed with needs in reach, bed alarm in place. ? ?Therapy Documentation ?Precautions:  ?Precautions ?Precautions: Fall ?Restrictions ?Weight Bearing Restrictions: No ? ? ? ? ? ? ?Therapy/Group: Individual Therapy ? ? ?Excell Seltzer, PT, DPT, CSRS ? ?12/19/2021, 11:25 AM  ?

## 2021-12-19 NOTE — Patient Care Conference (Signed)
Inpatient RehabilitationTeam Conference and Plan of Care Update ?Date: 12/19/2021   Time: 11:00 AM  ? ? ?Patient Name: Brandi [name]      ?Medical Record Number: [id number]  ?Date of Birth: [birth year] ?Sex: Female         ?Room/Bed: 4M05C/4M05C-01 ?Payor Info: Payor: [employer] / Plan: [facility] MEDICARE / Product Type: *No Product type* /   ? ?Admit Date/Time:  11/30/2021  5:17 PM ? ?Primary Diagnosis:  Myelopathy ([place]) ? ?Hospital Problems: Principal Problem: ?  Myelopathy ([place]) ?Active Problems: ?  Dyslipidemia ?  History of hypertension ?  Controlled type 2 diabetes mellitus with hyperglycemia, without long-term current use of insulin ([place]) ?  AKI (acute kidney injury) ([place]) ?  Pressure injury of skin ? ? ? ?Expected Discharge Date: Expected Discharge Date: 12/26/21 ? ?Team Members Present: ?Physician leading conference: Dr. Courtney Heys ?Social Worker Present: Loralee Pacas, LCSWA ?Nurse Present: Dorthula Nettles, RN ?PT Present: Excell Seltzer, PT ?OT Present: Roanna Epley, Claris Gladden, OT ?PPS Coordinator present : Gunnar Fusi, SLP ? ?   Current Status/Progress Goal Weekly Team Focus  ?Bowel/Bladder ? ? Pt continent of B/B with occasional incontinence. LBM 12/17/2021.  Regular BMs every 3 days or less  Assess B/B every shift and PRN   ?Swallow/Nutrition/ Hydration ? ?           ?ADL's ? ? bed mobility-min A: LB dressing at bed level-max A; UB bathing/dressing w/c level-supervisin; SB transfers-min A/CGA  min A overall w/c level  toleting, LB ADLs, education, bed mobility   ?Mobility ? ? min A bed mobility, min A SB transfer, dependent to stand, Supervision w/c mobility 100 ft  min to mod A at w/c level  standing as able, transfers, w/c mobility, LE NMR and strengthening   ?Communication ? ?           ?Safety/Cognition/ Behavioral Observations ?           ?Pain ? ? pt reporting 8/10 pain to back and BL knee pain. Pt using PRN oxycodone and voltaren gel to knees  pain rating of <3/10  Assess  pain every shift and PRN   ?Skin ? ? blister to left buttock  no new breakdown  assess skin every shift and PRN   ? ? ?Discharge Planning:  ?Pt to d/c to home with her son [name] who will provide 24/7 care. Patient will confirm no barriers to discharge.   ?Team Discussion: ?Recommended knee brace. Taking excess amounts of Oxy IR for knee pain. Cr and BUN improving, no longer dry. Continent B/B, reports 10/10 pain to buttocks. Scab to right ankle. Ramp has been built.  May need hospital bed.  ?  ?Patient on target to meet rehab goals: ?yes, min assist WC level. Currently min assist bed mobility, slide board. Working on standing. Working on getting right leg off bed, needs assist. Total/max assist lower body ADL's. ? ?*See Care Plan and progress notes for long and short-term goals.  ? ?Revisions to Treatment Plan:  ?Adjusting medications. ?  ?Teaching Needs: ?Family education, medication/pain management, skin/wound care, transfer training, etc. ?  ?Current Barriers to Discharge: ?Pain ? ?Possible Resolutions to Barriers: ?Continue adjusting pain medication ?  ? ? Medical Summary ?Current Status: continent of B/B; pain in buttocks and knees B/L - doens't look raw/ scab R ankle; taking oxy for pain prn; ? Barriers to Discharge: Decreased family/caregiver support;Home enviroment access/layout;Weight;Weight bearing restrictions;Wound care;Insurance for SNF coverage ? Barriers to Discharge Comments: ramp  for first 5 stairs- and will bump up for last few steps; home with son; ?Possible Resolutions to [employer] Focus: min A sliding Board- w/c level mainly-bed level ADLs at lower body; making progress- no limitations except LE weakness- d/c 3/14 ? ? ?Continued Need for Acute Rehabilitation Level of Care: The patient requires daily medical management by a physician with specialized training in physical medicine and rehabilitation for the following reasons: ?Direction of a multidisciplinary physical rehabilitation  program to maximize functional independence : Yes ?Medical management of patient stability for increased activity during participation in an intensive rehabilitation regime.: Yes ?Analysis of laboratory values and/or radiology reports with any subsequent need for medication adjustment and/or medical intervention. : Yes ? ? ?I attest that I was present, lead the team conference, and concur with the assessment and plan of the team. ? ? ?Dorthula Nettles G ?12/19/2021, 3:06 PM  ? ? ? ? ? ? ?

## 2021-12-19 NOTE — Progress Notes (Signed)
Occupational Therapy Session Note ? ?Patient Details  ?Name: Brandi [name] ?MRN: [id number] ?Date of Birth: [birth year] ? ?Today's Date: 12/19/2021 ?OT Individual Time: 8453-6468 ?OT Individual Time Calculation (min): 70 min  ? ? ?Short Term Goals: ?Week 2:  OT Short Term Goal 1 (Week 2): Pt will don UB clothing with CGA ?OT Short Term Goal 1 - Progress (Week 2): Met ?OT Short Term Goal 2 (Week 2): Pt will complete sit > stand with max A of 1 using LRAD ?OT Short Term Goal 2 - Progress (Week 2): Discontinued (comment) ?OT Short Term Goal 3 (Week 2): Pt will complete bed mobility to EOB with CGA ?OT Short Term Goal 3 - Progress (Week 2): Progressing toward goal ?OT Short Term Goal 4 (Week 2): Pt will complete SB DABSC transfer with mod A+1 ?OT Short Term Goal 5 (Week 2): Pt will complete LB dressing tasks with max A at bed level ? ?Skilled Therapeutic Interventions/Progress Updates:  ? Pt resting in w/c upon arrival. Pt propelled w/c from room to main gym with supervision. OT intervention with focus on SB tranfsers, dynamic sitting balance, sit<>stand in [place], and activity tolerance to increase independence with BADLs. SB transfers with CGA with min verbal cues after SB placement. Pt engaged in tossing ball (soccer and 1kg) against rebounder while seated EOM-4x12 each. Pt also engaged in tapping therapy ball with 5# bar while seated EOM-3x12. Sit<>stand from EOM with Stedy unsuccessful x3 this morning. Pt propelled w/c back to room with supervision. Pt remained in w/c with all needs within reach. Belt alarm activated.  ? ?Therapy Documentation ?Precautions:  ?Precautions ?Precautions: Fall ?Restrictions ?Weight Bearing Restrictions: No ?  ?Pain: ?Pt denies pain this morning ? ?Therapy/Group: Individual Therapy ? ?[name] ?12/19/2021, 9:47 AM ?

## 2021-12-19 NOTE — Progress Notes (Signed)
PROGRESS NOTE   Subjective/Complaints:  Pt reports doing well- no issues; LBM yesterday- bowels doing well right now.  Ate 50% breakfast.   ROS:   Pt denies SOB, abd pain, CP, N/V/C/D, and vision changes   Objective:   No results found. Recent Labs    12/18/21 0508  WBC 4.2  HGB 12.0  HCT 38.6  PLT 240     Recent Labs    12/18/21 0508  NA 136  K 4.3  CL 103  CO2 25  GLUCOSE 89  BUN 16  CREATININE 1.20*  CALCIUM 9.6     Intake/Output Summary (Last 24 hours) at 12/19/2021 0818 Last data filed at 12/19/2021 0800 Gross per 24 hour  Intake 591 ml  Output --  Net 591 ml       Physical Exam: Vital Signs Blood pressure 104/63, pulse 69, temperature 98.2 F (36.8 C), temperature source Oral, resp. rate 16, height '5\' 6"'$  (1.676 m), weight 121.5 kg, SpO2 100 %.     General: awake, alert, appropriate, sitting up watching TV; finished 50% breakfast; NAD HENT: conjugate gaze; oropharynx moist CV: regular rate; no JVD Pulmonary: CTA B/L; no W/R/R- good air movement GI: soft, NT, ND, (+)BS Psychiatric: appropriate Neurological: aphasic, but stable; alert Ext: no clubbing, cyanosis, or edema Psych: pleasant and cooperative  Extremities: No clubbing, cyanosis, or edema Neurologic: Alert,  mild word finding issues motor strength is /5 in bilateral deltoid, bicep, tricep, grip, 3- bilateral hip flexor, knee extensors, ankle dorsiflexor and plantar flexor Musc: right knee externally rotated with valgus deformity. Associated effusion. Tender with ROM   Assessment/Plan: 1. Functional deficits which require 3+ hours per day of interdisciplinary therapy in a comprehensive inpatient rehab setting. Physiatrist is providing close team supervision and 24 hour management of active medical problems listed below. Physiatrist and rehab team continue to assess barriers to discharge/monitor patient progress toward functional  and medical goals  Care Tool:  Bathing    Body parts bathed by patient: Right arm, Left arm, Chest, Abdomen, Front perineal area, Right upper leg, Left upper leg, Face   Body parts bathed by helper: Buttocks, Right lower leg, Left lower leg     Bathing assist Assist Level: Moderate Assistance - Patient 50 - 74%     Upper Body Dressing/Undressing Upper body dressing   What is the patient wearing?: Pull over shirt    Upper body assist Assist Level: Supervision/Verbal cueing    Lower Body Dressing/Undressing Lower body dressing      What is the patient wearing?: Incontinence brief, Pants     Lower body assist Assist for lower body dressing: Total Assistance - Patient < 25%     Toileting Toileting Toileting Activity did not occur (Clothing management and hygiene only): N/A (no void or bm)  Toileting assist Assist for toileting: Total Assistance - Patient < 25%     Transfers Chair/bed transfer  Transfers assist     Chair/bed transfer assist level: Minimal Assistance - Patient > 75% Chair/bed transfer assistive device: Sliding board   Locomotion Ambulation   Ambulation assist   Ambulation activity did not occur: Safety/medical concerns  Walk 10 feet activity   Assist  Walk 10 feet activity did not occur: Safety/medical concerns        Walk 50 feet activity   Assist Walk 50 feet with 2 turns activity did not occur: Safety/medical concerns         Walk 150 feet activity   Assist Walk 150 feet activity did not occur: Safety/medical concerns         Walk 10 feet on uneven surface  activity   Assist Walk 10 feet on uneven surfaces activity did not occur: Safety/medical concerns         Wheelchair     Assist Is the patient using a wheelchair?: Yes Type of Wheelchair: Manual    Wheelchair assist level: Supervision/Verbal cueing Max wheelchair distance: 100'    Wheelchair 50 feet with 2 turns  activity    Assist        Assist Level: Supervision/Verbal cueing   Wheelchair 150 feet activity     Assist      Assist Level: Supervision/Verbal cueing   Blood pressure 104/63, pulse 69, temperature 98.2 F (36.8 C), temperature source Oral, resp. rate 16, height '5\' 6"'$  (1.676 m), weight 121.5 kg, SpO2 100 %.  Medical Problem List and Plan: 1. 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 - Decadron taper             -patient may shower             -ELOS/Goals: 10-12 days, supervision goals with PT and OT   [facility] says Surgical intervention is possible- will likely need to check with family?  2/28- surgeon seeing pt/son this AM- con't CIR- PT and OT  3/1- son still plans to take pt home- d/c 3/14  3/7- team conference today to f/u on progress  Con't CIR- PT and OT  2.  Antithrombotics: -DVT/anticoagulation:  Pharmaceutical: Heparin.               -antiplatelet therapy: Aspirin 325 mg daily 3. Pain Management: Oxycodone as needed Knee pain 2019 xray show severe OA on RIght and Moderate on Left  OA will add voltaren gel   Changed Oxy to q4 hours prn  3/1- having tailbone pain this AM for sitting in place for a long time- con't regimen  3/3- pain is an issue, but taking prn oxy- con't regimen  3/4 encouraged use of voltaren gel. might benefit from OA brace to help stabilize knee for mobility although it appears she's still pre-gait at this point 3/6- will d/w therapy if knee brace would be helpful.   4. Mood: Trazodone 150 mg nightly, Xanax 0.25 mg twice daily as needed 2/28- d/w pt again that has to ask for meds- also let son know              -antipsychotic agents: N/A 5. Neuropsych: This patient is capable of making decisions on her own behalf. 6. Skin/Wound Care: Routine skin checks             -encourage appropriate nutrition              -pressure relief 7. Fluids/Electrolytes/Nutrition:  .               -added protein supp for low albumin 8.  Diabetes  mellitus.  Hemoglobin A1c 6.0.                CBG (last 3)       3/7- 87-136- well controlled- con't regimen 9.  Hypertension.  Lopressor 25 mg twice daily, hydralazine 25 mg every 8 hours.      Off all BP meds  3/6- BP soft- con't off meds- asymptomatic Vitals:   12/18/21 1939 12/19/21 0436  BP: 105/62 104/63  Pulse: 85 69  Resp: 16 16  Temp: 98.6 F (37 C) 98.2 F (36.8 C)  SpO2: 96% 100%    10.  Hyperlipidemia.  Zocor 11.  Microcytic anemia.  Continue iron supplement.  Follow-up CBC 12.  Acute urinary retention/neurogenic bladder.  Foley tube removed.   -Presently on Flomax 0.4 mg.   -need pvr's  2/21- last cathed 2/19 early AM- but bladder scans <150 cc since then- off PVRs right now 13.  Constipation   -Senokot twice daily, MiraLAX daily Changed Senna to 2 tabs BID She notes improvement in bowel movements and states she did not take the laxatives yesterday 2/27- reduced laxatives to 3 pills/day. Since was having loose stools- 3 Bms yesterdays.  2/28- Reduced senna to 1 tab/day since on pain meds- but should reduce such frequent BM's.  3/1- still having BM's frequently- con't regimen for now- will wait to drop meds again. 3/2- have decreased frequency and urgency- con't regimen  3/3- will increase Senna to 2 tabs daily and add sorbitol prn if needs it.  3/6- LBM overnight x2- large- con't regimen  3/7- LBM yesterday- going well per pt.  14.  Obesity.  BMI 39.5.  Dietary follow-up 15.  History of CVA.  Modified independent with straight point cane prior to admission.  Continue aspirin 16.  Leukocytosis.  Suspect steroid-induced--improved to 9.6 2/17  2/20- WBC down to 7.8k- con't regimen  2/28- WBC down to 3.3- will monitor 17. AKI  2/20- Cr up to 1.56 and BUN up to 31- will recheck Wednesday and push fluids- hopefully will help BP and Cr.   2/24- BUN still 29 and Cr 1.37- will give IVFs since pushing lfuids isn't working- and recheck in AM- IVFs NS 75 cc/hour x 24  hours  Creatinine 1.29 on 2/25, improving  2/27- Cr/labs pending this AM- will monitor  2/28- Cr 1.25- BUN 19- not dry anymore- con't to monitor  3/6- Cr 1.20- doing much better- and BUN 16-  18. Abrasion L buttock  2/22- doesn' thave pressure ulcer- per my exam nor WOC- will con't wound care. 3/2- will double check with nursing, but WOC said was abrasion.   19.  Hyponatremia  Sodium 133 on 2/25-stable  2/28- Na 135-  I spent a total of 36   minutes on total care today- >50% coordination of care- due to d/w team about knee brace and team conference today     LOS: 19 days A FACE TO FACE EVALUATION WAS PERFORMED  Brandi [name] 12/19/2021, 8:18 AM

## 2021-12-19 NOTE — Progress Notes (Signed)
Patient ID: Brandi [name], female   DOB: 02/12/1956, 66 y.o.   MRN: [id number] ? ?SW made efforts to provide pt with updates from team conference, but pt not in room. SW will continue to make efforts.  ? ?[id number] spoke with pt son Brandi [name] [phone]) to provide updates from team conference, d/c date remains 3/14, and d/c recommendations. No HHA preference. He would like SW to order a w/c, DABSC, TTB, and hospital bed as not able to find these items. SW informed a slide board will be ordered as well. When discussing family edu, he reported he will be here this evening overnight with his mother and can be here for therapies tomorrow. SW asked him to remain until therapies are completed tomorrow.  ? ?Loralee Pacas, MSW, LCSWA ?Office: [phone] ?Cell: [phone] ?Fax: [phone]  ?

## 2021-12-20 DIAGNOSIS — G959 Disease of spinal cord, unspecified: Secondary | ICD-10-CM | POA: Diagnosis not present

## 2021-12-20 LAB — GLUCOSE, CAPILLARY
Glucose-Capillary: 108 mg/dL — ABNORMAL HIGH (ref 70–99)
Glucose-Capillary: 118 mg/dL — ABNORMAL HIGH (ref 70–99)
Glucose-Capillary: 93 mg/dL (ref 70–99)
Glucose-Capillary: 100 mg/dL — ABNORMAL HIGH (ref 70–99)

## 2021-12-20 NOTE — Progress Notes (Signed)
PROGRESS NOTE   Subjective/Complaints:  Pt reports no issues. Son at bedside and since they don't have car, who to get his mother/pt home. Asked to speak with SW to arrange  Wound care- Josh assessing this AM- has an abrasion due to medical device on R ankle and abrasion on buttocks healed.    ROS:   Pt denies SOB, abd pain, CP, N/V/C/D, and vision changes    Objective:   No results found. Recent Labs    12/18/21 0508  WBC 4.2  HGB 12.0  HCT 38.6  PLT 240     Recent Labs    12/18/21 0508  NA 136  K 4.3  CL 103  CO2 25  GLUCOSE 89  BUN 16  CREATININE 1.20*  CALCIUM 9.6     Intake/Output Summary (Last 24 hours) at 12/20/2021 0803 Last data filed at 12/19/2021 1820 Gross per 24 hour  Intake 480 ml  Output --  Net 480 ml       Physical Exam: Vital Signs Blood pressure 108/62, pulse 80, temperature 97.9 F (36.6 C), temperature source Oral, resp. rate 16, height '5\' 6"'$  (1.676 m), weight 121.5 kg, SpO2 98 %.      General: awake, alert, appropriate, son at bedside; NAD HENT: conjugate gaze; oropharynx moist CV: regular rate; no JVD Pulmonary: CTA B/L; no W/R/R- good air movement GI: soft, NT, ND, (+)BS- protuberant Psychiatric: appropriate Neurological: somewhat aphasic- but alert Ext: no clubbing, cyanosis, or edema Psych: pleasant and cooperative  Extremities: No clubbing, cyanosis, or edema Neurologic: Alert,  mild word finding issues motor strength is /5 in bilateral deltoid, bicep, tricep, grip, 3- bilateral hip flexor, knee extensors, ankle dorsiflexor and plantar flexor Musc: right knee externally rotated with valgus deformity. Associated effusion. Tender with ROM Skin: abrasion on L buttock healed; has deeper abrasion on R lateral ankle above lateral malleolus- ~65m deep.   Assessment/Plan: 1. Functional deficits which require 3+ hours per day of interdisciplinary therapy in a  comprehensive inpatient rehab setting. Physiatrist is providing close team supervision and 24 hour management of active medical problems listed below. Physiatrist and rehab team continue to assess barriers to discharge/monitor patient progress toward functional and medical goals  Care Tool:  Bathing    Body parts bathed by patient: Right arm, Left arm, Chest, Abdomen, Front perineal area, Right upper leg, Left upper leg, Face   Body parts bathed by helper: Buttocks, Right lower leg, Left lower leg     Bathing assist Assist Level: Moderate Assistance - Patient 50 - 74%     Upper Body Dressing/Undressing Upper body dressing   What is the patient wearing?: Pull over shirt    Upper body assist Assist Level: Supervision/Verbal cueing    Lower Body Dressing/Undressing Lower body dressing      What is the patient wearing?: Incontinence brief, Pants     Lower body assist Assist for lower body dressing: Total Assistance - Patient < 25%     Toileting Toileting Toileting Activity did not occur (Clothing management and hygiene only): N/A (no void or bm)  Toileting assist Assist for toileting: Total Assistance - Patient < 25%     Transfers Chair/bed  transfer  Transfers assist     Chair/bed transfer assist level: Minimal Assistance - Patient > 75% Chair/bed transfer assistive device: Sliding board   Locomotion Ambulation   Ambulation assist   Ambulation activity did not occur: Safety/medical concerns          Walk 10 feet activity   Assist  Walk 10 feet activity did not occur: Safety/medical concerns        Walk 50 feet activity   Assist Walk 50 feet with 2 turns activity did not occur: Safety/medical concerns         Walk 150 feet activity   Assist Walk 150 feet activity did not occur: Safety/medical concerns         Walk 10 feet on uneven surface  activity   Assist Walk 10 feet on uneven surfaces activity did not occur: Safety/medical  concerns         Wheelchair     Assist Is the patient using a wheelchair?: Yes Type of Wheelchair: Manual    Wheelchair assist level: Supervision/Verbal cueing Max wheelchair distance: 100'    Wheelchair 50 feet with 2 turns activity    Assist        Assist Level: Supervision/Verbal cueing   Wheelchair 150 feet activity     Assist      Assist Level: Supervision/Verbal cueing   Blood pressure 108/62, pulse 80, temperature 97.9 F (36.6 C), temperature source Oral, resp. rate 16, height '5\' 6"'$  (1.676 m), weight 121.5 kg, SpO2 98 %.  Medical Problem List and Plan: 1. 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 - Decadron taper             -patient may shower             -ELOS/Goals: 10-12 days, supervision goals with PT and OT   [facility] says Surgical intervention is possible- will likely need to check with family?  2/28- surgeon seeing pt/son this AM- con't CIR- PT and OT   d/c 3/14  3/8- con't CIR- PT and OT- son asking how to get pt home from hospital. Asked to speak with SW 2.  Antithrombotics: -DVT/anticoagulation:  Pharmaceutical: Heparin.               -antiplatelet therapy: Aspirin 325 mg daily 3. 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 might benefit from OA brace to help stabilize knee for mobility although it appears she's still pre-gait at this point   3/8- pt not walking- to bring in braces from home she already has.  4. Mood: Trazodone 150 mg nightly, Xanax 0.25 mg twice daily as needed 2/28- d/w pt again that has to ask for meds- also let son know              -antipsychotic agents: N/A 5. Neuropsych: This patient is capable of making decisions on her own behalf. 6. Skin/Wound Care: Routine skin checks             -encourage appropriate nutrition              -pressure relief 7.  Fluids/Electrolytes/Nutrition:  .               -added protein supp for low albumin 8.  Diabetes mellitus.  Hemoglobin A1c 6.0.  CBG (last 3)       3/7- 87-136- well controlled- con't regimen 9.  Hypertension.  Lopressor 25 mg twice daily, hydralazine 25 mg every 8 hours.      Off all BP meds  3/6- BP soft- con't off meds- asymptomatic Vitals:   12/19/21 1251 12/20/21 0500  BP: 109/60 108/62  Pulse: 83 80  Resp: 18 16  Temp: 98.1 F (36.7 C) 97.9 F (36.6 C)  SpO2: 98% 98%    10.  Hyperlipidemia.  Zocor 11.  Microcytic anemia.  Continue iron supplement.  Follow-up CBC 12.  Acute urinary retention/neurogenic bladder.  Foley tube removed.   -Presently on Flomax 0.4 mg.   -need pvr's  2/21- last cathed 2/19 early AM- but bladder scans <150 cc since then- off PVRs right now 13.  Constipation   -Senokot twice daily, MiraLAX daily Changed Senna to 2 tabs BID She notes improvement in bowel movements and states she did not take the laxatives yesterday 2/27- reduced laxatives to 3 pills/day. Since was having loose stools- 3 Bms yesterdays.  2/28- Reduced senna to 1 tab/day since on pain meds- but should reduce such frequent BM's.  3/1- still having BM's frequently- con't regimen for now- will wait to drop meds again. 3/2- have decreased frequency and urgency- con't regimen  3/3- will increase Senna to 2 tabs daily and add sorbitol prn if needs it.  3/6- LBM overnight x2- large- con't regimen  3/7- LBM yesterday- going well per pt.  14.  Obesity.  BMI 39.5.  Dietary follow-up 15.  History of CVA.  Modified independent with straight point cane prior to admission.  Continue aspirin 16.  Leukocytosis.  Suspect steroid-induced--improved to 9.6 2/17  2/20- WBC down to 7.8k- con't regimen  2/28- WBC down to 3.3- will monitor 17. AKI  2/20- Cr up to 1.56 and BUN up to 31- will recheck Wednesday and push fluids- hopefully will help BP and Cr.   2/24- BUN still 29 and Cr 1.37-  will give IVFs since pushing lfuids isn't working- and recheck in AM- IVFs NS 75 cc/hour x 24 hours  Creatinine 1.29 on 2/25, improving  2/27- Cr/labs pending this AM- will monitor  2/28- Cr 1.25- BUN 19- not dry anymore- con't to monitor  3/6- Cr 1.20- doing much better- and BUN 16-  18. Abrasion L buttock and R ankle  2/22- doesn' thave pressure ulcer- per my exam nor WOC- will con't wound care. 3/2- will double check with nursing, but WOC said was abrasion.    3/8- L buttock healed; R ankle from brace?- healing- ~ 36m deep 19.  Hyponatremia  Sodium 133 on 2/25-stable  2/28- Na 135-      LOS: 20 days A FACE TO FACE EVALUATION WAS PERFORMED  Anatalia Kronk 12/20/2021, 8:03 AM

## 2021-12-20 NOTE — Progress Notes (Signed)
Physical Therapy Session Note ? ?Patient Details  ?Name: Brandi [name] ?MRN: [id number] ?Date of Birth: Oct 26, 1955 ? ?Today's Date: 12/20/2021 ?PT Individual Time: 7741-2878 ?PT Individual Time Calculation (min): 45 min  ? ?Short Term Goals: ?Week 1:  PT Short Term Goal 1 (Week 1): Pt will transfer to [facility] with Slide board and max assist ?PT Short Term Goal 1 - Progress (Week 1): Met ?PT Short Term Goal 2 (Week 1): Pt will performed sit<>stand in parallel bars with max assist +2 ?PT Short Term Goal 2 - Progress (Week 1): Progressing toward goal ?PT Short Term Goal 3 (Week 1): Pt will propel WC 126f with supervision assist ?PT Short Term Goal 3 - Progress (Week 1): Met ?PT Short Term Goal 4 (Week 1): pt will tolerate sitting up in WC >2 hours between therapies in WC ?PT Short Term Goal 4 - Progress (Week 1): Met ?PT Short Term Goal 5 - Progress (Week 1): Revised due to lack of progress ?Week 2:  PT Short Term Goal 1 (Week 2): Pt will perform sit to stand with max A and LRAD ?PT Short Term Goal 1 - Progress (Week 2): Progressing toward goal ?PT Short Term Goal 2 (Week 2): Pt will perform least restrictive transfer with assist x 1 ?PT Short Term Goal 2 - Progress (Week 2): Met ?PT Short Term Goal 3 (Week 2): Pt will perform bed mobility with mod A consistently ?PT Short Term Goal 3 - Progress (Week 2): Met ? ?Skilled Therapeutic Interventions/Progress Updates:  ?  Pt seated in w/c on arrival and agreeable to therapy. No complaint of pain. Pt's son MRonalee Beltspresent for family education. Pt transported to therapy gym for time management and energy conservation. Pt performed slideboard transfer with therapist and son with min A, focus on education. Transitioned to car transfer, pt performed x 2 with son. Extended time for explanation and demonstration. Returned to room, pt requested to return to bed for bed pan. slideboard transfer to bed in the same manner, mod A bed mobility for time. CGA rolling to place bed pan. Pt  remained on bed pan with her son present, requested NT to assist off pan.  ? ?Therapy Documentation ?Precautions:  ?Precautions ?Precautions: Fall ?Restrictions ?Weight Bearing Restrictions: No ?General: ?  ? ? ? ?Therapy/Group: Individual Therapy ? ?[place]?12/20/2021, 12:55 PM  ?

## 2021-12-20 NOTE — Progress Notes (Signed)
Physical Therapy Session Note ? ?Patient Details  ?Name: Brandi [name] ?MRN: [id number] ?Date of Birth: 06/15/1956 ? ?Today's Date: 12/20/2021 ?PT Individual Time: 1400-1500 ?PT Individual Time Calculation (min): 60 min  ? ?Short Term Goals: ?Week 3:  PT Short Term Goal 1 (Week 3): =LTG due to ELOS ? ?Skilled Therapeutic Interventions/Progress Updates:  ?  Pt received seated in bed, agreeable to PT session. Pt reports some pain in her R knee > L knee, premedicated prior to start of therapy session. Pt's son Ronalee Belts present during session for family education. Reviewed pt's current mobility level, that she will be w/c level upon d/c home, etc. Ronalee Belts also reports that they have a ramp built for entry into the home with no stairs that need to be navigated via w/c. Pt's son was able to practice assisting pt with bed mobility, slide board transfers bed to/from w/c, and car transfers earlier this date and feels comfortable with her current assist level. Pt is min A for supine to sit with some assist needed for RLE management. Slide board transfer bed to/from w/c with min A. Manual w/c propulsion x 100 ft with use of BUE at Supervision level. Sit to stand in standing frame x 2 reps with increased assist needed from sling this date. Pt exhibits increased fatigue with standing activity as well as increase in knee pain this date. Deferred further standing activity due to fatigue and pain. Remainder of session focus on w/c mobility weaving through cones and turning w/c around as well as brake management. Pt's son reports he and other family will assist with leg rest management. Pt requests to return to bed due to fatigue. Slide board transfer back to bed with min A. Sit to supine min A for RLE management. Pt left seated in bed with needs in reach, bed alarm in place, son present. Pt missed 15 min of scheduled therapy session due to fatigue. ? ?Therapy Documentation ?Precautions:  ?Precautions ?Precautions:  Fall ?Restrictions ?Weight Bearing Restrictions: No ?General: ?PT Amount of Missed Time (min): 15 Minutes ?PT Missed Treatment Reason: Patient fatigue ? ? ? ? ? ?Therapy/Group: Individual Therapy ? ? ?Excell Seltzer, PT, DPT, CSRS ? ?12/20/2021, 4:05 PM  ?

## 2021-12-20 NOTE — Plan of Care (Signed)
?  Problem: Consults ?Goal: RH GENERAL PATIENT EDUCATION ?Description: See Patient Education module for education specifics. ?Outcome: Progressing ?  ?Problem: RH BOWEL ELIMINATION ?Goal: RH STG MANAGE BOWEL WITH ASSISTANCE ?Description: STG Manage Bowel with Supervision Assistance. ?Outcome: Progressing ?Goal: RH STG MANAGE BOWEL W/MEDICATION W/ASSISTANCE ?Description: STG Manage Bowel with Medication with Supervision Assistance. ?Outcome: Progressing ?  ?Problem: RH BLADDER ELIMINATION ?Goal: RH STG MANAGE BLADDER WITH ASSISTANCE ?Description: STG Manage Bladder With Supervision Assistance ?Outcome: Progressing ?Goal: RH STG MANAGE BLADDER WITH MEDICATION WITH ASSISTANCE ?Description: STG Manage Bladder With Medication With Supervision Assistance. ?Outcome: Progressing ?  ?Problem: RH SKIN INTEGRITY ?Goal: RH STG MAINTAIN SKIN INTEGRITY WITH ASSISTANCE ?Description: STG Maintain Skin Integrity With Supervision Assistance. ?Outcome: Progressing ?  ?Problem: RH SAFETY ?Goal: RH STG ADHERE TO SAFETY PRECAUTIONS W/ASSISTANCE/DEVICE ?Description: STG Adhere to Safety Precautions With Cues and Reminders. ?Outcome: Progressing ?Goal: RH STG DECREASED RISK OF FALL WITH ASSISTANCE ?Description: STG Decreased Risk of Fall With Supervision Assistance. ?Outcome: Progressing ?  ?Problem: RH PAIN MANAGEMENT ?Goal: RH STG PAIN MANAGED AT OR BELOW PT'S PAIN GOAL ?Description: < 3 on a 0-10 pain scale. ?Outcome: Progressing ?  ?Problem: RH KNOWLEDGE DEFICIT GENERAL ?Goal: RH STG INCREASE KNOWLEDGE OF SELF CARE AFTER HOSPITALIZATION ?Description: Patient will demonstrate knowledge of self-care, medication/pain management, bowel/bladder management, and weight bearing precautions with educational materials and handouts provided by staff independently at discharge. ?Outcome: Progressing ?  ?

## 2021-12-20 NOTE — Progress Notes (Signed)
Occupational Therapy Session Note ? ?Patient Details  ?Name: Brandi [name] ?MRN: [id number] ?Date of Birth: 10/27/1955 ? ?Today's Date: 12/20/2021 ?OT Individual Time: 0630-1601 ?OT Individual Time Calculation (min): 90 min  ? ? ?Short Term Goals: ?Week 4:  OT Short Term Goal 1 (Week 4): STG=LTG 2/2 ELOS ? ?Skilled Therapeutic Interventions/Progress Updates:  ?  Pt resting in bed upon arrival and reported she needed to urinate. Pt declined use of DABSC. Dependent for placement of bedpan. Pt's son present for education. Pt unable to void. Peri hygiene performed by OTA. Demonstrated LB dressing to son and educated on body mechanics. Recommended LB dressing and toileting at bed level upon discharge. Educated son on placement of incontinence brief. Bed mobility with min A for RLE mgmt using bed rails. Sidelying>sit EOB with supervision. Demonstrated SB transfer to w/c. Educated son on placement and technique. Transfer with CGA. Pt completed UB bathing/dressing w/c level at sink with son assisting PRN. Pt remained seated in w/c with all needs within reach. Belt alarm activated and son present.  ? ?Therapy Documentation ?Precautions:  ?Precautions ?Precautions: Fall ?Restrictions ?Weight Bearing Restrictions: No ?  ?Pain: ?Pain Assessment ?Pain Score: 0-No pain ? ? ?Therapy/Group: Individual Therapy ? ?[name] ?12/20/2021, 11:23 AM ?

## 2021-12-20 NOTE — Progress Notes (Shared)
Occupational Therapy Weekly Progress Note ? ?Patient Details  ?Name: Brandi [name] ?MRN: [id number] ?Date of Birth: 08/21/1956 ? ?Beginning of progress report period: December 13, 2021 ?End of progress report period: December 20, 2021 ? ?Patient has met 2 of 3 short term goals.  Pt is making steady progress with bathing/dressing and functional transfers. Pt is able to roll R/L in bed usng bed rails with min A/CGA. Supine>sit EOB with min A for RLE mgmt but is able to push up from sidelying with CGA. Sitting balance with supervision. Pt requires max A for LB bathing/dressing tasks at bed level. UB bathing/dressing tasks with supervision at w/c level. SB tranfsers with min A/CGA. Toileting at bed level. Pt's son has been present for therapy and is scheduled for continued education. ? ?Patient continues to demonstrate the following deficits: muscle weakness, decreased cardiorespiratoy endurance, impaired timing and sequencing, unbalanced muscle activation, and decreased coordination, and decreased standing balance, decreased postural control, and decreased balance strategies and therefore will continue to benefit from skilled OT intervention to enhance overall performance with BADL and Reduce care partner burden. ? ?Patient progressing toward long term goals..  Continue plan of care. ? ?OT Short Term Goals ?Week 3:  OT Short Term Goal 1 (Week 3): Pt will complete SB DABSC transfer with mod A+1 ?OT Short Term Goal 1 - Progress (Week 3): Met ?OT Short Term Goal 2 (Week 3): Pt will complete bed mobility to EOB with CGA ?OT Short Term Goal 2 - Progress (Week 3): Progressing toward goal ?OT Short Term Goal 3 (Week 3): Pt will complete LB dressing tasks with max A at bed level ?OT Short Term Goal 3 - Progress (Week 3): Met ?Week 4:  OT Short Term Goal 1 (Week 4): STG=LTG 2/2 ELOS ? ?   ? ? ?[name] ?12/20/2021, 6:56 AM  ?

## 2021-12-20 NOTE — Progress Notes (Signed)
Patient ID: Brandi [name], female   DOB: 1956/07/23, 66 y.o.   MRN: [id number] ? ?SW ordered DME: hospital bed, w/c, slide board, and bariatric DABSC.  ? ?SW sent HHPT/OT/aide referral to [place] and waiting on follow-up.  ? ?Loralee Pacas, MSW, LCSWA ?Office: [phone] ?Cell: [phone] ?Fax: [phone]  ?

## 2021-12-21 DIAGNOSIS — G959 Disease of spinal cord, unspecified: Secondary | ICD-10-CM | POA: Diagnosis not present

## 2021-12-21 LAB — GLUCOSE, CAPILLARY
Glucose-Capillary: 99 mg/dL (ref 70–99)
Glucose-Capillary: 108 mg/dL — ABNORMAL HIGH (ref 70–99)
Glucose-Capillary: 108 mg/dL — ABNORMAL HIGH (ref 70–99)
Glucose-Capillary: 107 mg/dL — ABNORMAL HIGH (ref 70–99)

## 2021-12-21 NOTE — Progress Notes (Signed)
Occupational Therapy Session Note ? ?Patient Details  ?Name: Brandi [name] ?MRN: [id number] ?Date of Birth: [birth year] ? ?Today's Date: 12/21/2021 ?OT Individual Time: 8250-5397 ?OT Individual Time Calculation (min): 73 min  ? ? ?Short Term Goals: ?Week 4:  OT Short Term Goal 1 (Week 4): STG=LTG 2/2 ELOS ? ?Skilled Therapeutic Interventions/Progress Updates:  ?  Pt resting in bed upon arrival. LB dressing at bed level with max A for don pants. CGA for rolling R/L in bed using bed rails. Supine>sit EOB using bed rails with min A for RLE mgmt to place RLE off EOB. Sidelying>sit EOB with HOB elevated at 20* with supervsion. Pt will have hospital bed at home. SB transfer to w/c with CGA and min verbal cues for technique/sequencing. Pt completed UB bathing/dressing w/c level at sink with supervision. Pt propelled w/c to gym with supervision. Pt engaged in [place] activities with therapy ball with focus on sitting balance and BUE/BLE strengthening. Pt propelled w/c to ortho gym to await group therapy.  ? ?Therapy Documentation ?Precautions:  ?Precautions ?Precautions: Fall ?Restrictions ?Weight Bearing Restrictions: No ?  ?Pain: ? Pt denies pain ? ? ?Therapy/Group: Individual Therapy ? ?[name] ?12/21/2021, 10:47 AM ?

## 2021-12-21 NOTE — Progress Notes (Signed)
Physical Therapy Session Note ? ?Patient Details  ?Name: Brandi [name] ?MRN: [id number] ?Date of Birth: January 29, 1956 ? ?Today's Date: 12/21/2021 ?PT Individual Time: 4098-1191 ?PT Individual Time Calculation (min): 43 min  ? ?Short Term Goals: ?Week 3:  PT Short Term Goal 1 (Week 3): =LTG due to ELOS ? ?Skilled Therapeutic Interventions/Progress Updates: Pt presented in w/c agreeable to therapy. Pt states B knees feel "stiff" deferred any standing activities today. Pt propelled w/c from room to ortho gym with supervision and increased time. Pt participated in seated LE therex including LAQ, and hip flexion with 1.5lb cuff. Pt also performed hamstring curls with red theraband. Pt was unable to flex R knee past 90degrees to increase resistance. All activities performed 2 x10 bilaterally. Pt propelled back to room and performed Slide board transfer with CGA fading to close supervision w/c to bed. Pt required minA for sit to supine but was able to reposition self in bed with verbal cues only. Pt left in bed at end of session with bed alarm on, call bell within reach and needs met.  ?   ? ?Therapy Documentation ?Precautions:  ?Precautions ?Precautions: Fall ?Restrictions ?Weight Bearing Restrictions: No ?General: ?  ?Vital Signs: ?Therapy Vitals ?Temp: 98.4 ?F (36.9 ?C) ?Pulse Rate: 69 ?Resp: 16 ?BP: (!) 146/79 ?Patient Position (if appropriate): Sitting ?Oxygen Therapy ?SpO2: 100 % ?Pain: ?  ?Mobility: ?  ?Locomotion : ?   ?Trunk/Postural Assessment : ?   ?Balance: ?  ?Exercises: ?  ?Other Treatments:   ? ? ? ?Therapy/Group: Individual Therapy ? ?Jonan Seufert ?12/21/2021, 4:17 PM  ?

## 2021-12-21 NOTE — Progress Notes (Addendum)
Recreational Therapy Session Note ? ?Patient Details  ?Name: Brandi [name] ?MRN: [id number] ?Date of Birth: September 17, 1956 ?Today's Date: 12/21/2021 ? ?Pain: c/o back discomfort at end of session, repositioning assisted with temporary relief ?Skilled Therapeutic Interventions/Progress Updates: Pt participated in stress managment/coping group today.  Pt education/discussion focused on stress exploration including factors that contribute to stress, factors that protect against stress and potential coping strategies.  Coping strategies included deep breathing, progressive muscle relaxation, imagery & challenging irrational thoughts.  Pt demonstrated techniques given min verbal cues.  Handouts provided.Pt appreciative of this session. ? ?Therapy/Group: Group Therapy ? ? ?Boleslaw Borghi ?12/21/2021, 3:26 PM  ?

## 2021-12-21 NOTE — Progress Notes (Signed)
Occupational Therapy Session Note ? ?Patient Details  ?Name: Brandi [name] ?MRN: [id number] ?Date of Birth: November 24, 1955 ? ?Today's Date: 12/21/2021 ?OT Group Time: 1100-1200 ?OT Group Time Calculation (min): 60 min ? ? ?Short Term Goals: ?Week 4:  OT Short Term Goal 1 (Week 4): STG=LTG 2/2 ELOS ? ?Skilled Therapeutic Interventions/Progress Updates:  ?Pt participated in group session with a focus on stress mgmt, education on healthy coping strategies, and social interaction. Focus of session on providing coping strategies to manage new current level of function as a result of new diagnosis.  Session focus on breaking down stressors into ?daily hassles,? ?major life stressors? and ?life circumstances? in an effort to allow pts to chunk their stressors into groups. Pt actively sharing stressors and contributing to group conversation. Provided active listening, emotional support and therapeutic use of self. Offered education on factors that protect [place] against stress such as ?daily uplifts,? ?healthy coping strategies? and ?protective factors.? Encouraged all group members to make an effort to actively recall one event from their day that was a daily uplift in an effort to protect their mindset from stressors as well as sharing this information with their caregivers to facilitate improved caregiver communication and decrease overall burden of care.  Issued pt handouts on healthy coping strategies to implement into routine. Pt transported back to room by this OTA where pt was left up in w/c with alarm belt activated and all needs within reach.  ? ?Therapy Documentation ?Precautions:  ?Precautions ?Precautions: Fall ?Restrictions ?Weight Bearing Restrictions: No ? ?  ?Pain: unrated pain reported in knees and buttock, asked for pain meds from RN. Provided rest breaks and repositioning options as needed.  ? ? ? ?Therapy/Group: Group Therapy ? ?Precious Haws ?12/21/2021, 12:14 PM ?

## 2021-12-21 NOTE — Progress Notes (Signed)
Patient ID: Brandi [name], female   DOB: May 14, 1956, 67 y.o.   MRN: [id number] ? ?SW waiting on update about HHPT/OT/aide referral to [facility]. ? ?Ambulance transport scheduled with [facility] [phone]) fo rpick up on date of d/c (3/14) at 10am.  ? ?*[facility] reported OT would be delayed 4 weeks due to staffing but PT could begin within 4 days. OT recommends earlier start of therapy.  ? ?SW sent referral to [facility] and referral declined due to staffing.  ? ?SW sent referral to [facility] and waiting on follow-up.  ?*declined as no branch in [place].  ? ?SW sent referral to [facility] an waiting on follow-up.  ? ?Declined HHAs ?[facility] ?[facility]- delay in start of care; 4 weeks out for OT. ?[facility] ? ?Loralee Pacas, MSW, LCSWA ?Office: [phone] ?Cell: [phone] ?Fax: [phone]  ?

## 2021-12-21 NOTE — Progress Notes (Signed)
PROGRESS NOTE   Subjective/Complaints:  Pt reports doing well-  LBM 2 days ago, but denies constipation. Thinks can go today.  Asking for incontinence supplies at home since has issues. Will check with SW to see if pt can get them- she meets criteria.    ROS:   Pt denies SOB, abd pain, CP, N/V/C/D, and vision changes     Objective:   No results found. No results for input(s): WBC, HGB, HCT, PLT in the last 72 hours.    No results for input(s): NA, K, CL, CO2, GLUCOSE, BUN, CREATININE, CALCIUM in the last 72 hours.    Intake/Output Summary (Last 24 hours) at 12/21/2021 0851 Last data filed at 12/21/2021 0636 Gross per 24 hour  Intake 840 ml  Output --  Net 840 ml       Physical Exam: Vital Signs Blood pressure 130/66, pulse 68, temperature 98.2 F (36.8 C), temperature source Oral, resp. rate 16, height '5\' 6"'$  (1.676 m), weight 121.5 kg, SpO2 97 %.       General: awake, alert, appropriate, sitting up in bed;  NAD HENT: conjugate gaze; oropharynx moist CV: regular rate; no JVD Pulmonary: CTA B/L; no W/R/R- good air movement GI: soft, NT, ND, (+)BS- protuberant; but normoactive BS Psychiatric: appropriate but more anxious about asking this AM Neurological: alert- aphasic- slightly worse this Am, but got anxious Ext: no clubbing, cyanosis, or edema Psych: pleasant and cooperative  Extremities: No clubbing, cyanosis, or edema Neurologic: Alert,  mild word finding issues motor strength is /5 in bilateral deltoid, bicep, tricep, grip, 3- bilateral hip flexor, knee extensors, ankle dorsiflexor and plantar flexor Musc: right knee externally rotated with valgus deformity. Associated effusion. Tender with ROM Skin: abrasion on L buttock healed; has deeper abrasion on R lateral ankle above lateral malleolus- ~62m deep.   Assessment/Plan: 1. Functional deficits which require 3+ hours per day of interdisciplinary  therapy in a comprehensive inpatient rehab setting. Physiatrist is providing close team supervision and 24 hour management of active medical problems listed below. Physiatrist and rehab team continue to assess barriers to discharge/monitor patient progress toward functional and medical goals  Care Tool:  Bathing    Body parts bathed by patient: Right arm, Left arm, Chest, Abdomen, Front perineal area, Right upper leg, Left upper leg, Face   Body parts bathed by helper: Buttocks, Right lower leg, Left lower leg     Bathing assist Assist Level: Moderate Assistance - Patient 50 - 74%     Upper Body Dressing/Undressing Upper body dressing   What is the patient wearing?: Pull over shirt    Upper body assist Assist Level: Supervision/Verbal cueing    Lower Body Dressing/Undressing Lower body dressing      What is the patient wearing?: Incontinence brief, Pants     Lower body assist Assist for lower body dressing: Maximal Assistance - Patient 25 - 49%     Toileting Toileting Toileting Activity did not occur (Clothing management and hygiene only): N/A (no void or bm)  Toileting assist Assist for toileting: Total Assistance - Patient < 25%     Transfers Chair/bed transfer  Transfers assist     Chair/bed transfer  assist level: Minimal Assistance - Patient > 75% Chair/bed transfer assistive device: Sliding board   Locomotion Ambulation   Ambulation assist   Ambulation activity did not occur: Safety/medical concerns          Walk 10 feet activity   Assist  Walk 10 feet activity did not occur: Safety/medical concerns        Walk 50 feet activity   Assist Walk 50 feet with 2 turns activity did not occur: Safety/medical concerns         Walk 150 feet activity   Assist Walk 150 feet activity did not occur: Safety/medical concerns         Walk 10 feet on uneven surface  activity   Assist Walk 10 feet on uneven surfaces activity did not occur:  Safety/medical concerns         Wheelchair     Assist Is the patient using a wheelchair?: Yes Type of Wheelchair: Manual    Wheelchair assist level: Supervision/Verbal cueing Max wheelchair distance: 100'    Wheelchair 50 feet with 2 turns activity    Assist        Assist Level: Supervision/Verbal cueing   Wheelchair 150 feet activity     Assist      Assist Level: Supervision/Verbal cueing   Blood pressure 130/66, pulse 68, temperature 98.2 F (36.8 C), temperature source Oral, resp. rate 16, height '5\' 6"'$  (1.676 m), weight 121.5 kg, SpO2 97 %.  Medical Problem List and Plan: 1. 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 - Decadron taper             -patient may shower             -ELOS/Goals: 10-12 days, supervision goals with PT and OT   [facility] says Surgical intervention is possible- will likely need to check with family?  2/28- surgeon seeing pt/son this AM- con't CIR- PT and OT   d/c 3/14  Con't CIR- PT, and OT- per SW, arranging ride home, however cannot get incontinence supplies due to only medicaid covering.  2.  Antithrombotics: -DVT/anticoagulation:  Pharmaceutical: Heparin.               -antiplatelet therapy: Aspirin 325 mg daily 3. 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 might benefit from OA brace to help stabilize knee for mobility although it appears she's still pre-gait at this point   3/8- pt not walking- to bring in braces from home she already has.  4. Mood: Trazodone 150 mg nightly, Xanax 0.25 mg twice daily as needed 2/28- d/w pt again that has to ask for meds- also let son know              -antipsychotic agents: N/A 5. Neuropsych: This patient is capable of making decisions on her own behalf. 6. Skin/Wound Care: Routine skin checks             -encourage appropriate  nutrition              -pressure relief 7. Fluids/Electrolytes/Nutrition:  .               -added protein supp for low albumin 8.  Diabetes mellitus.  Hemoglobin A1c 6.0.                CBG (last  3)       3/9- CBGs 93-118- con't regimen- well controlled 9.  Hypertension.  Lopressor 25 mg twice daily, hydralazine 25 mg every 8 hours.      Off all BP meds  3/9- BP controlled off meds- con't regimen Vitals:   12/20/21 0500 12/21/21 0500  BP: 108/62 130/66  Pulse: 80 68  Resp: 16 16  Temp: 97.9 F (36.6 C) 98.2 F (36.8 C)  SpO2: 98% 97%    10.  Hyperlipidemia.  Zocor 11.  Microcytic anemia.  Continue iron supplement.  Follow-up CBC 12.  Acute urinary retention/neurogenic bladder.  Foley tube removed.   -Presently on Flomax 0.4 mg.   -need pvr's  2/21- last cathed 2/19 early AM- but bladder scans <150 cc since then- off PVRs right now 13.  Constipation   -Senokot twice daily, MiraLAX daily Changed Senna to 2 tabs BID She notes improvement in bowel movements and states she did not take the laxatives yesterday 2/27- reduced laxatives to 3 pills/day. Since was having loose stools- 3 Bms yesterdays.  2/28- Reduced senna to 1 tab/day since on pain meds- but should reduce such frequent BM's.  3/1- still having BM's frequently- con't regimen for now- will wait to drop meds again. 3/2- have decreased frequency and urgency- con't regimen  3/3- will increase Senna to 2 tabs daily and add sorbitol prn if needs it.  3/9- LBM 2 days ago- but thinks can go today 14.  Obesity.  BMI 39.5.  Dietary follow-up 15.  History of CVA.  Modified independent with straight point cane prior to admission.  Continue aspirin 16.  Leukocytosis.  Suspect steroid-induced--improved to 9.6 2/17  2/20- WBC down to 7.8k- con't regimen  2/28- WBC down to 3.3- will monitor 17. AKI  2/20- Cr up to 1.56 and BUN up to 31- will recheck Wednesday and push fluids- hopefully will help BP and Cr.   2/24- BUN still 29 and Cr  1.37- will give IVFs since pushing lfuids isn't working- and recheck in AM- IVFs NS 75 cc/hour x 24 hours  Creatinine 1.29 on 2/25, improving  2/27- Cr/labs pending this AM- will monitor  2/28- Cr 1.25- BUN 19- not dry anymore- con't to monitor  3/6- Cr 1.20- doing much better- and BUN 16-  18. Abrasion L buttock and R ankle  2/22- doesn' thave pressure ulcer- per my exam nor WOC- will con't wound care. 3/2- will double check with nursing, but WOC said was abrasion.    3/8- L buttock healed; R ankle from brace?- healing- ~ 7m deep 19.  Hyponatremia  Sodium 133 on 2/25-stable  2/28- Na 135-  3/9- Na 136-    I spent a total of 36   minutes on total care today- >50% coordination of care- due to d/w SW about ride home and incontinence supplies    LOS: 21 days A FACE TO FACE EVALUATION WAS PERFORMED  Austyn Seier 12/21/2021, 8:51 AM

## 2021-12-22 LAB — GLUCOSE, CAPILLARY
Glucose-Capillary: 87 mg/dL (ref 70–99)
Glucose-Capillary: 95 mg/dL (ref 70–99)
Glucose-Capillary: 114 mg/dL — ABNORMAL HIGH (ref 70–99)
Glucose-Capillary: 94 mg/dL (ref 70–99)

## 2021-12-22 NOTE — Progress Notes (Signed)
Recreational Therapy Discharge Summary ?Patient Details  ?Name: Brandi [name] ?MRN: [id number] ?Date of Birth: Dec 04, 1955 ?Today's Date: 12/22/2021 ? ?Long term goals set: 1 ? ?Long term goals met: 1 ? ?Comments on progress toward goals: Pt has made good progress during LOS and is scheduled for discharge home on 3/14 with family to provide supervision/assistance.  TR sessions focused on pt education with an emphasis on leisure education, activity analysis/modifications, relaxation training/stress management.  Also discussed importance of social, emotional, spiritual health in addition to physical health an its impact on overall health and wellness.  Pt requires supervision cues and/or set up assistance for seated tasks.   ? ?Reasons goals not met: n/a ? ?Equipment acquired: na/ ? ?Reasons for discharge: discharge from hospital ? ?Follow-up: Home Health ? ?Patient/family agrees with progress made and goals achieved: Yes ? ?Pamla Pangle ?12/22/2021, 8:46 AM ? ? ?

## 2021-12-22 NOTE — Progress Notes (Signed)
Occupational Therapy Session Note ? ?Patient Details  ?Name: SALVADOR BIGBEE ?MRN: [id number] ?Date of Birth: 1956/03/23 ? ?Today's Date: 12/22/2021 ?OT Individual Time: 1937-9024 ?OT Individual Time Calculation (min): 75 min  ? ? ?Short Term Goals: ?Week 4:  OT Short Term Goal 1 (Week 4): STG=LTG 2/2 ELOS ? ?Skilled Therapeutic Interventions/Progress Updates:  ?  OT intervention with focus on LB dressing, bed mobility, SB transfers to bariatric DABSC, toielting, SB tranfsers to w/c, w/c mobility, and safety awareness to increase independence with BADLs. Supine>sit EOB with min A for RLE mgmt off EOB. SB transfer to bariatric DABSC and w/c with CGA +2 for safety. Tot A for toileting tasks. Pt able to perform lateral leans but dependent for hygiene and clothing mgmt. Pt will required a bariatric DABSC secondary to pt's body habitus. Standard DABSC will not accomodate pt's body habitus. W/c mobility with supervision. Pt engaged in seated activities tossing medium therapy ball and tapping with 5# bar. Pt pr0pelled w/c back to room. Pt remained in w/c with all needs withn reach and belt alarm activated.  ? ?Therapy Documentation ?Precautions:  ?Precautions ?Precautions: Fall ?Restrictions ?Weight Bearing Restrictions: No ?  ?Pain: ?Pt c/o bil Knee pain; meds requested ? ? ? ?Therapy/Group: Individual Therapy ? ?[name] ?12/22/2021, 12:09 PM ?

## 2021-12-22 NOTE — Progress Notes (Signed)
Physical Therapy Session Note ? ?Patient Details  ?Name: Brandi [name] ?MRN: [id number] ?Date of Birth: [birth year] ? ?Today's Date: 12/22/2021 ?PT Individual Time: 7793-9688 ?PT Individual Time Calculation (min): 83 min  ? ?Short Term Goals: ?Week 3:  PT Short Term Goal 1 (Week 3): =LTG due to ELOS ? ?Skilled Therapeutic Interventions/Progress Updates: Pt presented in w/c with rehab tech present agreeable to therapy. Pt states pain in B knees and requesting PTA to apply voltaren gel. Once completed pt transported to rehab  gym for time management. Pt performed Slide board transfer to mat with CGA and min cues for pushing down with BLE. Pt then set up with Clarise Cruz with footplates removed. Pt attempted x 2 stands from elevated surface with Clarise Cruz. Pt was limited by pain and weakness however on second attempt pt was able to fully extend LLE and with facilitation from PTA contract and clear RLE away from leg guard. Pt then indicating RLE pain 10/10 therefore any additional standing deferred. Pt transferred to supine with modA and participated in supine therex for BLE strengthening. Pt performed AA heel slides x 10 bilaterally, SAQ 2 x 10, bridges 2 x 10. Pt indicated need for urinary void once returning to room. PTA discussed what type of toileting pt will be doing once at home with pt explaining use of BSC. When asked what she was doing here pt explained use of bed pan. PTA then encouraged pt to use [facility] with assistance form PTA. Pt eventually agreeable to attempt. Pt performed supine to sit with modA and performed Slide board transfer to w/c with CGA and verbal cues. Pt transported back to room for time management. Pt then performed Slide board transfer to bed in same manner as prior and then proceeded to perform transfer to [facility] with minA and verbal cues for sequencing. Pt was able to perform lateral leans at [facility] while PTA provided total A for clothing management. Pt with + urinary void and BM. Pt required  total A for  peri-care with pt having second BM after PTA was completed. Once pt fully cleaned, pt performed lateral leans to have PTA pull pants up however PTA was unable to pull pants over hips. PTA then placed towel on Slide board and pt was able to performed Slide board transfer with CGA to bed. Pt required modA for sit to supine and then pt performed rolling L/R with use of bed rail and supervision to allow PTA to pull pants over hips. Pt left in bed at end of session with lunch tray set up, call bell within reach and needs met.  ?   ? ?Therapy Documentation ?Precautions:  ?Precautions ?Precautions: Fall ?Restrictions ?Weight Bearing Restrictions: No ?General: ?  ?Vital Signs: ?Therapy Vitals ?Temp: 98.4 ?F (36.9 ?C) ?Pulse Rate: 85 ?Resp: 17 ?BP: 133/76 ?Patient Position (if appropriate): Lying ?Oxygen Therapy ?SpO2: 99 % ?O2 Device: Room Air ?Pain: ?Pain Assessment ?Pain Scale: 0-10 ?Pain Score: 10-Worst pain ever ?Pain Type: Acute pain ?Pain Location: Back ?Pain Descriptors / Indicators: Aching ?Pain Frequency: Constant ?Pain Intervention(s): Medication (See eMAR) ?Mobility: ?  ?Locomotion : ?   ?Trunk/Postural Assessment : ?   ?Balance: ?  ?Exercises: ?  ?Other Treatments:   ? ? ? ?Therapy/Group: Individual Therapy ? ?Talayah Picardi ?12/22/2021, 4:18 PM  ?

## 2021-12-22 NOTE — Progress Notes (Signed)
Occupational Therapy Session Note ? ?Patient Details  ?Name: Brandi [name] ?MRN: [id number] ?Date of Birth: [birth year] ? ?Today's Date: 12/22/2021 ?OT Individual Time: 1100-3496 ?OT Individual Time Calculation (min): 57 min  ? ? ?Short Term Goals: ?Week 1:  OT Short Term Goal 1 (Week 1): Pt will don UB clothing with CGA ?OT Short Term Goal 1 - Progress (Week 1): Progressing toward goal ?OT Short Term Goal 2 (Week 1): Pt will complete sit > stand with max A of 1 using LRAD ?OT Short Term Goal 2 - Progress (Week 1): Progressing toward goal ?OT Short Term Goal 3 (Week 1): Pt will complete bed mobility to EOB with CGA ?OT Short Term Goal 3 - Progress (Week 1): Progressing toward goal ?Week 2:  OT Short Term Goal 1 (Week 2): Pt will don UB clothing with CGA ?OT Short Term Goal 1 - Progress (Week 2): Met ?OT Short Term Goal 2 (Week 2): Pt will complete sit > stand with max A of 1 using LRAD ?OT Short Term Goal 2 - Progress (Week 2): Discontinued (comment) ?OT Short Term Goal 3 (Week 2): Pt will complete bed mobility to EOB with CGA ?OT Short Term Goal 3 - Progress (Week 2): Progressing toward goal ?OT Short Term Goal 4 (Week 2): Pt will complete SB DABSC transfer with mod A+1 ?OT Short Term Goal 5 (Week 2): Pt will complete LB dressing tasks with max A at bed level ?Week 3:  OT Short Term Goal 1 (Week 3): Pt will complete SB DABSC transfer with mod A+1 ?OT Short Term Goal 1 - Progress (Week 3): Met ?OT Short Term Goal 2 (Week 3): Pt will complete bed mobility to EOB with CGA ?OT Short Term Goal 2 - Progress (Week 3): Progressing toward goal ?OT Short Term Goal 3 (Week 3): Pt will complete LB dressing tasks with max A at bed level ?OT Short Term Goal 3 - Progress (Week 3): Met ?Week 4:  OT Short Term Goal 1 (Week 4): STG=LTG 2/2 ELOS ? ?Skilled Therapeutic Interventions/Progress Updates:  ?Patient met lying supine in bed in agreement with OT treatment session. Patient reporting mild discomfort in buttocks. Repositioning  for pain management. Supine to EOB with light Min A at RLE but patient able to come to EOB with use of grab bar. Increased time/effort for completion of task. Slideboard transfer from EOB to wc >mat table > wc with CGA (with exception of 1 transfer requiring +2 assist; refer below). With slideboard transfer from mat table back to wc, near miss during removal of slideboard in which patient had posterior LOB requiring +2 for posterior scoot in wc to prevent fall. Total A for wc transport to and from rehab gym for time management. Session with focus on lateral and anterior/posterior scooting with cues for head/hip relationship and foot placement in prep for functional transfers and LB ADLs. Dynamic sitting balance with focus on anterior leaning while reaching for horseshoes before tossing onto target. Returned to supine at conclusion of session with use of slideboard. Call bell within reach, bed alarm activated and all needs met.  ? ?Therapy Documentation ?Precautions:  ?Precautions ?Precautions: Fall ?Restrictions ?Weight Bearing Restrictions: No ?General: ?  ?Therapy/Group: Individual Therapy ? ?Brandi [name] ?12/22/2021, 6:31 AM ?

## 2021-12-22 NOTE — Progress Notes (Addendum)
Patient ID: Brandi [name], female   DOB: 01-27-1956, 66 y.o.   MRN: [id number] ? ?HH referral accepted by Amy[facility]. She reported that OT can address aide care needs. Pt will have HHPT/OT.  ? ?SW spoke with [name] to share above information. SW discussed DME. Reports he intends to pay co-pay for DME today. SW informed him ambulance transport was arranged and will be with [facility] pick up on date of d/c at 10am.  ? ?*SW updated [name] on incontinence supplies and informed this will be a private purchase but can order. Declined at this time. Will explore other ways to get these items.  ? ?Loralee Pacas, MSW, LCSWA ?Office: [phone] ?Cell: [phone] ?Fax: [phone]  ?

## 2021-12-22 NOTE — Progress Notes (Signed)
PROGRESS NOTE   Subjective/Complaints:  Explained to pt cannot cover incontinence supplies due to lack of coverage by insurance.   No other issues  ROS:    Pt denies SOB, abd pain, CP, N/V/C/D, and vision changes      Objective:   No results found. No results for input(s): WBC, HGB, HCT, PLT in the last 72 hours.    No results for input(s): NA, K, CL, CO2, GLUCOSE, BUN, CREATININE, CALCIUM in the last 72 hours.    Intake/Output Summary (Last 24 hours) at 12/22/2021 0758 Last data filed at 12/22/2021 0719 Gross per 24 hour  Intake 120 ml  Output --  Net 120 ml       Physical Exam: Vital Signs Blood pressure 118/73, pulse 78, temperature 98.1 F (36.7 C), resp. rate 18, height '5\' 6"'$  (1.676 m), weight 121.5 kg, SpO2 93 %.        General: awake, alert, appropriate, sitting up in bed; NAD HENT: conjugate gaze; oropharynx moist CV: regular rate; no JVD Pulmonary: CTA B/L; no W/R/R- good air movement GI: soft, NT, ND, (+)BS Psychiatric: appropriate- less anxious Neurological: Ox3; aphasia Ext: no clubbing, cyanosis, or edema Psych: pleasant and cooperative  Extremities: No clubbing, cyanosis, or edema Neurologic: Alert,  mild word finding issues motor strength is /5 in bilateral deltoid, bicep, tricep, grip, 3- bilateral hip flexor, knee extensors, ankle dorsiflexor and plantar flexor Musc: right knee externally rotated with valgus deformity. Associated effusion. Tender with ROM Skin: abrasion on L buttock healed; has deeper abrasion on R lateral ankle above lateral malleolus- ~77m deep.   Assessment/Plan: 1. Functional deficits which require 3+ hours per day of interdisciplinary therapy in a comprehensive inpatient rehab setting. Physiatrist is providing close team supervision and 24 hour management of active medical problems listed below. Physiatrist and rehab team continue to assess barriers to  discharge/monitor patient progress toward functional and medical goals  Care Tool:  Bathing    Body parts bathed by patient: Right arm, Left arm, Chest, Abdomen, Front perineal area, Right upper leg, Left upper leg, Face   Body parts bathed by helper: Buttocks, Right lower leg, Left lower leg     Bathing assist Assist Level: Moderate Assistance - Patient 50 - 74%     Upper Body Dressing/Undressing Upper body dressing   What is the patient wearing?: Pull over shirt    Upper body assist Assist Level: Supervision/Verbal cueing    Lower Body Dressing/Undressing Lower body dressing      What is the patient wearing?: Incontinence brief, Pants     Lower body assist Assist for lower body dressing: Maximal Assistance - Patient 25 - 49%     Toileting Toileting Toileting Activity did not occur (Clothing management and hygiene only): N/A (no void or bm)  Toileting assist Assist for toileting: Total Assistance - Patient < 25%     Transfers Chair/bed transfer  Transfers assist     Chair/bed transfer assist level: Minimal Assistance - Patient > 75% Chair/bed transfer assistive device: Sliding board   Locomotion Ambulation   Ambulation assist   Ambulation activity did not occur: Safety/medical concerns  Walk 10 feet activity   Assist  Walk 10 feet activity did not occur: Safety/medical concerns        Walk 50 feet activity   Assist Walk 50 feet with 2 turns activity did not occur: Safety/medical concerns         Walk 150 feet activity   Assist Walk 150 feet activity did not occur: Safety/medical concerns         Walk 10 feet on uneven surface  activity   Assist Walk 10 feet on uneven surfaces activity did not occur: Safety/medical concerns         Wheelchair     Assist Is the patient using a wheelchair?: Yes Type of Wheelchair: Manual    Wheelchair assist level: Supervision/Verbal cueing Max wheelchair distance: 100'     Wheelchair 50 feet with 2 turns activity    Assist        Assist Level: Supervision/Verbal cueing   Wheelchair 150 feet activity     Assist      Assist Level: Supervision/Verbal cueing   Blood pressure 118/73, pulse 78, temperature 98.1 F (36.7 C), resp. rate 18, height '5\' 6"'$  (1.676 m), weight 121.5 kg, SpO2 93 %.  Medical Problem List and Plan: 1. 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 - Decadron taper             -patient may shower             -ELOS/Goals: 10-12 days, supervision goals with PT and OT   [facility] says Surgical intervention is possible- will likely need to check with family?  2/28- surgeon seeing pt/son this AM- con't CIR- PT and OT   d/c 3/14  Con't CIR- PT, OT and SLP 2.  Antithrombotics: -DVT/anticoagulation:  Pharmaceutical: Heparin.               -antiplatelet therapy: Aspirin 325 mg daily 3. 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 might benefit from OA brace to help stabilize knee for mobility although it appears she's still pre-gait at this point   3/8- pt not walking- to bring in braces from home she already has.  4. Mood: Trazodone 150 mg nightly, Xanax 0.25 mg twice daily as needed 2/28- d/w pt again that has to ask for meds- also let son know              -antipsychotic agents: N/A 5. Neuropsych: This patient is capable of making decisions on her own behalf. 6. Skin/Wound Care: Routine skin checks             -encourage appropriate nutrition              -pressure relief 7. Fluids/Electrolytes/Nutrition:  .               -added protein supp for low albumin 8.  Diabetes mellitus.  Hemoglobin A1c 6.0.                CBG (last 3)       3/10- CBGs well controlled -con't regimen 9.  Hypertension.  Lopressor 25 mg twice daily, hydralazine 25 mg every 8 hours.      Off  all BP meds  3/9- BP controlled off meds- con't regimen Vitals:   12/21/21 1957 12/22/21 0349  BP: 128/72 118/73  Pulse:  86 78  Resp: 18 18  Temp: 98.4 F (36.9 C) 98.1 F (36.7 C)  SpO2: 99% 93%    10.  Hyperlipidemia.  Zocor 11.  Microcytic anemia.  Continue iron supplement.  Follow-up CBC 12.  Acute urinary retention/neurogenic bladder.  Foley tube removed.   -Presently on Flomax 0.4 mg.   -need pvr's  2/21- last cathed 2/19 early AM- but bladder scans <150 cc since then- off PVRs right now 13.  Constipation   -Senokot twice daily, MiraLAX daily Changed Senna to 2 tabs BID She notes improvement in bowel movements and states she did not take the laxatives yesterday 2/27- reduced laxatives to 3 pills/day. Since was having loose stools- 3 Bms yesterdays.  2/28- Reduced senna to 1 tab/day since on pain meds- but should reduce such frequent BM's.  3/1- still having BM's frequently- con't regimen for now- will wait to drop meds again. 3/2- have decreased frequency and urgency- con't regimen  3/3- will increase Senna to 2 tabs daily and add sorbitol prn if needs it.  3/10- LBM x2 yesterday- con't regimen 14.  Obesity.  BMI 39.5.  Dietary follow-up 15.  History of CVA.  Modified independent with straight point cane prior to admission.  Continue aspirin 16.  Leukocytosis.  Suspect steroid-induced--improved to 9.6 2/17  2/20- WBC down to 7.8k- con't regimen  2/28- WBC down to 3.3- will monitor 17. AKI  2/20- Cr up to 1.56 and BUN up to 31- will recheck Wednesday and push fluids- hopefully will help BP and Cr.   2/24- BUN still 29 and Cr 1.37- will give IVFs since pushing lfuids isn't working- and recheck in AM- IVFs NS 75 cc/hour x 24 hours  Creatinine 1.29 on 2/25, improving  2/27- Cr/labs pending this AM- will monitor  2/28- Cr 1.25- BUN 19- not dry anymore- con't to monitor  3/6- Cr 1.20- doing much better- and BUN 16-  18. Abrasion L buttock and R ankle  2/22- doesn' thave  pressure ulcer- per my exam nor WOC- will con't wound care. 3/2- will double check with nursing, but WOC said was abrasion.    3/8- L buttock healed; R ankle from brace?- healing- ~ 12m deep 19.  Hyponatremia  Sodium 133 on 2/25-stable  2/28- Na 135-  3/9- Na 136-     LOS: 22 days A FACE TO FACE EVALUATION WAS PERFORMED  Daivion Pape 12/22/2021, 7:58 AM

## 2021-12-23 LAB — GLUCOSE, CAPILLARY
Glucose-Capillary: 108 mg/dL — ABNORMAL HIGH (ref 70–99)
Glucose-Capillary: 87 mg/dL (ref 70–99)
Glucose-Capillary: 97 mg/dL (ref 70–99)
Glucose-Capillary: 119 mg/dL — ABNORMAL HIGH (ref 70–99)

## 2021-12-24 DIAGNOSIS — G959 Disease of spinal cord, unspecified: Secondary | ICD-10-CM | POA: Diagnosis not present

## 2021-12-24 LAB — GLUCOSE, CAPILLARY
Glucose-Capillary: 111 mg/dL — ABNORMAL HIGH (ref 70–99)
Glucose-Capillary: 110 mg/dL — ABNORMAL HIGH (ref 70–99)
Glucose-Capillary: 112 mg/dL — ABNORMAL HIGH (ref 70–99)
Glucose-Capillary: 107 mg/dL — ABNORMAL HIGH (ref 70–99)

## 2021-12-24 MED ORDER — SENNA 8.6 MG PO TABS
1.0000 | ORAL_TABLET | Freq: Every day | ORAL | Status: DC
  Administered 2021-12-25: 8.6 mg via ORAL
  Filled 2021-12-24 (×2): qty 1

## 2021-12-24 NOTE — Progress Notes (Signed)
PROGRESS NOTE   Subjective/Complaints:  Pt reports LBM x3 yesterday- very loose- No other issues.  Will stop Miralax and make prn.   ROS:   Pt denies SOB, abd pain, CP, N/V/C/D, and vision changes       Objective:   No results found. No results for input(s): WBC, HGB, HCT, PLT in the last 72 hours.    No results for input(s): NA, K, CL, CO2, GLUCOSE, BUN, CREATININE, CALCIUM in the last 72 hours.    Intake/Output Summary (Last 24 hours) at 12/24/2021 1403 Last data filed at 12/24/2021 1318 Gross per 24 hour  Intake 480 ml  Output --  Net 480 ml       Physical Exam: Vital Signs Blood pressure 115/62, pulse 96, temperature 98.9 F (37.2 C), temperature source Oral, resp. rate 16, height '5\' 6"'$  (1.676 m), weight 121.5 kg, SpO2 97 %.         General: awake, alert, appropriate, sitting upp in bed; finished breakfast; NAD HENT: conjugate gaze; oropharynx moist CV: regular rate; no JVD Pulmonary: CTA B/L; no W/R/R- good air movement GI: soft, NT, ND, (+)BS Psychiatric: appropriate Neurological: word finding issues stable- alert Ext: no clubbing, cyanosis, or edema Psych: pleasant and cooperative  Extremities: No clubbing, cyanosis, or edema Neurologic: Alert,  mild word finding issues motor strength is /5 in bilateral deltoid, bicep, tricep, grip, 3- bilateral hip flexor, knee extensors, ankle dorsiflexor and plantar flexor Musc: right knee externally rotated with valgus deformity. Associated effusion. Tender with ROM Skin: abrasion on L buttock healed; has deeper abrasion on R lateral ankle above lateral malleolus- ~98m deep.   Assessment/Plan: 1. Functional deficits which require 3+ hours per day of interdisciplinary therapy in a comprehensive inpatient rehab setting. Physiatrist is providing close team supervision and 24 hour management of active medical problems listed below. Physiatrist and rehab  team continue to assess barriers to discharge/monitor patient progress toward functional and medical goals  Care Tool:  Bathing    Body parts bathed by patient: Right arm, Left arm, Chest, Abdomen, Front perineal area, Right upper leg, Left upper leg, Face   Body parts bathed by helper: Buttocks, Right lower leg, Left lower leg     Bathing assist Assist Level: Moderate Assistance - Patient 50 - 74%     Upper Body Dressing/Undressing Upper body dressing   What is the patient wearing?: Pull over shirt    Upper body assist Assist Level: Supervision/Verbal cueing    Lower Body Dressing/Undressing Lower body dressing      What is the patient wearing?: Incontinence brief, Pants     Lower body assist Assist for lower body dressing: Maximal Assistance - Patient 25 - 49%     Toileting Toileting Toileting Activity did not occur (Clothing management and hygiene only): N/A (no void or bm)  Toileting assist Assist for toileting: Total Assistance - Patient < 25%     Transfers Chair/bed transfer  Transfers assist     Chair/bed transfer assist level: Minimal Assistance - Patient > 75% Chair/bed transfer assistive device: Sliding board   Locomotion Ambulation   Ambulation assist   Ambulation activity did not occur: Safety/medical concerns  Walk 10 feet activity   Assist  Walk 10 feet activity did not occur: Safety/medical concerns        Walk 50 feet activity   Assist Walk 50 feet with 2 turns activity did not occur: Safety/medical concerns         Walk 150 feet activity   Assist Walk 150 feet activity did not occur: Safety/medical concerns         Walk 10 feet on uneven surface  activity   Assist Walk 10 feet on uneven surfaces activity did not occur: Safety/medical concerns         Wheelchair     Assist Is the patient using a wheelchair?: Yes Type of Wheelchair: Manual    Wheelchair assist level: Supervision/Verbal  cueing Max wheelchair distance: 100'    Wheelchair 50 feet with 2 turns activity    Assist        Assist Level: Supervision/Verbal cueing   Wheelchair 150 feet activity     Assist      Assist Level: Supervision/Verbal cueing   Blood pressure 115/62, pulse 96, temperature 98.9 F (37.2 C), temperature source Oral, resp. rate 16, height '5\' 6"'$  (1.676 m), weight 121.5 kg, SpO2 97 %.  Medical Problem List and Plan: 1. 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 - Decadron taper             -patient may shower             -ELOS/Goals: 10-12 days, supervision goals with PT and OT   [facility] says Surgical intervention is possible- will likely need to check with family?  2/28- surgeon seeing pt/son this AM- con't CIR- PT and OT   d/c 3/14  Con't PT, OT and SLP- d/c Tuesday 2.  Antithrombotics: -DVT/anticoagulation:  Pharmaceutical: Heparin.               -antiplatelet therapy: Aspirin 325 mg daily 3. 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 might benefit from OA brace to help stabilize knee for mobility although it appears she's still pre-gait at this point   3/8- pt not walking- to bring in braces from home she already has.  3/12- says she has a doctor who prescribes pain meds for her outside hospital.  4. Mood: Trazodone 150 mg nightly, Xanax 0.25 mg twice daily as needed 2/28- d/w pt again that has to ask for meds- also let son know              -antipsychotic agents: N/A 5. Neuropsych: This patient is capable of making decisions on her own behalf. 6. Skin/Wound Care: Routine skin checks             -encourage appropriate nutrition              -pressure relief 7. Fluids/Electrolytes/Nutrition:  .               -added protein supp for low albumin 8.  Diabetes mellitus.  Hemoglobin A1c 6.0.                CBG  (last 3)       3/12- BG's controlled- con't regimen 9.  Hypertension.  Lopressor 25 mg twice daily, hydralazine 25 mg every 8 hours.      Off all BP meds  3/12- BP  controlled off meds- con't regimen Vitals:   12/24/21 0417 12/24/21 1318  BP: (!) 110/43 115/62  Pulse: 88 96  Resp: 14 16  Temp: 98.2 F (36.8 C) 98.9 F (37.2 C)  SpO2: 97% 97%    10.  Hyperlipidemia.  Zocor 11.  Microcytic anemia.  Continue iron supplement.  Follow-up CBC 12.  Acute urinary retention/neurogenic bladder.  Foley tube removed.   -Presently on Flomax 0.4 mg.   -need pvr's  2/21- last cathed 2/19 early AM- but bladder scans <150 cc since then- off PVRs right now 13.  Constipation   -Senokot twice daily, MiraLAX daily Changed Senna to 2 tabs BID She notes improvement in bowel movements and states she did not take the laxatives yesterday 2/27- reduced laxatives to 3 pills/day. Since was having loose stools- 3 Bms yesterdays.  2/28- Reduced senna to 1 tab/day since on pain meds- but should reduce such frequent BM's.  3/1- still having BM's frequently- con't regimen for now- will wait to drop meds again. 3/2- have decreased frequency and urgency- con't regimen  3/3- will increase Senna to 2 tabs daily and add sorbitol prn if needs it.  3/10- LBM x2 yesterday- con't regimen 14.  Obesity.  BMI 39.5.  Dietary follow-up 15.  History of CVA.  Modified independent with straight point cane prior to admission.  Continue aspirin 16.  Leukocytosis.  Suspect steroid-induced--improved to 9.6 2/17  2/20- WBC down to 7.8k- con't regimen  2/28- WBC down to 3.3- will monitor 17. AKI  2/20- Cr up to 1.56 and BUN up to 31- will recheck Wednesday and push fluids- hopefully will help BP and Cr.   2/24- BUN still 29 and Cr 1.37- will give IVFs since pushing lfuids isn't working- and recheck in AM- IVFs NS 75 cc/hour x 24 hours  Creatinine 1.29 on 2/25, improving  2/27- Cr/labs pending this AM- will monitor  2/28- Cr 1.25-  BUN 19- not dry anymore- con't to monitor  3/6- Cr 1.20- doing much better- and BUN 16-   3/12- labs in AM 18. Abrasion L buttock and R ankle  2/22- doesn' thave pressure ulcer- per my exam nor WOC- will con't wound care. 3/2- will double check with nursing, but WOC said was abrasion.    3/8- L buttock healed; R ankle from brace?- healing- ~ 4m deep 19.  Hyponatremia  Sodium 133 on 2/25-stable  2/28- Na 135-  3/9- Na 136-     LOS: 24 days A FACE TO FACE EVALUATION WAS PERFORMED  Brandi [name] 12/24/2021, 2:03 PM

## 2021-12-24 NOTE — Progress Notes (Signed)
Occupational Therapy Session Note ? ?Patient Details  ?Name: Brandi [name] ?MRN: [id number] ?Date of Birth: April 21, 1956 ? ?Today's Date: 12/24/2021 ?OT Individual Time: 0737-1062 ?OT Individual Time Calculation (min): 60 min  ? ? ?Short Term Goals: ?Week 4:  OT Short Term Goal 1 (Week 4): STG=LTG 2/2 ELOS ? ?Skilled Therapeutic Interventions/Progress Updates:  ?  Pt resting in bed upon arrival. Pt requested that her brief be changed. Rolling R/L in bed with min A using bed rails. Dependent for donning brief. Max A for donning pants at bed level. Supne>sit EOB with min A for RLE mgmt. Sidelying>sit EOB with supervision. Sitting balance with supervision. SB tranfser to w/c with CGA. Pt completed UB bathing/dressing with supervision seated in w/c at sink. Pt remained seated in w/c with all needs within reach and belt alarm activated.  ? ?Therapy Documentation ?Precautions:  ?Precautions ?Precautions: Fall ?Restrictions ?Weight Bearing Restrictions: No ? ?Pain: ?Pt reports that Bil knees are painful this morning; repositioned and emotional support ? ? ?Therapy/Group: Individual Therapy ? ?[name] ?12/24/2021, 11:19 AM ?

## 2021-12-24 NOTE — Progress Notes (Signed)
Physical Therapy Session Note ? ?Patient Details  ?Name: Brandi [name] ?MRN: [id number] ?Date of Birth: Mar 22, 1956 ? ?Today's Date: 12/24/2021 ?PT Individual Time: 3149-7026 ?PT Individual Time Calculation (min): 45 min  ? ?Short Term Goals: ?Week 3:  PT Short Term Goal 1 (Week 3): =LTG due to ELOS ? ? ?Skilled Therapeutic Interventions/Progress Updates:  ?Patient presented lying supine in bed using bedpan while nurse was still present. Pt was agreeable to therapy so PT was able to assist patient with clean up. Pt was able to perform rolling supine to side-lying in bed with mod A for changing briefs and donning pants. Then, performed supine to sitting on edge of bed with min A to assist the RLE which she reports is still causing significant pain. Pt requested pain medication if possible. Pt then performed slide board transfer with max A to get to wheelchair. Pt propelled W/C to therapy gym using BUE for 190f. She was agreeable to performing slide board transfers to the mat table with max A and verbal cuing for head-to-hips relationship to help with unweighting hips. Pt reported some increased difficulty due to feet slipping on the floor with non-slip socks donned. PT obtained Dysom to place under pt's feet to prevent slipping during transfers. Pt then reports that RLE pain was particularly limiting her this date due to the weather and cold temperatures. Pt performed a slide board transfer back into W/C with max A and was transported back to her room due to time. Pt required mod A to transfer back into bed from sitting>L side lying and was left in this position with all needs in reach and alarm active. Following treatment session, PT spoke with nursing about the pt requesting pain medication. ? Therapy Documentation ?Precautions:  ?Precautions ?Precautions: Fall ?Restrictions ?Weight Bearing Restrictions: No ?  ? ? ?Therapy/Group: Individual Therapy ? ?RLanetta InchSPT ? ?12/24/2021, 5:58 PM  ?

## 2021-12-24 NOTE — Discharge Summary (Incomplete)
Physician Discharge Summary  Patient ID: Brandi [name] MRN: [id number] DOB/AGE: 1956/03/08 66 y.o.  Admit date: 11/30/2021 Discharge date: 12/26/2021  Discharge Diagnoses:  Principal Problem:   Myelopathy ([place]) Active Problems:   Dyslipidemia   History of hypertension   Controlled type 2 diabetes mellitus with hyperglycemia, without long-term current use of insulin (HCC)   AKI (acute kidney injury) ([place])   Pressure injury of skin Mood stabilization DVT prophylaxis Microcytic anemia Acute urinary retention/neurogenic bladder Constipation Obesity AKI  Discharged Condition: Stable  Significant Diagnostic Studies: VAS [place] LOWER EXTREMITY VENOUS (DVT)  Result Date: 12/01/2021  Lower Venous DVT Study Patient Name:  Brandi [name]  Date of Exam:   12/01/2021 Medical Rec #: [id number]          Accession #:    [id number] Date of Birth: 08/27/1956          Patient Gender: F Patient Age:   59 years Exam Location:  [facility] Procedure:      VAS [place] LOWER EXTREMITY VENOUS (DVT) Referring Phys: Lauraine Rinne --------------------------------------------------------------------------------  Indications: Swelling, and Edema.  Comparison Study: no prior Performing Technologist: Archie Patten RVS  Examination Guidelines: A complete evaluation includes B-mode imaging, spectral Doppler, color Doppler, and power Doppler as needed of all accessible portions of each vessel. Bilateral testing is considered an integral part of a complete examination. Limited examinations for reoccurring indications may be performed as noted. The reflux portion of the exam is performed with the patient in reverse Trendelenburg.  +---------+---------------+---------+-----------+----------+--------------+  RIGHT     Compressibility Phasicity Spontaneity Properties Thrombus Aging  +---------+---------------+---------+-----------+----------+--------------+  CFV       Full            Yes       Yes                                     +---------+---------------+---------+-----------+----------+--------------+  SFJ       Full                                                             +---------+---------------+---------+-----------+----------+--------------+  FV Prox   Full                                                             +---------+---------------+---------+-----------+----------+--------------+  FV Mid    Full                                                             +---------+---------------+---------+-----------+----------+--------------+  FV Distal Full                                                             +---------+---------------+---------+-----------+----------+--------------+  PFV       Full                                                             +---------+---------------+---------+-----------+----------+--------------+  POP       Full            Yes       Yes                                    +---------+---------------+---------+-----------+----------+--------------+  PTV       Full                                                             +---------+---------------+---------+-----------+----------+--------------+  PERO      Full                                                             +---------+---------------+---------+-----------+----------+--------------+   +---------+---------------+---------+-----------+----------+--------------+  LEFT      Compressibility Phasicity Spontaneity Properties Thrombus Aging  +---------+---------------+---------+-----------+----------+--------------+  CFV       Full            Yes       Yes                                    +---------+---------------+---------+-----------+----------+--------------+  SFJ       Full                                                             +---------+---------------+---------+-----------+----------+--------------+  FV Prox   Full                                                              +---------+---------------+---------+-----------+----------+--------------+  FV Mid    Full                                                             +---------+---------------+---------+-----------+----------+--------------+  FV Distal Full                                                             +---------+---------------+---------+-----------+----------+--------------+  PFV       Full                                                             +---------+---------------+---------+-----------+----------+--------------+  POP       Full            Yes       Yes                                    +---------+---------------+---------+-----------+----------+--------------+  PTV       Full                                                             +---------+---------------+---------+-----------+----------+--------------+  PERO      Full                                                             +---------+---------------+---------+-----------+----------+--------------+     Summary: BILATERAL: - No evidence of deep vein thrombosis seen in the lower extremities, bilaterally. -No evidence of popliteal cyst, bilaterally.   *See table(s) above for measurements and observations. Electronically signed by Orlie Pollen on 12/01/2021 at 6:40:43 PM.    Final     Labs:  Basic Metabolic Panel: Recent Labs  Lab 12/18/21 0508  NA 136  K 4.3  CL 103  CO2 25  GLUCOSE 89  BUN 16  CREATININE 1.20*  CALCIUM 9.6    CBC: Recent Labs  Lab 12/18/21 0508  WBC 4.2  NEUTROABS 1.6*  HGB 12.0  HCT 38.6  MCV 75.4*  PLT 240    CBG: Recent Labs  Lab 12/23/21 2053 12/24/21 0554 12/24/21 1155 12/24/21 1626 12/24/21 2201  GLUCAP 119* 111* 110* 112* 107*   Family history.  Father with myocardial infarction.  Mother with stomach ulcers.  Denies any colon cancer esophageal cancer or rectal cancer  Brief HPI:   Brandi [name] is a 66 y.o. right-handed female with history of microcytic anemia, diabetes  mellitus hypertension history of CVA maintained on aspirin hyperlipidemia quit smoking 10 years ago.  Per chart review lives with her children.  1 level home 10 steps to entry.  Independent with a straight cane.  Presented 11/20/2021 with lower extremity weakness progressive paraplegia to [facility] as well as tingling left foot x2 weeks with gait abnormality.  CT/MRI of the brain chronic left temporoparietal encephalomalacia no evidence of acute abnormality.  MRA of the head negative.  MRI cervical spine lumbar spine showed widespread spondylosis with canal and foraminal stenosis.  MRI thoracic spine showed T3-4 T4-5 disc bulge with osteophytic ridging with moderate to severe canal stenosis.  Neurosurgical neurology follow-up no surgical intervention placed on steroid taper.  Scans reviewed by neurosurgery for etiology of her weakness and did not believe it was from cervical disease and  she had no upper extremity involvement.  Suspect myelopathy with upper thoracic sensory level.  It was felt that transverse myelitis relatively unlikely.  Patient was cleared to begin subcutaneous heparin for DVT prophylaxis.  Bouts of urinary retention Foley tube removed currently maintained on Flomax.  Chronic anemia hemoglobin 9.5 and monitored.  Therapy evaluations completed due to patient decreased functional mobility was admitted for a comprehensive rehab program.   Hospital Course: Brandi [name] was admitted to rehab 11/30/2021 for inpatient therapies to consist of PT, ST and OT at least three hours five days a week. Past admission physiatrist, therapy team and rehab RN have worked together to provide customized collaborative inpatient rehab.  Pertaining to patient's thoracic myelopathy she would follow-up outpatient neurosurgery.  Subcutaneous heparin for DVT prophylaxis with latest venous Doppler studies negative.  She also remained on full-strength aspirin for history of CVA.  Mood stabilization with  trazodone.  She was sleeping much better.  Blood sugars controlled hemoglobin A1c 6.0.  Blood pressure monitored remained somewhat soft she had been on Lopressor and hydralazine prior to admission currently held due to soft blood pressure.  Zocor ongoing for hyperlipidemia.  History of chronic anemia no bleeding episodes maintain on iron supplement.  Pain managed with use of Voltaren gel as well as oxycodone as needed.  Bouts of constipation resolved with laxative assistance.  Urinary retention suspect neurogenic bladder Flomax is indicated she was doing better with her voiding program.  Obesity with BMI 39.5 dietary follow-up.   Blood pressures were monitored on TID basis and soft and monitored  Diabetes has been monitored with ac/hs CBG checks and SSI was use prn for tighter BS control.    Rehab course: During patient's stay in rehab weekly team conferences were held to monitor patient's progress, set goals and discuss barriers to discharge. At admission, patient required +2 physical assist sit to stand max assist sit to side-lying  Physical exam.  Blood pressure 101/61 pulse 48 temperature 97.7 respirations 18 oxygen saturations 97% room air Constitutional.  No acute distress HEENT Head.  Normocephalic and atraumatic Eyes.  Pupils round and reactive to light no discharge without nystagmus Neck.  Supple nontender no JVD without thyromegaly Cardiac regular rate rhythm any extra sounds or murmur heard Abdomen.  Soft nontender positive bowel sounds without rebound Respiratory effort normal no respiratory distress without wheeze Extremities.  No clubbing cyanosis or edema Neurologic.  Alert oriented x3 normal insight and awareness.  Upper extremity motor is 5/5.  Lower extremity 2/5 hip flexors hip extension knee extension 2+, ADF/APF 4 - to 4/5.  Right leg 1/2 grade weaker than left.  Stocking glove sensory loss in both ankle and feet.  He/She  has had improvement in activity tolerance, balance,  postural control as well as ability to compensate for deficits. He/She has had improvement in functional use RUE/LUE  and RLE/LLE as well as improvement in awareness sliding board transfers to wheelchair wheelchair mobility and safety awareness to increase independence with basic ADLs.  Supine to sit edge of bed with minimal assist for right lower extremity management.  Sliding board transfer to bariatric DA BSC and wheelchair with contact-guard assist +2 for safety.  Total assist for toileting task.  Patient able to perform lateral leans but dependent for hygiene and clothing management.  Patient would require a bariatric DABSC secondary to patient's body habitus.  With therapies perform slide board transfer to mat with contact-guard minimal cues.  Patient attempts to stand x2 from elevated surface  to [place].  Limited by pain and weakness however on second attempt was able to fully extend left lower extremity with facilitation from PTA.  Patient perform supine to sit with moderate assist performed sliding board transfer wheelchair contact-guard verbal cues for toilet transfers.  Full family teaching completed plan discharged to home       Disposition: Discharge to home    Diet: Diabetic diet  Special Instructions: No driving smoking or alcohol  Medications at discharge. 1.  Tylenol as needed 2.  Xanax 0.25 mg twice daily as needed 3.  Aspirin 325 mg p.o. daily 4.  Voltaren gel 2 g 4 times daily 5.  Ferrous sulfate 325 mg p.o. daily 6.  Oxycodone 5 mg every 4 hours as needed pain 7.  Protonix 40 mg p.o. daily 8.  Zocor 20 mg p.o. daily 9.  Flomax 0.4 mg p.o. daily 10.  Trazodone 150 mg p.o. nightly  30-35 minutes were spent completing discharge summary and discharge planning  Discharge Instructions     Ambulatory referral to Physical Medicine Rehab   Complete by: As directed    Moderate complexity follow-up 1 to 2 weeks thoracic myelopathy        Follow-up Information     [place],  Jinny Blossom, MD Follow up.   Specialty: Physical Medicine and Rehabilitation Why: Office to call for appointment Contact information: [address] [place] [address] [phone]         Eustace Moore, MD Follow up.   Specialty: Neurosurgery Why: Call for appointment Contact information: [address] [place] [address] [phone]                 Signed: Cathlyn Parsons 12/25/2021, 5:35 AM

## 2021-12-25 DIAGNOSIS — G959 Disease of spinal cord, unspecified: Secondary | ICD-10-CM | POA: Diagnosis not present

## 2021-12-25 LAB — CBC WITH DIFFERENTIAL/PLATELET
Abs Immature Granulocytes: 0.05 10*3/uL (ref 0.00–0.07)
Basophils Absolute: 0 10*3/uL (ref 0.0–0.1)
Basophils Relative: 0 %
Eosinophils Absolute: 0.1 10*3/uL (ref 0.0–0.5)
Eosinophils Relative: 1 %
HCT: 37.3 % (ref 36.0–46.0)
Hemoglobin: 12 g/dL (ref 12.0–15.0)
Immature Granulocytes: 1 %
Lymphocytes Relative: 25 %
Lymphs Abs: 1.7 10*3/uL (ref 0.7–4.0)
MCH: 24.7 pg — ABNORMAL LOW (ref 26.0–34.0)
MCHC: 32.2 g/dL (ref 30.0–36.0)
MCV: 76.9 fL — ABNORMAL LOW (ref 80.0–100.0)
Monocytes Absolute: 1 10*3/uL (ref 0.1–1.0)
Monocytes Relative: 14 %
Neutro Abs: 4.2 10*3/uL (ref 1.7–7.7)
Neutrophils Relative %: 59 %
Platelets: 263 10*3/uL (ref 150–400)
RBC: 4.85 MIL/uL (ref 3.87–5.11)
Smear Review: ADEQUATE
WBC: 7 10*3/uL (ref 4.0–10.5)
nRBC: 0 % (ref 0.0–0.2)

## 2021-12-25 LAB — COMPREHENSIVE METABOLIC PANEL
ALT: 15 U/L (ref 0–44)
AST: 11 U/L — ABNORMAL LOW (ref 15–41)
Albumin: 3 g/dL — ABNORMAL LOW (ref 3.5–5.0)
Alkaline Phosphatase: 45 U/L (ref 38–126)
Anion gap: 7 (ref 5–15)
BUN: 16 mg/dL (ref 8–23)
CO2: 25 mmol/L (ref 22–32)
Calcium: 9.4 mg/dL (ref 8.9–10.3)
Chloride: 104 mmol/L (ref 98–111)
Creatinine, Ser: 1 mg/dL (ref 0.44–1.00)
GFR, Estimated: >60 mL/min (ref >60)
Glucose, Bld: 112 mg/dL — ABNORMAL HIGH (ref 70–99)
Potassium: 3.9 mmol/L (ref 3.5–5.1)
Sodium: 136 mmol/L (ref 135–145)
Total Bilirubin: 0.6 mg/dL (ref 0.3–1.2)
Total Protein: 5.9 g/dL — ABNORMAL LOW (ref 6.5–8.1)

## 2021-12-25 LAB — GLUCOSE, CAPILLARY
Glucose-Capillary: 126 mg/dL — ABNORMAL HIGH (ref 70–99)
Glucose-Capillary: 121 mg/dL — ABNORMAL HIGH (ref 70–99)
Glucose-Capillary: 130 mg/dL — ABNORMAL HIGH (ref 70–99)
Glucose-Capillary: 98 mg/dL (ref 70–99)

## 2021-12-25 MED ORDER — OXYCODONE HCL 5 MG PO TABS: 5.0000 mg | ORAL_TABLET | ORAL | 0 refills | Status: AC | PRN

## 2021-12-25 MED ORDER — TAMSULOSIN HCL 0.4 MG PO CAPS: 0.4000 mg | ORAL_CAPSULE | Freq: Every day | ORAL | 0 refills | Status: AC

## 2021-12-25 MED ORDER — TRAZODONE HCL 150 MG PO TABS: 150.0000 mg | ORAL_TABLET | Freq: Every day | ORAL | 0 refills | Status: AC

## 2021-12-25 MED ORDER — ALPRAZOLAM 0.25 MG PO TABS
0.2500 mg | ORAL_TABLET | Freq: Two times a day (BID) | ORAL | 0 refills | Status: AC | PRN
Start: 2021-12-25 — End: ?

## 2021-12-25 MED ORDER — PANTOPRAZOLE SODIUM 40 MG PO TBEC: 40.0000 mg | DELAYED_RELEASE_TABLET | Freq: Every day | ORAL | 0 refills | Status: AC

## 2021-12-25 MED ORDER — DICLOFENAC SODIUM 1 % EX GEL: 2.0000 g | Freq: Four times a day (QID) | CUTANEOUS | 0 refills | Status: AC

## 2021-12-25 MED ORDER — FERROUS SULFATE 325 (65 FE) MG PO TABS: 325.0000 mg | ORAL_TABLET | Freq: Every day | ORAL | 3 refills | Status: AC

## 2021-12-25 MED ORDER — SIMVASTATIN 20 MG PO TABS: 20.0000 mg | ORAL_TABLET | Freq: Every day | ORAL | 0 refills | Status: AC

## 2021-12-25 MED ORDER — ACETAMINOPHEN 325 MG PO TABS
650.0000 mg | ORAL_TABLET | Freq: Four times a day (QID) | ORAL | Status: AC | PRN
Start: 2021-12-25 — End: ?

## 2021-12-25 NOTE — Progress Notes (Addendum)
Addendum: ?I have read and agree with the following note.  ?Kip Cropp BS, PharmD, BCPS ?Clinical Pharmacist ?12/25/2021 12:56 PM ? ? ?Inpatient Rehabilitation Discharge Medication Review by a Pharmacist ? ?A complete drug regimen review was completed for this patient to identify any potential clinically significant medication issues. ? ?High Risk Drug Classes Is patient taking? Indication by Medication  ?Antipsychotic No   ?Anticoagulant No   ?Antibiotic No   ?Opioid Yes OxyIR - pain associated with OA  ?Antiplatelet Yes ASA - CVA ppx  ?Hypoglycemics/insulin No   ?Vasoactive Medication Yes Flomax - acute urinary retention/neurogenic bladder  ?Chemotherapy No   ?Other Yes Zocor - HLD ?Xanax - mood ?Trazodone - sleep ?Protonix - GERD ?Ferrous sulfate - microcytic anemia ?Voltaren - pain associated with OA  ? ? ? ?Type of Medication Issue Identified Description of Issue Recommendation(s)  ?Drug Interaction(s) (clinically significant) ?    ?Duplicate Therapy ?    ?Allergy ?    ?No Medication Administration End Date ?    ?Incorrect Dose ?    ?Additional Drug Therapy Needed ?    ?Significant med changes from prior encounter (inform family/care partners about these prior to discharge).    ?Other ? PTA Meds: ?Albuterol inhaler  ?Hydralazine ?Lisinopril ?Lopressor Albuterol has not been ordered during stay and patient has not had a need for it.  ?BP medications from home D/C'd during CIR stay and patient has been well controlled off them.  ? ? ?Clinically significant medication issues were identified that warrant physician communication and completion of prescribed/recommended actions by midnight of the next day:  No ? ?Time spent performing this drug regimen review (minutes):  30 ? ? ?Brandi [name] ?12/25/2021 12:07 PM ?

## 2021-12-25 NOTE — Progress Notes (Signed)
Occupational Therapy Session Note ? ?Patient Details  ?Name: Brandi [name] ?MRN: [id number] ?Date of Birth: 09/14/1956 ? ?Today's Date: 12/25/2021 ?OT Individual Time: 6808-8110 ?OT Individual Time Calculation (min): 75 min  ? ? ?Short Term Goals: ?Week 4:  OT Short Term Goal 1 (Week 4): STG=LTG 2/2 ELOS ? ?Skilled Therapeutic Interventions/Progress Updates:  ?  Pt resting in bed upon arrival with NT assisting with donning incontinence brief. OTA and Rehab Tech completed task. OT intervention with focus on bahting at bed level and w/c level, LB dressing at bed level, UB dressing at w/c level, bed mobility, SB tranfsers, sitting balance, and w/c mobility to prepare for discharge home tomorrow. LB dressing with max A. Supine>sit EOB with min A for RLE mgmt off EOB. SB tranfser to w/c with min A. Pt also practiced DABSC transfer with min A. UB bathing/dressing with supervision w/c level at sink. W/c mobility with supervision with propulsion to gym and back to room. Pt remained in w/c with belt alarm activated. All needs within reach. Pt pleased with progress and ready for discharge home tomorrow. ? ?Therapy Documentation ?Precautions:  ?Precautions ?Precautions: Fall ?Restrictions ?Weight Bearing Restrictions: No ?  ?Pain: ?Pt c/o increase Bil knee pain R>L this morning; meds requested from [place] ? ? ?Therapy/Group: Individual Therapy ? ?[name] ?12/25/2021, 9:36 AM ?

## 2021-12-25 NOTE — Progress Notes (Signed)
Physical Therapy Discharge Summary ? ?Patient Details  ?Name: Brandi [name] ?MRN: [id number] ?Date of Birth: 01/10/1956 ? ?Patient has met 5 of 7 long term goals due to improved activity tolerance, improved balance, improved postural control, increased strength, decreased pain, and ability to compensate for deficits.  Patient to discharge at a wheelchair level [place].   Patient's care partner is independent to provide the necessary physical and cognitive assistance at discharge. Pt's son was her caregiver prior to admission and has completed hands-on family education. He is safe to assist pt with bed mobility, transfers, etc. upon d/c home. ? ?Reasons goals not met: Pt did not meet sit to stand goal of mod A as she continues to be dependent with this transfer due to ongoing LE weakness. Pt also did not meet bed mobility goal of Supervision as she continues to require min A and hospital bed features due to body habitus, poor motor planning, and global weakness. ? ?Recommendation:  ?Patient will benefit from ongoing skilled PT services in home health setting to continue to advance safe functional mobility, address ongoing impairments in endurance, strength, balance, safety, independence with functional mobility, and minimize fall risk. ? ?Equipment: ?20x18 w/c recommended, 30" slide board, hospital bed ? ?Reasons for discharge: treatment goals met and discharge from hospital ? ?Patient/family agrees with progress made and goals achieved: Yes ? ?PT Discharge ?Precautions/Restrictions ?Precautions ?Precautions: Fall ?Restrictions ?Weight Bearing Restrictions: No ?Pain Interference ?Pain Interference ?Pain Effect on Sleep: 2. Occasionally ?Pain Interference with Therapy Activities: 2. Occasionally ?Pain Interference with Day-to-Day Activities: 2. Occasionally ?Vision/Perception  ?Perception ?Perception: Within Functional Limits ?Praxis ?Praxis: Intact  ?Cognition ?Overall Cognitive Status: History of cognitive  impairments - at baseline ?Arousal/Alertness: Awake/alert ?Orientation Level: Oriented X4 ?Year: 2023 ?Attention: Selective ?Selective Attention: Appears intact ?Memory: Appears intact ?Awareness: Appears intact ?Problem Solving: Appears intact ?Safety/Judgment: Appears intact ?Sensation ?Sensation ?Light Touch: Impaired Detail ?Light Touch Impaired Details: Impaired RUE;Impaired RLE;Impaired LLE (distal>proximal) ?Proprioception: Impaired Detail ?Proprioception Impaired Details: Impaired RLE;Impaired LLE ?Coordination ?Gross Motor Movements are Fluid and Coordinated: No ?Fine Motor Movements are Fluid and Coordinated: No ?Coordination and Movement Description: Residual R hemi and BLE weakness ?Motor  ?Motor ?Motor: Hemiplegia;Other (comment);Paraplegia ?Motor - Skilled Clinical Observations: residual R weakness and BLE weakness with generalized deconditioning ?Motor - Discharge Observations: residual R weakness, BLE weakness, generalized deconditioning  ?Mobility ?Bed Mobility ?Bed Mobility: Rolling Right;Rolling Left;Supine to Sit;Sit to Supine ?Rolling Right: Minimal Assistance - Patient > 75% ?Rolling Left: Minimal Assistance - Patient > 75% ?Supine to Sit: Minimal Assistance - Patient > 75%;Moderate Assistance - Patient 50-74% ?Sit to Supine: Minimal Assistance - Patient > 75% ?Transfers ?Transfers: Sit to Stand;Lateral/Scoot Transfers ?Sit to Stand: Dependent - mechanical lift ?Lateral/Scoot Transfers: Minimal Assistance - Patient > 75% ?Transfer (Assistive device): Other (Comment) (slide board) ?Locomotion  ?Gait ?Ambulation: No ?Gait ?Gait: No ?Stairs / Additional Locomotion ?Stairs: No ?Pick up small object from the floor assist level: Dependent - Patient 0% ?Wheelchair Mobility ?Wheelchair Mobility: Yes ?Wheelchair Assistance: Supervision/Verbal cueing ?Wheelchair Propulsion: Both upper extremities ?Wheelchair Parts Management: Needs assistance ?Distance: 150  ?Trunk/Postural Assessment  ?Cervical  Assessment ?Cervical Assessment: Within Functional Limits ?Thoracic Assessment ?Thoracic Assessment: Exceptions to [facility] (rounded shoulders) ?Lumbar Assessment ?Lumbar Assessment: Exceptions to [facility] (posterior pelvic tilt) ?Postural Control ?Postural Control: Deficits on evaluation ?Righting Reactions: delayed ?Protective Responses: delayed  ?Balance ?Balance ?Balance Assessed: Yes ?Static Sitting Balance ?Static Sitting - Balance Support: No upper extremity supported;Feet supported ?Static Sitting - Level of Assistance: 6: Modified independent (  Device/Increase time) ?Dynamic Sitting Balance ?Dynamic Sitting - Balance Support: No upper extremity supported;Feet supported;During functional activity ?Dynamic Sitting - Level of Assistance: 4: Min assist ?Static Standing Balance ?Static Standing - Balance Support: Bilateral upper extremity supported;During functional activity ?Static Standing - Level of Assistance: 1: +2 Total assist ?Extremity Assessment   ?RLE Assessment ?RLE Assessment: Exceptions to [facility] ?General Strength Comments: impaired, see below ?RLE Strength ?Right Hip Flexion: 4/5 ?Right Knee Flexion: 3-/5 ?Right Knee Extension: 3-/5 ?Right Ankle Dorsiflexion: 3/5 ?LLE Assessment ?LLE Assessment: Exceptions to [facility] ?General Strength Comments: impaired, see below ?LLE Strength ?Left Hip Flexion: 4/5 ?Left Knee Flexion: 4/5 ?Left Knee Extension: 4/5 ?Left Ankle Dorsiflexion: 3/5 ? ? ? ? ?Excell Seltzer, PT, DPT, CSRS ?12/25/2021, 5:54 PM ?

## 2021-12-25 NOTE — Progress Notes (Signed)
Occupational Therapy Discharge Summary ? ?Patient Details  ?Name: Brandi [name] ?MRN: [id number] ?Date of Birth: 09-14-1956 ? ?Patient has met 3 of 5 long term goals due to improved activity tolerance, improved balance, postural control, and ability to compensate for deficits.  PT made steady progress with BADLs and functional transfers during this admission. Pt continues to require max A for LB dressing at bed level and tot A for toileting tasks. SB transfers for [facility] and TTB with min A. UB bathing/dressing with supervision at bed level. Pt's son has been present and participated in education and therapy. Patient to discharge at overall Mod Assist to Max A level.  Patient's care partner is independent to provide the necessary physical assistance at discharge.   ? ?Reasons goals not met: Patient continues to require Max A for LB dressing given continued paraplegia and decreased sitting balance deficits. Patient also continue to require Total A for toileting tasks including hygiene and clothing management.  ? ?Recommendation:  ?Patient will benefit from ongoing skilled OT services in home health setting to continue to advance functional skills in the area of BADL and Reduce care partner burden. ? ?Equipment: ?Bariatric DABSC, TTB ? ?Reasons for discharge: discharge from hospital ? ?Patient/family agrees with progress made and goals achieved: Yes ? ?OT Discharge ?ADL ?ADL ?Eating: Independent ?Where Assessed-Eating: Wheelchair ?Grooming: Supervision/safety ?Where Assessed-Grooming: Sitting at sink, Wheelchair ?Upper Body Bathing: Supervision/safety ?Where Assessed-Upper Body Bathing: Sitting at sink, Wheelchair ?Lower Body Bathing: Minimal assistance ?Where Assessed-Lower Body Bathing: Bed level ?Upper Body Dressing: Setup ?Where Assessed-Upper Body Dressing: Sitting at sink, Wheelchair ?Lower Body Dressing: Maximal assistance, Moderate cueing ?Where Assessed-Lower Body Dressing: Bed level ?Toileting: Maximal  assistance ?Where Assessed-Toileting: Bed level ?Toilet Transfer: Minimal assistance, Moderate verbal cueing ?Toilet Transfer Method: Transfer board ?Science writer: Extra wide drop arm bedside commode ?Tub/Shower Transfer: Not assessed ?Vision ?Baseline Vision/History: 0 No visual deficits ?Patient Visual Report: No change from baseline ?Vision Assessment?: No apparent visual deficits ?Perception  ?Perception: Within Functional Limits ?Praxis ?Praxis: Intact ?Cognition ?Overall Cognitive Status: History of cognitive impairments - at baseline ?Arousal/Alertness: Awake/alert ?Orientation Level: Oriented X4 ?Year: 2023 ?Month: March ?Day of Week: Correct ?Attention: Selective ?Selective Attention: Appears intact ?Memory: Appears intact ?Immediate Memory Recall: Sock;Blue;Bed ?Memory Recall Sock: Without Cue ?Memory Recall Blue: Without Cue ?Memory Recall Bed: Without Cue ?Awareness: Appears intact ?Problem Solving: Appears intact ?Safety/Judgment: Appears intact ?Comments: 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 Pt with slow processing and sequencing at times ?Sensation ?Sensation ?Light Touch: Impaired Detail ?Central sensation comments: Residual R diminished sensation ?Light Touch Impaired Details: Impaired RUE;Impaired RLE;Impaired LLE ?Hot/Cold: Appears Intact ?Proprioception: Appears Intact ?Stereognosis: Not tested ?Coordination ?Gross Motor Movements are Fluid and Coordinated: No ?Fine Motor Movements are Fluid and Coordinated: No ?Coordination and Movement Description: Residual R hemi and BLE weakness ?Finger Nose Finger Test: Coordination deficits on the RUE ?Motor  ?Motor ?Motor: Hemiplegia;Other (comment);Paraplegia ?Motor - Skilled Clinical Observations: residual R weakness and BLE weakness with generalized deconditioning ?Trunk/Postural Assessment  ?Cervical Assessment ?Cervical Assessment: Within Functional Limits ?Thoracic Assessment ?Thoracic Assessment: Exceptions to  [facility] (rounded shoulders) ?Lumbar Assessment ?Lumbar Assessment: Exceptions to [facility] (posterior pelvic tilt) ?Postural Control ?Postural Control: Deficits on evaluation ?Righting Reactions: delayed ?Protective Responses: delayed  ?Balance ?Static Sitting Balance ?Static Sitting - Level of Assistance: 6: Modified independent (Device/Increase time) ?Dynamic Sitting Balance ?Dynamic Sitting - Balance Support: During functional activity ?Dynamic Sitting - Level of Assistance: 4: Min assist ?Extremity/Trunk Assessment ?RUE Assessment ?RUE Assessment: Exceptions  to [facility] ?Active Range of Motion (AROM) Comments: Full AROM and normal strength, some residual coordination deficits and stiffness ?LUE Assessment ?LUE Assessment: Within Functional Limits ? ? ?[name] ?12/25/2021, 7:01 AM ?

## 2021-12-25 NOTE — Progress Notes (Signed)
Physical Therapy Session Note ? ?Patient Details  ?Name: Brandi [name] ?MRN: [id number] ?Date of Birth: 11-14-1955 ? ?Today's Date: 12/25/2021 ?PT Individual Time: 1300-1400 ?PT Individual Time Calculation (min): 60 min  ? ?Short Term Goals: ?Week 3:  PT Short Term Goal 1 (Week 3): =LTG due to ELOS ? ?Skilled Therapeutic Interventions/Progress Updates:  ?  Pt received seated in w/c in room, reporting urgent need to have a BM. Encouraged pt to transfer to [facility] via SB, she requests to return to bed and use bed pan and reports this is her plan for toileting at home. Slide board transfer w/c to bed with min A. Sit to supine mod A needed for BLE management. Rolling L/R with min A for placement of bedpan. Pt able to continently void while on bedpan following some incontinence of urine and stool in her brief. Pt is dependent for pericare, donning of new brief, and pulling pants up over hips. Pt reports her stomach remains upset following toileting, nursing notified. Supine to sit with min A needed for RLE management, use of bedrail and HOB elevated. Slide board transfer back to w/c with min A. Manual w/c propulsion x 150 ft with use of BUE at Supervision level. Provided pt with theraband to wrap around her own w/c rims at home for improved grip during propulsion. Pt left seated in w/c in room handed off to OT for next therapy session. ? ?Therapy Documentation ?Precautions:  ?Precautions ?Precautions: Fall ?Restrictions ?Weight Bearing Restrictions: No ? ? ? ? ? ? ?Therapy/Group: Individual Therapy ? ? ?Excell Seltzer, PT, DPT, CSRS ?12/25/2021, 5:17 PM  ?

## 2021-12-25 NOTE — Progress Notes (Signed)
Physical Therapy Session Note ? ?Patient Details  ?Name: Brandi [name] ?MRN: [id number] ?Date of Birth: Apr 11, 1956 ? ?Today's Date: 12/25/2021 ?PT Individual Time: 2563-8937 ?PT Individual Time Calculation (min): 30 min  ? ?Short Term Goals: ?Week 3:  PT Short Term Goal 1 (Week 3): =LTG due to ELOS ? ?Skilled Therapeutic Interventions/Progress Updates:  ?   ?Patient in w/c in the room upon PT arrival. Patient alert and agreeable to PT session. Patient denied pain during session. ? ?Therapeutic Activity: ?Transfers: Patient performed a simulated sedan height car transfer with min-mod A to enter the car and mod A with +2 for equipment management to prevent w/c and slide board form sliding during transfer using a slide board with total A for placement. Provided cues for hand placement, board placement, and head-hips relationship for proper technique and decreased assist with transfers.  ? ?Wheelchair Mobility:  ?Patient propelled wheelchair >150 feet with supervision. Provided verbal cues for turning technique and set-up for transfers. ? ?Patient in w/c in the room at end of session with breaks locked, seat belt alarm set, and all needs within reach.  ? ?Therapy Documentation ?Precautions:  ?Precautions ?Precautions: Fall ?Restrictions ?Weight Bearing Restrictions: No ? ? ? ?Therapy/Group: Individual Therapy ? ?Doreene Burke PT, DPT ? ?12/25/2021, 12:45 PM  ?

## 2021-12-25 NOTE — Progress Notes (Signed)
Occupational Therapy Session Note ? ?Patient Details  ?Name: Brandi [name] ?MRN: [id number] ?Date of Birth: [birth year] ? ?Today's Date: 12/25/2021 ?OT Individual Time: 1751-0258 ?OT Individual Time Calculation (min): 30 min  ? ? ?Short Term Goals: ?Week 1:  OT Short Term Goal 1 (Week 1): Pt will don UB clothing with CGA ?OT Short Term Goal 1 - Progress (Week 1): Progressing toward goal ?OT Short Term Goal 2 (Week 1): Pt will complete sit > stand with max A of 1 using LRAD ?OT Short Term Goal 2 - Progress (Week 1): Progressing toward goal ?OT Short Term Goal 3 (Week 1): Pt will complete bed mobility to EOB with CGA ?OT Short Term Goal 3 - Progress (Week 1): Progressing toward goal ?Week 2:  OT Short Term Goal 1 (Week 2): Pt will don UB clothing with CGA ?OT Short Term Goal 1 - Progress (Week 2): Met ?OT Short Term Goal 2 (Week 2): Pt will complete sit > stand with max A of 1 using LRAD ?OT Short Term Goal 2 - Progress (Week 2): Discontinued (comment) ?OT Short Term Goal 3 (Week 2): Pt will complete bed mobility to EOB with CGA ?OT Short Term Goal 3 - Progress (Week 2): Progressing toward goal ?OT Short Term Goal 4 (Week 2): Pt will complete SB DABSC transfer with mod A+1 ?OT Short Term Goal 5 (Week 2): Pt will complete LB dressing tasks with max A at bed level ?Week 3:  OT Short Term Goal 1 (Week 3): Pt will complete SB DABSC transfer with mod A+1 ?OT Short Term Goal 1 - Progress (Week 3): Met ?OT Short Term Goal 2 (Week 3): Pt will complete bed mobility to EOB with CGA ?OT Short Term Goal 2 - Progress (Week 3): Progressing toward goal ?OT Short Term Goal 3 (Week 3): Pt will complete LB dressing tasks with max A at bed level ?OT Short Term Goal 3 - Progress (Week 3): Met ?Week 4:  OT Short Term Goal 1 (Week 4): STG=LTG 2/2 ELOS ? ?Skilled Therapeutic Interventions/Progress Updates:  ?Patient met seated in wc in agreement with OT treatment session. Denies pain. Patient reports being excited about pending d/c tomorrow  reporting good family support from her son. Feels confident with her sons ability to assist her with ADLs and functional transfers with use of slideboard. Patient received several pairs of donated shoes this date. Total A required to doff/don footwear. Slideboard transfer from wc to EOB with CGA and return to supine with assist at BLE. Call bell within reach, bed alarm activated and all needs met.  ? ?Therapy Documentation ?Precautions:  ?Precautions ?Precautions: Fall ?Restrictions ?Weight Bearing Restrictions: No ?General: ?  ?Therapy/Group: Individual Therapy ? ?[name] ?12/25/2021, 12:16 PM ?

## 2021-12-25 NOTE — Plan of Care (Signed)
?  Problem: RH Dressing ?Goal: LTG Patient will perform lower body dressing w/assist (OT) ?Description: LTG: Patient will perform lower body dressing with assist, with/without cues in positioning using equipment (OT) ?Outcome: Not Met (add Reason) ?Note: Continues to require Max A grossly 2/2 continued deficits.  ?  ?Problem: RH Toileting ?Goal: LTG Patient will perform toileting task (3/3 steps) with assistance level (OT) ?Description: LTG: Patient will perform toileting task (3/3 steps) with MAX assistance level (OT)  ?Outcome: Not Met (add Reason) ?Note: Continues to require Total A 2/2 continued deficits.  ?  ?

## 2021-12-25 NOTE — Progress Notes (Signed)
Patient ID: Brandi [name], female   DOB: 03-17-1956, 66 y.o.   MRN: [id number] ? ?SW returned phone call to patient son Ronalee Belts who wanted to review pt d/c. Reports DME is scheduled to be delivered today. SW informed will confirm all DME going to the home since transportation is an issue. SW confirmed transportation pick with [facility] at 10am tomorrow.  ? ?Loralee Pacas, MSW, LCSWA ?Office: [phone] ?Cell: [phone] ?Fax: [phone]  ?

## 2021-12-25 NOTE — Progress Notes (Signed)
PROGRESS NOTE   Subjective/Complaints:  Pt reports bowels doing better again- not too loose.  Asking about when would get pain meds- explained will get 7 days at d/c- and will be sent to her pharmacy- [facility].     ROS: Pt denies SOB, abd pain, CP, N/V/C/D, and vision changes       Objective:   No results found. Recent Labs    12/25/21 0548  WBC 7.0  HGB 12.0  HCT 37.3  PLT 263      Recent Labs    12/25/21 0548  NA 136  K 3.9  CL 104  CO2 25  GLUCOSE 112*  BUN 16  CREATININE 1.00  CALCIUM 9.4      Intake/Output Summary (Last 24 hours) at 12/25/2021 0810 Last data filed at 12/25/2021 0739 Gross per 24 hour  Intake 720 ml  Output --  Net 720 ml       Physical Exam: Vital Signs Blood pressure 100/69, pulse 87, temperature 98.4 F (36.9 C), resp. rate 16, height '5\' 6"'$  (1.676 m), weight 121.5 kg, SpO2 94 %.          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 Associated effusion. Tender with ROM Skin: abrasion on L buttock healed; has deeper abrasion on R lateral ankle above lateral malleolus- ~26m deep.   Assessment/Plan: 1. Functional deficits which require 3+ hours per day of interdisciplinary therapy in a comprehensive inpatient rehab setting. Physiatrist is providing close team supervision and 24 hour  management of active medical problems listed below. Physiatrist and rehab team continue to assess barriers to discharge/monitor patient progress toward functional and medical goals  Care Tool:  Bathing    Body parts bathed by patient: Right arm, Left arm, Chest, Abdomen, Front perineal area, Right upper leg, Left upper leg, Face   Body parts bathed by helper: Buttocks, Right lower leg, Left lower leg     Bathing assist Assist Level: Moderate Assistance - Patient 50 - 74%     Upper Body Dressing/Undressing Upper body dressing   What is the patient wearing?: Pull over shirt    Upper body assist Assist Level: Supervision/Verbal cueing    Lower Body Dressing/Undressing Lower body dressing      What is the patient wearing?: Pants     Lower body assist Assist for lower body dressing: Maximal Assistance - Patient 25 - 49%     Toileting Toileting Toileting Activity did not occur (Clothing management and hygiene only): N/A (no void or bm)  Toileting assist Assist for toileting: Total Assistance - Patient < 25%     Transfers Chair/bed transfer  Transfers assist     Chair/bed  transfer assist level: Maximal Assistance - Patient 25 - 49% Chair/bed transfer assistive device: Sliding board   Locomotion Ambulation   Ambulation assist   Ambulation activity did not occur: Safety/medical concerns          Walk 10 feet activity   Assist  Walk 10 feet activity did not occur: Safety/medical concerns        Walk 50 feet activity   Assist Walk 50 feet with 2 turns activity did not occur: Safety/medical concerns         Walk 150 feet activity   Assist Walk 150 feet activity did not occur: Safety/medical concerns         Walk 10 feet on uneven surface  activity   Assist Walk 10 feet on uneven surfaces activity did not occur: Safety/medical concerns         Wheelchair     Assist Is the patient using a wheelchair?: Yes Type of Wheelchair:  Manual    Wheelchair assist level: Supervision/Verbal cueing Max wheelchair distance: 100'    Wheelchair 50 feet with 2 turns activity    Assist        Assist Level: Supervision/Verbal cueing   Wheelchair 150 feet activity     Assist      Assist Level: Supervision/Verbal cueing   Blood pressure 100/69, pulse 87, temperature 98.4 F (36.9 C), resp. rate 16, height '5\' 6"'$  (1.676 m), weight 121.5 kg, SpO2 94 %.  Medical Problem List and Plan: 1. 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 - Decadron taper             -patient may shower             -ELOS/Goals: 10-12 days, supervision goals with PT and OT   [facility] says Surgical intervention is possible- will likely need to check with family?  2/28- surgeon seeing pt/son this AM- con't CIR- PT and OT   d/c 3/14  Con't CIR- PT, OT- d/c tomorrow- can have 7 days of pain meds- has a doctor that prescribes pain meds for her- per pt.  2.  Antithrombotics: -DVT/anticoagulation:  Pharmaceutical: Heparin.               -antiplatelet therapy: Aspirin 325 mg daily 3. 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 might benefit from OA brace to help stabilize knee for mobility although it appears she's still pre-gait at this point   3/8- pt not walking- to bring in braces from home she already has.  3/12- says she has a doctor who prescribes pain meds for her outside hospital.  4. Mood: Trazodone 150 mg nightly, Xanax 0.25 mg twice daily as needed 2/28- d/w pt again that has to ask for meds- also let son know              -antipsychotic agents: N/A 5. Neuropsych: This patient is capable of making decisions on her own behalf. 6. Skin/Wound Care: Routine skin checks             -encourage appropriate nutrition              -pressure relief 7. Fluids/Electrolytes/Nutrition:   .               -added protein supp for low albumin 8.  Diabetes mellitus.  Hemoglobin A1c  6.0.                CBG (last 3)       3/12- BG's controlled- con't regimen 9.  Hypertension.  Lopressor 25 mg twice daily, hydralazine 25 mg every 8 hours.      Off all BP meds  3/12- BP controlled off meds- con't regimen Vitals:   12/24/21 2004 12/25/21 0523  BP: 109/81 100/69  Pulse: 85 87  Resp: 20 16  Temp: 98.8 F (37.1 C) 98.4 F (36.9 C)  SpO2: 100% 94%    10.  Hyperlipidemia.  Zocor 11.  Microcytic anemia.  Continue iron supplement.  Follow-up CBC 12.  Acute urinary retention/neurogenic bladder.  Foley tube removed.   -Presently on Flomax 0.4 mg.   -need pvr's  2/21- last cathed 2/19 early AM- but bladder scans <150 cc since then- off PVRs right now 13.  Constipation   -Senokot twice daily, MiraLAX daily Changed Senna to 2 tabs BID She notes improvement in bowel movements and states she did not take the laxatives yesterday 2/27- reduced laxatives to 3 pills/day. Since was having loose stools- 3 Bms yesterdays.  2/28- Reduced senna to 1 tab/day since on pain meds- but should reduce such frequent BM's.  3/1- still having BM's frequently- con't regimen for now- will wait to drop meds again. 3/2- have decreased frequency and urgency- con't regimen  3/3- will increase Senna to 2 tabs daily and add sorbitol prn if needs it.  3/10- LBM x2 yesterday- con't regimen  3/13- doing better- con't on lower dose of Senna 14.  Obesity.  BMI 39.5.  Dietary follow-up 15.  History of CVA.  Modified independent with straight point cane prior to admission.  Continue aspirin 16.  Leukocytosis.  Suspect steroid-induced--improved to 9.6 2/17  2/20- WBC down to 7.8k- con't regimen  2/28- WBC down to 3.3- will monitor 17. AKI  2/20- Cr up to 1.56 and BUN up to 31- will recheck Wednesday and push fluids- hopefully will help BP and Cr.   2/24- BUN still 29 and Cr 1.37- will give IVFs since pushing lfuids  isn't working- and recheck in AM- IVFs NS 75 cc/hour x 24 hours  Creatinine 1.29 on 2/25, improving  2/27- Cr/labs pending this AM- will monitor  2/28- Cr 1.25- BUN 19- not dry anymore- con't to monitor  3/6- Cr 1.20- doing much better- and BUN 16-   3/12- labs in AM  3/13- Cr down to 1.0- and BUN 16- doing well 18. Abrasion L buttock and R ankle  2/22- doesn' thave pressure ulcer- per my exam nor WOC- will con't wound care. 3/2- will double check with nursing, but WOC said was abrasion.    3/8- L buttock healed; R ankle from brace?- healing- ~ 43m deep 19.  Hyponatremia  Sodium 133 on 2/25-stable  2/28- Na 135-  3/9- Na 136-   3/13- Na 136    LOS: 25 days A FACE TO FACE EVALUATION WAS PERFORMED  Justyn Langham 12/25/2021, 8:10 AM

## 2021-12-26 DIAGNOSIS — G959 Disease of spinal cord, unspecified: Secondary | ICD-10-CM | POA: Diagnosis not present

## 2021-12-26 LAB — GLUCOSE, CAPILLARY
Glucose-Capillary: 113 mg/dL — ABNORMAL HIGH (ref 70–99)
Glucose-Capillary: 144 mg/dL — ABNORMAL HIGH (ref 70–99)

## 2021-12-26 NOTE — Progress Notes (Signed)
INPATIENT REHABILITATION DISCHARGE NOTE ? ? ?Discharge instructions by: Linna Hoff PA ? ?Verbalized understanding:yes ? ?Skin care/Wound care healing? ?Mid butt abrasion healing foam change ; patient educated ?Pain:back  10/10 ; oxy given prn ? ?IV's:none ? ?Tubes/Drains:none ? ?O2:none ? ?Safety instructions:done ? ?Patient belongings advised: ? ?Discharged VE:[id number] ? ?Discharged OFB:PZWC ? ?Notes: ? ? ? ? ? ? ?  ?

## 2021-12-26 NOTE — Progress Notes (Signed)
Inpatient Rehabilitation Care Coordinator ?Discharge Note  ? ?Patient Details  ?Name: Brandi [name] ?MRN: [id number] ?Date of Birth: 1956-03-17 ? ? ?Discharge location: D/ to home with support from pt son Brandi [name]. ? ?Length of Stay: 25 days ? ?Discharge activity level: discharge at a wheelchair level [place] ? ?Home/community participation: Limited ? ?Patient response BO:FBPZWC Literacy - How often do you need to have someone help you when you read instructions, pamphlets, or other written material from your doctor or pharmacy?: Rarely ? ?Patient response HE:NIDPOE Isolation - How often do you feel lonely or isolated from those around you?: Never ? ?Services provided included: MD, RD, PT, OT, RN, Pharmacy, CM, TR, Neuropsych, SW ? ?Financial Services:  ?Charity fundraiser Utilized: Private Insurance ?[employer] Medicare ? ?Choices offered to/list presented to: Yes ? ?Follow-up services arranged:  ?DME, Home Health ?Home Health Agency: [facility] for HHPT/OT  ?  ?DME : [place] for w/c, transfer board, hospital bed, bariatric DABSC ?  ? ?Patient response to transportation need: ?Is the patient able to respond to transportation needs?: Yes ?In the past 12 months, has lack of transportation kept you from medical appointments or from getting medications?: No ?In the past 12 months, has lack of transportation kept you from meetings, work, or from getting things needed for daily living?: No ? ?Comments (or additional information): ? ?Patient/Family verbalized understanding of follow-up arrangements:  Yes ? ?Individual responsible for coordination of the follow-up plan: contact pt son Brandi [name] ? ?Confirmed correct DME delivered: Brandi [name] 12/26/2021   ? ?Brandi [name] ?

## 2021-12-26 NOTE — Progress Notes (Signed)
?                                                       PROGRESS NOTE ? ? ?Subjective/Complaints: ? ?Explained again 7 days of pain meds at d/c- but then needs to get from pain doctor.  ? ?No issues- ready for d/c- [facility] picking her up at 10am ? ? ?ROS: ? ?Pt denies SOB, abd pain, CP, N/V/C/D, and vision changes ? ? ? ? ? ? ?Objective: ?  ?No results found. ?Recent Labs  ?  12/25/21 ?0737  ?WBC 7.0  ?HGB 12.0  ?HCT 37.3  ?PLT 263  ? ? ? ? ?Recent Labs  ?  12/25/21 ?1062  ?NA 136  ?K 3.9  ?CL 104  ?CO2 25  ?GLUCOSE 112*  ?BUN 16  ?CREATININE 1.00  ?CALCIUM 9.4  ? ? ? ? ?Intake/Output Summary (Last 24 hours) at 12/26/2021 0826 ?Last data filed at 12/25/2021 1856 ?Gross per 24 hour  ?Intake 360 ml  ?Output --  ?Net 360 ml  ?  ? ? ? ?Physical Exam: ?Vital Signs ?Blood pressure 99/62, pulse 99, temperature 98.8 ?F (37.1 ?C), temperature source Oral, resp. rate 18, height '5\' 6"'$  (1.676 m), weight 121.5 kg, SpO2 98 %. ? ? ? ? ? ? ? ? ? ? ?General: awake, alert, appropriate, NAD ?HENT: conjugate gaze; oropharynx moist ?CV: regular rate; no JVD ?Pulmonary: CTA B/L; no W/R/R- good air movement ?GI: soft, NT, ND, (+)BS ?Psychiatric: appropriate ?Neurological: stable aphasia ? ?Ext: no clubbing, cyanosis, or edema ?Psych: pleasant and cooperative  ?Extremities: No clubbing, cyanosis, or edema ?Neurologic: Alert,  mild word finding issues ?motor strength is /5 in bilateral deltoid, bicep, tricep, grip, 3- bilateral hip flexor, knee extensors, ankle dorsiflexor and plantar flexor ?Musc: right knee externally rotated with valgus deformity. Associated effusion. Tender with ROM ?Skin: abrasion on L buttock healed; has deeper abrasion on R lateral ankle above lateral malleolus- ~74m deep.  ? ?Assessment/Plan: ?1. Functional deficits which require 3+ hours per day of interdisciplinary therapy in a comprehensive inpatient rehab setting. ?Physiatrist is providing close team supervision and 24 hour management of active medical problems  listed below. ?Physiatrist and rehab team continue to assess barriers to discharge/monitor patient progress toward functional and medical goals ? ?Care Tool: ? ?Bathing ?   ?Body parts bathed by patient: Right arm, Left arm, Chest, Abdomen, Front perineal area, Right upper leg, Left upper leg, Face  ? Body parts bathed by helper: Buttocks, Right lower leg, Left lower leg ?  ?  ?Bathing assist Assist Level: Moderate Assistance - Patient 50 - 74% ?  ?  ?Upper Body Dressing/Undressing ?Upper body dressing   ?What is the patient wearing?: Pull over shirt ?   ?Upper body assist Assist Level: Supervision/Verbal cueing ?   ?Lower Body Dressing/Undressing ?Lower body dressing ? ? ?   ?What is the patient wearing?: Pants ? ?  ? ?Lower body assist Assist for lower body dressing: Maximal Assistance - Patient 25 - 49% ?   ? ?Toileting ?Toileting Toileting Activity did not occur (CProbation officerand hygiene only): N/A (no void or bm)  ?Toileting assist Assist for toileting: Total Assistance - Patient < 25% ?  ?  ?Transfers ?Chair/bed transfer ? ?Transfers assist ?   ? ?Chair/bed transfer assist level: Minimal Assistance - Patient >  75% ?Chair/bed transfer assistive device: Sliding board ?  ?Locomotion ?Ambulation ? ? ?Ambulation assist ? ? Ambulation activity did not occur: Safety/medical concerns ? ?  ?  ?   ? ?Walk 10 feet activity ? ? ?Assist ? Walk 10 feet activity did not occur: Safety/medical concerns ? ?  ?   ? ?Walk 50 feet activity ? ? ?Assist Walk 50 feet with 2 turns activity did not occur: Safety/medical concerns ? ?  ?   ? ? ?Walk 150 feet activity ? ? ?Assist Walk 150 feet activity did not occur: Safety/medical concerns ? ?  ?  ?  ? ?Walk 10 feet on uneven surface  ?activity ? ? ?Assist Walk 10 feet on uneven surfaces activity did not occur: Safety/medical concerns ? ? ?  ?   ? ?Wheelchair ? ? ? ? ?Assist Is the patient using a wheelchair?: Yes ?Type of Wheelchair: Manual ?  ? ?Wheelchair assist level:  Supervision/Verbal cueing ?Max wheelchair distance: >150 ft  ? ? ?Wheelchair 50 feet with 2 turns activity ? ? ? ?Assist ? ?  ?  ? ? ?Assist Level: Supervision/Verbal cueing  ? ?Wheelchair 150 feet activity  ? ? ? ?Assist ?   ? ? ?Assist Level: Supervision/Verbal cueing  ? ?Blood pressure 99/62, pulse 99, temperature 98.8 ?F (37.1 ?C), temperature source Oral, resp. rate 18, height '5\' 6"'$  (1.676 m), weight 121.5 kg, SpO2 98 %. ? ?Medical Problem List and Plan: ?1. 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 ?- Decadron taper ?            -patient may shower ?            -ELOS/Goals: 10-12 days, supervision goals with PT and OT ?  [facility] says Surgical intervention is possible- will likely need to check with family? ? 2/28- surgeon seeing pt/son this AM- con't CIR- PT and OT ?  d/c 3/14 ? Con't CIR- PT, OT- d/c tomorrow- can have 7 days of pain meds- has a doctor that prescribes pain meds for her- per pt.  ? D/c today by PT[facility] ?2.  Antithrombotics: ?-DVT/anticoagulation:  Pharmaceutical: Heparin.   ?            -antiplatelet therapy: Aspirin 325 mg daily ?3. Pain Management: Oxycodone as needed ?Knee pain 2019 xray show severe OA on RIght and Moderate on Left  OA will add voltaren gel  ? Changed Oxy to q4 hours prn ? 3/4 encouraged use of voltaren gel. might benefit from OA brace to help stabilize knee for mobility although it appears she's still pre-gait at this point ? ? 3/8- pt not walking- to bring in braces from home she already has.  ?3/12- says she has a doctor who prescribes pain meds for her outside hospital.  ?4. Mood: Trazodone 150 mg nightly, Xanax 0.25 mg twice daily as needed ?2/28- d/w pt again that has to ask for meds- also let son know  ?            -antipsychotic agents: N/A ?5. Neuropsych: This patient is capable of making decisions on her own behalf. ?6. Skin/Wound Care: Routine skin checks ?            -encourage appropriate nutrition  ?            -pressure relief ?7.  Fluids/Electrolytes/Nutrition:  .   ?            -added protein supp for low albumin ?8.  Diabetes mellitus.  Hemoglobin A1c  6.0.    ?            CBG (last 3)  ?     3/12- BG's controlled- con't regimen ?9.  Hypertension.  Lopressor 25 mg twice daily, hydralazine 25 mg every 8 hours.     ? Off all BP meds ? 3/12- BP controlled off meds- con't regimen ?Vitals:  ? 12/25/21 2100 12/26/21 0420  ?BP:  99/62  ?Pulse:  99  ?Resp: (!) 24 18  ?Temp:  98.8 ?F (37.1 ?C)  ?SpO2:  98%  ?  ?10.  Hyperlipidemia.  Zocor ?11.  Microcytic anemia.  Continue iron supplement.  Follow-up CBC ?12.  Acute urinary retention/neurogenic bladder.  Foley tube removed.   ?-Presently on Flomax 0.4 mg.   ?-need pvr's ? 2/21- last cathed 2/19 early AM- but bladder scans <150 cc since then- off PVRs right now ?13.  Constipation   ?-Senokot twice daily, MiraLAX daily ?Changed Senna to 2 tabs BID ?She notes improvement in bowel movements and states she did not take the laxatives yesterday ?2/27- reduced laxatives to 3 pills/day. Since was having loose stools- 3 Bms yesterdays.  ?2/28- Reduced senna to 1 tab/day since on pain meds- but should reduce such frequent BM's.  ?3/1- still having BM's frequently- con't regimen for now- will wait to drop meds again. ?3/2- have decreased frequency and urgency- con't regimen  ?3/3- will increase Senna to 2 tabs daily and add sorbitol prn if needs it.  ?3/10- LBM x2 yesterday- con't regimen ? 3/13- doing better- con't on lower dose of Senna ?14.  Obesity.  BMI 39.5.  Dietary follow-up ?15.  History of CVA.  Modified independent with straight point cane prior to admission.  Continue aspirin ?16.  Leukocytosis.  Suspect steroid-induced--improved to 9.6 2/17 ? 2/20- WBC down to 7.8k- con't regimen ? 2/28- WBC down to 3.3- will monitor ?17. AKI ? 2/20- Cr up to 1.56 and BUN up to 31- will recheck Wednesday and push fluids- hopefully will help BP and Cr.  ? 2/24- BUN still 29 and Cr 1.37- will give IVFs since pushing  lfuids isn't working- and recheck in AM- IVFs NS 75 cc/hour x 24 hours ? Creatinine 1.29 on 2/25, improving ? 2/27- Cr/labs pending this AM- will monitor ? 2/28- Cr 1.25- BUN 19- not dry anymore- con't to monitor ? 3/6- Cr 1.20- doing m

## 2021-12-28 ENCOUNTER — Telehealth: Payer: Self-pay

## 2021-12-28 NOTE — Telephone Encounter (Signed)
Transitional Care call--who you talked with [name]- son ? ? ? ?Are you/is patient experiencing any problems since coming home? NO Are there any questions regarding any aspect of care? NO ?Are there any questions regarding medications administration/dosing? NO Are meds being taken as prescribed? YES Patient should review meds with caller to confirm ?Have there been any falls? NO ?Has Home Health been to the house and/or have they contacted you? YES If not, have you tried to contact them? Can we help you contact them? ?Are bowels and bladder emptying properly? YES, stools have been darker. Are there any unexpected incontinence issues? NO If applicable, is patient following bowel/bladder programs? ?Any fevers, problems with breathing, unexpected pain? NO ?Are there any skin problems or new areas of breakdown? NO ?Has the patient/family member arranged specialty MD follow up (ie cardiology/neurology/renal/surgical/etc)? YES  Can we help arrange? ?Does the patient need any other services or support that we can help arrange? NO ?Are caregivers following through as expected in assisting the patient? YES ?Has the patient quit smoking, drinking alcohol, or using drugs as recommended? YES ? ?Appointment time, arrive time and who it is with here 01/08/22 at 9:20 arrival time 9 am with Danella Sensing, NP ?[address]   ?

## 2022-01-01 DIAGNOSIS — R29898 Other symptoms and signs involving the musculoskeletal system: Secondary | ICD-10-CM | POA: Diagnosis not present

## 2022-01-01 DIAGNOSIS — E119 Type 2 diabetes mellitus without complications: Secondary | ICD-10-CM | POA: Diagnosis not present

## 2022-01-01 DIAGNOSIS — M5134 Other intervertebral disc degeneration, thoracic region: Secondary | ICD-10-CM | POA: Diagnosis not present

## 2022-01-01 DIAGNOSIS — M4807 Spinal stenosis, lumbosacral region: Secondary | ICD-10-CM | POA: Diagnosis not present

## 2022-01-01 DIAGNOSIS — M5030 Other cervical disc degeneration, unspecified cervical region: Secondary | ICD-10-CM | POA: Diagnosis not present

## 2022-01-01 DIAGNOSIS — R531 Weakness: Secondary | ICD-10-CM | POA: Diagnosis not present

## 2022-01-01 DIAGNOSIS — M5136 Other intervertebral disc degeneration, lumbar region: Secondary | ICD-10-CM | POA: Diagnosis not present

## 2022-01-01 DIAGNOSIS — G959 Disease of spinal cord, unspecified: Secondary | ICD-10-CM | POA: Diagnosis not present

## 2022-01-01 DIAGNOSIS — I10 Essential (primary) hypertension: Secondary | ICD-10-CM | POA: Diagnosis not present

## 2022-01-01 DIAGNOSIS — I69351 Hemiplegia and hemiparesis following cerebral infarction affecting right dominant side: Secondary | ICD-10-CM | POA: Diagnosis not present

## 2022-01-03 ENCOUNTER — Ambulatory Visit: Payer: Medicare HMO | Admitting: Gastroenterology

## 2022-01-03 ENCOUNTER — Encounter: Payer: Self-pay | Admitting: Internal Medicine

## 2022-01-05 DIAGNOSIS — R531 Weakness: Secondary | ICD-10-CM | POA: Diagnosis not present

## 2022-01-05 DIAGNOSIS — I69351 Hemiplegia and hemiparesis following cerebral infarction affecting right dominant side: Secondary | ICD-10-CM | POA: Diagnosis not present

## 2022-01-05 DIAGNOSIS — I10 Essential (primary) hypertension: Secondary | ICD-10-CM | POA: Diagnosis not present

## 2022-01-05 DIAGNOSIS — G959 Disease of spinal cord, unspecified: Secondary | ICD-10-CM | POA: Diagnosis not present

## 2022-01-05 DIAGNOSIS — M5136 Other intervertebral disc degeneration, lumbar region: Secondary | ICD-10-CM | POA: Diagnosis not present

## 2022-01-05 DIAGNOSIS — E119 Type 2 diabetes mellitus without complications: Secondary | ICD-10-CM | POA: Diagnosis not present

## 2022-01-05 DIAGNOSIS — M4807 Spinal stenosis, lumbosacral region: Secondary | ICD-10-CM | POA: Diagnosis not present

## 2022-01-05 DIAGNOSIS — R29898 Other symptoms and signs involving the musculoskeletal system: Secondary | ICD-10-CM | POA: Diagnosis not present

## 2022-01-05 DIAGNOSIS — M5134 Other intervertebral disc degeneration, thoracic region: Secondary | ICD-10-CM | POA: Diagnosis not present

## 2022-01-05 DIAGNOSIS — M5030 Other cervical disc degeneration, unspecified cervical region: Secondary | ICD-10-CM | POA: Diagnosis not present

## 2022-01-08 ENCOUNTER — Telehealth: Payer: Self-pay | Admitting: *Deleted

## 2022-01-08 ENCOUNTER — Encounter: Payer: Medicare Other | Admitting: Registered Nurse

## 2022-01-08 DIAGNOSIS — I69351 Hemiplegia and hemiparesis following cerebral infarction affecting right dominant side: Secondary | ICD-10-CM | POA: Diagnosis not present

## 2022-01-08 DIAGNOSIS — R29898 Other symptoms and signs involving the musculoskeletal system: Secondary | ICD-10-CM | POA: Diagnosis not present

## 2022-01-08 DIAGNOSIS — G959 Disease of spinal cord, unspecified: Secondary | ICD-10-CM | POA: Diagnosis not present

## 2022-01-08 DIAGNOSIS — I10 Essential (primary) hypertension: Secondary | ICD-10-CM | POA: Diagnosis not present

## 2022-01-08 DIAGNOSIS — M5134 Other intervertebral disc degeneration, thoracic region: Secondary | ICD-10-CM | POA: Diagnosis not present

## 2022-01-08 DIAGNOSIS — M5136 Other intervertebral disc degeneration, lumbar region: Secondary | ICD-10-CM | POA: Diagnosis not present

## 2022-01-08 DIAGNOSIS — M5030 Other cervical disc degeneration, unspecified cervical region: Secondary | ICD-10-CM | POA: Diagnosis not present

## 2022-01-08 DIAGNOSIS — M4807 Spinal stenosis, lumbosacral region: Secondary | ICD-10-CM | POA: Diagnosis not present

## 2022-01-08 DIAGNOSIS — R531 Weakness: Secondary | ICD-10-CM | POA: Diagnosis not present

## 2022-01-08 DIAGNOSIS — E119 Type 2 diabetes mellitus without complications: Secondary | ICD-10-CM | POA: Diagnosis not present

## 2022-01-08 NOTE — Telephone Encounter (Signed)
Will OT with [facility] called for POC 2wk6.  Approval given. ?

## 2022-01-09 ENCOUNTER — Encounter: Payer: Self-pay | Admitting: *Deleted

## 2022-01-12 DIAGNOSIS — M5030 Other cervical disc degeneration, unspecified cervical region: Secondary | ICD-10-CM | POA: Diagnosis not present

## 2022-01-12 DIAGNOSIS — E119 Type 2 diabetes mellitus without complications: Secondary | ICD-10-CM | POA: Diagnosis not present

## 2022-01-12 DIAGNOSIS — I69351 Hemiplegia and hemiparesis following cerebral infarction affecting right dominant side: Secondary | ICD-10-CM | POA: Diagnosis not present

## 2022-01-12 DIAGNOSIS — M5136 Other intervertebral disc degeneration, lumbar region: Secondary | ICD-10-CM | POA: Diagnosis not present

## 2022-01-12 DIAGNOSIS — R29898 Other symptoms and signs involving the musculoskeletal system: Secondary | ICD-10-CM | POA: Diagnosis not present

## 2022-01-12 DIAGNOSIS — M5134 Other intervertebral disc degeneration, thoracic region: Secondary | ICD-10-CM | POA: Diagnosis not present

## 2022-01-12 DIAGNOSIS — M4807 Spinal stenosis, lumbosacral region: Secondary | ICD-10-CM | POA: Diagnosis not present

## 2022-01-12 DIAGNOSIS — R531 Weakness: Secondary | ICD-10-CM | POA: Diagnosis not present

## 2022-01-12 DIAGNOSIS — I10 Essential (primary) hypertension: Secondary | ICD-10-CM | POA: Diagnosis not present

## 2022-01-12 DIAGNOSIS — G959 Disease of spinal cord, unspecified: Secondary | ICD-10-CM | POA: Diagnosis not present

## 2022-01-15 DIAGNOSIS — R29898 Other symptoms and signs involving the musculoskeletal system: Secondary | ICD-10-CM | POA: Diagnosis not present

## 2022-01-15 DIAGNOSIS — M4807 Spinal stenosis, lumbosacral region: Secondary | ICD-10-CM | POA: Diagnosis not present

## 2022-01-15 DIAGNOSIS — M5136 Other intervertebral disc degeneration, lumbar region: Secondary | ICD-10-CM | POA: Diagnosis not present

## 2022-01-15 DIAGNOSIS — M5134 Other intervertebral disc degeneration, thoracic region: Secondary | ICD-10-CM | POA: Diagnosis not present

## 2022-01-15 DIAGNOSIS — I10 Essential (primary) hypertension: Secondary | ICD-10-CM | POA: Diagnosis not present

## 2022-01-15 DIAGNOSIS — M5030 Other cervical disc degeneration, unspecified cervical region: Secondary | ICD-10-CM | POA: Diagnosis not present

## 2022-01-15 DIAGNOSIS — R531 Weakness: Secondary | ICD-10-CM | POA: Diagnosis not present

## 2022-01-15 DIAGNOSIS — I69351 Hemiplegia and hemiparesis following cerebral infarction affecting right dominant side: Secondary | ICD-10-CM | POA: Diagnosis not present

## 2022-01-15 DIAGNOSIS — E119 Type 2 diabetes mellitus without complications: Secondary | ICD-10-CM | POA: Diagnosis not present

## 2022-01-15 DIAGNOSIS — G959 Disease of spinal cord, unspecified: Secondary | ICD-10-CM | POA: Diagnosis not present

## 2022-01-18 ENCOUNTER — Encounter: Payer: Medicare Other | Attending: Registered Nurse | Admitting: Registered Nurse

## 2022-01-19 DIAGNOSIS — I10 Essential (primary) hypertension: Secondary | ICD-10-CM | POA: Diagnosis not present

## 2022-01-19 DIAGNOSIS — M5030 Other cervical disc degeneration, unspecified cervical region: Secondary | ICD-10-CM | POA: Diagnosis not present

## 2022-01-19 DIAGNOSIS — G959 Disease of spinal cord, unspecified: Secondary | ICD-10-CM | POA: Diagnosis not present

## 2022-01-19 DIAGNOSIS — M5136 Other intervertebral disc degeneration, lumbar region: Secondary | ICD-10-CM | POA: Diagnosis not present

## 2022-01-19 DIAGNOSIS — M4807 Spinal stenosis, lumbosacral region: Secondary | ICD-10-CM | POA: Diagnosis not present

## 2022-01-19 DIAGNOSIS — E119 Type 2 diabetes mellitus without complications: Secondary | ICD-10-CM | POA: Diagnosis not present

## 2022-01-19 DIAGNOSIS — R531 Weakness: Secondary | ICD-10-CM | POA: Diagnosis not present

## 2022-01-19 DIAGNOSIS — M5134 Other intervertebral disc degeneration, thoracic region: Secondary | ICD-10-CM | POA: Diagnosis not present

## 2022-01-19 DIAGNOSIS — I69351 Hemiplegia and hemiparesis following cerebral infarction affecting right dominant side: Secondary | ICD-10-CM | POA: Diagnosis not present

## 2022-01-19 DIAGNOSIS — R29898 Other symptoms and signs involving the musculoskeletal system: Secondary | ICD-10-CM | POA: Diagnosis not present

## 2022-01-23 ENCOUNTER — Telehealth: Payer: Self-pay | Admitting: *Deleted

## 2022-01-23 DIAGNOSIS — I69351 Hemiplegia and hemiparesis following cerebral infarction affecting right dominant side: Secondary | ICD-10-CM | POA: Diagnosis not present

## 2022-01-23 DIAGNOSIS — M4807 Spinal stenosis, lumbosacral region: Secondary | ICD-10-CM | POA: Diagnosis not present

## 2022-01-23 DIAGNOSIS — M5136 Other intervertebral disc degeneration, lumbar region: Secondary | ICD-10-CM | POA: Diagnosis not present

## 2022-01-23 DIAGNOSIS — R531 Weakness: Secondary | ICD-10-CM | POA: Diagnosis not present

## 2022-01-23 DIAGNOSIS — M5030 Other cervical disc degeneration, unspecified cervical region: Secondary | ICD-10-CM | POA: Diagnosis not present

## 2022-01-23 DIAGNOSIS — R29898 Other symptoms and signs involving the musculoskeletal system: Secondary | ICD-10-CM | POA: Diagnosis not present

## 2022-01-23 DIAGNOSIS — I10 Essential (primary) hypertension: Secondary | ICD-10-CM | POA: Diagnosis not present

## 2022-01-23 DIAGNOSIS — M5134 Other intervertebral disc degeneration, thoracic region: Secondary | ICD-10-CM | POA: Diagnosis not present

## 2022-01-23 DIAGNOSIS — G959 Disease of spinal cord, unspecified: Secondary | ICD-10-CM | POA: Diagnosis not present

## 2022-01-23 DIAGNOSIS — E119 Type 2 diabetes mellitus without complications: Secondary | ICD-10-CM | POA: Diagnosis not present

## 2022-01-23 NOTE — Telephone Encounter (Signed)
[name] from [facility] called to report a blood pressure on Brandi [name]. She said the BP was 172/95 and was about an hour after she had taken her medications. She did say she was anxious at the time. I let her know that we typically request BP issues be called to PCP but I would let Dr Dagoberto Ligas know.  ?

## 2022-01-24 DIAGNOSIS — M5134 Other intervertebral disc degeneration, thoracic region: Secondary | ICD-10-CM | POA: Diagnosis not present

## 2022-01-24 DIAGNOSIS — I10 Essential (primary) hypertension: Secondary | ICD-10-CM | POA: Diagnosis not present

## 2022-01-24 DIAGNOSIS — M5030 Other cervical disc degeneration, unspecified cervical region: Secondary | ICD-10-CM | POA: Diagnosis not present

## 2022-01-24 DIAGNOSIS — R531 Weakness: Secondary | ICD-10-CM | POA: Diagnosis not present

## 2022-01-24 DIAGNOSIS — M4807 Spinal stenosis, lumbosacral region: Secondary | ICD-10-CM | POA: Diagnosis not present

## 2022-01-24 DIAGNOSIS — R29898 Other symptoms and signs involving the musculoskeletal system: Secondary | ICD-10-CM | POA: Diagnosis not present

## 2022-01-24 DIAGNOSIS — E119 Type 2 diabetes mellitus without complications: Secondary | ICD-10-CM | POA: Diagnosis not present

## 2022-01-24 DIAGNOSIS — I69351 Hemiplegia and hemiparesis following cerebral infarction affecting right dominant side: Secondary | ICD-10-CM | POA: Diagnosis not present

## 2022-01-24 DIAGNOSIS — G959 Disease of spinal cord, unspecified: Secondary | ICD-10-CM | POA: Diagnosis not present

## 2022-01-24 DIAGNOSIS — M5136 Other intervertebral disc degeneration, lumbar region: Secondary | ICD-10-CM | POA: Diagnosis not present

## 2022-01-25 DIAGNOSIS — I10 Essential (primary) hypertension: Secondary | ICD-10-CM | POA: Diagnosis not present

## 2022-01-25 DIAGNOSIS — E119 Type 2 diabetes mellitus without complications: Secondary | ICD-10-CM | POA: Diagnosis not present

## 2022-01-25 DIAGNOSIS — M5134 Other intervertebral disc degeneration, thoracic region: Secondary | ICD-10-CM | POA: Diagnosis not present

## 2022-01-25 DIAGNOSIS — I69351 Hemiplegia and hemiparesis following cerebral infarction affecting right dominant side: Secondary | ICD-10-CM | POA: Diagnosis not present

## 2022-01-25 DIAGNOSIS — R531 Weakness: Secondary | ICD-10-CM | POA: Diagnosis not present

## 2022-01-25 DIAGNOSIS — M5030 Other cervical disc degeneration, unspecified cervical region: Secondary | ICD-10-CM | POA: Diagnosis not present

## 2022-01-25 DIAGNOSIS — M5136 Other intervertebral disc degeneration, lumbar region: Secondary | ICD-10-CM | POA: Diagnosis not present

## 2022-01-25 DIAGNOSIS — R29898 Other symptoms and signs involving the musculoskeletal system: Secondary | ICD-10-CM | POA: Diagnosis not present

## 2022-01-25 DIAGNOSIS — M4807 Spinal stenosis, lumbosacral region: Secondary | ICD-10-CM | POA: Diagnosis not present

## 2022-01-25 DIAGNOSIS — G959 Disease of spinal cord, unspecified: Secondary | ICD-10-CM | POA: Diagnosis not present

## 2022-01-29 DIAGNOSIS — M5136 Other intervertebral disc degeneration, lumbar region: Secondary | ICD-10-CM | POA: Diagnosis not present

## 2022-01-29 DIAGNOSIS — E782 Mixed hyperlipidemia: Secondary | ICD-10-CM

## 2022-01-29 DIAGNOSIS — E669 Obesity, unspecified: Secondary | ICD-10-CM

## 2022-01-29 DIAGNOSIS — R29898 Other symptoms and signs involving the musculoskeletal system: Secondary | ICD-10-CM | POA: Diagnosis not present

## 2022-01-29 DIAGNOSIS — I10 Essential (primary) hypertension: Secondary | ICD-10-CM | POA: Diagnosis not present

## 2022-01-29 DIAGNOSIS — M4807 Spinal stenosis, lumbosacral region: Secondary | ICD-10-CM | POA: Diagnosis not present

## 2022-01-29 DIAGNOSIS — I69351 Hemiplegia and hemiparesis following cerebral infarction affecting right dominant side: Secondary | ICD-10-CM | POA: Diagnosis not present

## 2022-01-29 DIAGNOSIS — G959 Disease of spinal cord, unspecified: Secondary | ICD-10-CM | POA: Diagnosis not present

## 2022-01-29 DIAGNOSIS — E119 Type 2 diabetes mellitus without complications: Secondary | ICD-10-CM | POA: Diagnosis not present

## 2022-01-29 DIAGNOSIS — M5030 Other cervical disc degeneration, unspecified cervical region: Secondary | ICD-10-CM | POA: Diagnosis not present

## 2022-01-29 DIAGNOSIS — M5134 Other intervertebral disc degeneration, thoracic region: Secondary | ICD-10-CM | POA: Diagnosis not present

## 2022-01-29 DIAGNOSIS — R531 Weakness: Secondary | ICD-10-CM | POA: Diagnosis not present

## 2022-01-29 DIAGNOSIS — R339 Retention of urine, unspecified: Secondary | ICD-10-CM | POA: Diagnosis not present

## 2022-01-29 DIAGNOSIS — D509 Iron deficiency anemia, unspecified: Secondary | ICD-10-CM | POA: Diagnosis not present

## 2022-01-30 DIAGNOSIS — I10 Essential (primary) hypertension: Secondary | ICD-10-CM | POA: Diagnosis not present

## 2022-01-30 DIAGNOSIS — M4807 Spinal stenosis, lumbosacral region: Secondary | ICD-10-CM | POA: Diagnosis not present

## 2022-01-30 DIAGNOSIS — E119 Type 2 diabetes mellitus without complications: Secondary | ICD-10-CM | POA: Diagnosis not present

## 2022-01-30 DIAGNOSIS — G959 Disease of spinal cord, unspecified: Secondary | ICD-10-CM | POA: Diagnosis not present

## 2022-01-30 DIAGNOSIS — M5134 Other intervertebral disc degeneration, thoracic region: Secondary | ICD-10-CM | POA: Diagnosis not present

## 2022-01-30 DIAGNOSIS — R29898 Other symptoms and signs involving the musculoskeletal system: Secondary | ICD-10-CM | POA: Diagnosis not present

## 2022-01-30 DIAGNOSIS — M5136 Other intervertebral disc degeneration, lumbar region: Secondary | ICD-10-CM | POA: Diagnosis not present

## 2022-01-30 DIAGNOSIS — R531 Weakness: Secondary | ICD-10-CM | POA: Diagnosis not present

## 2022-01-30 DIAGNOSIS — I69351 Hemiplegia and hemiparesis following cerebral infarction affecting right dominant side: Secondary | ICD-10-CM | POA: Diagnosis not present

## 2022-01-30 DIAGNOSIS — M5030 Other cervical disc degeneration, unspecified cervical region: Secondary | ICD-10-CM | POA: Diagnosis not present

## 2022-02-01 DIAGNOSIS — M4807 Spinal stenosis, lumbosacral region: Secondary | ICD-10-CM | POA: Diagnosis not present

## 2022-02-01 DIAGNOSIS — M5030 Other cervical disc degeneration, unspecified cervical region: Secondary | ICD-10-CM | POA: Diagnosis not present

## 2022-02-01 DIAGNOSIS — G959 Disease of spinal cord, unspecified: Secondary | ICD-10-CM | POA: Diagnosis not present

## 2022-02-01 DIAGNOSIS — R531 Weakness: Secondary | ICD-10-CM | POA: Diagnosis not present

## 2022-02-01 DIAGNOSIS — I69351 Hemiplegia and hemiparesis following cerebral infarction affecting right dominant side: Secondary | ICD-10-CM | POA: Diagnosis not present

## 2022-02-01 DIAGNOSIS — M5136 Other intervertebral disc degeneration, lumbar region: Secondary | ICD-10-CM | POA: Diagnosis not present

## 2022-02-01 DIAGNOSIS — I10 Essential (primary) hypertension: Secondary | ICD-10-CM | POA: Diagnosis not present

## 2022-02-01 DIAGNOSIS — E119 Type 2 diabetes mellitus without complications: Secondary | ICD-10-CM | POA: Diagnosis not present

## 2022-02-01 DIAGNOSIS — M5134 Other intervertebral disc degeneration, thoracic region: Secondary | ICD-10-CM | POA: Diagnosis not present

## 2022-02-01 DIAGNOSIS — R29898 Other symptoms and signs involving the musculoskeletal system: Secondary | ICD-10-CM | POA: Diagnosis not present

## 2022-02-06 ENCOUNTER — Telehealth: Payer: Self-pay | Admitting: *Deleted

## 2022-02-06 DIAGNOSIS — M5134 Other intervertebral disc degeneration, thoracic region: Secondary | ICD-10-CM | POA: Diagnosis not present

## 2022-02-06 DIAGNOSIS — R29898 Other symptoms and signs involving the musculoskeletal system: Secondary | ICD-10-CM | POA: Diagnosis not present

## 2022-02-06 DIAGNOSIS — I69351 Hemiplegia and hemiparesis following cerebral infarction affecting right dominant side: Secondary | ICD-10-CM | POA: Diagnosis not present

## 2022-02-06 DIAGNOSIS — E119 Type 2 diabetes mellitus without complications: Secondary | ICD-10-CM | POA: Diagnosis not present

## 2022-02-06 DIAGNOSIS — M4807 Spinal stenosis, lumbosacral region: Secondary | ICD-10-CM | POA: Diagnosis not present

## 2022-02-06 DIAGNOSIS — I10 Essential (primary) hypertension: Secondary | ICD-10-CM | POA: Diagnosis not present

## 2022-02-06 DIAGNOSIS — G959 Disease of spinal cord, unspecified: Secondary | ICD-10-CM | POA: Diagnosis not present

## 2022-02-06 DIAGNOSIS — M5030 Other cervical disc degeneration, unspecified cervical region: Secondary | ICD-10-CM | POA: Diagnosis not present

## 2022-02-06 DIAGNOSIS — R531 Weakness: Secondary | ICD-10-CM | POA: Diagnosis not present

## 2022-02-06 DIAGNOSIS — M5136 Other intervertebral disc degeneration, lumbar region: Secondary | ICD-10-CM | POA: Diagnosis not present

## 2022-02-06 NOTE — Telephone Encounter (Signed)
[name] from [facility] called to report that Brandi [name]'s son reports that her urine has had a strong odor for the past week. No sx but urine is darker than usual.  If orders to be given they should be called to the office # [phone]  FYI she did not show for TC and cancelled second appt set up. No appt scheduled for follow up. ?

## 2022-02-07 DIAGNOSIS — M5134 Other intervertebral disc degeneration, thoracic region: Secondary | ICD-10-CM | POA: Diagnosis not present

## 2022-02-07 DIAGNOSIS — M5030 Other cervical disc degeneration, unspecified cervical region: Secondary | ICD-10-CM | POA: Diagnosis not present

## 2022-02-07 DIAGNOSIS — G959 Disease of spinal cord, unspecified: Secondary | ICD-10-CM | POA: Diagnosis not present

## 2022-02-07 DIAGNOSIS — M4807 Spinal stenosis, lumbosacral region: Secondary | ICD-10-CM | POA: Diagnosis not present

## 2022-02-07 DIAGNOSIS — R531 Weakness: Secondary | ICD-10-CM | POA: Diagnosis not present

## 2022-02-07 DIAGNOSIS — M5136 Other intervertebral disc degeneration, lumbar region: Secondary | ICD-10-CM | POA: Diagnosis not present

## 2022-02-07 DIAGNOSIS — E119 Type 2 diabetes mellitus without complications: Secondary | ICD-10-CM | POA: Diagnosis not present

## 2022-02-07 DIAGNOSIS — R29898 Other symptoms and signs involving the musculoskeletal system: Secondary | ICD-10-CM | POA: Diagnosis not present

## 2022-02-07 DIAGNOSIS — I10 Essential (primary) hypertension: Secondary | ICD-10-CM | POA: Diagnosis not present

## 2022-02-07 DIAGNOSIS — I69351 Hemiplegia and hemiparesis following cerebral infarction affecting right dominant side: Secondary | ICD-10-CM | POA: Diagnosis not present

## 2022-02-07 NOTE — Telephone Encounter (Signed)
Notified. 

## 2022-02-07 NOTE — Telephone Encounter (Signed)
Pt needs to see PCP- since hasn't shown up for appointment- thanks- ML

## 2022-02-08 ENCOUNTER — Telehealth: Payer: Self-pay

## 2022-02-08 DIAGNOSIS — R29898 Other symptoms and signs involving the musculoskeletal system: Secondary | ICD-10-CM | POA: Diagnosis not present

## 2022-02-08 DIAGNOSIS — I10 Essential (primary) hypertension: Secondary | ICD-10-CM | POA: Diagnosis not present

## 2022-02-08 DIAGNOSIS — E119 Type 2 diabetes mellitus without complications: Secondary | ICD-10-CM | POA: Diagnosis not present

## 2022-02-08 DIAGNOSIS — M4807 Spinal stenosis, lumbosacral region: Secondary | ICD-10-CM | POA: Diagnosis not present

## 2022-02-08 DIAGNOSIS — M5136 Other intervertebral disc degeneration, lumbar region: Secondary | ICD-10-CM | POA: Diagnosis not present

## 2022-02-08 DIAGNOSIS — M5030 Other cervical disc degeneration, unspecified cervical region: Secondary | ICD-10-CM | POA: Diagnosis not present

## 2022-02-08 DIAGNOSIS — G959 Disease of spinal cord, unspecified: Secondary | ICD-10-CM | POA: Diagnosis not present

## 2022-02-08 DIAGNOSIS — R531 Weakness: Secondary | ICD-10-CM | POA: Diagnosis not present

## 2022-02-08 DIAGNOSIS — I69351 Hemiplegia and hemiparesis following cerebral infarction affecting right dominant side: Secondary | ICD-10-CM | POA: Diagnosis not present

## 2022-02-08 DIAGNOSIS — M5134 Other intervertebral disc degeneration, thoracic region: Secondary | ICD-10-CM | POA: Diagnosis not present

## 2022-02-08 NOTE — Telephone Encounter (Signed)
[name] from [facility] called to inform us that her diastolic blood pressure was 98. She also stated that the  son stated she had a strong smelling urine that was darker than normal. She stated that it improved with an increase in water and decrease in sodas. ?

## 2022-02-12 ENCOUNTER — Telehealth: Payer: Self-pay

## 2022-02-12 DIAGNOSIS — E119 Type 2 diabetes mellitus without complications: Secondary | ICD-10-CM | POA: Diagnosis not present

## 2022-02-12 DIAGNOSIS — G959 Disease of spinal cord, unspecified: Secondary | ICD-10-CM | POA: Diagnosis not present

## 2022-02-12 DIAGNOSIS — R531 Weakness: Secondary | ICD-10-CM | POA: Diagnosis not present

## 2022-02-12 DIAGNOSIS — I69351 Hemiplegia and hemiparesis following cerebral infarction affecting right dominant side: Secondary | ICD-10-CM | POA: Diagnosis not present

## 2022-02-12 DIAGNOSIS — R29898 Other symptoms and signs involving the musculoskeletal system: Secondary | ICD-10-CM | POA: Diagnosis not present

## 2022-02-12 DIAGNOSIS — M4807 Spinal stenosis, lumbosacral region: Secondary | ICD-10-CM | POA: Diagnosis not present

## 2022-02-12 DIAGNOSIS — M5134 Other intervertebral disc degeneration, thoracic region: Secondary | ICD-10-CM | POA: Diagnosis not present

## 2022-02-12 DIAGNOSIS — M5136 Other intervertebral disc degeneration, lumbar region: Secondary | ICD-10-CM | POA: Diagnosis not present

## 2022-02-12 DIAGNOSIS — M5030 Other cervical disc degeneration, unspecified cervical region: Secondary | ICD-10-CM | POA: Diagnosis not present

## 2022-02-12 DIAGNOSIS — I10 Essential (primary) hypertension: Secondary | ICD-10-CM | POA: Diagnosis not present

## 2022-02-12 NOTE — Telephone Encounter (Signed)
1.Per Brandi [name] (Son) Brandi [name] has not been here for her appointments because she needs a smaller wheelchair. Can you help her get a smaller wheelchair? ? ?Per Brandi [name] she will be seeing her PCP Brandi [name] on 02/21/2022. ? ?2. Brandi [name] ( ph [phone]) the Home Health Nurse called to report an elevated blood pressure 162/95, with no cardiac complaints.  ? ?Brandi [name] has been advised to call the PCP about the elevated blood pressure. ? ?Please advise.  ? ?

## 2022-02-13 DIAGNOSIS — M5136 Other intervertebral disc degeneration, lumbar region: Secondary | ICD-10-CM | POA: Diagnosis not present

## 2022-02-13 DIAGNOSIS — I10 Essential (primary) hypertension: Secondary | ICD-10-CM | POA: Diagnosis not present

## 2022-02-13 DIAGNOSIS — M5134 Other intervertebral disc degeneration, thoracic region: Secondary | ICD-10-CM | POA: Diagnosis not present

## 2022-02-13 DIAGNOSIS — M5030 Other cervical disc degeneration, unspecified cervical region: Secondary | ICD-10-CM | POA: Diagnosis not present

## 2022-02-13 DIAGNOSIS — I69351 Hemiplegia and hemiparesis following cerebral infarction affecting right dominant side: Secondary | ICD-10-CM | POA: Diagnosis not present

## 2022-02-13 DIAGNOSIS — R531 Weakness: Secondary | ICD-10-CM | POA: Diagnosis not present

## 2022-02-13 DIAGNOSIS — R29898 Other symptoms and signs involving the musculoskeletal system: Secondary | ICD-10-CM | POA: Diagnosis not present

## 2022-02-13 DIAGNOSIS — E119 Type 2 diabetes mellitus without complications: Secondary | ICD-10-CM | POA: Diagnosis not present

## 2022-02-13 DIAGNOSIS — M4807 Spinal stenosis, lumbosacral region: Secondary | ICD-10-CM | POA: Diagnosis not present

## 2022-02-13 DIAGNOSIS — G959 Disease of spinal cord, unspecified: Secondary | ICD-10-CM | POA: Diagnosis not present

## 2022-02-14 DIAGNOSIS — M5030 Other cervical disc degeneration, unspecified cervical region: Secondary | ICD-10-CM | POA: Diagnosis not present

## 2022-02-14 DIAGNOSIS — I10 Essential (primary) hypertension: Secondary | ICD-10-CM | POA: Diagnosis not present

## 2022-02-14 DIAGNOSIS — G959 Disease of spinal cord, unspecified: Secondary | ICD-10-CM | POA: Diagnosis not present

## 2022-02-14 DIAGNOSIS — M5136 Other intervertebral disc degeneration, lumbar region: Secondary | ICD-10-CM | POA: Diagnosis not present

## 2022-02-14 DIAGNOSIS — R531 Weakness: Secondary | ICD-10-CM | POA: Diagnosis not present

## 2022-02-14 DIAGNOSIS — I69351 Hemiplegia and hemiparesis following cerebral infarction affecting right dominant side: Secondary | ICD-10-CM | POA: Diagnosis not present

## 2022-02-14 DIAGNOSIS — E119 Type 2 diabetes mellitus without complications: Secondary | ICD-10-CM | POA: Diagnosis not present

## 2022-02-14 DIAGNOSIS — M5134 Other intervertebral disc degeneration, thoracic region: Secondary | ICD-10-CM | POA: Diagnosis not present

## 2022-02-14 DIAGNOSIS — R29898 Other symptoms and signs involving the musculoskeletal system: Secondary | ICD-10-CM | POA: Diagnosis not present

## 2022-02-14 DIAGNOSIS — M4807 Spinal stenosis, lumbosacral region: Secondary | ICD-10-CM | POA: Diagnosis not present

## 2022-02-14 NOTE — Telephone Encounter (Signed)
Per Medicare guidelines, I am not allowed to write for a new w/c without seeing pt in person- literally- I'm not sure why this is an issue, because when she was in Rehab, she was supposedly fitted for a w/c However if seeing PCP on 5/10, they should be able to help pt get new w/c, hopefully.  ?ML

## 2022-02-16 DIAGNOSIS — E119 Type 2 diabetes mellitus without complications: Secondary | ICD-10-CM | POA: Diagnosis not present

## 2022-02-16 DIAGNOSIS — M5134 Other intervertebral disc degeneration, thoracic region: Secondary | ICD-10-CM | POA: Diagnosis not present

## 2022-02-16 DIAGNOSIS — R531 Weakness: Secondary | ICD-10-CM | POA: Diagnosis not present

## 2022-02-16 DIAGNOSIS — M5030 Other cervical disc degeneration, unspecified cervical region: Secondary | ICD-10-CM | POA: Diagnosis not present

## 2022-02-16 DIAGNOSIS — G959 Disease of spinal cord, unspecified: Secondary | ICD-10-CM | POA: Diagnosis not present

## 2022-02-16 DIAGNOSIS — I69351 Hemiplegia and hemiparesis following cerebral infarction affecting right dominant side: Secondary | ICD-10-CM | POA: Diagnosis not present

## 2022-02-16 DIAGNOSIS — M4807 Spinal stenosis, lumbosacral region: Secondary | ICD-10-CM | POA: Diagnosis not present

## 2022-02-16 DIAGNOSIS — R29898 Other symptoms and signs involving the musculoskeletal system: Secondary | ICD-10-CM | POA: Diagnosis not present

## 2022-02-16 DIAGNOSIS — M5136 Other intervertebral disc degeneration, lumbar region: Secondary | ICD-10-CM | POA: Diagnosis not present

## 2022-02-16 DIAGNOSIS — I10 Essential (primary) hypertension: Secondary | ICD-10-CM | POA: Diagnosis not present

## 2022-02-19 DIAGNOSIS — G959 Disease of spinal cord, unspecified: Secondary | ICD-10-CM | POA: Diagnosis not present

## 2022-02-19 DIAGNOSIS — R29898 Other symptoms and signs involving the musculoskeletal system: Secondary | ICD-10-CM | POA: Diagnosis not present

## 2022-02-19 DIAGNOSIS — M4807 Spinal stenosis, lumbosacral region: Secondary | ICD-10-CM | POA: Diagnosis not present

## 2022-02-19 DIAGNOSIS — I10 Essential (primary) hypertension: Secondary | ICD-10-CM | POA: Diagnosis not present

## 2022-02-19 DIAGNOSIS — R531 Weakness: Secondary | ICD-10-CM | POA: Diagnosis not present

## 2022-02-19 DIAGNOSIS — E119 Type 2 diabetes mellitus without complications: Secondary | ICD-10-CM | POA: Diagnosis not present

## 2022-02-19 DIAGNOSIS — M5136 Other intervertebral disc degeneration, lumbar region: Secondary | ICD-10-CM | POA: Diagnosis not present

## 2022-02-19 DIAGNOSIS — M5134 Other intervertebral disc degeneration, thoracic region: Secondary | ICD-10-CM | POA: Diagnosis not present

## 2022-02-19 DIAGNOSIS — M5030 Other cervical disc degeneration, unspecified cervical region: Secondary | ICD-10-CM | POA: Diagnosis not present

## 2022-02-19 DIAGNOSIS — I69351 Hemiplegia and hemiparesis following cerebral infarction affecting right dominant side: Secondary | ICD-10-CM | POA: Diagnosis not present

## 2022-02-20 DIAGNOSIS — M5134 Other intervertebral disc degeneration, thoracic region: Secondary | ICD-10-CM | POA: Diagnosis not present

## 2022-02-20 DIAGNOSIS — R29898 Other symptoms and signs involving the musculoskeletal system: Secondary | ICD-10-CM | POA: Diagnosis not present

## 2022-02-20 DIAGNOSIS — M4807 Spinal stenosis, lumbosacral region: Secondary | ICD-10-CM | POA: Diagnosis not present

## 2022-02-20 DIAGNOSIS — G959 Disease of spinal cord, unspecified: Secondary | ICD-10-CM | POA: Diagnosis not present

## 2022-02-20 DIAGNOSIS — M5136 Other intervertebral disc degeneration, lumbar region: Secondary | ICD-10-CM | POA: Diagnosis not present

## 2022-02-20 DIAGNOSIS — I69351 Hemiplegia and hemiparesis following cerebral infarction affecting right dominant side: Secondary | ICD-10-CM | POA: Diagnosis not present

## 2022-02-20 DIAGNOSIS — I10 Essential (primary) hypertension: Secondary | ICD-10-CM | POA: Diagnosis not present

## 2022-02-20 DIAGNOSIS — R531 Weakness: Secondary | ICD-10-CM | POA: Diagnosis not present

## 2022-02-20 DIAGNOSIS — M5030 Other cervical disc degeneration, unspecified cervical region: Secondary | ICD-10-CM | POA: Diagnosis not present

## 2022-02-20 DIAGNOSIS — E119 Type 2 diabetes mellitus without complications: Secondary | ICD-10-CM | POA: Diagnosis not present

## 2022-02-21 DIAGNOSIS — R531 Weakness: Secondary | ICD-10-CM | POA: Diagnosis not present

## 2022-02-21 DIAGNOSIS — I10 Essential (primary) hypertension: Secondary | ICD-10-CM | POA: Diagnosis not present

## 2022-02-21 DIAGNOSIS — G959 Disease of spinal cord, unspecified: Secondary | ICD-10-CM | POA: Diagnosis not present

## 2022-02-21 DIAGNOSIS — M5134 Other intervertebral disc degeneration, thoracic region: Secondary | ICD-10-CM | POA: Diagnosis not present

## 2022-02-21 DIAGNOSIS — M5030 Other cervical disc degeneration, unspecified cervical region: Secondary | ICD-10-CM | POA: Diagnosis not present

## 2022-02-21 DIAGNOSIS — E119 Type 2 diabetes mellitus without complications: Secondary | ICD-10-CM | POA: Diagnosis not present

## 2022-02-21 DIAGNOSIS — I69351 Hemiplegia and hemiparesis following cerebral infarction affecting right dominant side: Secondary | ICD-10-CM | POA: Diagnosis not present

## 2022-02-21 DIAGNOSIS — R29898 Other symptoms and signs involving the musculoskeletal system: Secondary | ICD-10-CM | POA: Diagnosis not present

## 2022-02-21 DIAGNOSIS — M4807 Spinal stenosis, lumbosacral region: Secondary | ICD-10-CM | POA: Diagnosis not present

## 2022-02-21 DIAGNOSIS — M5136 Other intervertebral disc degeneration, lumbar region: Secondary | ICD-10-CM | POA: Diagnosis not present

## 2022-02-22 DIAGNOSIS — E119 Type 2 diabetes mellitus without complications: Secondary | ICD-10-CM | POA: Diagnosis not present

## 2022-02-22 DIAGNOSIS — M5134 Other intervertebral disc degeneration, thoracic region: Secondary | ICD-10-CM | POA: Diagnosis not present

## 2022-02-22 DIAGNOSIS — M5030 Other cervical disc degeneration, unspecified cervical region: Secondary | ICD-10-CM | POA: Diagnosis not present

## 2022-02-22 DIAGNOSIS — G959 Disease of spinal cord, unspecified: Secondary | ICD-10-CM | POA: Diagnosis not present

## 2022-02-22 DIAGNOSIS — R531 Weakness: Secondary | ICD-10-CM | POA: Diagnosis not present

## 2022-02-22 DIAGNOSIS — M4807 Spinal stenosis, lumbosacral region: Secondary | ICD-10-CM | POA: Diagnosis not present

## 2022-02-22 DIAGNOSIS — I10 Essential (primary) hypertension: Secondary | ICD-10-CM | POA: Diagnosis not present

## 2022-02-22 DIAGNOSIS — R29898 Other symptoms and signs involving the musculoskeletal system: Secondary | ICD-10-CM | POA: Diagnosis not present

## 2022-02-22 DIAGNOSIS — M5136 Other intervertebral disc degeneration, lumbar region: Secondary | ICD-10-CM | POA: Diagnosis not present

## 2022-02-22 DIAGNOSIS — I69351 Hemiplegia and hemiparesis following cerebral infarction affecting right dominant side: Secondary | ICD-10-CM | POA: Diagnosis not present

## 2022-02-24 DIAGNOSIS — G959 Disease of spinal cord, unspecified: Secondary | ICD-10-CM | POA: Diagnosis not present

## 2022-02-27 DIAGNOSIS — G959 Disease of spinal cord, unspecified: Secondary | ICD-10-CM | POA: Diagnosis not present

## 2022-02-27 DIAGNOSIS — M5136 Other intervertebral disc degeneration, lumbar region: Secondary | ICD-10-CM | POA: Diagnosis not present

## 2022-02-27 DIAGNOSIS — R531 Weakness: Secondary | ICD-10-CM | POA: Diagnosis not present

## 2022-02-27 DIAGNOSIS — M5134 Other intervertebral disc degeneration, thoracic region: Secondary | ICD-10-CM | POA: Diagnosis not present

## 2022-02-27 DIAGNOSIS — I69351 Hemiplegia and hemiparesis following cerebral infarction affecting right dominant side: Secondary | ICD-10-CM | POA: Diagnosis not present

## 2022-02-27 DIAGNOSIS — R29898 Other symptoms and signs involving the musculoskeletal system: Secondary | ICD-10-CM | POA: Diagnosis not present

## 2022-02-27 DIAGNOSIS — M4807 Spinal stenosis, lumbosacral region: Secondary | ICD-10-CM | POA: Diagnosis not present

## 2022-02-27 DIAGNOSIS — I10 Essential (primary) hypertension: Secondary | ICD-10-CM | POA: Diagnosis not present

## 2022-02-27 DIAGNOSIS — M5030 Other cervical disc degeneration, unspecified cervical region: Secondary | ICD-10-CM | POA: Diagnosis not present

## 2022-02-27 DIAGNOSIS — E119 Type 2 diabetes mellitus without complications: Secondary | ICD-10-CM | POA: Diagnosis not present

## 2022-02-28 DIAGNOSIS — R531 Weakness: Secondary | ICD-10-CM | POA: Diagnosis not present

## 2022-02-28 DIAGNOSIS — R29898 Other symptoms and signs involving the musculoskeletal system: Secondary | ICD-10-CM | POA: Diagnosis not present

## 2022-02-28 DIAGNOSIS — I69351 Hemiplegia and hemiparesis following cerebral infarction affecting right dominant side: Secondary | ICD-10-CM | POA: Diagnosis not present

## 2022-02-28 DIAGNOSIS — I10 Essential (primary) hypertension: Secondary | ICD-10-CM | POA: Diagnosis not present

## 2022-02-28 DIAGNOSIS — M4807 Spinal stenosis, lumbosacral region: Secondary | ICD-10-CM | POA: Diagnosis not present

## 2022-02-28 DIAGNOSIS — M5134 Other intervertebral disc degeneration, thoracic region: Secondary | ICD-10-CM | POA: Diagnosis not present

## 2022-02-28 DIAGNOSIS — M5030 Other cervical disc degeneration, unspecified cervical region: Secondary | ICD-10-CM | POA: Diagnosis not present

## 2022-02-28 DIAGNOSIS — E119 Type 2 diabetes mellitus without complications: Secondary | ICD-10-CM | POA: Diagnosis not present

## 2022-02-28 DIAGNOSIS — M5136 Other intervertebral disc degeneration, lumbar region: Secondary | ICD-10-CM | POA: Diagnosis not present

## 2022-02-28 DIAGNOSIS — G959 Disease of spinal cord, unspecified: Secondary | ICD-10-CM | POA: Diagnosis not present

## 2022-03-01 DIAGNOSIS — R531 Weakness: Secondary | ICD-10-CM | POA: Diagnosis not present

## 2022-03-01 DIAGNOSIS — I69351 Hemiplegia and hemiparesis following cerebral infarction affecting right dominant side: Secondary | ICD-10-CM | POA: Diagnosis not present

## 2022-03-01 DIAGNOSIS — M5134 Other intervertebral disc degeneration, thoracic region: Secondary | ICD-10-CM | POA: Diagnosis not present

## 2022-03-01 DIAGNOSIS — M5136 Other intervertebral disc degeneration, lumbar region: Secondary | ICD-10-CM | POA: Diagnosis not present

## 2022-03-01 DIAGNOSIS — I10 Essential (primary) hypertension: Secondary | ICD-10-CM | POA: Diagnosis not present

## 2022-03-01 DIAGNOSIS — M5030 Other cervical disc degeneration, unspecified cervical region: Secondary | ICD-10-CM | POA: Diagnosis not present

## 2022-03-01 DIAGNOSIS — R29898 Other symptoms and signs involving the musculoskeletal system: Secondary | ICD-10-CM | POA: Diagnosis not present

## 2022-03-01 DIAGNOSIS — M4807 Spinal stenosis, lumbosacral region: Secondary | ICD-10-CM | POA: Diagnosis not present

## 2022-03-01 DIAGNOSIS — E119 Type 2 diabetes mellitus without complications: Secondary | ICD-10-CM | POA: Diagnosis not present

## 2022-03-01 DIAGNOSIS — G959 Disease of spinal cord, unspecified: Secondary | ICD-10-CM | POA: Diagnosis not present

## 2022-03-08 DIAGNOSIS — M4807 Spinal stenosis, lumbosacral region: Secondary | ICD-10-CM | POA: Diagnosis not present

## 2022-03-08 DIAGNOSIS — M5030 Other cervical disc degeneration, unspecified cervical region: Secondary | ICD-10-CM | POA: Diagnosis not present

## 2022-03-08 DIAGNOSIS — I69351 Hemiplegia and hemiparesis following cerebral infarction affecting right dominant side: Secondary | ICD-10-CM | POA: Diagnosis not present

## 2022-03-08 DIAGNOSIS — M5136 Other intervertebral disc degeneration, lumbar region: Secondary | ICD-10-CM | POA: Diagnosis not present

## 2022-03-08 DIAGNOSIS — R29898 Other symptoms and signs involving the musculoskeletal system: Secondary | ICD-10-CM | POA: Diagnosis not present

## 2022-03-08 DIAGNOSIS — I10 Essential (primary) hypertension: Secondary | ICD-10-CM | POA: Diagnosis not present

## 2022-03-08 DIAGNOSIS — R531 Weakness: Secondary | ICD-10-CM | POA: Diagnosis not present

## 2022-03-08 DIAGNOSIS — M5134 Other intervertebral disc degeneration, thoracic region: Secondary | ICD-10-CM | POA: Diagnosis not present

## 2022-03-08 DIAGNOSIS — G959 Disease of spinal cord, unspecified: Secondary | ICD-10-CM | POA: Diagnosis not present

## 2022-03-08 DIAGNOSIS — E119 Type 2 diabetes mellitus without complications: Secondary | ICD-10-CM | POA: Diagnosis not present

## 2022-03-09 DIAGNOSIS — E119 Type 2 diabetes mellitus without complications: Secondary | ICD-10-CM | POA: Diagnosis not present

## 2022-03-09 DIAGNOSIS — I69351 Hemiplegia and hemiparesis following cerebral infarction affecting right dominant side: Secondary | ICD-10-CM | POA: Diagnosis not present

## 2022-03-09 DIAGNOSIS — M4807 Spinal stenosis, lumbosacral region: Secondary | ICD-10-CM | POA: Diagnosis not present

## 2022-03-09 DIAGNOSIS — M5030 Other cervical disc degeneration, unspecified cervical region: Secondary | ICD-10-CM | POA: Diagnosis not present

## 2022-03-09 DIAGNOSIS — R531 Weakness: Secondary | ICD-10-CM | POA: Diagnosis not present

## 2022-03-09 DIAGNOSIS — M5134 Other intervertebral disc degeneration, thoracic region: Secondary | ICD-10-CM | POA: Diagnosis not present

## 2022-03-09 DIAGNOSIS — G959 Disease of spinal cord, unspecified: Secondary | ICD-10-CM | POA: Diagnosis not present

## 2022-03-09 DIAGNOSIS — I10 Essential (primary) hypertension: Secondary | ICD-10-CM | POA: Diagnosis not present

## 2022-03-09 DIAGNOSIS — M5136 Other intervertebral disc degeneration, lumbar region: Secondary | ICD-10-CM | POA: Diagnosis not present

## 2022-03-09 DIAGNOSIS — R29898 Other symptoms and signs involving the musculoskeletal system: Secondary | ICD-10-CM | POA: Diagnosis not present

## 2022-03-13 DIAGNOSIS — R29898 Other symptoms and signs involving the musculoskeletal system: Secondary | ICD-10-CM | POA: Diagnosis not present

## 2022-03-13 DIAGNOSIS — E119 Type 2 diabetes mellitus without complications: Secondary | ICD-10-CM | POA: Diagnosis not present

## 2022-03-13 DIAGNOSIS — M5136 Other intervertebral disc degeneration, lumbar region: Secondary | ICD-10-CM | POA: Diagnosis not present

## 2022-03-13 DIAGNOSIS — M5134 Other intervertebral disc degeneration, thoracic region: Secondary | ICD-10-CM | POA: Diagnosis not present

## 2022-03-13 DIAGNOSIS — M4807 Spinal stenosis, lumbosacral region: Secondary | ICD-10-CM | POA: Diagnosis not present

## 2022-03-13 DIAGNOSIS — I69351 Hemiplegia and hemiparesis following cerebral infarction affecting right dominant side: Secondary | ICD-10-CM | POA: Diagnosis not present

## 2022-03-13 DIAGNOSIS — R531 Weakness: Secondary | ICD-10-CM | POA: Diagnosis not present

## 2022-03-13 DIAGNOSIS — M5030 Other cervical disc degeneration, unspecified cervical region: Secondary | ICD-10-CM | POA: Diagnosis not present

## 2022-03-13 DIAGNOSIS — G959 Disease of spinal cord, unspecified: Secondary | ICD-10-CM | POA: Diagnosis not present

## 2022-03-13 DIAGNOSIS — I10 Essential (primary) hypertension: Secondary | ICD-10-CM | POA: Diagnosis not present

## 2022-03-15 DIAGNOSIS — M5136 Other intervertebral disc degeneration, lumbar region: Secondary | ICD-10-CM | POA: Diagnosis not present

## 2022-03-15 DIAGNOSIS — E119 Type 2 diabetes mellitus without complications: Secondary | ICD-10-CM | POA: Diagnosis not present

## 2022-03-15 DIAGNOSIS — M5030 Other cervical disc degeneration, unspecified cervical region: Secondary | ICD-10-CM | POA: Diagnosis not present

## 2022-03-15 DIAGNOSIS — M4807 Spinal stenosis, lumbosacral region: Secondary | ICD-10-CM | POA: Diagnosis not present

## 2022-03-15 DIAGNOSIS — R29898 Other symptoms and signs involving the musculoskeletal system: Secondary | ICD-10-CM | POA: Diagnosis not present

## 2022-03-15 DIAGNOSIS — M5134 Other intervertebral disc degeneration, thoracic region: Secondary | ICD-10-CM | POA: Diagnosis not present

## 2022-03-15 DIAGNOSIS — G959 Disease of spinal cord, unspecified: Secondary | ICD-10-CM | POA: Diagnosis not present

## 2022-03-15 DIAGNOSIS — I10 Essential (primary) hypertension: Secondary | ICD-10-CM | POA: Diagnosis not present

## 2022-03-15 DIAGNOSIS — R531 Weakness: Secondary | ICD-10-CM | POA: Diagnosis not present

## 2022-03-15 DIAGNOSIS — I69351 Hemiplegia and hemiparesis following cerebral infarction affecting right dominant side: Secondary | ICD-10-CM | POA: Diagnosis not present

## 2022-03-19 DIAGNOSIS — I69351 Hemiplegia and hemiparesis following cerebral infarction affecting right dominant side: Secondary | ICD-10-CM | POA: Diagnosis not present

## 2022-03-19 DIAGNOSIS — I10 Essential (primary) hypertension: Secondary | ICD-10-CM | POA: Diagnosis not present

## 2022-03-19 DIAGNOSIS — M5136 Other intervertebral disc degeneration, lumbar region: Secondary | ICD-10-CM | POA: Diagnosis not present

## 2022-03-19 DIAGNOSIS — E119 Type 2 diabetes mellitus without complications: Secondary | ICD-10-CM | POA: Diagnosis not present

## 2022-03-19 DIAGNOSIS — M4807 Spinal stenosis, lumbosacral region: Secondary | ICD-10-CM | POA: Diagnosis not present

## 2022-03-19 DIAGNOSIS — G959 Disease of spinal cord, unspecified: Secondary | ICD-10-CM | POA: Diagnosis not present

## 2022-03-19 DIAGNOSIS — R29898 Other symptoms and signs involving the musculoskeletal system: Secondary | ICD-10-CM | POA: Diagnosis not present

## 2022-03-19 DIAGNOSIS — M5030 Other cervical disc degeneration, unspecified cervical region: Secondary | ICD-10-CM | POA: Diagnosis not present

## 2022-03-19 DIAGNOSIS — R531 Weakness: Secondary | ICD-10-CM | POA: Diagnosis not present

## 2022-03-19 DIAGNOSIS — M5134 Other intervertebral disc degeneration, thoracic region: Secondary | ICD-10-CM | POA: Diagnosis not present

## 2022-03-21 DIAGNOSIS — M5136 Other intervertebral disc degeneration, lumbar region: Secondary | ICD-10-CM | POA: Diagnosis not present

## 2022-03-21 DIAGNOSIS — R531 Weakness: Secondary | ICD-10-CM | POA: Diagnosis not present

## 2022-03-21 DIAGNOSIS — M5030 Other cervical disc degeneration, unspecified cervical region: Secondary | ICD-10-CM | POA: Diagnosis not present

## 2022-03-21 DIAGNOSIS — I10 Essential (primary) hypertension: Secondary | ICD-10-CM | POA: Diagnosis not present

## 2022-03-21 DIAGNOSIS — M5134 Other intervertebral disc degeneration, thoracic region: Secondary | ICD-10-CM | POA: Diagnosis not present

## 2022-03-21 DIAGNOSIS — G959 Disease of spinal cord, unspecified: Secondary | ICD-10-CM | POA: Diagnosis not present

## 2022-03-21 DIAGNOSIS — R29898 Other symptoms and signs involving the musculoskeletal system: Secondary | ICD-10-CM | POA: Diagnosis not present

## 2022-03-21 DIAGNOSIS — E119 Type 2 diabetes mellitus without complications: Secondary | ICD-10-CM | POA: Diagnosis not present

## 2022-03-21 DIAGNOSIS — I69351 Hemiplegia and hemiparesis following cerebral infarction affecting right dominant side: Secondary | ICD-10-CM | POA: Diagnosis not present

## 2022-03-21 DIAGNOSIS — M4807 Spinal stenosis, lumbosacral region: Secondary | ICD-10-CM | POA: Diagnosis not present

## 2022-03-27 DIAGNOSIS — I69351 Hemiplegia and hemiparesis following cerebral infarction affecting right dominant side: Secondary | ICD-10-CM | POA: Diagnosis not present

## 2022-03-27 DIAGNOSIS — E119 Type 2 diabetes mellitus without complications: Secondary | ICD-10-CM | POA: Diagnosis not present

## 2022-03-27 DIAGNOSIS — M4807 Spinal stenosis, lumbosacral region: Secondary | ICD-10-CM | POA: Diagnosis not present

## 2022-03-27 DIAGNOSIS — M5136 Other intervertebral disc degeneration, lumbar region: Secondary | ICD-10-CM | POA: Diagnosis not present

## 2022-03-27 DIAGNOSIS — G959 Disease of spinal cord, unspecified: Secondary | ICD-10-CM | POA: Diagnosis not present

## 2022-03-27 DIAGNOSIS — R531 Weakness: Secondary | ICD-10-CM | POA: Diagnosis not present

## 2022-03-27 DIAGNOSIS — I10 Essential (primary) hypertension: Secondary | ICD-10-CM | POA: Diagnosis not present

## 2022-03-27 DIAGNOSIS — M5030 Other cervical disc degeneration, unspecified cervical region: Secondary | ICD-10-CM | POA: Diagnosis not present

## 2022-03-27 DIAGNOSIS — M5134 Other intervertebral disc degeneration, thoracic region: Secondary | ICD-10-CM | POA: Diagnosis not present

## 2022-03-27 DIAGNOSIS — R29898 Other symptoms and signs involving the musculoskeletal system: Secondary | ICD-10-CM | POA: Diagnosis not present

## 2022-03-28 DIAGNOSIS — R29898 Other symptoms and signs involving the musculoskeletal system: Secondary | ICD-10-CM | POA: Diagnosis not present

## 2022-03-28 DIAGNOSIS — M5030 Other cervical disc degeneration, unspecified cervical region: Secondary | ICD-10-CM | POA: Diagnosis not present

## 2022-03-28 DIAGNOSIS — R531 Weakness: Secondary | ICD-10-CM | POA: Diagnosis not present

## 2022-03-28 DIAGNOSIS — M5136 Other intervertebral disc degeneration, lumbar region: Secondary | ICD-10-CM | POA: Diagnosis not present

## 2022-03-28 DIAGNOSIS — E119 Type 2 diabetes mellitus without complications: Secondary | ICD-10-CM | POA: Diagnosis not present

## 2022-03-28 DIAGNOSIS — G959 Disease of spinal cord, unspecified: Secondary | ICD-10-CM | POA: Diagnosis not present

## 2022-03-28 DIAGNOSIS — I69351 Hemiplegia and hemiparesis following cerebral infarction affecting right dominant side: Secondary | ICD-10-CM | POA: Diagnosis not present

## 2022-03-28 DIAGNOSIS — M4807 Spinal stenosis, lumbosacral region: Secondary | ICD-10-CM | POA: Diagnosis not present

## 2022-03-28 DIAGNOSIS — I10 Essential (primary) hypertension: Secondary | ICD-10-CM | POA: Diagnosis not present

## 2022-03-28 DIAGNOSIS — M5134 Other intervertebral disc degeneration, thoracic region: Secondary | ICD-10-CM | POA: Diagnosis not present

## 2022-04-03 DIAGNOSIS — R350 Frequency of micturition: Secondary | ICD-10-CM | POA: Diagnosis not present

## 2022-04-04 DIAGNOSIS — N390 Urinary tract infection, site not specified: Secondary | ICD-10-CM | POA: Diagnosis not present

## 2022-04-19 DIAGNOSIS — R29898 Other symptoms and signs involving the musculoskeletal system: Secondary | ICD-10-CM | POA: Diagnosis not present

## 2022-04-19 DIAGNOSIS — I10 Essential (primary) hypertension: Secondary | ICD-10-CM | POA: Diagnosis not present

## 2022-04-19 DIAGNOSIS — M5136 Other intervertebral disc degeneration, lumbar region: Secondary | ICD-10-CM | POA: Diagnosis not present

## 2022-04-19 DIAGNOSIS — I69351 Hemiplegia and hemiparesis following cerebral infarction affecting right dominant side: Secondary | ICD-10-CM | POA: Diagnosis not present

## 2022-04-19 DIAGNOSIS — R531 Weakness: Secondary | ICD-10-CM | POA: Diagnosis not present

## 2022-04-19 DIAGNOSIS — M4807 Spinal stenosis, lumbosacral region: Secondary | ICD-10-CM | POA: Diagnosis not present

## 2022-04-19 DIAGNOSIS — E119 Type 2 diabetes mellitus without complications: Secondary | ICD-10-CM | POA: Diagnosis not present

## 2022-04-19 DIAGNOSIS — M5134 Other intervertebral disc degeneration, thoracic region: Secondary | ICD-10-CM | POA: Diagnosis not present

## 2022-04-19 DIAGNOSIS — M5030 Other cervical disc degeneration, unspecified cervical region: Secondary | ICD-10-CM | POA: Diagnosis not present

## 2022-04-19 DIAGNOSIS — G959 Disease of spinal cord, unspecified: Secondary | ICD-10-CM | POA: Diagnosis not present

## 2022-04-26 DIAGNOSIS — I10 Essential (primary) hypertension: Secondary | ICD-10-CM | POA: Diagnosis not present

## 2022-04-26 DIAGNOSIS — M4807 Spinal stenosis, lumbosacral region: Secondary | ICD-10-CM | POA: Diagnosis not present

## 2022-04-26 DIAGNOSIS — R531 Weakness: Secondary | ICD-10-CM | POA: Diagnosis not present

## 2022-04-26 DIAGNOSIS — G959 Disease of spinal cord, unspecified: Secondary | ICD-10-CM | POA: Diagnosis not present

## 2022-04-26 DIAGNOSIS — R29898 Other symptoms and signs involving the musculoskeletal system: Secondary | ICD-10-CM | POA: Diagnosis not present

## 2022-04-26 DIAGNOSIS — M5134 Other intervertebral disc degeneration, thoracic region: Secondary | ICD-10-CM | POA: Diagnosis not present

## 2022-04-26 DIAGNOSIS — I69351 Hemiplegia and hemiparesis following cerebral infarction affecting right dominant side: Secondary | ICD-10-CM | POA: Diagnosis not present

## 2022-04-26 DIAGNOSIS — M5030 Other cervical disc degeneration, unspecified cervical region: Secondary | ICD-10-CM | POA: Diagnosis not present

## 2022-04-26 DIAGNOSIS — M5136 Other intervertebral disc degeneration, lumbar region: Secondary | ICD-10-CM | POA: Diagnosis not present

## 2022-04-26 DIAGNOSIS — E119 Type 2 diabetes mellitus without complications: Secondary | ICD-10-CM | POA: Diagnosis not present

## 2022-05-01 DIAGNOSIS — M5030 Other cervical disc degeneration, unspecified cervical region: Secondary | ICD-10-CM | POA: Diagnosis not present

## 2022-05-01 DIAGNOSIS — G959 Disease of spinal cord, unspecified: Secondary | ICD-10-CM | POA: Diagnosis not present

## 2022-05-01 DIAGNOSIS — M4807 Spinal stenosis, lumbosacral region: Secondary | ICD-10-CM | POA: Diagnosis not present

## 2022-05-01 DIAGNOSIS — M5136 Other intervertebral disc degeneration, lumbar region: Secondary | ICD-10-CM | POA: Diagnosis not present

## 2022-05-01 DIAGNOSIS — I69351 Hemiplegia and hemiparesis following cerebral infarction affecting right dominant side: Secondary | ICD-10-CM | POA: Diagnosis not present

## 2022-05-01 DIAGNOSIS — R531 Weakness: Secondary | ICD-10-CM | POA: Diagnosis not present

## 2022-05-01 DIAGNOSIS — I10 Essential (primary) hypertension: Secondary | ICD-10-CM | POA: Diagnosis not present

## 2022-05-01 DIAGNOSIS — R29898 Other symptoms and signs involving the musculoskeletal system: Secondary | ICD-10-CM | POA: Diagnosis not present

## 2022-05-01 DIAGNOSIS — D509 Iron deficiency anemia, unspecified: Secondary | ICD-10-CM | POA: Diagnosis not present

## 2022-05-01 DIAGNOSIS — M5134 Other intervertebral disc degeneration, thoracic region: Secondary | ICD-10-CM | POA: Diagnosis not present

## 2022-05-07 DIAGNOSIS — M4807 Spinal stenosis, lumbosacral region: Secondary | ICD-10-CM | POA: Diagnosis not present

## 2022-05-07 DIAGNOSIS — I69351 Hemiplegia and hemiparesis following cerebral infarction affecting right dominant side: Secondary | ICD-10-CM | POA: Diagnosis not present

## 2022-05-07 DIAGNOSIS — M5134 Other intervertebral disc degeneration, thoracic region: Secondary | ICD-10-CM | POA: Diagnosis not present

## 2022-05-07 DIAGNOSIS — R29898 Other symptoms and signs involving the musculoskeletal system: Secondary | ICD-10-CM | POA: Diagnosis not present

## 2022-05-07 DIAGNOSIS — R531 Weakness: Secondary | ICD-10-CM | POA: Diagnosis not present

## 2022-05-07 DIAGNOSIS — M5136 Other intervertebral disc degeneration, lumbar region: Secondary | ICD-10-CM | POA: Diagnosis not present

## 2022-05-07 DIAGNOSIS — M5030 Other cervical disc degeneration, unspecified cervical region: Secondary | ICD-10-CM | POA: Diagnosis not present

## 2022-05-07 DIAGNOSIS — I10 Essential (primary) hypertension: Secondary | ICD-10-CM | POA: Diagnosis not present

## 2022-05-07 DIAGNOSIS — G959 Disease of spinal cord, unspecified: Secondary | ICD-10-CM | POA: Diagnosis not present

## 2022-05-07 DIAGNOSIS — D509 Iron deficiency anemia, unspecified: Secondary | ICD-10-CM | POA: Diagnosis not present

## 2022-05-09 DIAGNOSIS — M5030 Other cervical disc degeneration, unspecified cervical region: Secondary | ICD-10-CM | POA: Diagnosis not present

## 2022-05-09 DIAGNOSIS — M4807 Spinal stenosis, lumbosacral region: Secondary | ICD-10-CM | POA: Diagnosis not present

## 2022-05-09 DIAGNOSIS — I10 Essential (primary) hypertension: Secondary | ICD-10-CM | POA: Diagnosis not present

## 2022-05-09 DIAGNOSIS — I69351 Hemiplegia and hemiparesis following cerebral infarction affecting right dominant side: Secondary | ICD-10-CM | POA: Diagnosis not present

## 2022-05-09 DIAGNOSIS — M5136 Other intervertebral disc degeneration, lumbar region: Secondary | ICD-10-CM | POA: Diagnosis not present

## 2022-05-09 DIAGNOSIS — M5134 Other intervertebral disc degeneration, thoracic region: Secondary | ICD-10-CM | POA: Diagnosis not present

## 2022-05-09 DIAGNOSIS — G959 Disease of spinal cord, unspecified: Secondary | ICD-10-CM | POA: Diagnosis not present

## 2022-05-09 DIAGNOSIS — D509 Iron deficiency anemia, unspecified: Secondary | ICD-10-CM | POA: Diagnosis not present

## 2022-05-09 DIAGNOSIS — R531 Weakness: Secondary | ICD-10-CM | POA: Diagnosis not present

## 2022-05-09 DIAGNOSIS — R29898 Other symptoms and signs involving the musculoskeletal system: Secondary | ICD-10-CM | POA: Diagnosis not present

## 2022-05-14 DIAGNOSIS — I10 Essential (primary) hypertension: Secondary | ICD-10-CM | POA: Diagnosis not present

## 2022-05-14 DIAGNOSIS — G959 Disease of spinal cord, unspecified: Secondary | ICD-10-CM | POA: Diagnosis not present

## 2022-05-14 DIAGNOSIS — M5134 Other intervertebral disc degeneration, thoracic region: Secondary | ICD-10-CM | POA: Diagnosis not present

## 2022-05-14 DIAGNOSIS — M4807 Spinal stenosis, lumbosacral region: Secondary | ICD-10-CM | POA: Diagnosis not present

## 2022-05-14 DIAGNOSIS — R29898 Other symptoms and signs involving the musculoskeletal system: Secondary | ICD-10-CM | POA: Diagnosis not present

## 2022-05-14 DIAGNOSIS — M5136 Other intervertebral disc degeneration, lumbar region: Secondary | ICD-10-CM | POA: Diagnosis not present

## 2022-05-14 DIAGNOSIS — R531 Weakness: Secondary | ICD-10-CM | POA: Diagnosis not present

## 2022-05-14 DIAGNOSIS — I69351 Hemiplegia and hemiparesis following cerebral infarction affecting right dominant side: Secondary | ICD-10-CM | POA: Diagnosis not present

## 2022-05-14 DIAGNOSIS — M5030 Other cervical disc degeneration, unspecified cervical region: Secondary | ICD-10-CM | POA: Diagnosis not present

## 2022-05-14 DIAGNOSIS — D509 Iron deficiency anemia, unspecified: Secondary | ICD-10-CM | POA: Diagnosis not present

## 2022-05-16 DIAGNOSIS — G959 Disease of spinal cord, unspecified: Secondary | ICD-10-CM | POA: Diagnosis not present

## 2022-05-16 DIAGNOSIS — R29898 Other symptoms and signs involving the musculoskeletal system: Secondary | ICD-10-CM | POA: Diagnosis not present

## 2022-05-16 DIAGNOSIS — M5134 Other intervertebral disc degeneration, thoracic region: Secondary | ICD-10-CM | POA: Diagnosis not present

## 2022-05-16 DIAGNOSIS — M5030 Other cervical disc degeneration, unspecified cervical region: Secondary | ICD-10-CM | POA: Diagnosis not present

## 2022-05-16 DIAGNOSIS — M5136 Other intervertebral disc degeneration, lumbar region: Secondary | ICD-10-CM | POA: Diagnosis not present

## 2022-05-16 DIAGNOSIS — D509 Iron deficiency anemia, unspecified: Secondary | ICD-10-CM | POA: Diagnosis not present

## 2022-05-16 DIAGNOSIS — M4807 Spinal stenosis, lumbosacral region: Secondary | ICD-10-CM | POA: Diagnosis not present

## 2022-05-16 DIAGNOSIS — I10 Essential (primary) hypertension: Secondary | ICD-10-CM | POA: Diagnosis not present

## 2022-05-16 DIAGNOSIS — R531 Weakness: Secondary | ICD-10-CM | POA: Diagnosis not present

## 2022-05-16 DIAGNOSIS — I69351 Hemiplegia and hemiparesis following cerebral infarction affecting right dominant side: Secondary | ICD-10-CM | POA: Diagnosis not present

## 2022-05-21 DIAGNOSIS — I10 Essential (primary) hypertension: Secondary | ICD-10-CM | POA: Diagnosis not present

## 2022-05-21 DIAGNOSIS — G959 Disease of spinal cord, unspecified: Secondary | ICD-10-CM | POA: Diagnosis not present

## 2022-05-21 DIAGNOSIS — D509 Iron deficiency anemia, unspecified: Secondary | ICD-10-CM | POA: Diagnosis not present

## 2022-05-21 DIAGNOSIS — R531 Weakness: Secondary | ICD-10-CM | POA: Diagnosis not present

## 2022-05-21 DIAGNOSIS — M5030 Other cervical disc degeneration, unspecified cervical region: Secondary | ICD-10-CM | POA: Diagnosis not present

## 2022-05-21 DIAGNOSIS — R29898 Other symptoms and signs involving the musculoskeletal system: Secondary | ICD-10-CM | POA: Diagnosis not present

## 2022-05-21 DIAGNOSIS — I69351 Hemiplegia and hemiparesis following cerebral infarction affecting right dominant side: Secondary | ICD-10-CM | POA: Diagnosis not present

## 2022-05-21 DIAGNOSIS — M5134 Other intervertebral disc degeneration, thoracic region: Secondary | ICD-10-CM | POA: Diagnosis not present

## 2022-05-21 DIAGNOSIS — M5136 Other intervertebral disc degeneration, lumbar region: Secondary | ICD-10-CM | POA: Diagnosis not present

## 2022-05-21 DIAGNOSIS — M4807 Spinal stenosis, lumbosacral region: Secondary | ICD-10-CM | POA: Diagnosis not present

## 2022-05-25 DIAGNOSIS — G959 Disease of spinal cord, unspecified: Secondary | ICD-10-CM | POA: Diagnosis not present

## 2022-05-25 DIAGNOSIS — M5030 Other cervical disc degeneration, unspecified cervical region: Secondary | ICD-10-CM | POA: Diagnosis not present

## 2022-05-25 DIAGNOSIS — R531 Weakness: Secondary | ICD-10-CM | POA: Diagnosis not present

## 2022-05-25 DIAGNOSIS — I69351 Hemiplegia and hemiparesis following cerebral infarction affecting right dominant side: Secondary | ICD-10-CM | POA: Diagnosis not present

## 2022-05-25 DIAGNOSIS — R29898 Other symptoms and signs involving the musculoskeletal system: Secondary | ICD-10-CM | POA: Diagnosis not present

## 2022-05-25 DIAGNOSIS — M4807 Spinal stenosis, lumbosacral region: Secondary | ICD-10-CM | POA: Diagnosis not present

## 2022-05-25 DIAGNOSIS — I10 Essential (primary) hypertension: Secondary | ICD-10-CM | POA: Diagnosis not present

## 2022-05-25 DIAGNOSIS — M5136 Other intervertebral disc degeneration, lumbar region: Secondary | ICD-10-CM | POA: Diagnosis not present

## 2022-05-25 DIAGNOSIS — M5134 Other intervertebral disc degeneration, thoracic region: Secondary | ICD-10-CM | POA: Diagnosis not present

## 2022-05-25 DIAGNOSIS — D509 Iron deficiency anemia, unspecified: Secondary | ICD-10-CM | POA: Diagnosis not present

## 2022-05-27 DIAGNOSIS — G959 Disease of spinal cord, unspecified: Secondary | ICD-10-CM | POA: Diagnosis not present

## 2022-05-28 DIAGNOSIS — M4807 Spinal stenosis, lumbosacral region: Secondary | ICD-10-CM | POA: Diagnosis not present

## 2022-05-28 DIAGNOSIS — I10 Essential (primary) hypertension: Secondary | ICD-10-CM | POA: Diagnosis not present

## 2022-05-28 DIAGNOSIS — D509 Iron deficiency anemia, unspecified: Secondary | ICD-10-CM | POA: Diagnosis not present

## 2022-05-28 DIAGNOSIS — I69351 Hemiplegia and hemiparesis following cerebral infarction affecting right dominant side: Secondary | ICD-10-CM | POA: Diagnosis not present

## 2022-05-28 DIAGNOSIS — M5136 Other intervertebral disc degeneration, lumbar region: Secondary | ICD-10-CM | POA: Diagnosis not present

## 2022-05-28 DIAGNOSIS — R29898 Other symptoms and signs involving the musculoskeletal system: Secondary | ICD-10-CM | POA: Diagnosis not present

## 2022-05-28 DIAGNOSIS — R531 Weakness: Secondary | ICD-10-CM | POA: Diagnosis not present

## 2022-05-28 DIAGNOSIS — M5030 Other cervical disc degeneration, unspecified cervical region: Secondary | ICD-10-CM | POA: Diagnosis not present

## 2022-05-28 DIAGNOSIS — G959 Disease of spinal cord, unspecified: Secondary | ICD-10-CM | POA: Diagnosis not present

## 2022-05-28 DIAGNOSIS — M5134 Other intervertebral disc degeneration, thoracic region: Secondary | ICD-10-CM | POA: Diagnosis not present

## 2022-05-30 DIAGNOSIS — R29898 Other symptoms and signs involving the musculoskeletal system: Secondary | ICD-10-CM | POA: Diagnosis not present

## 2022-05-30 DIAGNOSIS — G959 Disease of spinal cord, unspecified: Secondary | ICD-10-CM | POA: Diagnosis not present

## 2022-05-30 DIAGNOSIS — M5134 Other intervertebral disc degeneration, thoracic region: Secondary | ICD-10-CM | POA: Diagnosis not present

## 2022-05-30 DIAGNOSIS — M5136 Other intervertebral disc degeneration, lumbar region: Secondary | ICD-10-CM | POA: Diagnosis not present

## 2022-05-30 DIAGNOSIS — M5030 Other cervical disc degeneration, unspecified cervical region: Secondary | ICD-10-CM | POA: Diagnosis not present

## 2022-05-30 DIAGNOSIS — I69351 Hemiplegia and hemiparesis following cerebral infarction affecting right dominant side: Secondary | ICD-10-CM | POA: Diagnosis not present

## 2022-05-30 DIAGNOSIS — D509 Iron deficiency anemia, unspecified: Secondary | ICD-10-CM | POA: Diagnosis not present

## 2022-05-30 DIAGNOSIS — M4807 Spinal stenosis, lumbosacral region: Secondary | ICD-10-CM | POA: Diagnosis not present

## 2022-05-30 DIAGNOSIS — R531 Weakness: Secondary | ICD-10-CM | POA: Diagnosis not present

## 2022-05-30 DIAGNOSIS — I10 Essential (primary) hypertension: Secondary | ICD-10-CM | POA: Diagnosis not present

## 2022-06-03 IMAGING — MR MR THORACIC SPINE W/O CM
8 of 12 series · 15 of 48 positions shown · non-contrast
Comparison: Prior thoracic spine MRI examinations 11/23/2021 and
11/21/2021.

CLINICAL DATA: Provided history: Myelopathy, acute, thoracic spine.

EXAM:
MRI THORACIC SPINE WITHOUT CONTRAST
TECHNIQUE: Multiplanar, multisequence MR imaging of the thoracic spine was
performed. No intravenous contrast was administered.

[Series 2: T1 · sagittal · 3.0mm · 0.90mm/px · 1 of 12 slices shown (1 of 3)]
[im 1/12]
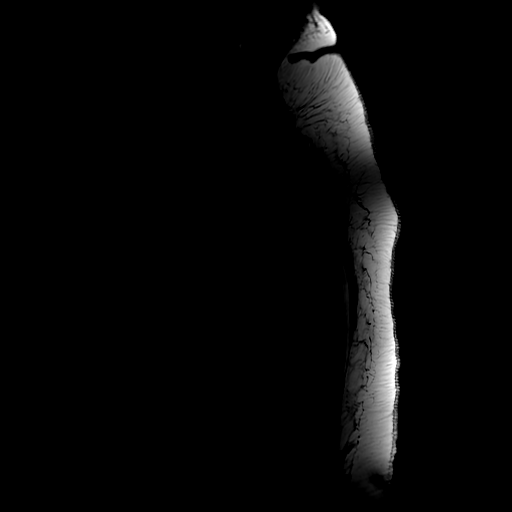

[Series 3: T2 · sagittal · 3.0mm · 0.43mm/px · 1 of 15 slices shown (1 of 3)]
[im 1/15]
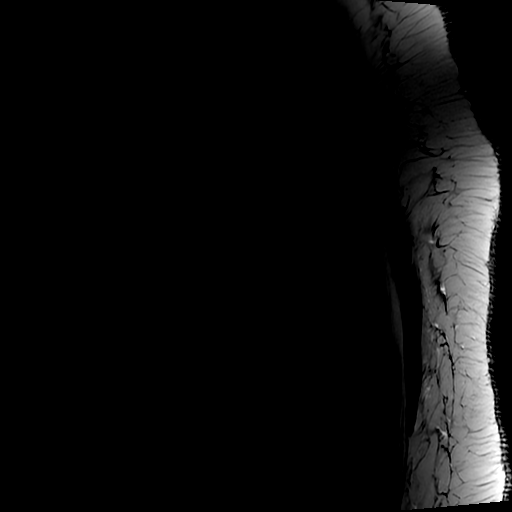

[Series 4: STIR · sagittal · 3.0mm · 0.43mm/px · 2 of 15 slices shown]
[im 1/15]
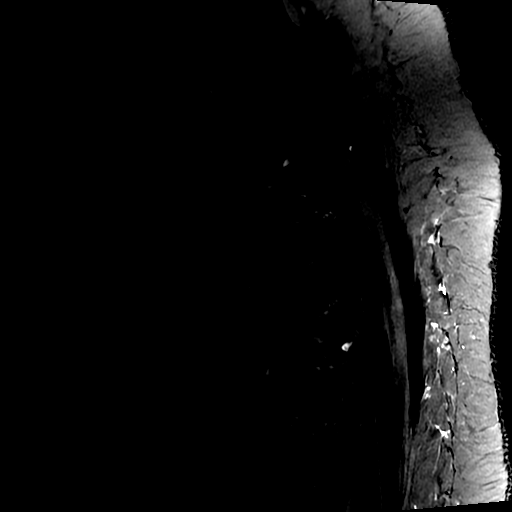
[im 15/15]
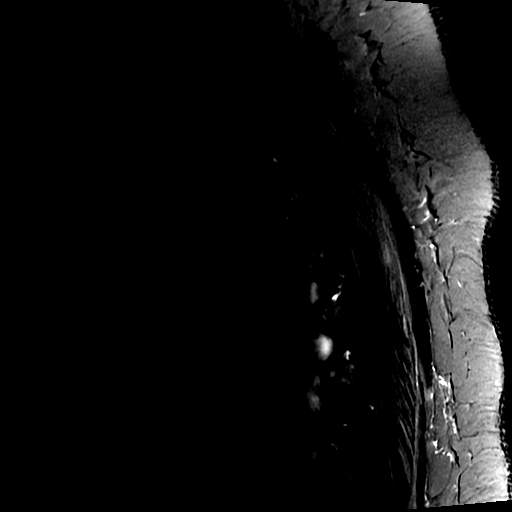

[Series 5: T1 · sagittal · 3.0mm · 0.43mm/px · 2 of 15 slices shown (2 of 3)]
[im 1/15]
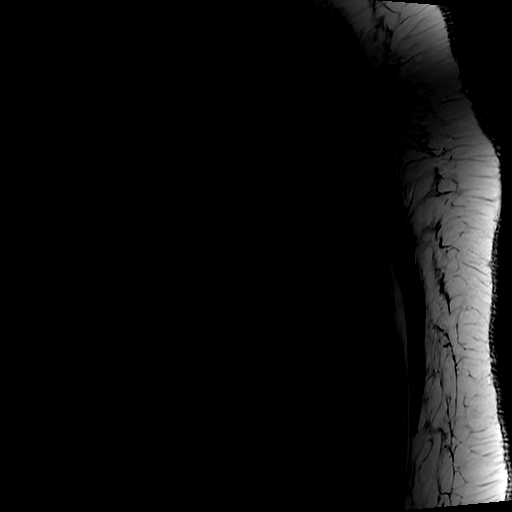
[im 15/15]
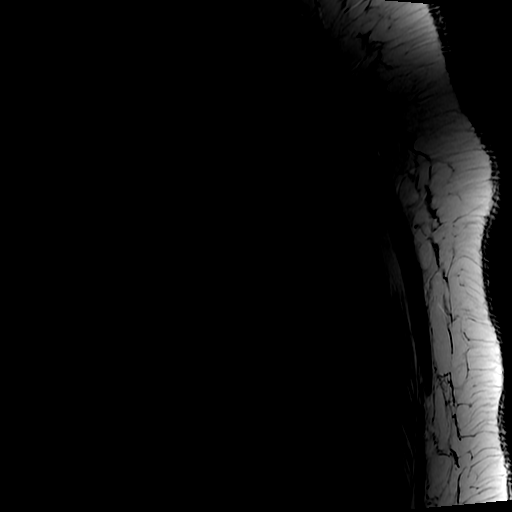

[Series 6: T2 · axial · 4.0mm · 0.39mm/px · z∈[-65,+7]mm · 2 of 15 slices shown (2 of 3)]
[im 1/15]
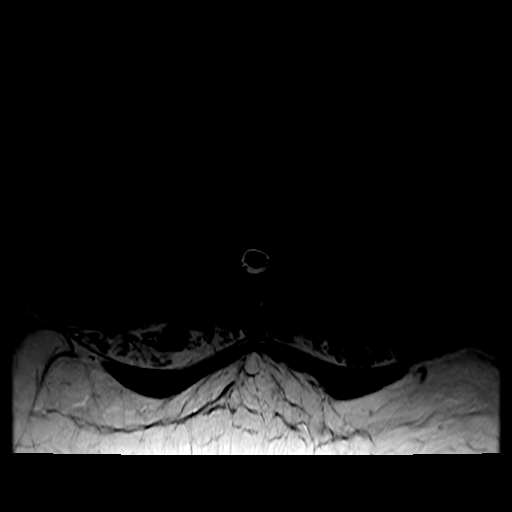
[im 15/15]
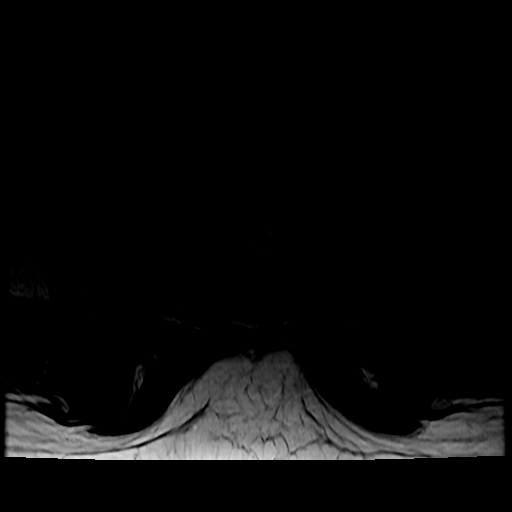

[Series 7: ax 2d merge · axial · 4.0mm · 0.39mm/px · z∈[-65,+7]mm · 2 of 15 slices shown]
[im 1/15]
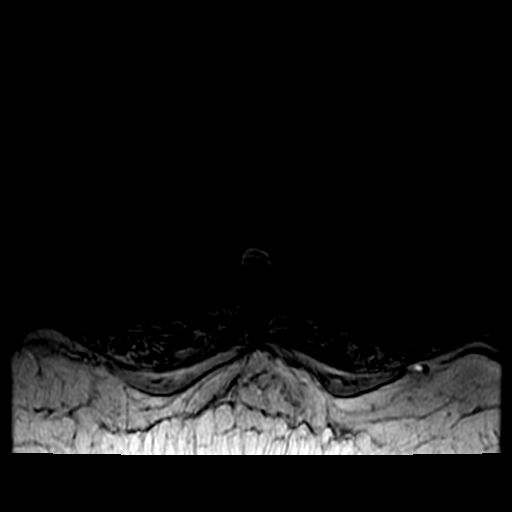
[im 15/15]
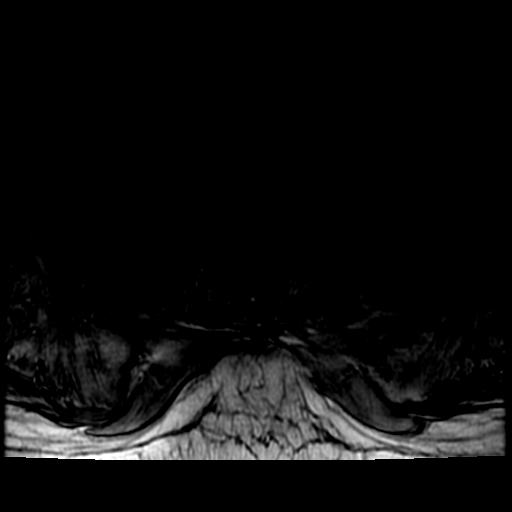

[Series 13: T1 · axial · non-contrast · 4.0mm · 0.39mm/px · z∈[-65,+7]mm · 2 of 15 slices shown (3 of 3)]
[im 1/15]
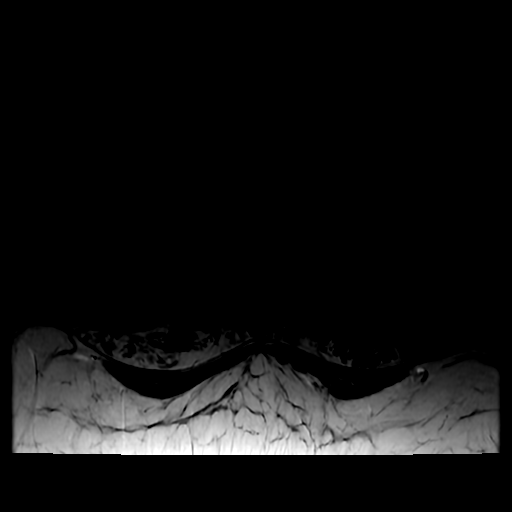
[im 15/15]
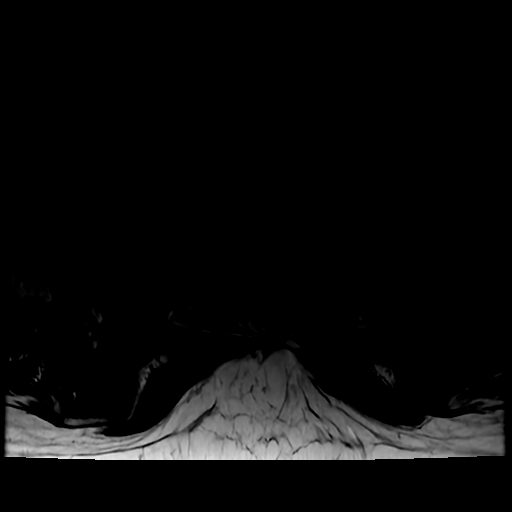

[Series 16: T2 · axial · 3.0mm · 0.35mm/px · z∈[-65,+4]mm · 3 of 26 slices shown (3 of 3)]
[im 1/26]
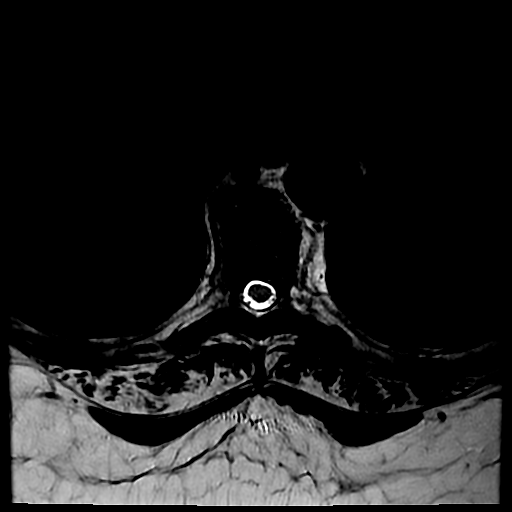
[im 13/26]
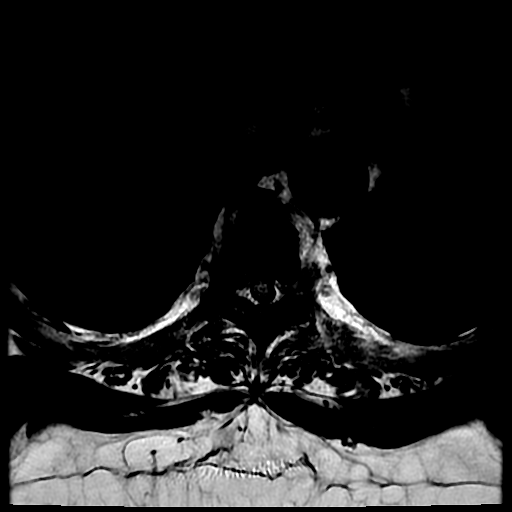
[im 26/26]
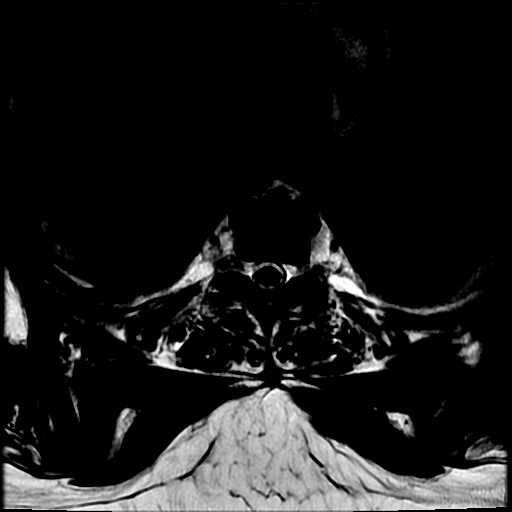

[15 of 48 positions shown; findings below may reference images not displayed]

FINDINGS: A problem-oriented repeat thoracic spine MRI was performed to better
assess sites of stenosis within the upper thoracic spinal canal. The
current examination spans the C6-T10 levels on sagittal imaging. The
T2-T3, T3-T4, T4-T5 and T5-T6 levels are included on axial imaging.
In addition to routine sequences, multiple high-resolution 3D
T2-weighted sequences were also performed.

Alignment:  No significant spondylolisthesis.

Vertebrae: No thoracic vertebral compression fracture. Multilevel
degenerative endplate irregularity, greatest at T3-T4 and T4-T5.
Apparent solid bridging anterior osteophytes at the T5-T6, T6-T7,
T7-T8 and T9-T10 levels. 2.2 cm hemangioma within the right aspect
of the T3 vertebral body.

Cord: No definite spinal cord signal abnormality is appreciated.
Spinal cord flattening at T3-T4 and T4-T5, as described below.

Paraspinal and other soft tissues: No abnormality identified within
included portions of the thorax. Paraspinal soft tissues
unremarkable.

Disc levels:

Multilevel disc space narrowing. Most notably, disc space narrowing
is advanced at T2-T3, T3-T4, T5-T6, T6-T7, T7-T8, T8-T9 and T9-T10.
Also of note, disc degeneration is moderate/advanced at T4-T5.

T1-T2: Imaged sagittally. Not included on axial imaging. Disc bulge
with endplate spurring. Superimposed small left center disc
protrusion. No more than mild spinal canal stenosis is suspected. No
compressive foraminal narrowing.

T2-T3: Disc bulge. Facet arthrosis. The disc bulge mildly effaces
the ventral thecal sac and may contact the ventral spinal cord. No
compressive foraminal stenosis.

T3-T4: Disc bulge with endplate spurring/osteophytic ridging. Facet
arthrosis. Moderate to moderately severe spinal canal stenosis with
contact upon the dorsal and ventral spinal cord and mild spinal cord
flattening. Bilateral neural foraminal narrowing (moderate right,
mild left).

T4-T5: Disc bulge with endplate spurring/osteophytic ridging. Facet
arthrosis. Moderate to moderately severe spinal canal stenosis with
contact upon the dorsal and ventral spinal cord and mild spinal cord
flattening. Mild/moderate bilateral neural foraminal narrowing.

T5-T6: Anterior bridging osteophyte. Facet arthrosis. No significant
spinal canal or foraminal stenosis.

The T6-T7 and more caudal thoracic levels are not included on axial
imaging. Please refer to the recent prior thoracic spine MRI of
11/23/2021 for a description of thoracic spondylosis at these more
caudal levels.
IMPRESSION: Problem-oriented MRI to better characterize sites of spinal canal
stenosis within the upper thoracic spine, as described. Upper
thoracic spondylosis, as outlined and with findings most notably as
follows.

At T3-T4, there is a disc bulge with osteophytic ridging. Facet
arthrosis. Resultant multifactorial moderate to moderately severe
spinal canal stenosis, with contact upon the dorsal and ventral
spinal cord, and mild spinal cord flattening. Bilateral neural
foraminal narrowing (moderate right, mild left).

At T4-T5, there is a disc bulge with osteophytic ridging. Facet
arthrosis. Resultant multifactorial moderate to moderately severe
spinal canal stenosis, with contact upon the dorsal and ventral
spinal cord, and mild spinal cord flattening. Mild/moderate
bilateral neural foraminal narrowing.

No more than mild spinal canal stenosis, and no compressive
foraminal stenosis, at the remaining upper thoracic levels.

Apparent solid bridging anterior osteophytes at the T5-T6 through
T9-T10 levels. Additional bridging anterior osteophytes were
identified more inferiorly within the thoracic spine on the recent
prior thoracic spine MRI of 11/23/2021.

## 2022-06-04 DIAGNOSIS — M5136 Other intervertebral disc degeneration, lumbar region: Secondary | ICD-10-CM | POA: Diagnosis not present

## 2022-06-04 DIAGNOSIS — R531 Weakness: Secondary | ICD-10-CM | POA: Diagnosis not present

## 2022-06-04 DIAGNOSIS — R29898 Other symptoms and signs involving the musculoskeletal system: Secondary | ICD-10-CM | POA: Diagnosis not present

## 2022-06-04 DIAGNOSIS — D509 Iron deficiency anemia, unspecified: Secondary | ICD-10-CM | POA: Diagnosis not present

## 2022-06-04 DIAGNOSIS — G959 Disease of spinal cord, unspecified: Secondary | ICD-10-CM | POA: Diagnosis not present

## 2022-06-04 DIAGNOSIS — M5134 Other intervertebral disc degeneration, thoracic region: Secondary | ICD-10-CM | POA: Diagnosis not present

## 2022-06-04 DIAGNOSIS — M5030 Other cervical disc degeneration, unspecified cervical region: Secondary | ICD-10-CM | POA: Diagnosis not present

## 2022-06-04 DIAGNOSIS — M4807 Spinal stenosis, lumbosacral region: Secondary | ICD-10-CM | POA: Diagnosis not present

## 2022-06-04 DIAGNOSIS — I10 Essential (primary) hypertension: Secondary | ICD-10-CM | POA: Diagnosis not present

## 2022-06-04 DIAGNOSIS — I69351 Hemiplegia and hemiparesis following cerebral infarction affecting right dominant side: Secondary | ICD-10-CM | POA: Diagnosis not present

## 2022-06-22 DIAGNOSIS — R531 Weakness: Secondary | ICD-10-CM | POA: Diagnosis not present

## 2022-06-22 DIAGNOSIS — D509 Iron deficiency anemia, unspecified: Secondary | ICD-10-CM | POA: Diagnosis not present

## 2022-06-22 DIAGNOSIS — M4807 Spinal stenosis, lumbosacral region: Secondary | ICD-10-CM | POA: Diagnosis not present

## 2022-06-22 DIAGNOSIS — I10 Essential (primary) hypertension: Secondary | ICD-10-CM | POA: Diagnosis not present

## 2022-06-22 DIAGNOSIS — R29898 Other symptoms and signs involving the musculoskeletal system: Secondary | ICD-10-CM | POA: Diagnosis not present

## 2022-06-22 DIAGNOSIS — I69351 Hemiplegia and hemiparesis following cerebral infarction affecting right dominant side: Secondary | ICD-10-CM | POA: Diagnosis not present

## 2022-06-22 DIAGNOSIS — G959 Disease of spinal cord, unspecified: Secondary | ICD-10-CM | POA: Diagnosis not present

## 2022-06-22 DIAGNOSIS — M5134 Other intervertebral disc degeneration, thoracic region: Secondary | ICD-10-CM | POA: Diagnosis not present

## 2022-06-22 DIAGNOSIS — M5136 Other intervertebral disc degeneration, lumbar region: Secondary | ICD-10-CM | POA: Diagnosis not present

## 2022-06-22 DIAGNOSIS — M5030 Other cervical disc degeneration, unspecified cervical region: Secondary | ICD-10-CM | POA: Diagnosis not present

## 2022-06-27 DIAGNOSIS — G959 Disease of spinal cord, unspecified: Secondary | ICD-10-CM | POA: Diagnosis not present

## 2022-06-28 DIAGNOSIS — R531 Weakness: Secondary | ICD-10-CM | POA: Diagnosis not present

## 2022-06-28 DIAGNOSIS — M5136 Other intervertebral disc degeneration, lumbar region: Secondary | ICD-10-CM | POA: Diagnosis not present

## 2022-06-28 DIAGNOSIS — R29898 Other symptoms and signs involving the musculoskeletal system: Secondary | ICD-10-CM | POA: Diagnosis not present

## 2022-06-28 DIAGNOSIS — M5030 Other cervical disc degeneration, unspecified cervical region: Secondary | ICD-10-CM | POA: Diagnosis not present

## 2022-06-28 DIAGNOSIS — I69351 Hemiplegia and hemiparesis following cerebral infarction affecting right dominant side: Secondary | ICD-10-CM | POA: Diagnosis not present

## 2022-06-28 DIAGNOSIS — D509 Iron deficiency anemia, unspecified: Secondary | ICD-10-CM | POA: Diagnosis not present

## 2022-06-28 DIAGNOSIS — I10 Essential (primary) hypertension: Secondary | ICD-10-CM | POA: Diagnosis not present

## 2022-06-28 DIAGNOSIS — M5134 Other intervertebral disc degeneration, thoracic region: Secondary | ICD-10-CM | POA: Diagnosis not present

## 2022-06-28 DIAGNOSIS — G959 Disease of spinal cord, unspecified: Secondary | ICD-10-CM | POA: Diagnosis not present

## 2022-06-28 DIAGNOSIS — M4807 Spinal stenosis, lumbosacral region: Secondary | ICD-10-CM | POA: Diagnosis not present

## 2022-07-03 ENCOUNTER — Ambulatory Visit: Payer: Self-pay | Admitting: *Deleted

## 2022-07-03 DIAGNOSIS — I69351 Hemiplegia and hemiparesis following cerebral infarction affecting right dominant side: Secondary | ICD-10-CM | POA: Diagnosis not present

## 2022-07-03 DIAGNOSIS — G959 Disease of spinal cord, unspecified: Secondary | ICD-10-CM | POA: Diagnosis not present

## 2022-07-03 DIAGNOSIS — R531 Weakness: Secondary | ICD-10-CM | POA: Diagnosis not present

## 2022-07-03 DIAGNOSIS — M4807 Spinal stenosis, lumbosacral region: Secondary | ICD-10-CM | POA: Diagnosis not present

## 2022-07-03 DIAGNOSIS — M5134 Other intervertebral disc degeneration, thoracic region: Secondary | ICD-10-CM | POA: Diagnosis not present

## 2022-07-03 DIAGNOSIS — D509 Iron deficiency anemia, unspecified: Secondary | ICD-10-CM | POA: Diagnosis not present

## 2022-07-03 DIAGNOSIS — M5030 Other cervical disc degeneration, unspecified cervical region: Secondary | ICD-10-CM | POA: Diagnosis not present

## 2022-07-03 DIAGNOSIS — R29898 Other symptoms and signs involving the musculoskeletal system: Secondary | ICD-10-CM | POA: Diagnosis not present

## 2022-07-03 DIAGNOSIS — I10 Essential (primary) hypertension: Secondary | ICD-10-CM | POA: Diagnosis not present

## 2022-07-03 DIAGNOSIS — M5136 Other intervertebral disc degeneration, lumbar region: Secondary | ICD-10-CM | POA: Diagnosis not present

## 2022-07-03 NOTE — Telephone Encounter (Signed)
Attempted to return call.  Voice mailbox is full and not accepting messages at this time.  Will try again later.

## 2022-07-03 NOTE — Telephone Encounter (Signed)
Message from [place] sent at 07/03/2022  1:28 PM EDT  Summary: dark yellowish fluid leaking from vagina   Caller states pt has dark yellowish fluid leaking from her vagina, caller states fluid has a smell   Caller is not on dpr, caller is w/ pt   Caller states pt had a stroke and he does majority of the communicating for her   Please assist further           Call History   Type Contact Phone/Fax User  07/03/2022 01:24 PM EDT Phone (Incoming) [place] (Emergency Contact) [phone] Sherron Flemings

## 2022-07-03 NOTE — Telephone Encounter (Signed)
     Chief Complaint: Dark, yellow urine, pt. Is incontinent. Odor to urine Symptoms: Above Frequency: 1 week ago Pertinent Negatives: Patient denies fever Disposition: '[x]'$ ED /'[]'$ Urgent Care (no appt availability in office) / '[]'$ Appointment(In office/virtual)/ '[]'$  [place] [facility]/ '[]'$ Home Care/ '[]'$ Refused Recommended Disposition /'[]'$ [place] [facility]/ '[]'$  Follow-up with PCP Additional Notes: Son will have EMS come for transportation. Pt. Is bedbound.  Reason for Disposition  Sounds like a life-threatening emergency to the triager  Answer Assessment - Initial Assessment Questions 1. SYMPTOM: "What's the main symptom you're concerned about?" (e.g., frequency, incontinence)     Dark yellow 2. ONSET: "When did the    start?"     1 week ago 3. PAIN: "Is there any pain?" If Yes, ask: "How bad is it?" (Scale: 1-10; mild, moderate, severe)     No 4. CAUSE: "What do you think is causing the symptoms?"     Infection 5. OTHER SYMPTOMS: "Do you have any other symptoms?" (e.g., blood in urine, fever, flank pain, pain with urination)     Strong odor 6. PREGNANCY: "Is there any chance you are pregnant?" "When was your last menstrual period?"     No  Protocols used: Urinary Symptoms-A-AH

## 2022-07-04 DIAGNOSIS — G959 Disease of spinal cord, unspecified: Secondary | ICD-10-CM | POA: Diagnosis not present

## 2022-07-04 DIAGNOSIS — I10 Essential (primary) hypertension: Secondary | ICD-10-CM | POA: Diagnosis not present

## 2022-07-04 DIAGNOSIS — M5136 Other intervertebral disc degeneration, lumbar region: Secondary | ICD-10-CM | POA: Diagnosis not present

## 2022-07-04 DIAGNOSIS — R531 Weakness: Secondary | ICD-10-CM | POA: Diagnosis not present

## 2022-07-04 DIAGNOSIS — R29898 Other symptoms and signs involving the musculoskeletal system: Secondary | ICD-10-CM | POA: Diagnosis not present

## 2022-07-04 DIAGNOSIS — D509 Iron deficiency anemia, unspecified: Secondary | ICD-10-CM | POA: Diagnosis not present

## 2022-07-04 DIAGNOSIS — M5030 Other cervical disc degeneration, unspecified cervical region: Secondary | ICD-10-CM | POA: Diagnosis not present

## 2022-07-04 DIAGNOSIS — I69351 Hemiplegia and hemiparesis following cerebral infarction affecting right dominant side: Secondary | ICD-10-CM | POA: Diagnosis not present

## 2022-07-04 DIAGNOSIS — M4807 Spinal stenosis, lumbosacral region: Secondary | ICD-10-CM | POA: Diagnosis not present

## 2022-07-04 DIAGNOSIS — M5134 Other intervertebral disc degeneration, thoracic region: Secondary | ICD-10-CM | POA: Diagnosis not present

## 2022-07-05 DIAGNOSIS — N1831 Chronic kidney disease, stage 3a: Secondary | ICD-10-CM | POA: Diagnosis not present

## 2022-07-05 DIAGNOSIS — Z743 Need for continuous supervision: Secondary | ICD-10-CM | POA: Diagnosis not present

## 2022-07-05 DIAGNOSIS — R000 Tachycardia, unspecified: Secondary | ICD-10-CM | POA: Diagnosis not present

## 2022-07-05 DIAGNOSIS — D509 Iron deficiency anemia, unspecified: Secondary | ICD-10-CM | POA: Diagnosis not present

## 2022-07-05 DIAGNOSIS — R7981 Abnormal blood-gas level: Secondary | ICD-10-CM | POA: Diagnosis not present

## 2022-07-05 DIAGNOSIS — Z0001 Encounter for general adult medical examination with abnormal findings: Secondary | ICD-10-CM | POA: Diagnosis not present

## 2022-07-05 DIAGNOSIS — R7301 Impaired fasting glucose: Secondary | ICD-10-CM | POA: Diagnosis not present

## 2022-07-05 DIAGNOSIS — J302 Other seasonal allergic rhinitis: Secondary | ICD-10-CM | POA: Diagnosis not present

## 2022-07-05 DIAGNOSIS — M25561 Pain in right knee: Secondary | ICD-10-CM | POA: Diagnosis not present

## 2022-07-05 DIAGNOSIS — Z8673 Personal history of transient ischemic attack (TIA), and cerebral infarction without residual deficits: Secondary | ICD-10-CM | POA: Diagnosis not present

## 2022-07-05 DIAGNOSIS — E782 Mixed hyperlipidemia: Secondary | ICD-10-CM | POA: Diagnosis not present

## 2022-07-05 DIAGNOSIS — I10 Essential (primary) hypertension: Secondary | ICD-10-CM | POA: Diagnosis not present

## 2022-07-10 DIAGNOSIS — I69351 Hemiplegia and hemiparesis following cerebral infarction affecting right dominant side: Secondary | ICD-10-CM | POA: Diagnosis not present

## 2022-07-10 DIAGNOSIS — R531 Weakness: Secondary | ICD-10-CM | POA: Diagnosis not present

## 2022-07-10 DIAGNOSIS — D509 Iron deficiency anemia, unspecified: Secondary | ICD-10-CM | POA: Diagnosis not present

## 2022-07-10 DIAGNOSIS — R29898 Other symptoms and signs involving the musculoskeletal system: Secondary | ICD-10-CM | POA: Diagnosis not present

## 2022-07-10 DIAGNOSIS — M5134 Other intervertebral disc degeneration, thoracic region: Secondary | ICD-10-CM | POA: Diagnosis not present

## 2022-07-10 DIAGNOSIS — I10 Essential (primary) hypertension: Secondary | ICD-10-CM | POA: Diagnosis not present

## 2022-07-10 DIAGNOSIS — M4807 Spinal stenosis, lumbosacral region: Secondary | ICD-10-CM | POA: Diagnosis not present

## 2022-07-10 DIAGNOSIS — M5030 Other cervical disc degeneration, unspecified cervical region: Secondary | ICD-10-CM | POA: Diagnosis not present

## 2022-07-10 DIAGNOSIS — M5136 Other intervertebral disc degeneration, lumbar region: Secondary | ICD-10-CM | POA: Diagnosis not present

## 2022-07-10 DIAGNOSIS — G959 Disease of spinal cord, unspecified: Secondary | ICD-10-CM | POA: Diagnosis not present

## 2022-07-27 DIAGNOSIS — G959 Disease of spinal cord, unspecified: Secondary | ICD-10-CM | POA: Diagnosis not present

## 2022-08-27 DIAGNOSIS — G959 Disease of spinal cord, unspecified: Secondary | ICD-10-CM | POA: Diagnosis not present

## 2022-09-26 DIAGNOSIS — G959 Disease of spinal cord, unspecified: Secondary | ICD-10-CM | POA: Diagnosis not present

## 2022-10-27 DIAGNOSIS — G959 Disease of spinal cord, unspecified: Secondary | ICD-10-CM | POA: Diagnosis not present

## 2023-08-23 ENCOUNTER — Other Ambulatory Visit (HOSPITAL_COMMUNITY): Payer: Self-pay | Admitting: Family Medicine

## 2023-08-23 DIAGNOSIS — I129 Hypertensive chronic kidney disease with stage 1 through stage 4 chronic kidney disease, or unspecified chronic kidney disease: Secondary | ICD-10-CM | POA: Diagnosis not present

## 2023-08-23 DIAGNOSIS — R062 Wheezing: Secondary | ICD-10-CM | POA: Diagnosis not present

## 2023-08-23 DIAGNOSIS — R69 Illness, unspecified: Secondary | ICD-10-CM | POA: Diagnosis not present

## 2023-08-23 DIAGNOSIS — N1831 Chronic kidney disease, stage 3a: Secondary | ICD-10-CM | POA: Diagnosis not present

## 2023-08-23 DIAGNOSIS — Z1231 Encounter for screening mammogram for malignant neoplasm of breast: Secondary | ICD-10-CM

## 2023-08-23 DIAGNOSIS — R5381 Other malaise: Secondary | ICD-10-CM | POA: Diagnosis not present

## 2023-08-23 DIAGNOSIS — R262 Difficulty in walking, not elsewhere classified: Secondary | ICD-10-CM | POA: Diagnosis not present

## 2023-08-23 DIAGNOSIS — D509 Iron deficiency anemia, unspecified: Secondary | ICD-10-CM | POA: Diagnosis not present

## 2023-08-23 DIAGNOSIS — Z743 Need for continuous supervision: Secondary | ICD-10-CM | POA: Diagnosis not present

## 2023-08-23 DIAGNOSIS — M25561 Pain in right knee: Secondary | ICD-10-CM | POA: Diagnosis not present

## 2023-08-23 DIAGNOSIS — E782 Mixed hyperlipidemia: Secondary | ICD-10-CM | POA: Diagnosis not present

## 2023-08-23 DIAGNOSIS — R531 Weakness: Secondary | ICD-10-CM | POA: Diagnosis not present

## 2023-08-23 DIAGNOSIS — J302 Other seasonal allergic rhinitis: Secondary | ICD-10-CM | POA: Diagnosis not present

## 2023-08-23 DIAGNOSIS — R7301 Impaired fasting glucose: Secondary | ICD-10-CM | POA: Diagnosis not present

## 2023-08-23 DIAGNOSIS — Z23 Encounter for immunization: Secondary | ICD-10-CM | POA: Diagnosis not present

## 2024-03-18 ENCOUNTER — Other Ambulatory Visit (HOSPITAL_COMMUNITY): Payer: Self-pay | Admitting: Internal Medicine

## 2024-03-18 DIAGNOSIS — Z1231 Encounter for screening mammogram for malignant neoplasm of breast: Secondary | ICD-10-CM

## 2024-03-18 DIAGNOSIS — Z780 Asymptomatic menopausal state: Secondary | ICD-10-CM
